# Patient Record
Sex: Male | Born: 1955 | Race: White | Hispanic: No | Marital: Single | Smoking: Never smoker
Health system: Southern US, Community
[De-identification: ages and names within clinical notes are randomized; demographics above are authoritative.]

## PROBLEM LIST (undated history)

## (undated) DIAGNOSIS — G919 Hydrocephalus, unspecified: Secondary | ICD-10-CM

## (undated) DIAGNOSIS — Q423 Congenital absence, atresia and stenosis of anus without fistula: Secondary | ICD-10-CM

## (undated) DIAGNOSIS — F329 Major depressive disorder, single episode, unspecified: Secondary | ICD-10-CM

## (undated) DIAGNOSIS — F32A Depression, unspecified: Secondary | ICD-10-CM

## (undated) DIAGNOSIS — J189 Pneumonia, unspecified organism: Secondary | ICD-10-CM

## (undated) DIAGNOSIS — R011 Cardiac murmur, unspecified: Secondary | ICD-10-CM

## (undated) DIAGNOSIS — Q7649 Other congenital malformations of spine, not associated with scoliosis: Secondary | ICD-10-CM

## (undated) DIAGNOSIS — F419 Anxiety disorder, unspecified: Secondary | ICD-10-CM

## (undated) DIAGNOSIS — Q428 Congenital absence, atresia and stenosis of other parts of large intestine: Secondary | ICD-10-CM

## (undated) DIAGNOSIS — Q421 Congenital absence, atresia and stenosis of rectum without fistula: Secondary | ICD-10-CM

## (undated) DIAGNOSIS — M4120 Other idiopathic scoliosis, site unspecified: Secondary | ICD-10-CM

## (undated) DIAGNOSIS — Z8719 Personal history of other diseases of the digestive system: Secondary | ICD-10-CM

## (undated) DIAGNOSIS — K599 Functional intestinal disorder, unspecified: Secondary | ICD-10-CM

## (undated) HISTORY — DX: Major depressive disorder, single episode, unspecified: F32.9

## (undated) HISTORY — DX: Hydrocephalus, unspecified: G91.9

## (undated) HISTORY — DX: Anxiety disorder, unspecified: F41.9

## (undated) HISTORY — DX: Other congenital malformations of spine, not associated with scoliosis: Q76.49

## (undated) HISTORY — PX: OTHER SURGICAL HISTORY: SHX169

## (undated) HISTORY — PX: TONSILLECTOMY: SUR1361

## (undated) HISTORY — DX: Congenital absence, atresia and stenosis of anus without fistula: Q42.3

## (undated) HISTORY — DX: Congenital absence, atresia and stenosis of rectum without fistula: Q42.1

## (undated) HISTORY — DX: Congenital absence, atresia and stenosis of other parts of large intestine: Q42.8

---

## 1978-05-25 HISTORY — PX: HERNIA REPAIR: SHX51

## 1998-08-14 ENCOUNTER — Emergency Department (HOSPITAL_COMMUNITY): Admission: EM | Admit: 1998-08-14 | Discharge: 1998-08-14 | Payer: Self-pay | Admitting: Emergency Medicine

## 1999-12-21 ENCOUNTER — Encounter: Payer: Self-pay | Admitting: Neurological Surgery

## 1999-12-21 ENCOUNTER — Ambulatory Visit (HOSPITAL_COMMUNITY): Admission: RE | Admit: 1999-12-21 | Discharge: 1999-12-21 | Payer: Self-pay | Admitting: Neurological Surgery

## 1999-12-22 ENCOUNTER — Encounter: Payer: Self-pay | Admitting: Neurological Surgery

## 2000-06-09 ENCOUNTER — Encounter: Payer: Self-pay | Admitting: Orthopedic Surgery

## 2000-06-09 ENCOUNTER — Ambulatory Visit (HOSPITAL_COMMUNITY): Admission: RE | Admit: 2000-06-09 | Discharge: 2000-06-09 | Payer: Self-pay | Admitting: Orthopedic Surgery

## 2000-06-10 ENCOUNTER — Encounter: Payer: Self-pay | Admitting: Orthopedic Surgery

## 2000-06-10 ENCOUNTER — Ambulatory Visit (HOSPITAL_COMMUNITY): Admission: RE | Admit: 2000-06-10 | Discharge: 2000-06-10 | Payer: Self-pay | Admitting: Orthopedic Surgery

## 2000-06-17 ENCOUNTER — Inpatient Hospital Stay (HOSPITAL_COMMUNITY): Admission: EM | Admit: 2000-06-17 | Discharge: 2000-06-19 | Payer: Self-pay | Admitting: Emergency Medicine

## 2000-06-17 ENCOUNTER — Encounter (INDEPENDENT_AMBULATORY_CARE_PROVIDER_SITE_OTHER): Payer: Self-pay | Admitting: *Deleted

## 2000-06-17 ENCOUNTER — Encounter: Payer: Self-pay | Admitting: Internal Medicine

## 2000-06-18 ENCOUNTER — Encounter: Payer: Self-pay | Admitting: Gastroenterology

## 2000-06-19 ENCOUNTER — Encounter: Payer: Self-pay | Admitting: Internal Medicine

## 2000-06-19 ENCOUNTER — Encounter (INDEPENDENT_AMBULATORY_CARE_PROVIDER_SITE_OTHER): Payer: Self-pay | Admitting: *Deleted

## 2002-07-26 ENCOUNTER — Emergency Department (HOSPITAL_COMMUNITY): Admission: EM | Admit: 2002-07-26 | Discharge: 2002-07-26 | Payer: Self-pay | Admitting: Emergency Medicine

## 2003-05-03 ENCOUNTER — Ambulatory Visit (HOSPITAL_COMMUNITY): Admission: RE | Admit: 2003-05-03 | Discharge: 2003-05-03 | Payer: Self-pay | Admitting: *Deleted

## 2003-05-10 ENCOUNTER — Encounter (INDEPENDENT_AMBULATORY_CARE_PROVIDER_SITE_OTHER): Payer: Self-pay | Admitting: *Deleted

## 2003-05-10 ENCOUNTER — Encounter: Admission: RE | Admit: 2003-05-10 | Discharge: 2003-05-10 | Payer: Self-pay | Admitting: *Deleted

## 2003-05-15 ENCOUNTER — Ambulatory Visit (HOSPITAL_COMMUNITY): Admission: RE | Admit: 2003-05-15 | Discharge: 2003-05-15 | Payer: Self-pay | Admitting: *Deleted

## 2003-05-15 ENCOUNTER — Encounter (INDEPENDENT_AMBULATORY_CARE_PROVIDER_SITE_OTHER): Payer: Self-pay | Admitting: *Deleted

## 2003-05-15 HISTORY — PX: UPPER GASTROINTESTINAL ENDOSCOPY: SHX188

## 2004-11-11 ENCOUNTER — Emergency Department (HOSPITAL_COMMUNITY): Admission: EM | Admit: 2004-11-11 | Discharge: 2004-11-11 | Payer: Self-pay | Admitting: Emergency Medicine

## 2006-08-05 ENCOUNTER — Ambulatory Visit: Payer: Self-pay | Admitting: Internal Medicine

## 2006-08-23 ENCOUNTER — Ambulatory Visit (HOSPITAL_COMMUNITY): Admission: RE | Admit: 2006-08-23 | Discharge: 2006-08-23 | Payer: Self-pay | Admitting: Internal Medicine

## 2006-09-16 ENCOUNTER — Ambulatory Visit: Payer: Self-pay | Admitting: Internal Medicine

## 2006-09-28 ENCOUNTER — Observation Stay (HOSPITAL_COMMUNITY): Admission: AD | Admit: 2006-09-28 | Discharge: 2006-09-29 | Payer: Self-pay | Admitting: Cardiology

## 2006-09-28 ENCOUNTER — Encounter (INDEPENDENT_AMBULATORY_CARE_PROVIDER_SITE_OTHER): Payer: Self-pay | Admitting: *Deleted

## 2006-09-29 ENCOUNTER — Encounter (INDEPENDENT_AMBULATORY_CARE_PROVIDER_SITE_OTHER): Payer: Self-pay | Admitting: *Deleted

## 2006-10-05 ENCOUNTER — Ambulatory Visit: Payer: Self-pay | Admitting: Internal Medicine

## 2006-11-08 ENCOUNTER — Encounter (INDEPENDENT_AMBULATORY_CARE_PROVIDER_SITE_OTHER): Payer: Self-pay | Admitting: *Deleted

## 2006-11-08 ENCOUNTER — Inpatient Hospital Stay (HOSPITAL_COMMUNITY): Admission: EM | Admit: 2006-11-08 | Discharge: 2006-11-09 | Payer: Self-pay | Admitting: Internal Medicine

## 2006-11-08 HISTORY — PX: COLONOSCOPY: SHX174

## 2006-11-09 ENCOUNTER — Encounter: Payer: Self-pay | Admitting: Internal Medicine

## 2006-11-09 ENCOUNTER — Encounter (INDEPENDENT_AMBULATORY_CARE_PROVIDER_SITE_OTHER): Payer: Self-pay | Admitting: *Deleted

## 2006-11-15 ENCOUNTER — Ambulatory Visit: Payer: Self-pay | Admitting: Internal Medicine

## 2007-01-20 ENCOUNTER — Ambulatory Visit: Payer: Self-pay | Admitting: Internal Medicine

## 2007-02-14 ENCOUNTER — Ambulatory Visit: Payer: Self-pay | Admitting: Internal Medicine

## 2007-02-14 LAB — CONVERTED CEMR LAB
ALT: 22 units/L (ref 0–53)
AST: 25 units/L (ref 0–37)
Albumin: 4.2 g/dL (ref 3.5–5.2)
Alkaline Phosphatase: 57 units/L (ref 39–117)
BUN: 10 mg/dL (ref 6–23)
Basophils Absolute: 0 10*3/uL (ref 0.0–0.1)
Basophils Relative: 0.6 % (ref 0.0–1.0)
Bilirubin, Direct: 0.1 mg/dL (ref 0.0–0.3)
CO2: 33 meq/L — ABNORMAL HIGH (ref 19–32)
Calcium: 9.7 mg/dL (ref 8.4–10.5)
Chloride: 101 meq/L (ref 96–112)
Creatinine, Ser: 0.9 mg/dL (ref 0.4–1.5)
Eosinophils Absolute: 0.1 10*3/uL (ref 0.0–0.6)
Eosinophils Relative: 2.9 % (ref 0.0–5.0)
GFR calc Af Amer: 115 mL/min
GFR calc non Af Amer: 95 mL/min
Glucose, Bld: 136 mg/dL — ABNORMAL HIGH (ref 70–99)
HCT: 38.2 % — ABNORMAL LOW (ref 39.0–52.0)
Hemoglobin: 13 g/dL (ref 13.0–17.0)
Lymphocytes Relative: 18.5 % (ref 12.0–46.0)
MCHC: 34 g/dL (ref 30.0–36.0)
MCV: 90.6 fL (ref 78.0–100.0)
Monocytes Absolute: 0.2 10*3/uL (ref 0.2–0.7)
Monocytes Relative: 4.6 % (ref 3.0–11.0)
Neutro Abs: 3.2 10*3/uL (ref 1.4–7.7)
Neutrophils Relative %: 73.4 % (ref 43.0–77.0)
Platelets: 281 10*3/uL (ref 150–400)
Potassium: 3.7 meq/L (ref 3.5–5.1)
RBC: 4.21 M/uL — ABNORMAL LOW (ref 4.22–5.81)
RDW: 13 % (ref 11.5–14.6)
Sodium: 141 meq/L (ref 135–145)
Tissue Transglutaminase Ab, IgA: 0.2 units (ref <7)
Total Bilirubin: 0.5 mg/dL (ref 0.3–1.2)
Total Protein: 6.8 g/dL (ref 6.0–8.3)
WBC: 4.3 10*3/uL — ABNORMAL LOW (ref 4.5–10.5)

## 2007-02-17 ENCOUNTER — Encounter: Payer: Self-pay | Admitting: Internal Medicine

## 2007-08-19 DIAGNOSIS — Q421 Congenital absence, atresia and stenosis of rectum without fistula: Secondary | ICD-10-CM

## 2007-08-19 DIAGNOSIS — R159 Full incontinence of feces: Secondary | ICD-10-CM | POA: Insufficient documentation

## 2007-08-19 DIAGNOSIS — Q428 Congenital absence, atresia and stenosis of other parts of large intestine: Secondary | ICD-10-CM

## 2007-08-19 DIAGNOSIS — K279 Peptic ulcer, site unspecified, unspecified as acute or chronic, without hemorrhage or perforation: Secondary | ICD-10-CM | POA: Insufficient documentation

## 2007-08-19 DIAGNOSIS — Z87898 Personal history of other specified conditions: Secondary | ICD-10-CM

## 2007-08-19 DIAGNOSIS — M129 Arthropathy, unspecified: Secondary | ICD-10-CM | POA: Insufficient documentation

## 2007-08-19 DIAGNOSIS — Q423 Congenital absence, atresia and stenosis of anus without fistula: Secondary | ICD-10-CM

## 2007-08-19 HISTORY — DX: Congenital absence, atresia and stenosis of rectum without fistula: Q42.1

## 2008-04-16 ENCOUNTER — Telehealth: Payer: Self-pay | Admitting: Internal Medicine

## 2010-01-22 ENCOUNTER — Telehealth: Payer: Self-pay | Admitting: Internal Medicine

## 2010-01-22 ENCOUNTER — Encounter: Payer: Self-pay | Admitting: Internal Medicine

## 2010-03-14 DIAGNOSIS — K922 Gastrointestinal hemorrhage, unspecified: Secondary | ICD-10-CM | POA: Insufficient documentation

## 2010-03-14 DIAGNOSIS — F32A Depression, unspecified: Secondary | ICD-10-CM

## 2010-03-14 DIAGNOSIS — F419 Anxiety disorder, unspecified: Secondary | ICD-10-CM

## 2010-03-14 DIAGNOSIS — M4120 Other idiopathic scoliosis, site unspecified: Secondary | ICD-10-CM | POA: Insufficient documentation

## 2010-03-14 DIAGNOSIS — F329 Major depressive disorder, single episode, unspecified: Secondary | ICD-10-CM

## 2010-03-14 HISTORY — DX: Anxiety disorder, unspecified: F41.9

## 2010-03-14 HISTORY — DX: Depression, unspecified: F32.A

## 2010-03-20 ENCOUNTER — Ambulatory Visit: Payer: Self-pay | Admitting: Internal Medicine

## 2010-03-21 ENCOUNTER — Telehealth (INDEPENDENT_AMBULATORY_CARE_PROVIDER_SITE_OTHER): Payer: Self-pay | Admitting: *Deleted

## 2010-03-26 ENCOUNTER — Telehealth (INDEPENDENT_AMBULATORY_CARE_PROVIDER_SITE_OTHER): Payer: Self-pay | Admitting: *Deleted

## 2010-06-20 ENCOUNTER — Ambulatory Visit: Admit: 2010-06-20 | Payer: Self-pay | Admitting: Internal Medicine

## 2010-06-26 NOTE — Discharge Summary (Signed)
Summary: GI BLEED                         [facility]  Patient:    [name], [name]                      MRN: [id number] Adm. Date:  [id number] Disc. Date: [id number] Attending:  Vashti Hey                           Discharge Summary  FINAL DIAGNOSES: 1. Acute upper gastrointestinal bleeding from presumed gastric or duodenal    sores, not identified on endoscopy. 2. Headaches, probably due to volume depletion. 3. Anemia due to gastrointestinal bleeding. 4. Tachyarrhythmia due to volume depletion. 5. Idiopathic scoliosis.  HISTORY OF PRESENT ILLNESS:  This 55 year old single white male presented with a two day history of headache, shortness of breath, rapid heartbeat, and black tarry stools.  There was one episode of vomiting of clear liquid without blood.  He had diminished appetite.  He had been using some nonsteroidal anti-inflammatory product, in the form of ibuprofen, two tablets every two days for chronic neck pain and stiffness.  He had no excessive alcohol intake. On presentation his hemoglobin was low at 5.8 g, his stools were positive for occult blood, and he was admitted for further evaluation and treatment.  HOSPITAL COURSE:  The patient was immediately transfused with two units of packed cells.  His hemoglobin rose modestly, and a third unit was administered.  He was mildly orthostatic when he was up and about.  A GI consultation was performed by Dr. Melvia Heaps and associated of [facility].  They concurred with the indication for upper endoscopy. This was accomplished on the morning after admission.  His esophagogastroduodenoscopy was negative, and the consultant recommended a small-bowel follow-through.  This was accomplished on the day of discharge, and likewise was unremarkable.  The consultant recommended that colonoscopy be performed as an outpatient at a later date.  LABORATORY DATA:  Urinalysis was unremarkable.  Specific  gravity 1.010.  His hepatic function panel was essentially within normal limits.  PT and PTT normal.  His blood type was O Rh+ with negative antibody screen.  Initial hemoglobin was reported at 5.8, later rising to 8 after 2 units of packed cells, and up to 9.1 after the third unit.  White cell count was normal.  RDW was elevated at 15.3.  Comprehensive metabolic panel was normal except for slightly low potassium at 3.3, total protein of 5.1, albumin 3.1.  TSH level was normal at 4.32.  EKG showed normal sinus rhythm with a rate of 75 beats per minute on June 17, 2000 at 1200 hours.  His two-view chest film showed no active disease. There was a scoliosis of the thoracolumbar spine with convexity to the left in the mid thoracic spine, pulmonary vascularity was deemed normal.  The small-bowel follow-through x-ray report is not available on the chart at this time.  RECOMMENDATIONS:  The patient was advised to remain on Protonix 40 mg daily. To avoid aspirin and NSAID products.  To follow up in the office in one week or as needed.  He will have a colonoscopy as an outpatient at some point in the near future.  He is to report any further weakness, dizziness, or any signs of black stool.  NOSOCOMIAL INFECTIONS:  None.  CONDITION ON DISCHARGE:  Improved.  DD:  06/22/00 TD:  06/22/00 Job: [id number]

## 2010-06-26 NOTE — Discharge Summary (Signed)
Summary: Fecal incontinence and Constipation  NAME:  [name], [name]               ACCOUNT NO.:  [id number]      MEDICAL RECORD NO.:  [id number]          PATIENT TYPE:  INP      LOCATION:  1615                         FACILITY:  [facility]      PHYSICIAN:  Hedwig Morton. Juanda Chance, MD     DATE OF BIRTH:  [birth year]      DATE OF ADMISSION:  11/08/2006   DATE OF DISCHARGE:  11/09/2006                                  DISCHARGE SUMMARY      ADMITTING DIAGNOSES.:   29. A 55 year old male with fecal incontinence and constipation,       severe.   2. Weight loss.   3. History of imperforate anus surgically corrected as infant.      DISCHARGE DIAGNOSES:   110. A 55 year old male with fecal incontinence and severe obstipation       with colonoscopy showing an incompetent neo-anal sphincter,       otherwise, negative exam.   2. Other diagnoses as listed above.      CONSULTATIONS:  None.      PROCEDURES:  Colonoscopy per Dr. Lina Sar.      BRIEF HISTORY:  [name] is a 55 year old white male with a long history   of colonic inertia, fecal incontinence and progressively more severe   obstipation.  He has had some weight loss which has been questioned   whether this may be self-imposed.  He has history of imperforate anus   which was corrected as an infant.  He was admitted for inpatient prep as   it was felt that he would not prep well at home.      LABORATORY STUDIES:  On admission, WBC of 4.5, hemoglobin 13.9,   hematocrit of 41.6, MCV of 86.5.  Electrolytes within normal limits,   creatinine 1.04.  Liver function studies normal.  __________ was   negative.      HOSPITAL COURSE:  The patient was admitted to the service of Dr. Lina Sar for observation to undergo inpatient bowel prep and then   colonoscopy.  He was prepped with GoLYTELY on the day of admission.   Tolerated the prep well, and then had a colonoscopy on June 17.  This   was felt to be a mediocre prep, was a negative exam.  It did  show some   dilation of the transverse and right colon and an incompetent neo-anal   sphincter.  The patient was discharged to home on June 17 in a stable   condition.  He was to have evaluation at the surgical clinic at [facility] for   bowel incontinence.      MEDICATION REGIMEN:  None.      FOLLOW UP:  Follow up with Dr. Lina Sar as an outpatient.      Dictation taken entirely from the handwritten notes of Dr. Lina Sar.               Amy Mansfield, PA-C               Dora M.  Juanda Chance, MD   Electronically Signed         AE/MEDQ  D:  11/22/2006  T:  11/22/2006  Job:  [id number]      cc:   Janae Bridgeman. Eloise Harman., M.D.   Fax: [phone]

## 2010-06-26 NOTE — Progress Notes (Signed)
Summary: Returned Receipt Received Verifying Letter Delivered to Patient  Returned receipt received verifing delivery of letter. Vara Guardian  March 26, 2010 10:03 AM

## 2010-06-26 NOTE — Letter (Signed)
Summary: New Patient letter  [facility]  [address] [place], [place] [address]   Phone: [phone]  Fax: [phone]       01/22/2010 MRN: [id number]  [name] [address] [place], [place]  [address]  Dear [name],  Welcome to the Gastroenterology Division at [facility].    You are scheduled to see Dr.  Juanda Chance on 03/20/10 at 8:45 a.m.  on the 3rd floor at [facility], [address]  We ask that you try to arrive at our office 15 minutes prior to your appointment time to allow for check-in.  We would like you to complete the enclosed self-administered evaluation form prior to your visit and bring it with you on the day of your appointment.  We will review it with you.  Also, please bring a complete list of all your medications or, if you prefer, bring the medication bottles and we will list them.  Please bring your insurance card so that we may make a copy of it.  If your insurance requires a referral to see a specialist, please bring your referral form from your primary care physician.  Co-payments are due at the time of your visit and may be paid by cash, check or credit card.     Your office visit will consist of a consult with your physician (includes a physical exam), any laboratory testing he/she may order, scheduling of any necessary diagnostic testing (e.g. x-ray, ultrasound, CT-scan), and scheduling of a procedure (e.g. Endoscopy, Colonoscopy) if required.  Please allow enough time on your schedule to allow for any/all of these possibilities.    If you cannot keep your appointment, please call [phone] to cancel or reschedule prior to your appointment date.  This allows [place] the opportunity to schedule an appointment for another patient in need of care.  If you do not cancel or reschedule by 5 p.m. the business day prior to your appointment date, you will be charged a $50.00 late cancellation/no-show fee.    Thank you for choosing  [facility] for your medical needs.  We appreciate the opportunity to care for you.  Please visit [place] at our website  to learn more about our practice.                     Sincerely,                                                             The Gastroenterology Division

## 2010-06-26 NOTE — Progress Notes (Signed)
Summary: Dismissal Letter Sent by Certified Mail  Dismissal Letter sent by certified mail. Vara Guardian  March 21, 2010 9:21 AM

## 2010-06-26 NOTE — Letter (Signed)
Summary: Dr Abbey Chatters Consult Letter  Dr Abbey Chatters Consult Letter   Imported By: Lamona Curl CMA (AAMA) 03/14/2010 17:10:08  _____________________________________________________________________  External Attachment:    Type:   Image     Comment:   External Document

## 2010-06-26 NOTE — Discharge Summary (Signed)
Summary: Colonic Inertia   NAME:  [name], [name]               ACCOUNT NO.:  [id number]   MEDICAL RECORD NO.:  [id number]          PATIENT TYPE:  INP   LOCATION:  6702                         FACILITY:  [facility]   PHYSICIAN:  Hedwig Morton. Juanda Chance, MD     DATE OF BIRTH:  1955-12-11   DATE OF ADMISSION:  09/28/2006  DATE OF DISCHARGE:  09/29/2006                               DISCHARGE SUMMARY   ADMITTING DIAGNOSES:  1. 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  2. Fecal incontinence associated with use of laxatives.  3. The patient born with imperforate anus.  At age of [age] underwent      surgical repair.  4. History of gastrointestinal bleed January 2002.  No source found at      upper endoscopy or small bowel follow-through.  Ibuprofen in use at      that time so question NSAID induced ulcers of non visualized GI      tract.  5. Upper endoscopy in 2004 by Dr. Sabino Gasser for investigation of      abdominal pain and GI bleeding.  Study was unremarkable.  6. Acute blood loss anemia associated with GI bleed in January 2002.      Required transfusion with 3 units packed red blood cells.  7. Idiopathic scoliosis.  8. Status post knee surgery in 1992 and 1994.  9. Status post left inguinal hernia repair in the 1980s.   DISCHARGE DIAGNOSES:  1. Fecal incontinence owing to use of laxatives to treat chronic      constipation associated with colon inertia.  The patient admitted      for colonoscopy prep but ended up refusing to take the prep and was      discharged to home the following morning.  2. Psychiatric illness, formal diagnosis not made, but the patient      would benefit from a psychiatric evaluation and treatment.  3. Weight loss associated with self-imposed anorexia in order to avoid      embarrassing episodes of fecal incontinence.  His albumin is normal      so he is technically not malnourished.   CONSULTATIONS:  None.   PROCEDURE:   None, the patient refused to undergo the prep for the  colonoscopy and, therefore, this study was cancelled.   BRIEF HISTORY:  [name] is a 55 year old white gentleman with  above-noted past medical history.  He has been treated for GI problems  by Dr. Virginia Rochester in the past.  His primary care doctor is Dr. Higinio Plan.  The patient has had lifelong constipation that he is requiring increased  use of laxatives.  Unfortunately the use of laxatives has led to fecal  incontinence when he finally does have a bowel movement.  He sought the  second opinion of Dr. Lina Sar.  He had had a Sitzmarks study in  March of 2008 that was markedly abnormal.  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  The patient has not  been able to undergo colonoscopy as an outpatient before because he has  difficulty with the bowel preparation.  This is both technically  difficult because it takes him awhile to have results from laxatives and  he requires prolonged use of laxatives in order to clean out the stools.  He also has social phobia's associated with incontinence and has not  been successful in prepping at home.  Therefore, Dr. Juanda Chance chose to  admit the patient for an inpatient preparation and colonoscopy.   The patient has been limiting the amount of food that he intakes because  when he eats food he ends up having to have a bowel movement and may or  may not have fecal incontinence.  Associated with this self-imposed food  restriction has been at least a 10-pound weight loss within the last few  months.   LABORATORY DATA:  PT 14.8, INR 1.1, PTT 35.  Sodium 142, potassium 3.7.  Chloride 105, CO2 31.  Glucose 95.  BUN 9, creatinine 0.92.  Total  bilirubin 0.5, alkaline phosphatase 62, AST 24, ALT 14.  Albumin 4.6.  TSH level 1.994, iron 80, total iron binding capacity 366, iron  saturation 22.  Prealbumin 33.1.  Salicylate level less than 4.  Urinalysis negative.   Hemoglobin 15.6, hematocrit 45.5.  MCV 85.8.  White blood cell count 5.8, platelet count 276.  Erythrocyte  sedimentation rate 2.  Acute abdominal series with chest films showed  large amount of fecal matter in the colon.  Undigested markers; one in  the proximal transverse and six in the mid descending colon.   HOSPITAL COURSE:  The patient was admitted in the afternoon of Sep 28, 2006.  Orders were written for his colon preparation and he underwent  lab work as well as abdominal films.  Lab work was normal.  The  abdominal films to no surprise showed lots of fecal matter and still  some retained sitz marks from the Sitzmarks study of August 23, 2006.   That evening the patient became a bit agitated.  He took one cup of prep  and then refused the rest.  He wanted to go home but was willing to stay  in the morning and discuss options with Dr. Juanda Chance.  That morning Dr.  Juanda Chance discussed the case with the patient.  The patient was uncertain  of what he wanted.  He asked if he could be discharged.  He was advised  by Dr. Juanda Chance that given the fact that he has refused to cooperate with  the attempt at colonoscopy, that he will be discharged from the [facility]  GI practice.  He understands this.  Follow-up will be with Dr. Lendell Caprice.   Dr. Juanda Chance did advise the patient that he should ask Dr. Lendell Caprice for  referral to a mental health professional.  She feels that he would  greatly benefit from counseling as well as perhaps appropriate  medications.   The patient may in the future be best served by an ostomy which would  resolve his problems of fecal incontinence.  However, the patient was  not in any state to agree to surgical evaluation and he certainly would  need colonoscopic evaluation before surgical referral could be  entertained.   CONDITION ON DISCHARGE:  Stable medically but psychiatrically anxious,  but of no threat to himself or others.  MEDICATIONS AT DISCHARGE:  None, he was  taking no medications at the  time of admission having stopped laxatives about 2 weeks prior  to this  admission.      Jennye Moccasin, PA-C      Hedwig Morton. Juanda Chance, MD  Electronically Signed    SG/MEDQ  D:  09/29/2006  T:  09/29/2006  Job:  [id number]   cc:   Hedwig Morton. Juanda Chance, MD  Janae Bridgeman Eloise Harman., M.D.

## 2010-06-26 NOTE — Op Note (Signed)
Summary: EGD  NAME:  [name], [name]                         ACCOUNT NO.:  [id number]   MEDICAL RECORD NO.:  [id number]                   PATIENT TYPE:  AMB   LOCATION:  ENDO                                 FACILITY:  [facility]   PHYSICIAN:  [name], M.D.                 DATE OF BIRTH:  03/29/1956   DATE OF PROCEDURE:  DATE OF DISCHARGE:                                 OPERATIVE REPORT   PROCEDURE:  Upper endoscopy.   INDICATIONS FOR PROCEDURE:  GI bleeding, abdominal pain.   ANESTHESIA:  Demerol 60, Versed 6 mg.   DESCRIPTION OF PROCEDURE:  With the patient mildly sedated in the left  lateral decubitus position, the Olympus videoscopic endoscope was inserted  in the mouth and passed under direct vision through the esophagus which  appeared normal into the stomach. The fundus, body, antrum, duodenal bulb  and second portion of the duodenum appeared normal. From this point, the  endoscope was slowly withdrawn taking circumferential views of the duodenal  mucosa until the endoscope was then pulled back in the stomach, placed in  retroflexion to view the stomach from below. The endoscope was then  straightened and withdrawn taking circumferential views of the remaining  gastric and esophageal mucosa. The patient's vital signs and pulse oximeter  remained stable. The patient tolerated the procedure well without apparent  complications.   FINDINGS:  Unremarkable endoscopic examination.   PLAN:  Have the patient followup with me.  We have ordered a chloride sweat  test and will make sure it gets done and have the patient followup  subsequent to that.                                               [name], M.D.    GMO/MEDQ  D:  05/15/2003  T:  05/16/2003  Job:  [id number]

## 2010-06-26 NOTE — Letter (Signed)
Summary: GASTROENTEROLOGY Discharge Letter  [facility]  [address] [place], [place] [address]   Phone: [phone]  Fax: [phone]            03/20/2010 MRN: [id number]  [name] [address] [place], [place]  [address]  Dear [name],   I find it necessary to inform you that I am no longer able to provide medical care to you due to your failure to show for multiple office appointments. As you know, you were previously discharged from our practice due to noncompliance with the suggested medical regimen. Your continued negative behavior is confirmation that I will be unable to have a positive physician/patient relationship with you. There will be no reversal of this decision.  Since your condition requires medical attention, I suggest that you place yourself under the care of another physician without delay. If you desire, I will be available for emergency care for 30 days after you receive this letter.  This should give you ample time to select a physician of your choice from the many competent providers in this area. You may want to call the local medical society or [facility]'s physician referral service [phone]) for their assistance in locating a new physician. With your written authorization, I will make a copy of your medical record available to your new physician.   Sincerely,    Hedwig Morton. Juanda Chance

## 2010-06-26 NOTE — Progress Notes (Signed)
Summary: Crohns flare up   Phone Note Call from Patient Call back at Home Phone [phone]   Call For: DR BRODIE Summary of Call: Is there something over the counter he can take for his Crohns flare up? Initial call taken by: Leanor Kail [facility],  January 22, 2010 1:40 PM  Follow-up for Phone Call        Patient  is asking for OTC recommendations for crohn's disease.  I advised the patient according to our records we have no dx of crohn's disease.  He reports abdominal discomfort.  Patient  is scheduled for an office visit with Dr Juanda Chance for 03/20/10 to discuss.  I will send him a new patient letter. Follow-up by: Darcey Nora RN, CGRN,  January 22, 2010 1:56 PM

## 2010-07-02 ENCOUNTER — Other Ambulatory Visit: Payer: Self-pay | Admitting: Internal Medicine

## 2010-07-02 ENCOUNTER — Encounter (INDEPENDENT_AMBULATORY_CARE_PROVIDER_SITE_OTHER): Payer: PRIVATE HEALTH INSURANCE | Admitting: Internal Medicine

## 2010-07-02 ENCOUNTER — Encounter: Payer: Self-pay | Admitting: Internal Medicine

## 2010-07-02 DIAGNOSIS — R0602 Shortness of breath: Secondary | ICD-10-CM | POA: Insufficient documentation

## 2010-07-02 DIAGNOSIS — R5383 Other fatigue: Secondary | ICD-10-CM

## 2010-07-02 DIAGNOSIS — R5381 Other malaise: Secondary | ICD-10-CM

## 2010-07-03 LAB — BASIC METABOLIC PANEL
BUN: 11 mg/dL (ref 6–23)
CO2: 31 mEq/L (ref 19–32)
Calcium: 9.4 mg/dL (ref 8.4–10.5)
Chloride: 99 mEq/L (ref 96–112)
Creatinine, Ser: 0.9 mg/dL (ref 0.4–1.5)
GFR: 94.85 mL/min (ref >60.00)
Glucose, Bld: 93 mg/dL (ref 70–99)
Potassium: 4 mEq/L (ref 3.5–5.1)
Sodium: 137 mEq/L (ref 135–145)

## 2010-07-03 LAB — CBC WITH DIFFERENTIAL/PLATELET
Basophils Absolute: 0 10*3/uL (ref 0.0–0.1)
Basophils Relative: 1.2 % (ref 0.0–3.0)
Eosinophils Absolute: 0.2 10*3/uL (ref 0.0–0.7)
Eosinophils Relative: 5.4 % — ABNORMAL HIGH (ref 0.0–5.0)
HCT: 44 % (ref 39.0–52.0)
Hemoglobin: 15 g/dL (ref 13.0–17.0)
Lymphocytes Relative: 55.7 % — ABNORMAL HIGH (ref 12.0–46.0)
Lymphs Abs: 1.7 10*3/uL (ref 0.7–4.0)
MCHC: 34.1 g/dL (ref 30.0–36.0)
MCV: 86.8 fl (ref 78.0–100.0)
Monocytes Absolute: 0.5 10*3/uL (ref 0.1–1.0)
Monocytes Relative: 15.2 % — ABNORMAL HIGH (ref 3.0–12.0)
Neutro Abs: 0.7 10*3/uL — ABNORMAL LOW (ref 1.4–7.7)
Neutrophils Relative %: 22.5 % — ABNORMAL LOW (ref 43.0–77.0)
Platelets: 241 10*3/uL (ref 150.0–400.0)
RBC: 5.07 Mil/uL (ref 4.22–5.81)
RDW: 13.9 % (ref 11.5–14.6)
WBC: 3 10*3/uL — ABNORMAL LOW (ref 4.5–10.5)

## 2010-07-03 LAB — HEPATIC FUNCTION PANEL
ALT: 20 U/L (ref 0–53)
AST: 28 U/L (ref 0–37)
Albumin: 4.3 g/dL (ref 3.5–5.2)
Alkaline Phosphatase: 56 U/L (ref 39–117)
Bilirubin, Direct: 0 mg/dL (ref 0.0–0.3)
Total Bilirubin: 0.2 mg/dL — ABNORMAL LOW (ref 0.3–1.2)
Total Protein: 6.9 g/dL (ref 6.0–8.3)

## 2010-07-03 LAB — TSH: TSH: 2.52 u[IU]/mL (ref 0.35–5.50)

## 2010-07-10 NOTE — Assessment & Plan Note (Signed)
Summary: new to est/weakness/ok per danielle/kn--called on 1/27 at 10:...   Vital Signs:  Patient profile:   55 year old male Height:      66.75 inches Weight:      131.38 pounds BMI:     20.81 Pulse rate:   62 / minute Pulse rhythm:   regular BP sitting:   120 / 64  (left arm) Cuff size:   regular  Vitals Entered By: Army Fossa CMA (July 02, 2010 3:08 PM) CC: New to est. Comments Had in infection in mouth was on an ATB- since then his energy level has been down. [facility]   History of Present Illness: new patient   He developed a dental infection, a tooth was pulled 2 weeks ago he was prescribed amoxicillin and he is doing better. Complaint today is that since the infection is just not back to normal. He feels slightly lightheaded and very fatigued. Fatigue is his main concern, review of systems essentially negative except for very mild difficulty breathing on and off, not necessarily related to exertion.  ROS No fevers occ. nausea, no vomiting or diarrhea No cough No chest pain or palpitation No weight loss as far as the dental infx mouth swelling he is much better, pain is essentially resolved   Preventive Screening-Counseling & Management  Alcohol-Tobacco     Alcohol type: occasionally     Smoking Status: never  Caffeine-Diet-Exercise     Does Patient Exercise: yes  Current Medications (verified): 1)  B-50  Tabs (Vitamins-Lipotropics) 2)  Iron Supplement 325 (65 Fe) Mg Tabs (Ferrous Sulfate)  Allergies (verified): No Known Drug Allergies  Past History:  Past Medical History:  PSYCHIATRIC DISORDER-- depression  FECAL INCONTINENCE (ICD-787.6) PUD (ICD-533.90) IMPERFORATE ANUS (ICD-751.2) INGUINAL HERNIA, HX OF (ICD-V13.8)  Past Surgical History: Rectal surgery-infant Inguinal Hernia Surgery Lt. 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 Family History of Diabetes: Mother Family History of Heart Disease: Maternal Grandfather Family History of Breast Cancer: Mother No FH of Colon Cancer: Family History High cholesterol Family History Hypertension  Social History: single no children tobacco-- never Illicit Drug Use - no Alcohol use--sometimes  occupation-- [place] Does Patient Exercise:  yes Smoking Status:  never  Physical Exam  General:  alert and well-developed.  no apparent distress Head:  face is symmetric Mouth:  very poor dentition without obvious abscess Neck:  no mass or lymphadenopathy Lungs:  normal respiratory effort, no intercostal retractions, no accessory muscle use, and normal breath sounds.   Heart:  normal rate, regular rhythm, and no murmur.   Abdomen:  soft, non-tender, no distention, and no masses.   Extremities:  no edema   Impression & Recommendations:  Problem # 1:  FATIGUE (ICD-780.79)  presents with fatigue, "not feeling back to normal" since he had a dental infection a couple weeks ago. The dental infection seems better Review of systems positive for only mild dyspnea. EKG today showed no specific changes Plan: labs, chest x-ray Reassess in 3 weeks he will call sooner if symptoms increase  Orders: Venipuncture ([address]) TLB-BMP (Basic Metabolic Panel-BMET) (80048-METABOL) TLB-CBC Platelet - w/Differential (85025-CBCD) TLB-TSH (Thyroid Stimulating Hormone) (84443-TSH) TLB-Hepatic/Liver Function Pnl (80076-HEPATIC) Specimen Handling (60454) EKG w/ Interpretation (93000)  Complete Medication List: 1)  B-50 Tabs (Vitamins-lipotropics) 2)  Iron  Supplement 325 (65 Fe) Mg Tabs (Ferrous sulfate)  Other Orders: T-2 View CXR (71020TC)  Patient Instructions: 1)  Please schedule a follow-up appointment in 3  weeks.    Orders  Added: 1)  Venipuncture [36415] 2)  TLB-BMP (Basic Metabolic Panel-BMET) [80048-METABOL] 3)  TLB-CBC Platelet - w/Differential [85025-CBCD] 4)  TLB-TSH (Thyroid Stimulating Hormone) [84443-TSH] 5)  TLB-Hepatic/Liver Function Pnl [80076-HEPATIC] 6)  T-2 View CXR [71020TC] 7)  Specimen Handling [99000] 8)  New Patient Level II [99202] 9)  EKG w/ Interpretation [93000]     Risk Factors:  Tobacco use:  never Alcohol use:  yes    Type:  occasionally Exercise:  yes

## 2010-07-23 ENCOUNTER — Telehealth: Payer: Self-pay | Admitting: Internal Medicine

## 2010-07-31 NOTE — Letter (Signed)
Summary: Dismissal Activation Form & Return Reciept  Dismissal Activation Form & Return Reciept   Imported By: Maryln Gottron 07/25/2010 15:01:10  _____________________________________________________________________  External Attachment:    Type:   Image     Comment:   External Document

## 2010-08-05 NOTE — Progress Notes (Signed)
  Phone Note Outgoing Call   Summary of Call: called to check on patient Additionally EKG was discussed with cardiology, there  is the st elevation in lead V1 , they rec that  if he has symptoms concerning for  brugada syndrome, ie syncope or FHx of SCD , he will need a referal [facility] Jose E. Paz MD  July 23, 2010 1:16 PM    Follow-up for Phone Call         please call the patient again.  We need to know if he ever had syncope or any family history of "sudden death". Jose E. Paz MD  2010-08-29 10:24 AM   Additional Follow-up for Phone Call Additional follow up Details #1::        Left message for pt to call back. Army Fossa CMA  August 29, 2010 11:00 AM     Additional Follow-up for Phone Call Additional follow up Details #2::    I spoke w/ pt he states there is no history of synocope w/ him and no family history of sudden death. Army Fossa CMA  Aug 29, 2010 2:04 PM   Additional Follow-up for Phone Call Additional follow up Details #3:: Details for Additional Follow-up Action Taken: thx Jose E. Paz MD  08-29-10 4:02 PM

## 2010-10-07 NOTE — Letter (Signed)
January 20, 2007    Adolph Pollack, M.D.  [address], [address]  [place], [place] [address]   RE:  [name]  MRN:  [id number]  /  DOB:  1956/03/02   Dear Tawanna Cooler:   With a lot of thought, I decided to refer the patient to you for  possible diverting colostomy.  He is a quite complex patient and your  opinion will be very valuable to me.  His name is [name] and he  is a 55 year old white male who transferred his care from [name],  M.D.  I have known him since spring of this year.  He was born with  imperforate anus in 1957 and his whole life has suffered from  constipation and obstipation.  This was documented on his Sitzmarks  study, which I had done in March 2008.  He also has incontinent diarrhea  as a result of imperforate anus, which partially prolapses when he walks  or when he sits.  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  His problem is weight loss.  He manages his bowel  habits by reducing hisfood intake.  He basically lives on lean [place],  white toast or rice, which does not seem to give him diarrhea and  incontinence.  I am not sure that he has actually true diarrhea.  It is  mostly an incontinence.  His weight has progressively decreased from 129  pounds when I met him this spring to 107 pounds.  I had him in the  hospital for general evaluation on June 16 to November 09, 2006.  His  colonoscopy showed mildly dilated colon with decent prep.  No rectal  sphincter tone,the colon  mucosa was actually normal, the lumen was  slightly dilated like in a colonic inertia.  We tried to do a barium  enema, but he could not tolerate it.  He had a small bowel follow  through several years ago, which was normal with normal transit time and  suboptimal visualization of the terminal ileum.  He has a strange  personality.  It may be due to the lifelong problems with his GI tract,  but he is a recluse and often does not show up for his  appointments.  He  has signed out AMA a couple of times and I had to really talk to him a  lot to establish a relationship with him., but I think he needs help.  He would be probably best served by diverting colostomy so he can  control his bowel habits.  The last blood test showed that his serum  albumin level was normal at 4.5, but that was before he lost an  additional 20 pounds.  He looks very cachectic.  I have mentioned  colostomy on several occasions, it would not be a guarantee of him  getting completely well, but he will now consider surgical consultation.  Goerge did an EGD in 2003, which was normal, but no small bowl biopsies.  I am checking his t-TG levels for sprue. I have not made an appointment  for him yet, but will send You some records.   MEDICATIONS:  1. Lomotil 2-4 a day.  2. Multiple vitamins.   I have had several long talks with him concerning long-term solution of  his problem.  He now agrees to fully cooperate and talk, and consider  diverting colostomy.  I think it would be the  best thing for him.  Before I make an appointment for him, I would like to send you his  records for review and see whether  surgery would be a consideration.  Thank you very much for assistance with this man.    Sincerely,      Hedwig Morton. Juanda Chance, MD  Electronically Signed    DMB/MedQ  DD: 01/20/2007  DT: 01/20/2007  Job #: [id number]

## 2010-10-10 NOTE — Discharge Summary (Signed)
[name], [name]               ACCOUNT NO.:  [id number]   MEDICAL RECORD NO.:  [id number]          PATIENT TYPE:  INP   LOCATION:  1615                         FACILITY:  [facility]   PHYSICIAN:  Hedwig Morton. Juanda Chance, MD     DATE OF BIRTH:  Oct 18, 1955   DATE OF ADMISSION:  11/08/2006  DATE OF DISCHARGE:  11/09/2006                               DISCHARGE SUMMARY   ADMITTING DIAGNOSES.:  82. A 55 year old male with fecal incontinence and constipation,      severe.  2. Weight loss.  3. History of imperforate anus surgically corrected as infant.   DISCHARGE DIAGNOSES:  64. A 55 year old male with fecal incontinence and severe obstipation      with colonoscopy showing an incompetent neo-anal sphincter,      otherwise, negative exam.  2. Other diagnoses as listed above.   CONSULTATIONS:  None.   PROCEDURES:  Colonoscopy per Dr. Lina Sar.   BRIEF HISTORY:  [name] is a 55 year old white male with a long history  of colonic inertia, fecal incontinence and progressively more severe  obstipation.  He has had some weight loss which has been questioned  whether this may be self-imposed.  He has history of imperforate anus  which was corrected as an infant.  He was admitted for inpatient prep as  it was felt that he would not prep well at home.   LABORATORY STUDIES:  On admission, WBC of 4.5, hemoglobin 13.9,  hematocrit of 41.6, MCV of 86.5.  Electrolytes within normal limits,  creatinine 1.04.  Liver function studies normal.  __________ was  negative.   HOSPITAL COURSE:  The patient was admitted to the service of Dr. Lina Sar for observation to undergo inpatient bowel prep and then  colonoscopy.  He was prepped with GoLYTELY on the day of admission.  Tolerated the prep well, and then had a colonoscopy on June 17.  This  was felt to be a mediocre prep, was a negative exam.  It did show some  dilation of the transverse and right colon and an incompetent neo-anal  sphincter.  The patient  was discharged to home on June 17 in a stable  condition.  He was to have evaluation at the surgical clinic at [facility] for  bowel incontinence.   MEDICATION REGIMEN:  None.   FOLLOW UP:  Follow up with Dr. Lina Sar as an outpatient.   Dictation taken entirely from the handwritten notes of Dr. Lina Sar.      Amy [place], PA-C      Dora M. Juanda Chance, MD  Electronically Signed    AE/MEDQ  D:  11/22/2006  T:  11/22/2006  Job:  [id number]   cc:   Janae Bridgeman. Eloise Harman., M.D.  Fax: [phone]

## 2010-10-10 NOTE — Discharge Summary (Signed)
[name], [name]               ACCOUNT NO.:  [id number]   MEDICAL RECORD NO.:  [id number]          PATIENT TYPE:  INP   LOCATION:  6702                         FACILITY:  [facility]   PHYSICIAN:  Hedwig Morton. Juanda Chance, MD     DATE OF BIRTH:  06-27-1955   DATE OF ADMISSION:  09/28/2006  DATE OF DISCHARGE:  09/29/2006                               DISCHARGE SUMMARY   ADMITTING DIAGNOSES:  1. 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  2. Fecal incontinence associated with use of laxatives.  3. The patient born with imperforate anus.  At age of [age] underwent      surgical repair.  4. History of gastrointestinal bleed January 2002.  No source found at      upper endoscopy or small bowel follow-through.  Ibuprofen in use at      that time so question NSAID induced ulcers of non visualized GI      tract.  5. Upper endoscopy in 2004 by Dr. Sabino Gasser for investigation of      abdominal pain and GI bleeding.  Study was unremarkable.  6. Acute blood loss anemia associated with GI bleed in January 2002.      Required transfusion with 3 units packed red blood cells.  7. Idiopathic scoliosis.  8. Status post knee surgery in 1992 and 1994.  9. Status post left inguinal hernia repair in the 1980s.   DISCHARGE DIAGNOSES:  1. Fecal incontinence owing to use of laxatives to treat chronic      constipation associated with colon inertia.  The patient admitted      for colonoscopy prep but ended up refusing to take the prep and was      discharged to home the following morning.  2. Psychiatric illness, formal diagnosis not made, but the patient      would benefit from a psychiatric evaluation and treatment.  3. Weight loss associated with self-imposed anorexia in order to avoid      embarrassing episodes of fecal incontinence.  His albumin is normal      so he is technically not malnourished.   CONSULTATIONS:  None.   PROCEDURE:  None, the patient refused  to undergo the prep for the  colonoscopy and, therefore, this study was cancelled.   BRIEF HISTORY:  [name] is a 55 year old white gentleman with  above-noted past medical history.  He has been treated for GI problems  by Dr. Virginia Rochester in the past.  His primary care doctor is Dr. Higinio Plan.  The patient has had lifelong constipation that he is requiring increased  use of laxatives.  Unfortunately the use of laxatives has led to fecal  incontinence when he finally does have a bowel movement.  He sought the  second opinion of Dr. Lina Sar.  He had had a Sitzmarks study in  March of 2008 that was markedly abnormal.  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  The patient has not  been able to undergo colonoscopy  as an outpatient before because he has  difficulty with the bowel preparation.  This is both technically  difficult because it takes him awhile to have results from laxatives and  he requires prolonged use of laxatives in order to clean out the stools.  He also has social phobia's associated with incontinence and has not  been successful in prepping at home.  Therefore, Dr. Juanda Chance chose to  admit the patient for an inpatient preparation and colonoscopy.   The patient has been limiting the amount of food that he intakes because  when he eats food he ends up having to have a bowel movement and may or  may not have fecal incontinence.  Associated with this self-imposed food  restriction has been at least a 10-pound weight loss within the last few  months.   LABORATORY DATA:  PT 14.8, INR 1.1, PTT 35.  Sodium 142, potassium 3.7.  Chloride 105, CO2 31.  Glucose 95.  BUN 9, creatinine 0.92.  Total  bilirubin 0.5, alkaline phosphatase 62, AST 24, ALT 14.  Albumin 4.6.  TSH level 1.994, iron 80, total iron binding capacity 366, iron  saturation 22.  Prealbumin 33.1.  Salicylate level less than 4.  Urinalysis negative.  Hemoglobin 15.6,  hematocrit 45.5.  MCV 85.8.  White blood cell count 5.8, platelet count 276.  Erythrocyte  sedimentation rate 2.  Acute abdominal series with chest films showed  large amount of fecal matter in the colon.  Undigested markers; one in  the proximal transverse and six in the mid descending colon.   HOSPITAL COURSE:  The patient was admitted in the afternoon of Sep 28, 2006.  Orders were written for his colon preparation and he underwent  lab work as well as abdominal films.  Lab work was normal.  The  abdominal films to no surprise showed lots of fecal matter and still  some retained sitz marks from the Sitzmarks study of August 23, 2006.   That evening the patient became a bit agitated.  He took one cup of prep  and then refused the rest.  He wanted to go home but was willing to stay  in the morning and discuss options with Dr. Juanda Chance.  That morning Dr.  Juanda Chance discussed the case with the patient.  The patient was uncertain  of what he wanted.  He asked if he could be discharged.  He was advised  by Dr. Juanda Chance that given the fact that he has refused to cooperate with  the attempt at colonoscopy, that he will be discharged from the [facility]  GI practice.  He understands this.  Follow-up will be with Dr. Lendell Caprice.   Dr. Juanda Chance did advise the patient that he should ask Dr. Lendell Caprice for  referral to a mental health professional.  She feels that he would  greatly benefit from counseling as well as perhaps appropriate  medications.   The patient may in the future be best served by an ostomy which would  resolve his problems of fecal incontinence.  However, the patient was  not in any state to agree to surgical evaluation and he certainly would  need colonoscopic evaluation before surgical referral could be  entertained.   CONDITION ON DISCHARGE:  Stable medically but psychiatrically anxious,  but of no threat to himself or others.  MEDICATIONS AT DISCHARGE:  None, he was taking no  medications at the  time of admission having stopped laxatives about 2 weeks prior to this  admission.  Jennye Moccasin, PA-C      Hedwig Morton. Juanda Chance, MD  Electronically Signed    SG/MEDQ  D:  09/29/2006  T:  09/29/2006  Job:  [id number]   cc:   Hedwig Morton. Juanda Chance, MD  Janae Bridgeman Eloise Harman., M.D.

## 2010-10-10 NOTE — Op Note (Signed)
NAME:  [name], [name]                         ACCOUNT NO.:  [id number]   MEDICAL RECORD NO.:  [id number]                   PATIENT TYPE:  AMB   LOCATION:  ENDO                                 FACILITY:  [facility]   PHYSICIAN:  [name], M.D.                 DATE OF BIRTH:  Dec 29, 1955   DATE OF PROCEDURE:  DATE OF DISCHARGE:                                 OPERATIVE REPORT   PROCEDURE:  Upper endoscopy.   INDICATIONS FOR PROCEDURE:  GI bleeding, abdominal pain.   ANESTHESIA:  Demerol 60, Versed 6 mg.   DESCRIPTION OF PROCEDURE:  With the patient mildly sedated in the left  lateral decubitus position, the Olympus videoscopic endoscope was inserted  in the mouth and passed under direct vision through the esophagus which  appeared normal into the stomach. The fundus, body, antrum, duodenal bulb  and second portion of the duodenum appeared normal. From this point, the  endoscope was slowly withdrawn taking circumferential views of the duodenal  mucosa until the endoscope was then pulled back in the stomach, placed in  retroflexion to view the stomach from below. The endoscope was then  straightened and withdrawn taking circumferential views of the remaining  gastric and esophageal mucosa. The patient's vital signs and pulse oximeter  remained stable. The patient tolerated the procedure well without apparent  complications.   FINDINGS:  Unremarkable endoscopic examination.   PLAN:  Have the patient followup with me.  We have ordered a chloride sweat  test and will make sure it gets done and have the patient followup  subsequent to that.                                               [name], M.D.    GMO/MEDQ  D:  05/15/2003  T:  05/16/2003  Job:  [id number]

## 2010-10-10 NOTE — Discharge Summary (Signed)
[facility]  Patient:    [name], [name]                      MRN: [id number] Adm. Date:  [id number] Disc. Date: [id number] Attending:  Vashti Hey                           Discharge Summary  FINAL DIAGNOSES: 1. Acute upper gastrointestinal bleeding from presumed gastric or duodenal    sores, not identified on endoscopy. 2. Headaches, probably due to volume depletion. 3. Anemia due to gastrointestinal bleeding. 4. Tachyarrhythmia due to volume depletion. 5. Idiopathic scoliosis.  HISTORY OF PRESENT ILLNESS:  This 55 year old single white male presented with a two day history of headache, shortness of breath, rapid heartbeat, and black tarry stools.  There was one episode of vomiting of clear liquid without blood.  He had diminished appetite.  He had been using some nonsteroidal anti-inflammatory product, in the form of ibuprofen, two tablets every two days for chronic neck pain and stiffness.  He had no excessive alcohol intake. On presentation his hemoglobin was low at 5.8 g, his stools were positive for occult blood, and he was admitted for further evaluation and treatment.  HOSPITAL COURSE:  The patient was immediately transfused with two units of packed cells.  His hemoglobin rose modestly, and a third unit was administered.  He was mildly orthostatic when he was up and about.  A GI consultation was performed by Dr. Melvia Heaps and associated of [facility].  They concurred with the indication for upper endoscopy. This was accomplished on the morning after admission.  His esophagogastroduodenoscopy was negative, and the consultant recommended a small-bowel follow-through.  This was accomplished on the day of discharge, and likewise was unremarkable.  The consultant recommended that colonoscopy be performed as an outpatient at a later date.  LABORATORY DATA:  Urinalysis was unremarkable.  Specific gravity 1.010.   His hepatic function panel was essentially within normal limits.  PT and PTT normal.  His blood type was O Rh+ with negative antibody screen.  Initial hemoglobin was reported at 5.8, later rising to 8 after 2 units of packed cells, and up to 9.1 after the third unit.  White cell count was normal.  RDW was elevated at 15.3.  Comprehensive metabolic panel was normal except for slightly low potassium at 3.3, total protein of 5.1, albumin 3.1.  TSH level was normal at 4.32.  EKG showed normal sinus rhythm with a rate of 75 beats per minute on June 17, 2000 at 1200 hours.  His two-view chest film showed no active disease. There was a scoliosis of the thoracolumbar spine with convexity to the left in the mid thoracic spine, pulmonary vascularity was deemed normal.  The small-bowel follow-through x-ray report is not available on the chart at this time.  RECOMMENDATIONS:  The patient was advised to remain on Protonix 40 mg daily. To avoid aspirin and NSAID products.  To follow up in the office in one week or as needed.  He will have a colonoscopy as an outpatient at some point in the near future.  He is to report any further weakness, dizziness, or any signs of black stool.  NOSOCOMIAL INFECTIONS:  None.  CONDITION ON DISCHARGE:  Improved. DD:  06/22/00 TD:  06/22/00 Job: [id number]

## 2010-10-10 NOTE — Assessment & Plan Note (Signed)
[facility]                         GASTROENTEROLOGY OFFICE NOTE   [name], [name]                      MRN:          [id number]  DATE:08/05/2006                            DOB:          Sep 28, 1955    [name] is a very nice 55 year old patient of Dr. Lendell Caprice, who is  here today for evaluation of progressive constipation.  He has been seen  by Dr. Virginia Rochester, but wanted to switch to get another opinion.  [name] was  born with imperforate anus and underwent repair of imperforate anus at  age of 2 days as a newborn at [facility]. According to him, he was  having chronic  constipation  as a child and was taking laxative.  Then,  for several years, he was able to regulate his bowl movements by taking  a lot of fiber.  In the last several years, the fiber really has not  helped, in fact the fiber has made him more constipated.  He denies any  rectal bleeding.  He lives by himself and eats 3 times a day, but mostly  [place] sandwiches and soups.  His weight is 10 pounds below his usual  weight of 140 pounds.  He has a history of, peptic ulcer disease and  underwent upper endoscopy by Dr. Virginia Rochester, as well as Dr. Arlyce Dice in 2002 and  2004 with no definite findings.  He is currently on no medications.  He  cannot, according to him, have a spontaneous bowel movement.  His usual  bowel habits would be 1 bowel movement daily.  He takes Banker of a stool softener 2 every 3 days with resulting good bowel  movement the next day.  Because of the imperforate anus, he has problems  with incontinence when he has diarrhea.  The most of the problem with  constipation is centered around inability to evacuate.  He has the urge  to go, but cannot evacuate  thestool.  He has not use any suppositories  or enemas.   MEDICATIONS:  None.   PAST HISTORY:  Significant for knee surgery in 1992 and 1994.  Repair of  imperforate anus as an infant.  Hernia surgery  20 years ago.   FAMILY HISTORY:  Positive for heart disease in maternal grandfather,  breast cancer in mother, diabetes in mother.   SOCIAL HISTORY:  Single.  Does not smoke.  Does not drink.   REVIEW OF SYSTEMS:  No specific complaints.  Anemia in 2007.   PHYSICAL EXAM:  Blood pressure 100/70, pulse 68, and weight 129 pounds.  He appeared rather thin.  Alert and oriented, cooperative.  Sclerae not icteric.  Oral cavity was normal.  No cheilosis.  Tongue was  papillated.  LUNGS:  Clear to auscultation.  COR:  Normal S1, S2.  ABDOMEN:  Soft but he has voluntary guarding.  Somewhat hyperactive  bowel sounds.  No tenderness.  RECTAL:  There was absent rectal sphincter.  Colonic mucosa was brought  up to a colostomy-like appearance.  It was soft.  Somewhat tender, but  no muscular contraction.  Stool was impacted,  dark, solid, and was heme-  negative.   IMPRESSION:  1. A 55 year old white male with chronic constipation.  2. History of imperforate anus corrected as an infant with resulting      absent rectal tone and suspected abnormal rectal motility.  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   PLAN:  1. Sitzmarks.  We will assess the transit time through the colon.  The      patient will abstain from taking laxatives during the 5 days of      getting his Sitzmarks study.  2. After that, he can start MiraLax 17 g daily.  He apparently took it      in the past, but not on a daily basis.  3. I will see him in 4 weeks.  4. I encourage the patient to eat more.  He is about 10 pounds under      his usual weight, and really is not eating enough fiber or food in      general.  He will eventually need colonoscopy since he has never      had one, but he will be difficult to clean out because he is right      now   impacted.  We will have to use MiraLax for a while before we      can get his colon prepped  enough for colonoscopy.     Hedwig Morton. Juanda Chance, MD  Electronically Signed    DMB/MedQ  DD: 08/05/2006  DT: 08/06/2006  Job #: [id number]   cc:   [name]., M.D.

## 2010-10-10 NOTE — Assessment & Plan Note (Signed)
[facility]                                 ON-CALL NOTE   STALEY, LUNZ                        MRN:          [id number]  DATE:10/18/2006                            DOB:          April 06, 1956    [name] called today at 9 a.m.  He tells me that Dr. Juanda Chance left a  message with him to call back today.  Looking over his history in  echart, I find that Dr. Juanda Chance recently dictated a discharge letter to  him.  I do not believe that he has received that.  I actually did not  bring this up with him, but told him to call the office tomorrow as Dr.  Juanda Chance will be back in then after this holiday weekend.     Rachael Fee, MD  Electronically Signed    DPJ/MedQ  DD: 10/18/2006  DT: 10/18/2006  Job #: [id number]   cc:   Hedwig Morton. Juanda Chance, MD

## 2010-10-10 NOTE — Letter (Signed)
Oct 11, 2006    [name]  [address], [place] [address]   RE:  [name], [name]  MRN:  [id number]  /  DOB:  1955-06-11   Dear Mr. [name],   I am sorry to inform you that I will no longer be able to take care of  your gastrointestinal problems. As you know, I have seen you in the  office several times and we made a mutual decision to admit you to the  hospital for a colonoscopy and general evaluation because I feel that  you have a significant problem. After the admission, you decided not to  go through the colonoscopy and really were not interested in further  evaluation. For that reason, I am not sure that I have anything else to  offer you in terms of treating your constipation. You have 4 weeks to  find another gastroenterologist.    Sincerely,      Hedwig Morton. Juanda Chance, MD  Electronically Signed    DMB/MedQ  DD: 10/11/2006  DT: 10/11/2006  Job #: [id number]

## 2010-10-10 NOTE — H&P (Signed)
[facility]  Patient:    [name], [name]                        MRN: [id number] Adm. Date:  06/17/00 Attending:  Janae Bridgeman. Eloise Harman., M.D. Dictator:   Tarri Fuller, P.A.                         History and Physical  DATE OF BIRTH:  November 04, 1955  CHIEF COMPLAINT:  Headache, shortness of breath, and palpitations.  HISTORY OF PRESENT ILLNESS:  [name] is a 55 year old male who presented to the emergency department today with a two day history of sudden onset of headache, shortness of breath, rapid heartbeat, and very dark black stools. He had one episode of vomiting of clear liquid without blood.  He does admit to some chills and diminished appetite during the last two days.  He came today to the emergency department because he thought he was having a heart attack.  He has never in the past had any problems with peptic ulcer disease, heartburn, or any type of inflammatory bowel disease or polyps.  There is no family history of colon cancer or inflammatory bowel disease.  Patient does admit to taking two over-the-counter ibuprofen about every two days for chronic neck pain and stiffness.  He has about one beer per week.  He denies use of any other anti-inflammatory products or aspirin.  He does not smoke. His hemoglobin was found to be 5.8.  His stools were positive for occult melena.  Patient is to be admitted for evaluation of GI bleed.  REVIEW OF SYSTEMS:  Patient denies any heartburn, chest pain, or chronic dyspnea before the last two days.  He has no history of edema or orthopnea. No hemoptysis or hematemesis.  Appetite has been good.  Sleep quality is good. Overall quality of health is good.  He has no problems with vision, hearing, speech, swallowing.  Dentition is in good repair.  He has no urinary complaints or problems with his stools normally.  He denies abdominal pain, paraesthesias of the extremities, paralysis, or muscle weakness.   He is currently being evaluated by Dr. Audery Amel for some bilateral shoulder pain and just had an arthrogram this last week.  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 Review of systems is otherwise negative.  CURRENT MEDICATIONS: 1. Lysine which is some type of cold medication. 2. Multivitamin daily. 3. Ibuprofen two tablets every other day.  ALLERGIES:  PROZAC which causes constipation, ALKA-SELTZER PLUS makes him nervous.  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  PAST SURGICAL HISTORY:  Two arthroscopic surgeries on the left knee in 1985 and 1987, left inguinal herniorrhaphy 1980, surgery for imperforate anus as an infant.  SOCIAL HISTORY:  Patient is unmarried without children.  He is employed by [employer].  He currently lives with his mother here in [place] and has a sister here in [place] as well.  He has no religious preference.  He rides his bicycle about once every week.  He denies use of tobacco products.  Has about one beer every week.  FAMILY HISTORY:  Mother is  alive at age 5 and suffers from heart disease and diabetes.  Father died age 49 due to Alzheimers.  One sister is alive and well with anemia age 69.  One brother is alive and well age 8.  PHYSICAL EXAMINATION:  VITAL SIGNS:  Age 89.  Weight 130 pounds, pulse 104 and regular, blood pressure 113/63.  GENERAL:  Very pale, thin, alert, well-oriented 55 year old Caucasian male in no acute distress.  HEENT:  Oropharynx is pink and moist.  Mucous membranes are very pale including the conjunctivae.  Uvula midline.  Dentition is in good repair.  NECK:  Supple without  lymphadenopathy or mass.  No thyromegaly.  CARDIAC:  Tachycardic without murmur.  CHEST:  Clear to auscultation without wheezing, rales, or rhonchi.  ABDOMEN:  Very thin, soft, nontender, and nondistended with good bowel sounds throughout the abdomen.  He has a vertical midline incision from surgery as a child.  He has no abdominal tenderness or organomegaly.  RECTAL:  Per the emergency department was heme positive with black ______ stools.  EXTREMITIES:  Very pale without edema or rash.  Deep tendon reflexes 2+ upper extremities bilaterally, 3+ lower extremities bilaterally.  Babinskis is downgoing.  Did not have patient ambulate but musculoskeletal strength is grossly normal with no significant muscular atrophy, although patient has somewhat small frame.  GENITOURINARY:  Deferred.  Not pertinent to present illness.  LABORATORIES:  Hemoglobin 5.8, hematocrit 16.7.  Sodium 139, potassium low 3.2, chloride 113, CO2 28, BUN 15, creatinine 0.8, glucose 93.  Chest x-ray is unremarkable.  ASSESSMENT: 1. Gastrointestinal bleed, probable upper gastrointestinal source. 2. Severe anemia. 3. Dyspnea with palpitations. 4. History of severe cervical congenital anomalies. 5. 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 6. History of panic disorder and anxiety.  PLAN:  Patient will be admitted for GI consult.  Dr. Loreta Ave was covering for Dr. Virginia Rochester who is currently sick, therefore GI consult will be completed by Dr. Arlyce Dice and his P.A.  Patient is scheduled for endoscopy on Friday at 3:30 in the afternoon.  Patient is awaiting a blood transfusion of two units.  Repeat CBC six hours after transfusion and again tomorrow morning.  Pepcid 20 mg IV q.12h. as needed for abdominal pain or heartburn.  Phenergan 25 mg IV as needed for nausea or vomiting.  Clear liquids after breakfast tomorrow morning.  Continue current home medications. DD:  06/17/00 TD:  06/17/00 Job: [id number]

## 2010-10-10 NOTE — H&P (Signed)
NAMEJIMY, [name]               ACCOUNT NO.:  [id number]   MEDICAL RECORD NO.:  [id number]          PATIENT TYPE:  INP   LOCATION:  6702                         FACILITY:  [facility]   PHYSICIAN:  Hedwig Morton. Juanda Chance, MD     DATE OF BIRTH:  09/10/1955   DATE OF ADMISSION:  09/28/2006  DATE OF DISCHARGE:                              HISTORY & PHYSICAL   REASON FOR ADMISSION:  Being admitted to allow for inpatient bowel  preparation.   HISTORY OF PRESENT ILLNESS:  The patient is a 55 year old white  gentleman with a long history of intestinal issues.  He was born with an  imperforate anus in 52 and at the age of 3 underwent a surgical repair  of this.  The patient has been plagued with constipation his entire  life.  As a child, he managed successful bowel movements with high fiber  diet, fiber supplements, and stool softeners.  About one year ago, the  patient developed progression in the constipation, and this became  refractory to many laxatives.  When he would use laxatives, he would end  up having fecal incontinence.  This was especially true when he started  using stronger laxatives.  He stopped using all laxatives about two  weeks ago.  The most recent laxative he was using [employer].  The incontinence is embarrassing, and it has changed his relation to  society significantly.  He does not go out.  He chooses not to eat in  order to avoid developing urge to have a bowel movement or in order to  avoid incontinence.  As a result, he has lost at least 10 pounds, but  looking at him he appears to be probably 30 pounds under weight.   Prior attempts at colonoscopy have been unsuccessful because he has been  unable to complete the bowel preparation.  Prior attempts at colonoscopy  were by Dr. Virginia Rochester.  He had a Sitzmark colon transit study in March of 2008  which was abnormal; 22/24 Sitzmark's were retained proximal to the  sigmoid colon.   The patient has had prior imaging  studies over the last several years.  In 2002, he had a small bowel follow through which showed normal small  bowel transit and no obstruction or gross structural abnormalities.  In  2004, a CT scan of the abdomen and pelvis did show a slightly malrotated  left kidney but nothing remarkable as far as the intestine went except  for a very low lying cecum.   MEDICAL ALLERGIES:  NONE KNOWN.   CURRENT MEDICATIONS:  None.   PAST MEDICAL HISTORY:  1. History of imperforate anus.  Status post surgical repair of this      at age 74 at [facility].  2. Colonic inertia with chronic lifelong constipation.  3. History of a gastrointestinal bleed in January of 2002.  Had an      upper endoscopy at that time, but no source for the bleeding was      found.  No source found on small bowel follow through.  He had been  using ibuprofen at the time, so question as to whether he had      colonic or small bowel associated NSAID induced ulcers.  4. Acute blood loss anemia associated with the above gastrointestinal      bleed.  Required transfusion with 3 units of packed red blood cells      during that admission.  5. Idiopathic scoliosis.  6. Status post unremarkable upper endoscopy in 2004 by Dr. Sabino Gasser      to investigate gastrointestinal bleeding and abdominal pain.  He      was to have followed up with a chloride sweat test following this      upper endoscopy but not clear that this was ever performed.  7. Status post knee surgery in 1992 and 1994.  8. Status post left inguinal hernia repair in the 1980s.   SOCIAL HISTORY:  The patient is single.  He lives alone in [place].  He works as a Electrical engineer, currently working part-time.  Does not  consume alcoholic beverages and does not smoke.   FAMILY HISTORY:  Maternal grandparents have coronary artery disease.  There is no history of colon disease, colon cancers, constipation, or  gastrointestinal bleeds.   REVIEW OF SYSTEMS:   Generally, as above noted, the patient has had a  weight loss associated with decreased p.o. intake.  Genitourinary:  Denies urinary incontinence, urgency, or frequency.  Musculoskeletal:  Denies joint swelling, pain, or arthritis.  Hematology:  No recent  episodes of gastrointestinal bleeding.  No nose bleeds.  No unexplained  bruises.  Neurologic:  No headaches.  No history of seizures.  No  extremity weakness or numbness.  Memory is not impaired.  No problems  with insomnia.  Pulmonary:  No cough.  No shortness of breath.  No  history of tuberculosis.  No current rhinorrhea or sinus problems.  Cardiovascular:  No history of hypertension.  No chest pain.  Dermatologic:  No rashes.  No unusual growths or lesions of the skin.  No loss of hair.  Otherwise, the review of systems is normal or  unremarkable.   LABS:  The hemoglobin is 15.6, hematocrit 45.5, MCV 85.8.  White blood  cells 5.8, platelets 276,000.  Comprehensive metabolic profile is  completely within normal limits with a BUN of 9, creatinine of 0.92,  glucose of 54.  All liver function tests normal.  The albumin level is  4.6.  PT 14.8, INR 1.1, PTT 35.   Abdominal films to be completed and reported in the future.   PHYSICAL EXAMINATION:  VITAL SIGNS:  Temperature 97.9, pulse is 59,  blood pressure 107/74, room air saturation is 100% with respirations of  14.  Weight is 54.4 kg or 120 pounds.  GENERAL:  The patient is a thin, chronically unwell appearing white  male.  He is not toxic.  He is reserved and somewhat quiet in nature.  He is very cooperative.  HEENT:  Sclerae are nonicteric.  Conjunctivae are pink.  Extraocular  movements intact.  Oropharynx:  The mucosa is moist and clear.  No  lesions.  No exudates.  NECK:  There is no jugular venous distention, masses, or thyromegaly.  CARDIOVASCULAR:  Rhythm is regular.  No bradycardia.  No tachycardia. S1 and S2 audible.  No murmurs, rubs, or gallops.  PULMONARY:  Chest is  clear to auscultation and percussion bilaterally.  No dyspnea, no cough.  GASTROINTESTINAL:  Abdomen is soft, mildly protuberant, and has a full  doughy-type feel.  There is some voluntary guarding.  No tenderness.  No  masses.  No hepatosplenomegaly.  Bowel sounds are hyperactive.  Rectum  was not repeated today, but in the office showed a stoma-like appearance  of the rectal neosphincter with decreased tone.  There is a large amount  of palpable soft guaiac-negative stool.  EXTREMITIES:  No cyanosis, clubbing, or edema.  NEUROLOGIC:  No tremor.  The patient walks without difficulty.  Strength  of the upper and lower extremities is full bilaterally.  He is alert and  oriented x3.  PSYCHIATRIC:  The patient is appropriate and cooperative.  DERMATOLOGIC:  The patient is slightly pale but no rashes, no spider  angiomata visible.   IMPRESSION:  1. Colonic inertia, especially at the distal colon with inability to      evacuate stool due to adult Hirschsprung's-like disease.  This is a      consequence of a history of imperforate anus corrected as an      infant.  2. Fecal incontinence due to laxatives used to deal with the colonic      inertia.  3. Weight loss due to self-imposed food intake restriction in order to      avoid incontinence.   PLAN:  The patient is admitted now in order to have successful bowel  preparation in an inpatient setting and plan for colonoscopy tomorrow.  It actually may take this patient more than 24 hours to complete a bowel  preparation given the high degree of inertia he has.  The patient may be  a good candidate for ileostomy and need to consider surgical  consultation in order for the patient to discuss this with a surgeon.   DICTATED BY:  Jennye Moccasin, PA-C      Hedwig Morton. Juanda Chance, MD  Electronically Signed     DMB/MEDQ  D:  09/28/2006  T:  09/28/2006  Job:  [id number]

## 2010-10-10 NOTE — Assessment & Plan Note (Signed)
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  We have done a sitz marks test on him which showed scattered sitz  marks in the cecum, in the ascending colon, hepatic flexure, now also  transverse colon and the descending colon.  There were no sitz marks  that progressed beyond descending colon.  He had controlled his bowel  incontinence by reducing his foods to strict low fiber diet with no fat  content.  As a result, he has continued to lose weight between the last  appointment and today, he has lost an additional 6 pounds.  His consists  of white toast, white rice for breakfast and [place] sandwich on white  bread without any dressing, the same thing for supper.  If he eats  anymore than that, he develops some incontinent stool, so his problem is  not just constipation but also loss of bowel control, and his stools  become soft or loose.  He does not have any control.  He has lost his  job recently and consequently has no insurance; therefore, he is limited  in how many tests he can have.   PHYSICAL EXAMINATION:  VITAL SIGNS:  Blood pressure 110/62, pulse 80,  and weight 123 pounds.  GENERAL:  He appears cachectic.  He is alert and oriented.  LUNGS:  Clear to auscultation.  CARDIAC:  Normal S1 and S2.  ABDOMEN:  Soft, nontender, very relaxed.  I could not feel any impacted  stools.  There was no tenderness.  RECTAL:  He would not let me do a rectal exam today.   IMPRESSION:  A 55 year old white male with complex problem of colonic  inertia, incompetent rectal sphincter, and  incontinence.  He also has  sort of an eating disorder in that he has selfimposed dietary  restrictionsto reduce his incontinence, resulting in inadequate  nutritional support.  He is borderline malnourished.   PLAN:  I have discussed it with him extensively, and he seems to be  quite understanding of the problem, but I am not sure whether he will be  flexible enough to change his eating habits and really cooperate with my  suggestions.  1. Increase fiber in his diet gradually.  2. We need to accomplish a complete bowel clean-out before we start      him on a laxative regimen; therefore, I gave him Fleets Phospho-      Soda prescription.  It will be over-the-counter  bottles of 1.5      ounces to take 2 a day for 2 days.  This hopefully will result in      complete cleaning of his colon.  3. His bowel regimen in the future may  consistent of a paraplegic-like      regimen of using  laxatives on the weekends to evacuate his stool      to prevent him from having accidents during the week.  He is      planning to get a job soon, and, again, he will need good control      of his rectal sphincter.  4. I have also mentioned to him  an eventual possibility of colostomy      to achieve better control      of his bowel movements.  I am not sure that we will be able to      successfully control his bowel habits at this time.     [name]. Juanda Chance, MD  Electronically Signed    DMB/MedQ  DD: 09/16/2006  DT: 09/16/2006  Job #: [id number]   cc:   Janae Bridgeman. Eloise Harman., M.D.

## 2011-03-11 LAB — COMPREHENSIVE METABOLIC PANEL
ALT: 14
AST: 22
Albumin: 4.5
Alkaline Phosphatase: 49
BUN: 9
CO2: 30
Calcium: 10
Chloride: 105
Creatinine, Ser: 1.04
GFR calc Af Amer: >60
GFR calc non Af Amer: >60
Glucose, Bld: 97
Potassium: 4
Sodium: 142
Total Bilirubin: 0.7
Total Protein: 7.3

## 2011-03-11 LAB — CBC
HCT: 41.6
Hemoglobin: 13.9
MCHC: 33.5
MCV: 86.5
Platelets: 246
RBC: 4.81
RDW: 13.2
WBC: 4.5

## 2011-07-16 ENCOUNTER — Encounter: Payer: Self-pay | Admitting: Family Medicine

## 2011-07-16 ENCOUNTER — Ambulatory Visit (INDEPENDENT_AMBULATORY_CARE_PROVIDER_SITE_OTHER): Payer: Self-pay | Admitting: Family Medicine

## 2011-07-16 DIAGNOSIS — K5900 Constipation, unspecified: Secondary | ICD-10-CM | POA: Insufficient documentation

## 2011-07-16 DIAGNOSIS — R110 Nausea: Secondary | ICD-10-CM

## 2011-07-16 MED ORDER — PROMETHAZINE HCL 25 MG/ML IJ SOLN
25.0000 mg | Freq: Once | INTRAMUSCULAR | Status: AC
  Administered 2011-07-16: 25 mg via INTRAMUSCULAR

## 2011-07-16 NOTE — Patient Instructions (Signed)
Schedule a physical w/ Dr Burnett Corrente notify you of your lab results I think the nausea is due to the fact that you are not stooling- you're backed up Start OTC Miralax twice daily Use an enema to help clean you out Call with any questions or concerns Hang in there!

## 2011-07-16 NOTE — Progress Notes (Signed)
  Subjective:    Patient ID: [name], male    DOB: 10-30-1955, 56 y.o.   MRN: [id number]  HPI Nausea/vomiting- no BM x4 days.  Now w/ dry heaves.  No fevers.  Hx of bowel problems.  Reports hx of similar.  + gas.  + abdominal tenderness.  No known sick contacts.  Feels he ate 'bad hamburger' on Sunday.  Denies diarrhea or bloody stool.  Pt has hx of obstipation after reviewing GI notes.  Pt has not been seen in 1 yr- has lost over 15 lbs.  Reports eating regularly.   Review of Systems For ROS see HPI     Objective:   Physical Exam  Vitals reviewed. Constitutional:       Thin, disheveled  HENT:  Head: Normocephalic and atraumatic.  Cardiovascular: Normal rate, regular rhythm, normal heart sounds and intact distal pulses.   No murmur heard. Pulmonary/Chest: Effort normal and breath sounds normal. No respiratory distress. He has no wheezes. He has no rales.  Abdominal: Soft. He exhibits distension. He exhibits no mass. There is no tenderness. There is no rebound and no guarding.       Hyperactive BS Large central abdominal scar          Assessment & Plan:

## 2011-07-16 NOTE — Assessment & Plan Note (Signed)
New.  Pt's sxs likely due to constipation/obstipation.  + abdominal distention but no focal tenderness.  Hx of similar.  No current vomiting- still eating and drinking.  Check labs to r/o infxn, electrolyte abnormality.  Reviewed supportive care and red flags that should prompt return.  Pt expressed understanding and is in agreement w/ plan.

## 2011-07-16 NOTE — Assessment & Plan Note (Signed)
New but hx of similar.  Start Miralax bid and enema from below.  If no improvement will need GI referral.

## 2011-07-17 ENCOUNTER — Inpatient Hospital Stay (HOSPITAL_COMMUNITY)
Admission: EM | Admit: 2011-07-17 | Discharge: 2011-08-20 | DRG: 329 | Disposition: A | Discharge status: Home Health | Payer: Medicaid Other | Attending: General Surgery | Admitting: General Surgery

## 2011-07-17 ENCOUNTER — Telehealth: Payer: Self-pay | Admitting: *Deleted

## 2011-07-17 ENCOUNTER — Encounter (HOSPITAL_COMMUNITY): Payer: Self-pay | Admitting: *Deleted

## 2011-07-17 ENCOUNTER — Emergency Department (HOSPITAL_COMMUNITY): Payer: Medicaid Other

## 2011-07-17 DIAGNOSIS — K567 Ileus, unspecified: Secondary | ICD-10-CM | POA: Diagnosis not present

## 2011-07-17 DIAGNOSIS — K5909 Other constipation: Secondary | ICD-10-CM | POA: Diagnosis present

## 2011-07-17 DIAGNOSIS — Y838 Other surgical procedures as the cause of abnormal reaction of the patient, or of later complication, without mention of misadventure at the time of the procedure: Secondary | ICD-10-CM | POA: Diagnosis not present

## 2011-07-17 DIAGNOSIS — K9189 Other postprocedural complications and disorders of digestive system: Secondary | ICD-10-CM | POA: Diagnosis not present

## 2011-07-17 DIAGNOSIS — K565 Intestinal adhesions [bands], unspecified as to partial versus complete obstruction: Principal | ICD-10-CM | POA: Diagnosis present

## 2011-07-17 DIAGNOSIS — Z681 Body mass index (BMI) 19 or less, adult: Secondary | ICD-10-CM

## 2011-07-17 DIAGNOSIS — K560 Paralytic ileus: Secondary | ICD-10-CM | POA: Diagnosis not present

## 2011-07-17 DIAGNOSIS — R110 Nausea: Secondary | ICD-10-CM | POA: Diagnosis present

## 2011-07-17 DIAGNOSIS — T8140XA Infection following a procedure, unspecified, initial encounter: Secondary | ICD-10-CM | POA: Diagnosis not present

## 2011-07-17 DIAGNOSIS — E46 Unspecified protein-calorie malnutrition: Secondary | ICD-10-CM | POA: Diagnosis present

## 2011-07-17 DIAGNOSIS — R1013 Epigastric pain: Secondary | ICD-10-CM | POA: Diagnosis not present

## 2011-07-17 DIAGNOSIS — K651 Peritoneal abscess: Secondary | ICD-10-CM | POA: Diagnosis not present

## 2011-07-17 DIAGNOSIS — E871 Hypo-osmolality and hyponatremia: Secondary | ICD-10-CM | POA: Diagnosis not present

## 2011-07-17 DIAGNOSIS — E876 Hypokalemia: Secondary | ICD-10-CM | POA: Diagnosis not present

## 2011-07-17 DIAGNOSIS — R109 Unspecified abdominal pain: Secondary | ICD-10-CM | POA: Diagnosis present

## 2011-07-17 DIAGNOSIS — F329 Major depressive disorder, single episode, unspecified: Secondary | ICD-10-CM | POA: Diagnosis present

## 2011-07-17 DIAGNOSIS — M4120 Other idiopathic scoliosis, site unspecified: Secondary | ICD-10-CM | POA: Diagnosis present

## 2011-07-17 DIAGNOSIS — Z87738 Personal history of other specified (corrected) congenital malformations of digestive system: Secondary | ICD-10-CM

## 2011-07-17 DIAGNOSIS — F411 Generalized anxiety disorder: Secondary | ICD-10-CM | POA: Diagnosis present

## 2011-07-17 DIAGNOSIS — R066 Hiccough: Secondary | ICD-10-CM | POA: Diagnosis not present

## 2011-07-17 DIAGNOSIS — R197 Diarrhea, unspecified: Secondary | ICD-10-CM | POA: Diagnosis not present

## 2011-07-17 DIAGNOSIS — K56609 Unspecified intestinal obstruction, unspecified as to partial versus complete obstruction: Secondary | ICD-10-CM | POA: Diagnosis present

## 2011-07-17 DIAGNOSIS — Z8711 Personal history of peptic ulcer disease: Secondary | ICD-10-CM

## 2011-07-17 DIAGNOSIS — K3189 Other diseases of stomach and duodenum: Secondary | ICD-10-CM | POA: Diagnosis not present

## 2011-07-17 DIAGNOSIS — F3289 Other specified depressive episodes: Secondary | ICD-10-CM | POA: Diagnosis present

## 2011-07-17 HISTORY — DX: Functional intestinal disorder, unspecified: K59.9

## 2011-07-17 HISTORY — DX: Personal history of other diseases of the digestive system: Z87.19

## 2011-07-17 HISTORY — DX: Other idiopathic scoliosis, site unspecified: M41.20

## 2011-07-17 LAB — CBC
HCT: 45.5 % (ref 39.0–52.0)
Hemoglobin: 16.1 g/dL (ref 13.0–17.0)
MCH: 29.2 pg (ref 26.0–34.0)
MCHC: 35.4 g/dL (ref 30.0–36.0)
MCV: 82.4 fL (ref 78.0–100.0)
Platelets: 297 10*3/uL (ref 150–400)
RBC: 5.52 MIL/uL (ref 4.22–5.81)
RDW: 13.9 % (ref 11.5–15.5)
WBC: 7 10*3/uL (ref 4.0–10.5)

## 2011-07-17 LAB — CBC WITH DIFFERENTIAL/PLATELET
Basophils Absolute: 0 10*3/uL (ref 0.0–0.1)
Basophils Relative: 0.2 % (ref 0.0–3.0)
Eosinophils Absolute: 0 10*3/uL (ref 0.0–0.7)
Eosinophils Relative: 0.1 % (ref 0.0–5.0)
HCT: 43.6 % (ref 39.0–52.0)
Hemoglobin: 14.7 g/dL (ref 13.0–17.0)
Lymphocytes Relative: 13.8 % (ref 12.0–46.0)
Lymphs Abs: 0.5 10*3/uL — ABNORMAL LOW (ref 0.7–4.0)
MCHC: 33.6 g/dL (ref 30.0–36.0)
MCV: 85.9 fl (ref 78.0–100.0)
Monocytes Absolute: 0.4 10*3/uL (ref 0.1–1.0)
Monocytes Relative: 11.9 % (ref 3.0–12.0)
Neutro Abs: 2.6 10*3/uL (ref 1.4–7.7)
Neutrophils Relative %: 74 % (ref 43.0–77.0)
Platelets: 261 10*3/uL (ref 150.0–400.0)
RBC: 5.08 Mil/uL (ref 4.22–5.81)
RDW: 14.5 % (ref 11.5–14.6)
WBC: 3.5 10*3/uL — ABNORMAL LOW (ref 4.5–10.5)

## 2011-07-17 LAB — HEPATIC FUNCTION PANEL
ALT: 29 U/L (ref 0–53)
AST: 46 U/L — ABNORMAL HIGH (ref 0–37)
Albumin: 4.3 g/dL (ref 3.5–5.2)
Alkaline Phosphatase: 50 U/L (ref 39–117)
Bilirubin, Direct: 0.2 mg/dL (ref 0.0–0.3)
Total Bilirubin: 0.8 mg/dL (ref 0.3–1.2)
Total Protein: 7.1 g/dL (ref 6.0–8.3)

## 2011-07-17 LAB — BASIC METABOLIC PANEL
BUN: 22 mg/dL (ref 6–23)
BUN: 31 mg/dL — ABNORMAL HIGH (ref 6–23)
CO2: 31 mEq/L (ref 19–32)
CO2: 30 mEq/L (ref 19–32)
Calcium: 9.4 mg/dL (ref 8.4–10.5)
Calcium: 10.1 mg/dL (ref 8.4–10.5)
Chloride: 92 mEq/L — ABNORMAL LOW (ref 96–112)
Chloride: 89 mEq/L — ABNORMAL LOW (ref 96–112)
Creatinine, Ser: 0.8 mg/dL (ref 0.4–1.5)
Creatinine, Ser: 0.79 mg/dL (ref 0.50–1.35)
GFR calc Af Amer: >90 mL/min (ref >90)
GFR calc non Af Amer: >90 mL/min (ref >90)
GFR: 103.85 mL/min (ref >60.00)
Glucose, Bld: 126 mg/dL — ABNORMAL HIGH (ref 70–99)
Glucose, Bld: 119 mg/dL — ABNORMAL HIGH (ref 70–99)
Potassium: 3.5 mEq/L (ref 3.5–5.1)
Potassium: 3.2 mEq/L — ABNORMAL LOW (ref 3.5–5.1)
Sodium: 134 mEq/L — ABNORMAL LOW (ref 135–145)
Sodium: 134 mEq/L — ABNORMAL LOW (ref 135–145)

## 2011-07-17 LAB — DIFFERENTIAL
Basophils Absolute: 0 10*3/uL (ref 0.0–0.1)
Basophils Relative: 0 % (ref 0–1)
Eosinophils Absolute: 0 10*3/uL (ref 0.0–0.7)
Eosinophils Relative: 0 % (ref 0–5)
Lymphocytes Relative: 8 % — ABNORMAL LOW (ref 12–46)
Lymphs Abs: 0.5 10*3/uL — ABNORMAL LOW (ref 0.7–4.0)
Monocytes Absolute: 1 10*3/uL (ref 0.1–1.0)
Monocytes Relative: 15 % — ABNORMAL HIGH (ref 3–12)
Neutro Abs: 5.5 10*3/uL (ref 1.7–7.7)
Neutrophils Relative %: 78 % — ABNORMAL HIGH (ref 43–77)

## 2011-07-17 LAB — TSH: TSH: 1.25 u[IU]/mL (ref 0.35–5.50)

## 2011-07-17 MED ORDER — DIPHENHYDRAMINE HCL 12.5 MG/5ML PO ELIX: 12.5000 mg | ORAL_SOLUTION | Freq: Four times a day (QID) | ORAL | Status: DC | PRN

## 2011-07-17 MED ORDER — ONDANSETRON HCL 4 MG/2ML IJ SOLN: 4.0000 mg | Freq: Four times a day (QID) | INTRAMUSCULAR | Status: DC | PRN

## 2011-07-17 MED ORDER — HEPARIN SODIUM (PORCINE) 5000 UNIT/ML IJ SOLN
5000.0000 [IU] | Freq: Three times a day (TID) | INTRAMUSCULAR | Status: DC
Start: 2011-07-17 — End: 2011-07-18
  Administered 2011-07-17 – 2011-07-18 (×2): 5000 [IU] via SUBCUTANEOUS
  Filled 2011-07-17 (×5): qty 1

## 2011-07-17 MED ORDER — KCL IN DEXTROSE-NACL 20-5-0.45 MEQ/L-%-% IV SOLN
INTRAVENOUS | Status: DC
  Administered 2011-07-17 – 2011-07-18 (×2): via INTRAVENOUS
  Administered 2011-07-18: 125 mL via INTRAVENOUS
  Filled 2011-07-17 (×5): qty 1000

## 2011-07-17 MED ORDER — SODIUM CHLORIDE 0.9 % IV SOLN
INTRAVENOUS | Status: DC
  Administered 2011-07-17: 16:00:00 via INTRAVENOUS

## 2011-07-17 MED ORDER — MORPHINE SULFATE 4 MG/ML IJ SOLN
4.0000 mg | Freq: Once | INTRAMUSCULAR | Status: AC
  Administered 2011-07-17: 4 mg via INTRAVENOUS
  Filled 2011-07-17: qty 1

## 2011-07-17 MED ORDER — PANTOPRAZOLE SODIUM 40 MG IV SOLR
40.0000 mg | Freq: Every day | INTRAVENOUS | Status: DC
  Administered 2011-07-18: 40 mg via INTRAVENOUS
  Filled 2011-07-17: qty 40

## 2011-07-17 MED ORDER — LIDOCAINE HCL 2 % EX GEL
CUTANEOUS | Status: AC
  Administered 2011-07-17: 17:00:00
  Filled 2011-07-17: qty 30

## 2011-07-17 MED ORDER — MORPHINE SULFATE 2 MG/ML IJ SOLN: 1.0000 mg | INTRAMUSCULAR | Status: DC | PRN

## 2011-07-17 MED ORDER — IOHEXOL 300 MG/ML  SOLN
100.0000 mL | Freq: Once | INTRAMUSCULAR | Status: AC | PRN
  Administered 2011-07-17: 80 mL via INTRAVENOUS

## 2011-07-17 MED ORDER — DIPHENHYDRAMINE HCL 50 MG/ML IJ SOLN: 12.5000 mg | Freq: Four times a day (QID) | INTRAMUSCULAR | Status: DC | PRN

## 2011-07-17 NOTE — Telephone Encounter (Signed)
Please advise 

## 2011-07-17 NOTE — Telephone Encounter (Signed)
[facility] Triage Call Report Triage Record Num: [id number] Operator: Fernand Parkins Patient Name: [name] Call Date & Time: 07/17/2011 11:52:05AM Patient Phone: [phone] PCP: Neena Rhymes ([facility]) Patient Gender: Male PCP Fax : Patient DOB: [birth year] Practice Name: [facility] Reason for Call: Caller: Nina/Mother; PCP: Sheliah Hatch.; CB#: [phone]; ; ; Call regarding Seen Yesterday for Vomiting Yesterday, Still Vomiting THE PATIENT REFUSED 911; Afebrile. Nina/Mother calling regarding he's vomiting and can't keep anything down - onset 07/14/2011 but seems to be getting worse today. Saw pcp 07/16/2011 d/t constipation and was given an Rx for a laxative. He's been taking the laxative and has tried an enema but he's still unable to have a BM. Mom advises that his stomach looks swollen and that he needs assistance to walk d/t weakness. Advised mom to hang up to EMS 911 immediately - per Nausea or Vomiting guidelines. Care advice given. All emergent sxs r/o with exception of Protocol(s) Used: Nausea or Vomiting Recommended Outcome per Protocol: Activate EMS 911 Reason for Outcome: New or worsening signs and symptoms that may indicate shock Care Advice: ~ Do not give the patient anything to eat or drink. ~ An adult should stay with the patient, preferably one trained in CPR. ~ If vomiting occurs or appears likely, turn on side to prevent aspiration. Lay the person down and elevate legs at least 12 inches (30 cm) above level of heart. Cover to help maintain body temperature. ~ ~ IMMEDIATE ACTION Write down provider's name. List or place the following in a bag for transport with the patient: current prescription and/or nonprescription medications; alternative treatments, therapies and medications; and street drugs.

## 2011-07-17 NOTE — Telephone Encounter (Signed)
Tried to call pt multiple times with no contact made per number is always busy, pt noted seen in office yesterday and verified current number, MD Tabori advised verbally that pt has still yet to be contacted per noted advice to call pt to see if his sxs are any better, will try to call again later, noted pt did schedule an upcoming apt with PCP Paz on 08-31-11.

## 2011-07-17 NOTE — ED Notes (Signed)
Pt states he started to have abdominal pain about 5 days. Pt states he went to see a doctor and was told he was constipated. Pt states he is now unable to keep po's down

## 2011-07-17 NOTE — H&P (Signed)
Patient now has N/G and is much more comfortable. About 1000 cc light green drainage.  Abd is soft, mild LUQ tender , no rebound  CY c/w proximal SBO.  IMP SBO, Likely hi grade  Plan: Re check xrays in AM, then decide.   Discussed plans with patient and his mother.

## 2011-07-17 NOTE — ED Provider Notes (Cosign Needed)
History     CSN: [id number]  Arrival date & time 07/17/11  1237   First MD Initiated Contact with Patient 07/17/11 1258      Chief Complaint  Patient presents with  . Abdominal Pain    (Consider location/radiation/quality/duration/timing/severity/associated sxs/prior treatment) Patient is a 56 y.o. male presenting with abdominal pain. The history is provided by the patient and a parent.  Abdominal Pain The primary symptoms of the illness include abdominal pain and nausea. The primary symptoms of the illness do not include fever, vomiting or diarrhea.  Additional symptoms associated with the illness include constipation. Symptoms associated with the illness do not include chills.   56 year old, male, with a history of abdominal surgery, presents to the emergency department complaining of no bowel movement for approximately 5 days, along with abdominal discomfort, and dry heaves.  He denies vomiting.  He was seen by his physician, and told to come here for evaluation of possible constipation.  He denies any other pain.  He denies any other symptoms.  He denies alcohol use or cigarette use.  He does not want pain medication at this time.  History reviewed. No pertinent past medical history.  History reviewed. No pertinent past surgical history.  No family history on file.  History  Substance Use Topics  . Smoking status: Never Smoker   . Smokeless tobacco: Not on file  . Alcohol Use: No      Review of Systems  Constitutional: Negative for fever and chills.  Gastrointestinal: Positive for nausea, abdominal pain and constipation. Negative for vomiting and diarrhea.  All other systems reviewed and are negative.    Allergies  Review of patient's allergies indicates no known allergies.  Home Medications  No current outpatient prescriptions on file.  BP 117/88  Pulse 91  Temp(Src) 98.1 F (36.7 C) (Oral)  Resp 22  Ht 5\' 7"  (1.702 m)  Wt 114 lb (51.71 kg)  BMI 17.85  kg/m2  SpO2 100%  Physical Exam  Vitals reviewed. Constitutional: He is oriented to person, place, and time.       Cachectic elderly disheveled male, in moderate amount of discomfort  HENT:  Head: Normocephalic and atraumatic.  Eyes: Conjunctivae are normal. Pupils are equal, round, and reactive to light.  Neck: Normal range of motion. Neck supple.  Cardiovascular: Normal rate.   No murmur heard. Pulmonary/Chest: Effort normal and breath sounds normal.  Abdominal: Soft. He exhibits no distension and no mass. There is tenderness. There is no rebound and no guarding.       Mild diffuse tenderness without distention  Musculoskeletal: Normal range of motion. He exhibits no edema and no tenderness.  Neurological: He is alert and oriented to person, place, and time. No cranial nerve deficit.  Skin: Skin is warm and dry.  Psychiatric: He has a normal mood and affect. Thought content normal.    ED Course  Procedures (including critical care time) 56 year old, male, with a history of prior abdominal surgery, presents emergency Department, with no bowel movement for the past 5 days, along with nausea, no vomiting.  His abdomen is soft with mild diffuse tenderness.  There is decreased.  Bowel sounds.  We will perform an x-ray, to look for signs of obstruction.  If negative.  We will give him a laxative   Labs Reviewed - No data to display No results found.   No diagnosis found.  3:57 PM I spoke with [facility] [place] [facility].  They will come and evaluate the patient for  a bowel obstruction  MDM  SBO        Nicholes Stairs, MD 07/17/11 1526  Nicholes Stairs, MD 07/17/11 [phone]

## 2011-07-17 NOTE — H&P (Signed)
[name] Jun 29, 1955  [id number].   Primary Care MD: Dr. Beverely Low Requesting MD: Dr. 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  Because of this problem he has suffered from severe constipation his whole life.  5 days ago he developed abdominal distention, nausea, and vomiting.  He has not had a BM in 5 days.  He spoke to his PCP who recommended the patient do an enema at home.  Apparently this did not help and the patient presented to the [facility] today for further evaluation.  He was found to have severe constipation and a PSBO on x-ray.  We have been asked to see.  Review of systems: Please see HPI otherwise all other systems are negative.  No family history on file.  Patient Active Problem List  Diagnoses  . ANXIETY  . PSYCHIATRIC DISORDER  . DEPRESSION  . PUD  . GI BLEEDING  . ARTHRITIS  . SCOLIOSIS  . IMPERFORATE ANUS  . FECAL INCONTINENCE  . INGUINAL HERNIA, HX OF  . FATIGUE  . DYSPNEA  . Obstipation  . Nausea     PSH: Left inguinal hernia repair Laparotomy for imperforate anus  Social History:  reports that he has never smoked. He does not have any smokeless tobacco history on file. He reports that he does not drink alcohol or use illicit drugs.  Allergies: No Known Allergies  Medications Prior to Admission  Medication Dose Route Frequency Provider Last Rate Last Dose  . 0.9 %  sodium chloride infusion   Intravenous Continuous Nicholes Stairs, MD 125 mL/hr at 07/17/11 1538    . lidocaine (XYLOCAINE) 2 % jelly           . morphine 4 MG/ML injection 4 mg  4 mg Intravenous Once Nicholes Stairs, MD   4 mg at 07/17/11 1557  . promethazine (PHENERGAN) injection 25 mg  25 mg Intramuscular Once Neena Rhymes, MD   25 mg at 07/16/11 1504   No current outpatient prescriptions on file as of 07/17/2011.    Blood pressure 117/88, pulse 91,  temperature 98.1 F (36.7 C), temperature source Oral, resp. rate 22, height 5\' 7"  (1.702 m), weight 114 lb (51.71 kg), SpO2 100.00%. Physical Exam: General: cachetic appearing 56 yo white male who is laying in bed in NAD HEENT: head is normocephalic, atraumatic, cheeks are sunken, PERRL.  Ears and nose without any masses or lesions.  No rhinorrhea.  Mouth is pink Heart: regular, rate, and rhythm, with no murmurs, gallops, or rubs.  Normal s1,s2. Palpable radial and pedal pulses bilaterally Lungs: CTAB, no wheezes, rhonchi, or rales noted.  Respiratory effort is nonlabored Abd: soft, distended, tender in LLQ, +BS.  Midline laparotomy scar noted.  No masses, hernias, or organomegaly noted MS: all 4 extremities are symmetrical with no cyanosis, clubbing, or edema Psych: A&Ox3. Patient has a somewhat flat affect.    Results for orders placed in visit on 07/16/11 (from the past 48 hour(s))  CBC WITH DIFFERENTIAL     Status: Abnormal   Collection Time   07/16/11  3:07 PM      Component Value Range Comment   WBC 3.5 (*) 4.5 - 10.5 (K/uL)    RBC 5.08  4.22 - 5.81 (Mil/uL)    Hemoglobin 14.7  13.0 - 17.0 (g/dL)    HCT 08.6  57.8 - 46.9 (%)  MCV 85.9  78.0 - 100.0 (fl)    MCHC 33.6  30.0 - 36.0 (g/dL)    RDW 16.1  09.6 - 04.5 (%)    Platelets 261.0  150.0 - 400.0 (K/uL)    Neutrophils Relative 74.0  43.0 - 77.0 (%)    Lymphocytes Relative 13.8  12.0 - 46.0 (%)    Monocytes Relative 11.9  3.0 - 12.0 (%)    Eosinophils Relative 0.1  0.0 - 5.0 (%)    Basophils Relative 0.2  0.0 - 3.0 (%)    Neutro Abs 2.6  1.4 - 7.7 (K/uL)    Lymphs Abs 0.5 (*) 0.7 - 4.0 (K/uL)    Monocytes Absolute 0.4  0.1 - 1.0 (K/uL)    Eosinophils Absolute 0.0  0.0 - 0.7 (K/uL)    Basophils Absolute 0.0  0.0 - 0.1 (K/uL)   BASIC METABOLIC PANEL     Status: Abnormal   Collection Time   07/16/11  3:07 PM      Component Value Range Comment   Sodium 134 (*) 135 - 145 (mEq/L)    Potassium 3.5  3.5 - 5.1 (mEq/L)     Chloride 92 (*) 96 - 112 (mEq/L)    CO2 31  19 - 32 (mEq/L)    Glucose, Bld 126 (*) 70 - 99 (mg/dL)    BUN 22  6 - 23 (mg/dL)    Creatinine, Ser 0.8  0.4 - 1.5 (mg/dL)    Calcium 9.4  8.4 - 10.5 (mg/dL)    GFR 409.81  >19.14 (mL/min)   HEPATIC FUNCTION PANEL     Status: Abnormal   Collection Time   07/16/11  3:07 PM      Component Value Range Comment   Total Bilirubin 0.8  0.3 - 1.2 (mg/dL)    Bilirubin, Direct 0.2  0.0 - 0.3 (mg/dL)    Alkaline Phosphatase 50  39 - 117 (U/L)    AST 46 (*) 0 - 37 (U/L)    ALT 29  0 - 53 (U/L)    Total Protein 7.1  6.0 - 8.3 (g/dL)    Albumin 4.3  3.5 - 5.2 (g/dL)   TSH     Status: Normal   Collection Time   07/16/11  3:07 PM      Component Value Range Comment   TSH 1.25  0.35 - 5.50 (uIU/mL)    Dg Abd Acute W/chest  07/17/2011  *RADIOLOGY REPORT*  Clinical Data: Vomiting for 4 days, possible bowel obstruction, abdominal pain  ACUTE ABDOMEN SERIES (ABDOMEN 2 VIEW & CHEST 1 VIEW)  Comparison: Chest and acute abdomen of 09/28/2006  Findings: The lungs are clear.  Mediastinal contours are stable. The heart is within normal limits in size.  Moderate thoracolumbar scoliosis has increased in the interval.  Supine and erect views of the abdomen show dilated loops of small bowel with air-fluid levels consistent with partial small bowel obstruction.  No free air is seen.  Very little colonic bowel gas is seen.  Lumbar scoliosis again is noted.  IMPRESSION:  1.  Partial small bowel obstruction.  No free air. 2.  No active lung disease. 3.  Thoracolumbar scoliosis.  Original Report Authenticated By: Juline Patch, M.D.       Assessment/Plan 1. PSBO, mechanical vs secondary to obstipation 2. Chronic constipation 3. Scoliosis 4. H/o imperforate anus  Plan: 1. We will get the patient admitted.  He will be placed on IVFs and have an NGT placed.  He currently  has a CT scan pending.  This will be helpful in determining whether this obstruction is secondary to  adhesive disease vs constipation.  This will allow [place] to treat him more effectively.  We will repeat abdominal films in the morning.  I did d/w patient and his mother that if his bowel obstruction does not improve with conservative management that he may require an operation.  Everlee Quakenbush E 07/17/2011, 4:45 PM

## 2011-07-17 NOTE — Telephone Encounter (Signed)
Was on my desk but is Dr Beverely Low

## 2011-07-18 ENCOUNTER — Encounter (HOSPITAL_COMMUNITY): Admission: EM | Disposition: A | Discharge status: Home Health | Payer: Self-pay | Source: Home / Self Care

## 2011-07-18 ENCOUNTER — Encounter (HOSPITAL_COMMUNITY): Payer: Self-pay | Admitting: Anesthesiology

## 2011-07-18 ENCOUNTER — Other Ambulatory Visit: Payer: Self-pay

## 2011-07-18 ENCOUNTER — Inpatient Hospital Stay (HOSPITAL_COMMUNITY): Payer: Medicaid Other | Admitting: Anesthesiology

## 2011-07-18 ENCOUNTER — Inpatient Hospital Stay (HOSPITAL_COMMUNITY): Payer: Medicaid Other

## 2011-07-18 HISTORY — PX: LAPAROTOMY: SHX154

## 2011-07-18 LAB — BASIC METABOLIC PANEL
BUN: 23 mg/dL (ref 6–23)
Chloride: 94 mEq/L — ABNORMAL LOW (ref 96–112)
Creatinine, Ser: 0.89 mg/dL (ref 0.50–1.35)
GFR calc Af Amer: >90 mL/min (ref >90)
GFR calc non Af Amer: >90 mL/min (ref >90)

## 2011-07-18 LAB — CBC
MCHC: 34.1 g/dL (ref 30.0–36.0)
Platelets: 258 10*3/uL (ref 150–400)
RDW: 14 % (ref 11.5–15.5)
WBC: 6.8 10*3/uL (ref 4.0–10.5)

## 2011-07-18 SURGERY — LAPAROTOMY, EXPLORATORY
Anesthesia: General | Site: Abdomen | Wound class: Clean Contaminated

## 2011-07-18 MED ORDER — HEPARIN SODIUM (PORCINE) 5000 UNIT/ML IJ SOLN
5000.0000 [IU] | Freq: Three times a day (TID) | INTRAMUSCULAR | Status: DC
  Administered 2011-07-19 – 2011-08-20 (×58): 5000 [IU] via SUBCUTANEOUS
  Filled 2011-07-18 (×100): qty 1

## 2011-07-18 MED ORDER — LIDOCAINE HCL (CARDIAC) 20 MG/ML IV SOLN
INTRAVENOUS | Status: DC | PRN
  Administered 2011-07-18: 80 mg via INTRAVENOUS

## 2011-07-18 MED ORDER — ACETAMINOPHEN 10 MG/ML IV SOLN
INTRAVENOUS | Status: DC | PRN
  Administered 2011-07-18: 1000 mg via INTRAVENOUS

## 2011-07-18 MED ORDER — HYDROMORPHONE HCL PF 1 MG/ML IJ SOLN: 0.2500 mg | INTRAMUSCULAR | Status: DC | PRN

## 2011-07-18 MED ORDER — PROPOFOL 10 MG/ML IV EMUL
INTRAVENOUS | Status: DC | PRN
  Administered 2011-07-18: 150 mg via INTRAVENOUS

## 2011-07-18 MED ORDER — ONDANSETRON HCL 4 MG/2ML IJ SOLN
4.0000 mg | Freq: Four times a day (QID) | INTRAMUSCULAR | Status: DC | PRN
  Administered 2011-07-19 – 2011-07-23 (×5): 4 mg via INTRAVENOUS
  Filled 2011-07-18 (×2): qty 2

## 2011-07-18 MED ORDER — DIPHENHYDRAMINE HCL 12.5 MG/5ML PO ELIX: 12.5000 mg | ORAL_SOLUTION | Freq: Four times a day (QID) | ORAL | Status: DC | PRN

## 2011-07-18 MED ORDER — KCL IN DEXTROSE-NACL 20-5-0.9 MEQ/L-%-% IV SOLN
INTRAVENOUS | Status: DC
  Administered 2011-07-18 – 2011-07-21 (×8): via INTRAVENOUS
  Administered 2011-07-22: 125 mL via INTRAVENOUS
  Administered 2011-07-22 – 2011-07-23 (×2): via INTRAVENOUS
  Administered 2011-07-23: 125 mL via INTRAVENOUS
  Administered 2011-07-24 – 2011-07-27 (×10): via INTRAVENOUS
  Filled 2011-07-18 (×30): qty 1000

## 2011-07-18 MED ORDER — MORPHINE SULFATE (PF) 1 MG/ML IV SOLN
INTRAVENOUS | Status: DC
  Administered 2011-07-18: 18 mg via INTRAVENOUS
  Administered 2011-07-18: 4.5 mg via INTRAVENOUS
  Administered 2011-07-18: 16:00:00 via INTRAVENOUS
  Administered 2011-07-19: 21 mg via INTRAVENOUS
  Administered 2011-07-19: 25 mg via INTRAVENOUS
  Administered 2011-07-19: 4.5 mg via INTRAVENOUS
  Administered 2011-07-20: 3 mg via INTRAVENOUS
  Administered 2011-07-20 (×2): 1.5 mg via INTRAVENOUS
  Administered 2011-07-20: 25 mg via INTRAVENOUS
  Administered 2011-07-21: 3 mg via INTRAVENOUS
  Administered 2011-07-21: 6 mg via INTRAVENOUS
  Administered 2011-07-21: 3 mg via INTRAVENOUS
  Administered 2011-07-21: 5 mg via INTRAVENOUS
  Administered 2011-07-21: 08:00:00 via INTRAVENOUS
  Administered 2011-07-21 (×2): 1.5 mg via INTRAVENOUS
  Administered 2011-07-22: 3 mg via INTRAVENOUS
  Administered 2011-07-22: 1.5 mg via INTRAVENOUS
  Administered 2011-07-22: 12:00:00 via INTRAVENOUS
  Administered 2011-07-22: 1.5 mg via INTRAVENOUS
  Administered 2011-07-24 – 2011-07-25 (×2): 3 mg via INTRAVENOUS
  Filled 2011-07-18 (×6): qty 25

## 2011-07-18 MED ORDER — LACTATED RINGERS IV SOLN
INTRAVENOUS | Status: DC | PRN
  Administered 2011-07-18 (×3): via INTRAVENOUS

## 2011-07-18 MED ORDER — DIPHENHYDRAMINE HCL 50 MG/ML IJ SOLN: 12.5000 mg | Freq: Four times a day (QID) | INTRAMUSCULAR | Status: DC | PRN

## 2011-07-18 MED ORDER — LACTATED RINGERS IV SOLN
INTRAVENOUS | Status: DC | PRN
  Administered 2011-07-18: 12:00:00 via INTRAVENOUS

## 2011-07-18 MED ORDER — DEXAMETHASONE SODIUM PHOSPHATE 10 MG/ML IJ SOLN
INTRAMUSCULAR | Status: DC | PRN
  Administered 2011-07-18: 10 mg via INTRAVENOUS

## 2011-07-18 MED ORDER — ROCURONIUM BROMIDE 100 MG/10ML IV SOLN
INTRAVENOUS | Status: DC | PRN
  Administered 2011-07-18: 30 mg via INTRAVENOUS
  Administered 2011-07-18 (×3): 10 mg via INTRAVENOUS

## 2011-07-18 MED ORDER — GLYCOPYRROLATE 0.2 MG/ML IJ SOLN
INTRAMUSCULAR | Status: DC | PRN
  Administered 2011-07-18: .6 mg via INTRAVENOUS

## 2011-07-18 MED ORDER — SODIUM CHLORIDE 0.9 % IV SOLN
1.0000 g | INTRAVENOUS | Status: AC
  Administered 2011-07-18: 1 g via INTRAVENOUS
  Filled 2011-07-18: qty 1

## 2011-07-18 MED ORDER — NALOXONE HCL 0.4 MG/ML IJ SOLN: 0.4000 mg | INTRAMUSCULAR | Status: DC | PRN

## 2011-07-18 MED ORDER — ONDANSETRON HCL 4 MG PO TABS
4.0000 mg | ORAL_TABLET | Freq: Four times a day (QID) | ORAL | Status: DC | PRN
  Administered 2011-07-25: 4 mg via ORAL
  Filled 2011-07-18: qty 1

## 2011-07-18 MED ORDER — SUCCINYLCHOLINE CHLORIDE 20 MG/ML IJ SOLN
INTRAMUSCULAR | Status: DC | PRN
  Administered 2011-07-18: 100 mg via INTRAVENOUS

## 2011-07-18 MED ORDER — FENTANYL CITRATE 0.05 MG/ML IJ SOLN
INTRAMUSCULAR | Status: DC | PRN
  Administered 2011-07-18: 50 ug via INTRAVENOUS
  Administered 2011-07-18 (×2): 100 ug via INTRAVENOUS

## 2011-07-18 MED ORDER — NEOSTIGMINE METHYLSULFATE 1 MG/ML IJ SOLN
INTRAMUSCULAR | Status: DC | PRN
  Administered 2011-07-18: 3.5 mg via INTRAVENOUS

## 2011-07-18 MED ORDER — FENTANYL CITRATE 0.05 MG/ML IJ SOLN
25.0000 ug | INTRAMUSCULAR | Status: DC | PRN
  Administered 2011-07-18: 25 ug via INTRAVENOUS
  Administered 2011-07-18: 50 ug via INTRAVENOUS
  Administered 2011-07-18: 25 ug via INTRAVENOUS

## 2011-07-18 MED ORDER — HYDROMORPHONE HCL PF 1 MG/ML IJ SOLN
INTRAMUSCULAR | Status: DC | PRN
  Administered 2011-07-18: 1 mg via INTRAVENOUS

## 2011-07-18 MED ORDER — ONDANSETRON HCL 4 MG/2ML IJ SOLN
4.0000 mg | Freq: Four times a day (QID) | INTRAMUSCULAR | Status: DC | PRN
  Filled 2011-07-18 (×5): qty 2

## 2011-07-18 MED ORDER — PROMETHAZINE HCL 25 MG/ML IJ SOLN: 6.2500 mg | INTRAMUSCULAR | Status: DC | PRN

## 2011-07-18 MED ORDER — MIDAZOLAM HCL 5 MG/5ML IJ SOLN
INTRAMUSCULAR | Status: DC | PRN
  Administered 2011-07-18: 2 mg via INTRAVENOUS

## 2011-07-18 MED ORDER — SODIUM CHLORIDE 0.9 % IJ SOLN: 9.0000 mL | INTRAMUSCULAR | Status: DC | PRN

## 2011-07-18 MED ORDER — 0.9 % SODIUM CHLORIDE (POUR BTL) OPTIME
TOPICAL | Status: DC | PRN
  Administered 2011-07-18: 2000 mL

## 2011-07-18 MED ORDER — ONDANSETRON HCL 4 MG/2ML IJ SOLN
INTRAMUSCULAR | Status: DC | PRN
  Administered 2011-07-18: 4 mg via INTRAVENOUS

## 2011-07-18 SURGICAL SUPPLY — 36 items
APPLICATOR COTTON TIP 6IN STRL (MISCELLANEOUS) ×2 IMPLANT
BLADE EXTENDED COATED 6.5IN (ELECTRODE) IMPLANT
BLADE HEX COATED 2.75 (ELECTRODE) ×2 IMPLANT
CANISTER SUCTION 2500CC (MISCELLANEOUS) ×2 IMPLANT
CLOTH BEACON ORANGE TIMEOUT ST (SAFETY) ×2 IMPLANT
COVER MAYO STAND STRL (DRAPES) ×2 IMPLANT
DRAPE LAPAROSCOPIC ABDOMINAL (DRAPES) ×2 IMPLANT
DRAPE WARM FLUID 44X44 (DRAPE) ×2 IMPLANT
ELECT REM PT RETURN 9FT ADLT (ELECTROSURGICAL) ×2
ELECTRODE REM PT RTRN 9FT ADLT (ELECTROSURGICAL) ×1 IMPLANT
GAUZE SPONGE 4X4 12PLY STRL LF (GAUZE/BANDAGES/DRESSINGS) ×2 IMPLANT
GLOVE BIOGEL PI IND STRL 7.0 (GLOVE) ×1 IMPLANT
GLOVE BIOGEL PI INDICATOR 7.0 (GLOVE) ×1
GOWN STRL NON-REIN LRG LVL3 (GOWN DISPOSABLE) ×2 IMPLANT
GOWN STRL REIN XL XLG (GOWN DISPOSABLE) ×4 IMPLANT
KIT BASIN OR (CUSTOM PROCEDURE TRAY) ×2 IMPLANT
NS IRRIG 1000ML POUR BTL (IV SOLUTION) ×4 IMPLANT
PACK GENERAL/GYN (CUSTOM PROCEDURE TRAY) ×2 IMPLANT
SEPRAFILM PROCEDURAL PACK 3X5 (MISCELLANEOUS) ×2 IMPLANT
SPONGE GAUZE 4X4 12PLY (GAUZE/BANDAGES/DRESSINGS) ×2 IMPLANT
SPONGE LAP 18X18 X RAY DECT (DISPOSABLE) ×2 IMPLANT
STAPLER PROXIMATE 75MM BLUE (STAPLE) ×2 IMPLANT
STAPLER VISISTAT 35W (STAPLE) ×2 IMPLANT
SUCTION POOLE TIP (SUCTIONS) ×2 IMPLANT
SUT PDS AB 1 CTX 36 (SUTURE) IMPLANT
SUT PDS AB 1 TP1 96 (SUTURE) ×4 IMPLANT
SUT SILK 2 0 (SUTURE)
SUT SILK 2 0 SH CR/8 (SUTURE) ×4 IMPLANT
SUT SILK 2-0 18XBRD TIE 12 (SUTURE) IMPLANT
SUT SILK 3 0 (SUTURE)
SUT SILK 3 0 SH CR/8 (SUTURE) ×2 IMPLANT
SUT SILK 3-0 18XBRD TIE 12 (SUTURE) IMPLANT
TAPE HYPAFIX 4 X10 (GAUZE/BANDAGES/DRESSINGS) ×2 IMPLANT
TOWEL OR 17X26 10 PK STRL BLUE (TOWEL DISPOSABLE) ×4 IMPLANT
TRAY FOLEY CATH 14FRSI W/METER (CATHETERS) ×2 IMPLANT
YANKAUER SUCT BULB TIP NO VENT (SUCTIONS) ×2 IMPLANT

## 2011-07-18 NOTE — Progress Notes (Signed)
Patient ID: [name], male   DOB: 09-29-1955, 56 y.o.   MRN: [id number]    Subjective: Feels "bad". Denies abdominal pain but states he feels weak and nervous  And generally ill. He has not had any bowel movement or flatus. He is belching.  Objective: Vital signs in last 24 hours: Temp:  [97.5 F (36.4 C)-98.4 F (36.9 C)] 97.9 F (36.6 C) (02/23 0944) Pulse Rate:  [81-93] 88  (02/23 0944) Resp:  [18-22] 18  (02/23 0944) BP: (114-118)/(77-88) 118/82 mmHg (02/23 0944) SpO2:  [95 %-100 %] 96 % (02/23 0944) Weight:  [114 lb (51.71 kg)] 114 lb (51.71 kg) (02/22 1251)    Intake/Output from previous day: 02/22 0701 - 02/23 0700 In: -  Out: 1900 [Emesis/NG output:500] Intake/Output this shift:    General appearance: alert and mild distress.  very thin. GI: abnormal findings:  Distended, but non-tender. Long midline incision without hernias. Bilious drainage from NG tube.  Lab Results:   Basename 07/18/11 0510 07/17/11 1520  WBC 6.8 7.0  HGB 14.3 16.1  HCT 41.9 45.5  PLT 258 297   BMET  Basename 07/18/11 0510 07/17/11 1520  NA 135 134*  K 3.4* 3.2*  CL 94* 89*  CO2 35* 30  GLUCOSE 130* 119*  BUN 23 31*  CREATININE 0.89 0.79  CALCIUM 9.4 10.1     Studies/Results: Ct Abdomen Pelvis W Contrast  07/17/2011  *RADIOLOGY REPORT*  Clinical Data: Small bowel obstruction, evaluate for transition point  CT ABDOMEN AND PELVIS WITH CONTRAST  Technique:  Multidetector CT imaging of the abdomen and pelvis was performed following the standard protocol during bolus administration of intravenous contrast.  Contrast: 80mL OMNIPAQUE IOHEXOL 300 MG/ML IV SOLN  Comparison: Abdominal radiographs dated 07/17/2011  Findings: Lung bases are clear.  Gastric distension.  Enteric tube terminating in the proximal gastric body.  Liver, spleen, pancreas, and adrenal glands are within normal limits.  Gallbladder is unremarkable.  No intrahepatic or extrahepatic ductal dilatation.  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  Right kidney is within normal limits. No hydronephrosis.  Dilated loops of proximal small bowel, with abrupt transition/focal narrowing in the left mid abdomen (series 2/image 44), suggesting high-grade partial or early complete small bowel obstruction. Colon is not yet decompressed.  No evidence of abdominal aortic aneurysm.  No abdominopelvic ascites.  No suspicious abdominopelvic lymphadenopathy.  Prostate is unremarkable.  Bladder is displaced to the left but grossly unremarkable.  Degenerative changes of the visualized thoracolumbar spine.  IMPRESSION: Dilated loops of proximal small bowel, with abrupt narrowing/focal transition in the left mid abdomen, compatible with high-grade partial/early complete small bowel obstruction.  Gastric distension.  Enteric tube terminating in the proximal gastric body.  Original Report Authenticated By: Charline Bills, M.D.   Dg Abd 2 Views  07/18/2011  *RADIOLOGY REPORT*  Clinical Data: Abdominal pain and nausea.  Follow-up small bowel obstruction.  ABDOMEN - 2 VIEW  Comparison: 07/17/2011  Findings: Nasogastric tube is again seen within the proximal stomach.  Increased dilatation of small bowel loops is seen containing multiple air-fluid levels.  There is persistent stool and bowel gas seen within the colon which is nondilated.  These findings consistent with a progressive small bowel obstruction. There is no evidence of free air.  Contrast is now seen within the urinary bladder from recent CT.  IMPRESSION: Small bowel obstruction, with increased small bowel dilatation since prior exam.  Original Report Authenticated By: Danae Orleans, M.D.   Dg Abd Acute W/chest  07/17/2011  *RADIOLOGY REPORT*  Clinical Data: Vomiting for 4 days, possible bowel obstruction, abdominal pain  ACUTE ABDOMEN SERIES (ABDOMEN 2 VIEW & CHEST 1 VIEW)  Comparison: Chest and acute abdomen of 09/28/2006  Findings: The lungs are clear.  Mediastinal contours are stable. The heart is  within normal limits in size.  Moderate thoracolumbar scoliosis has increased in the interval.  Supine and erect views of the abdomen show dilated loops of small bowel with air-fluid levels consistent with partial small bowel obstruction.  No free air is seen.  Very little colonic bowel gas is seen.  Lumbar scoliosis again is noted.  IMPRESSION:  1.  Partial small bowel obstruction.  No free air. 2.  No active lung disease. 3.  Thoracolumbar scoliosis.  Original Report Authenticated By: Juline Patch, M.D.    Anti-infectives: Anti-infectives    None      Assessment/Plan: Mechanical small bowel obstruction with history of laparotomy for correction of imperforate anus as an infant. Initial CT scan showed very high grade or complete small bowel obstruction. X-rays today show worsening small bowel obstruction with increased distention and markedly dilated loops. He has no clinical evidence of improvement. I have recommended proceeding with laparotomy. I discussed the procedure and its indications with the patient and his mother. The procedure was described including risks of anesthetic complications, bleeding, infection, possible need for bowel resection. We discussed the alternative of continued NG suction and observation which I think would carry a significant risk of complications. They understand and agree to surgery.    LOS: 1 day    Tamme Mozingo T 07/18/2011

## 2011-07-18 NOTE — Anesthesia Postprocedure Evaluation (Signed)
  Anesthesia Post-op Note  Patient: [name]  Procedure(s) Performed: Procedure(s) (LRB): EXPLORATORY LAPAROTOMY (N/A)  Patient Location: PACU  Anesthesia Type: General  Level of Consciousness: sedated, patient cooperative and responds to stimulation  Airway and Oxygen Therapy: Patient Spontanous Breathing and Patient connected to nasal cannula oxygen  Post-op Pain: mild  Post-op Assessment: Post-op Vital signs reviewed, Patient's Cardiovascular Status Stable, Respiratory Function Stable, Patent Airway, No signs of Nausea or vomiting and Pain level controlled  Post-op Vital Signs: Reviewed and stable  Complications: No apparent anesthesia complications

## 2011-07-18 NOTE — Anesthesia Preprocedure Evaluation (Addendum)
Anesthesia Evaluation  Patient identified by MRN, date of birth, ID band Patient awake  General Assessment Comment:Chronic constipation  Reviewed: Allergy & Precautions, H&P , NPO status , Patient's Chart, lab work & pertinent test results  Airway Mallampati: II TM Distance: >3 FB Neck ROM: Full    Dental No notable dental hx.    Pulmonary neg pulmonary ROS,  clear to auscultation  Pulmonary exam normal       Cardiovascular neg cardio ROS Regular Normal    Neuro/Psych Anxiety Depression Negative Neurological ROS     GI/Hepatic Neg liver ROS, PUD,   Endo/Other  Negative Endocrine ROS  Renal/GU negative Renal ROS  Genitourinary negative   Musculoskeletal negative musculoskeletal ROS (+)   Abdominal   Peds negative pediatric ROS (+)  Hematology negative hematology ROS (+)   Anesthesia Other Findings   Reproductive/Obstetrics negative OB ROS                           Anesthesia Physical Anesthesia Plan  ASA: II and Emergent  Anesthesia Plan: General   Post-op Pain Management:    Induction: Intravenous and Rapid sequence  Airway Management Planned: Oral ETT  Additional Equipment:   Intra-op Plan:   Post-operative Plan: Extubation in OR  Informed Consent: I have reviewed the patients History and Physical, chart, labs and discussed the procedure including the risks, benefits and alternatives for the proposed anesthesia with the patient or authorized representative who has indicated his/her understanding and acceptance.   Dental advisory given  Plan Discussed with: CRNA  Anesthesia Plan Comments:         Anesthesia Quick Evaluation

## 2011-07-18 NOTE — Preoperative (Signed)
Beta Blockers   Reason not to administer Beta Blockers:Not Applicable 

## 2011-07-18 NOTE — Op Note (Signed)
Preoperative Diagnosis: Small bowel obstruction [560.9]  Postoprative Diagnosis: Small bowel obstruction [560.9]   Procedure: Procedure(s): EXPLORATORY LAPAROTOMY and small bowel bypass   Surgeon: Glenna Fellows T   Assistants: Chevis Pretty  Anesthesia:  General endotracheal anesthesiaDiagnos  Indications:  Patient is a 56 year old male with a history of repair of imperforate anus at age 47. He presents with acute abdominal pain and vomiting and CT scan shows a high grade to complete small bowel obstruction in the mid jejunum. After 24 hours of bowel rest with NG suction x-rays show worsening of his small bowel distention. I have recommended proceeding with laparotomy for small bowel obstruction. I discussed the nature of the procedure and its indications with the patient and his mother and risks of bleeding, infection, anesthetic complications, recurrent obstruction were discussed and understood. Alternatives of continued nonoperative management were discussed. They understand and agree to proceed.   Procedure Detail: Patient is brought to the operating room, placed in the supine position on the operating table, and general endotracheal anesthesia induced. A Foley catheter was placed. The abdomen was widely sterilely prepped and draped. Patient's time out was performed and correct procedure verified. PAS were in place. He had received preoperative broad-spectrum IV antibiotics. The previous incision just to the left of the midline skirting the umbilicus was used and dissection carried down through the subcutaneous tissue and midline fascia. Dense adhesions of bowel to the underlying abdominal wall were encountered. With careful and tedious sharp dissection the incision was opened along its length and adhesions of small bowel and transverse colon were taken down from the anterior abdominal wall. Eventually the entire anterior abdominal wall was freed. There were extensive interloop adhesions  throughout most of the abdomen. There were clearly markedly dilated loops of proximal small bowel in the left upper quadrant and completely decompressed small bowel in the right lower quadrant. An extensive adhesio lysis was then performed to delineate the problem. As we dissected down the markedly dilated proximal bowel we came to a dense area of adhesion in the mid jejunum as expected where there was a dense stellate scar that had contracted and narrowed the jejunum. This was freed up and was clearly the point of obstruction was completely decompressed distal bowel. This was freed up as much as possible but there was still scarring and narrowing of the intestine to where we were not comfortable that this could be opened with adhesio lysis only. Further adhesions were taken down proximally and distally to further examine the bowel and this was clearly the point of obstruction. I did not completely lyse all adhesions but just to the point that we could examine all of the bowel and be sure this was the only point of obstruction. Due to the narrowing in this area I elected to perform an entero enterostomy. A GIA 75 mm stapler was used through 2 small enterotomies just proximal and distal to the stricture to perform a wide side-to-side bypass. The staple line was intact and without bleeding. The common enterotomy was then closed with interrupted 2-0 silk sutures. The crotch of the staple line was reinforced with a 2-0 silk suture. The anastomosis was widely patent and under no tension and was airtight to pressure. At this point gloves and instruments were changed and the abdomen was irrigated and hemostasis assured. The viscera returned to their anatomic position. Seprafilm was placed over the viscera anteriorly beneath the abdominal wall. The midline fascia was then closed with running looped #1 PDS begun at either  end of the incision and tied centrally. The subcutaneous tissue was irrigated and the skin closed with  staples. Sponge needle and instrument counts were correct.   Findings: Small bowel obstruction with stricture secondary to adhesions  Estimated Blood Loss:  less than 100 mL         Drains: none  Blood Given: none          Specimens: none        Complications:  * No complications entered in OR log *         Disposition: PACU - hemodynamically stable.         Condition: stable  Mariella Saa MD, FACS  07/18/2011, 3:15 PM

## 2011-07-18 NOTE — Transfer of Care (Signed)
Immediate Anesthesia Transfer of Care Note  Patient: [name]  Procedure(s) Performed: Procedure(s) (LRB): EXPLORATORY LAPAROTOMY (N/A)  Patient Location: PACU  Anesthesia Type: General  Level of Consciousness: awake, alert , oriented and patient cooperative  Airway & Oxygen Therapy: Patient Spontanous Breathing and Patient connected to face mask oxygen  Post-op Assessment: Report given to PACU RN, Post -op Vital signs reviewed and stable and Patient moving all extremities  Post vital signs: Reviewed and stable  Complications: No apparent anesthesia complications

## 2011-07-19 LAB — BASIC METABOLIC PANEL
CO2: 31 mEq/L (ref 19–32)
Calcium: 8.5 mg/dL (ref 8.4–10.5)
Chloride: 102 mEq/L (ref 96–112)
Glucose, Bld: 148 mg/dL — ABNORMAL HIGH (ref 70–99)
Potassium: 4.3 mEq/L (ref 3.5–5.1)
Sodium: 138 mEq/L (ref 135–145)

## 2011-07-19 LAB — CBC
HCT: 40.7 % (ref 39.0–52.0)
Hemoglobin: 13.5 g/dL (ref 13.0–17.0)
MCV: 85.1 fL (ref 78.0–100.0)
Platelets: 268 10*3/uL (ref 150–400)
RBC: 4.78 MIL/uL (ref 4.22–5.81)
WBC: 8.5 10*3/uL (ref 4.0–10.5)

## 2011-07-19 MED ORDER — PHENOL 1.4 % MT LIQD
1.0000 | OROMUCOSAL | Status: DC | PRN
  Administered 2011-07-23: 1 via OROMUCOSAL
  Filled 2011-07-19: qty 177

## 2011-07-19 MED ORDER — MENTHOL 3 MG MT LOZG
1.0000 | LOZENGE | OROMUCOSAL | Status: DC | PRN
  Administered 2011-07-19 – 2011-07-20 (×2): 3 mg via ORAL
  Filled 2011-07-19 (×4): qty 9

## 2011-07-19 NOTE — Progress Notes (Signed)
Patient ID: [name], male   DOB: 10-Apr-1956, 56 y.o.   MRN: [id number] 1 Day Post-Op  Subjective: No complaints this morning. Good pain control with medications.  Objective: Vital signs in last 24 hours: Temp:  [96.7 F (35.9 C)-98.4 F (36.9 C)] 96.7 F (35.9 C) (02/24 0459) Pulse Rate:  [72-98] 88  (02/24 0459) Resp:  [14-18] 18  (02/24 0737) BP: (118-137)/(77-91) 126/83 mmHg (02/24 0459) SpO2:  [96 %-100 %] 98 % (02/24 0737) Last BM Date: 07/18/11  Intake/Output from previous day: 02/23 0701 - 02/24 0700 In: 4302 [I.V.:4300] Out: 3150 [Urine:2075; Emesis/NG output:675] Intake/Output this shift:    General appearance: alert, cooperative and no distress GI: normal findings: soft, non-tender Incision/Wound:dressing clean and dry  Lab Results:   Basename 07/19/11 0430 07/18/11 0510  WBC 8.5 6.8  HGB 13.5 14.3  HCT 40.7 41.9  PLT 268 258   BMET  Basename 07/19/11 0430 07/18/11 0510  NA 138 135  K 4.3 3.4*  CL 102 94*  CO2 31 35*  GLUCOSE 148* 130*  BUN 14 23  CREATININE 0.80 0.89  CALCIUM 8.5 9.4     Studies/Results: Ct Abdomen Pelvis W Contrast  07/17/2011  *RADIOLOGY REPORT*  Clinical Data: Small bowel obstruction, evaluate for transition point  CT ABDOMEN AND PELVIS WITH CONTRAST  Technique:  Multidetector CT imaging of the abdomen and pelvis was performed following the standard protocol during bolus administration of intravenous contrast.  Contrast: 80mL OMNIPAQUE IOHEXOL 300 MG/ML IV SOLN  Comparison: Abdominal radiographs dated 07/17/2011  Findings: Lung bases are clear.  Gastric distension.  Enteric tube terminating in the proximal gastric body.  Liver, spleen, pancreas, and adrenal glands are within normal limits.  Gallbladder is unremarkable.  No intrahepatic or extrahepatic ductal dilatation.  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  Right kidney is within normal limits. No hydronephrosis.  Dilated loops of proximal small bowel, with abrupt transition/focal  narrowing in the left mid abdomen (series 2/image 44), suggesting high-grade partial or early complete small bowel obstruction. Colon is not yet decompressed.  No evidence of abdominal aortic aneurysm.  No abdominopelvic ascites.  No suspicious abdominopelvic lymphadenopathy.  Prostate is unremarkable.  Bladder is displaced to the left but grossly unremarkable.  Degenerative changes of the visualized thoracolumbar spine.  IMPRESSION: Dilated loops of proximal small bowel, with abrupt narrowing/focal transition in the left mid abdomen, compatible with high-grade partial/early complete small bowel obstruction.  Gastric distension.  Enteric tube terminating in the proximal gastric body.  Original Report Authenticated By: Charline Bills, M.D.   Dg Abd 2 Views  07/18/2011  *RADIOLOGY REPORT*  Clinical Data: Abdominal pain and nausea.  Follow-up small bowel obstruction.  ABDOMEN - 2 VIEW  Comparison: 07/17/2011  Findings: Nasogastric tube is again seen within the proximal stomach.  Increased dilatation of small bowel loops is seen containing multiple air-fluid levels.  There is persistent stool and bowel gas seen within the colon which is nondilated.  These findings consistent with a progressive small bowel obstruction. There is no evidence of free air.  Contrast is now seen within the urinary bladder from recent CT.  IMPRESSION: Small bowel obstruction, with increased small bowel dilatation since prior exam.  Original Report Authenticated By: Danae Orleans, M.D.   Dg Abd Acute W/chest  07/17/2011  *RADIOLOGY REPORT*  Clinical Data: Vomiting for 4 days, possible bowel obstruction, abdominal pain  ACUTE ABDOMEN SERIES (ABDOMEN 2 VIEW & CHEST 1 VIEW)  Comparison: Chest and acute abdomen of 09/28/2006  Findings:  The lungs are clear.  Mediastinal contours are stable. The heart is within normal limits in size.  Moderate thoracolumbar scoliosis has increased in the interval.  Supine and erect views of the abdomen show  dilated loops of small bowel with air-fluid levels consistent with partial small bowel obstruction.  No free air is seen.  Very little colonic bowel gas is seen.  Lumbar scoliosis again is noted.  IMPRESSION:  1.  Partial small bowel obstruction.  No free air. 2.  No active lung disease. 3.  Thoracolumbar scoliosis.  Original Report Authenticated By: Juline Patch, M.D.    Anti-infectives: Anti-infectives     Start     Dose/Rate Route Frequency Ordered Stop   07/18/11 1135   ertapenem (INVANZ) 1 g in sodium chloride 0.9 % 50 mL IVPB        1 g 100 mL/hr over 30 Minutes Intravenous 60 min pre-op 07/18/11 1135 07/18/11 1235          Assessment/Plan: s/p Procedure(s): EXPLORATORY LAPAROTOMY Doing well following laparotomy and small bowel bypass for small bowel obstruction. Admitted today and work on pulmonary toilet. He may have significant ileus postop. Consider TNA as he is underweight to coming into surgery.   LOS: 2 days    Shafin Pollio T 07/19/2011

## 2011-07-19 NOTE — Telephone Encounter (Signed)
Pt is in the hospital and scheduled for surgery to resolve bowel obstruction.  Pt will need hospital f/u w/ Dr Drue Novel at time of d/c as this is his pt.

## 2011-07-20 MED ORDER — ACETAMINOPHEN 325 MG PO TABS: 650.0000 mg | ORAL_TABLET | ORAL | Status: DC | PRN

## 2011-07-20 MED ORDER — MUPIROCIN 2 % EX OINT
1.0000 "application " | TOPICAL_OINTMENT | Freq: Two times a day (BID) | CUTANEOUS | Status: AC
  Administered 2011-07-20 – 2011-07-24 (×10): 1 via NASAL
  Filled 2011-07-20: qty 22

## 2011-07-20 MED ORDER — CHLORHEXIDINE GLUCONATE CLOTH 2 % EX PADS
6.0000 | MEDICATED_PAD | Freq: Every day | CUTANEOUS | Status: AC
  Administered 2011-07-20 – 2011-07-23 (×4): 6 via TOPICAL

## 2011-07-20 NOTE — Progress Notes (Signed)
No flatus. Pain controlled. Hi ng tube output.  Cont ng tube to liws Ambulate.   [name]. [name], MD, FACS General, Bariatric, & Minimally Invasive Surgery [facility], [place]

## 2011-07-20 NOTE — Progress Notes (Signed)
Patient ID: [name], male   DOB: July 15, 1955, 56 y.o.   MRN: [id number] 2 Days Post-Op  Subjective: Pt feels well today.  No flatus yet.  Objective: Vital signs in last 24 hours: Temp:  [97.3 F (36.3 C)-99.7 F (37.6 C)] 99.6 F (37.6 C) (02/25 0447) Pulse Rate:  [94-104] 96  (02/25 0447) Resp:  [16-18] 18  (02/25 0447) BP: (119-139)/(82-94) 123/87 mmHg (02/25 0447) SpO2:  [2 %-100 %] 96 % (02/25 0447) Last BM Date: 07/18/11  Intake/Output from previous day: 02/24 0701 - 02/25 0700 In: 3005 [P.O.:3; I.V.:3000] Out: 2275 [Urine:1125; Emesis/NG output:1150] Intake/Output this shift: Total I/O In: -  Out: 425 [Urine:425]  PE: Abd: soft, mildly tender, -bs, ND, NGT with bilious output. Incision c/d/i with staples  Lab Results:   Basename 07/19/11 0430 07/18/11 0510  WBC 8.5 6.8  HGB 13.5 14.3  HCT 40.7 41.9  PLT 268 258   BMET  Basename 07/19/11 0430 07/18/11 0510  NA 138 135  K 4.3 3.4*  CL 102 94*  CO2 31 35*  GLUCOSE 148* 130*  BUN 14 23  CREATININE 0.80 0.89  CALCIUM 8.5 9.4   PT/INR No results found for this basename: LABPROT:2,INR:2 in the last 72 hours   Studies/Results: No results found.  Anti-infectives: Anti-infectives     Start     Dose/Rate Route Frequency Ordered Stop   07/18/11 1135   ertapenem (INVANZ) 1 g in sodium chloride 0.9 % 50 mL IVPB        1 g 100 mL/hr over 30 Minutes Intravenous 60 min pre-op 07/18/11 1135 07/18/11 1235           Assessment/Plan  1. SBO, s/p ex lap with entero-enterostomy bypass and LOA 2. Post-op ileus  Plan: 1. Cont NGT and await bowel function   LOS: 3 days    Marquita Lias E 07/20/2011

## 2011-07-21 NOTE — Progress Notes (Signed)
+  flatus. Started ng clamping trials today. Some pain since clamped.   Soft. Some distension.  Await return of bowel function Cont clamping trial.  [name]. [name], MD, FACS General, Bariatric, & Minimally Invasive Surgery [facility], [place]

## 2011-07-21 NOTE — Progress Notes (Signed)
Patient ID: [name], male   DOB: March 06, 1956, 56 y.o.   MRN: [id number] 3 Days Post-Op  Subjective: Pt feels ok.  C/o sore throat.  Passed small amount of flatus.  No BM  Objective: Vital signs in last 24 hours: Temp:  [98.5 F (36.9 C)-98.6 F (37 C)] 98.6 F (37 C) (02/26 0600) Pulse Rate:  [99-102] 102  (02/26 0600) Resp:  [14-18] 18  (02/26 0738) BP: (124-133)/(79-87) 124/84 mmHg (02/26 0600) SpO2:  [98 %-100 %] 99 % (02/26 0738) FiO2 (%):  [100 %] 100 % (02/26 0000) Last BM Date: 07/08/11  Intake/Output from previous day: 02/25 0701 - 02/26 0700 In: 1477.5 [I.V.:1477.5] Out: 2355 [Urine:2355] Intake/Output this shift:    PE: Abd: soft, mildly tender, few BS, ND, incision c/d/i with staples.  NGT with minimal output.  Lab Results:   Basename 07/19/11 0430  WBC 8.5  HGB 13.5  HCT 40.7  PLT 268   BMET  Basename 07/19/11 0430  NA 138  K 4.3  CL 102  CO2 31  GLUCOSE 148*  BUN 14  CREATININE 0.80  CALCIUM 8.5   PT/INR No results found for this basename: LABPROT:2,INR:2 in the last 72 hours   Studies/Results: No results found.  Anti-infectives: Anti-infectives     Start     Dose/Rate Route Frequency Ordered Stop   07/18/11 1135   ertapenem (INVANZ) 1 g in sodium chloride 0.9 % 50 mL IVPB        1 g 100 mL/hr over 30 Minutes Intravenous 60 min pre-op 07/18/11 1135 07/18/11 1235           Assessment/Plan  1. S/p ex lap with entero-enterostomy bypass for SBO 2. Post-op ileus  Plan: 1. Will try clamping cycles today.  If tolerates will look to dc NGT tomorrow 2. Pt MUST get OOB and ambulate TID in the halls.   LOS: 4 days    [name] E 07/21/2011

## 2011-07-22 MED ORDER — VITAMINS A & D EX OINT
TOPICAL_OINTMENT | CUTANEOUS | Status: AC
  Administered 2011-07-22: 04:00:00
  Filled 2011-07-22: qty 5

## 2011-07-22 NOTE — Progress Notes (Signed)
Patient ID: [name], male   DOB: September 18, 1955, 56 y.o.   MRN: [id number] 4 Days Post-Op  Subjective: Pt did not tolerate NG clamping trials yesterday.  He is back to continuous suction.  Objective: Vital signs in last 24 hours: Temp:  [98.2 F (36.8 C)-98.4 F (36.9 C)] 98.4 F (36.9 C) (02/27 0544) Pulse Rate:  [88-95] 88  (02/27 0544) Resp:  [17-21] 17  (02/27 0759) BP: (111-124)/(76-89) 119/77 mmHg (02/27 0544) SpO2:  [96 %-100 %] 99 % (02/27 0759) Last BM Date: 07/08/11  Intake/Output from previous day: 02/26 0701 - 02/27 0700 In: 1000 [I.V.:1000] Out: 1800 [Urine:1150; Emesis/NG output:650] Intake/Output this shift: Total I/O In: -  Out: 350 [Urine:350]  PE: Abd: soft, tender, few BS, ND, incision c/d/i with staples.  Lab Results:  No results found for this basename: WBC:2,HGB:2,HCT:2,PLT:2 in the last 72 hours BMET No results found for this basename: NA:2,K:2,CL:2,CO2:2,GLUCOSE:2,BUN:2,CREATININE:2,CALCIUM:2 in the last 72 hours PT/INR No results found for this basename: LABPROT:2,INR:2 in the last 72 hours   Studies/Results: No results found.  Anti-infectives: Anti-infectives     Start     Dose/Rate Route Frequency Ordered Stop   07/18/11 1135   ertapenem (INVANZ) 1 g in sodium chloride 0.9 % 50 mL IVPB        1 g 100 mL/hr over 30 Minutes Intravenous 60 min pre-op 07/18/11 1135 07/18/11 1235           Assessment/Plan  1. S/p ex lap with entero-enterostomy bypass for SBO 2. Post-op ileus  Plan: 1. Continue continuous suction for today. Await bowel function 2. Cont to ambualte   LOS: 5 days    Shreya Lacasse E 07/22/2011

## 2011-07-22 NOTE — Progress Notes (Signed)
+  flatus.   Soft, min distension, hypoBS NG tube - brown, appears feculent  Cont bowel rest. Cont ng tube to liws. Cont TPN  [name]. [name], MD, FACS General, Bariatric, & Minimally Invasive Surgery [facility], [place]

## 2011-07-23 DIAGNOSIS — K5900 Constipation, unspecified: Secondary | ICD-10-CM

## 2011-07-23 MED ORDER — SORBITOL 70 % SOLN
960.0000 mL | TOPICAL_OIL | Freq: Once | ORAL | Status: AC
  Administered 2011-07-23: 960 mL via RECTAL
  Filled 2011-07-23: qty 240

## 2011-07-23 NOTE — Progress Notes (Signed)
Patient ID: [name], male   DOB: 1956-03-18, 56 y.o.   MRN: [id number] 5 Days Post-Op  Subjective: Pt reports some discomfort as if he needs to have a BM.  He otherwise is stable.  Objective: Vital signs in last 24 hours: Temp:  [98.3 F (36.8 C)-98.6 F (37 C)] 98.4 F (36.9 C) (02/28 0532) Pulse Rate:  [85-100] 85  (02/28 0532) Resp:  [16-20] 18  (02/28 0532) BP: (120-133)/(78-83) 120/78 mmHg (02/28 0532) SpO2:  [94 %-99 %] 97 % (02/28 0532) Last BM Date: 07/08/11  Intake/Output from previous day: 02/27 0701 - 02/28 0700 In: -  Out: 3600 [Urine:2000; Emesis/NG output:1600] Intake/Output this shift: Total I/O In: -  Out: 200 [Urine:200]  PE: Abd: soft, few BS, incision c/d/i with staples, ND  Lab Results:  No results found for this basename: WBC:2,HGB:2,HCT:2,PLT:2 in the last 72 hours BMET No results found for this basename: NA:2,K:2,CL:2,CO2:2,GLUCOSE:2,BUN:2,CREATININE:2,CALCIUM:2 in the last 72 hours PT/INR No results found for this basename: LABPROT:2,INR:2 in the last 72 hours   Studies/Results: No results found.  Anti-infectives: Anti-infectives     Start     Dose/Rate Route Frequency Ordered Stop   07/18/11 1135   ertapenem (INVANZ) 1 g in sodium chloride 0.9 % 50 mL IVPB        1 g 100 mL/hr over 30 Minutes Intravenous 60 min pre-op 07/18/11 1135 07/18/11 1235           Assessment/Plan  1. S/p ex lap with entero-enterostomy bypass secondary to adhesions 2. Post op ileus  3. Chronic constipation  Plan: 1. Given the patient's significant history of constipation, it may be unlikely he is going to start moving things through on his own without some help trying to clear his stool burden first.  Therefore, we will given him an enema today and start working from below to see if that will help things up top. 2. Will try to cycle clamp his NGT again today and see how that works for him.  LOS: 6 days    Trenisha Lafavor E 07/23/2011

## 2011-07-23 NOTE — Progress Notes (Signed)
abd soft, nd. Still with fair amount of ng tube output.  Ng clamping trials as tolerated.  Await return of bowel function.  [name]. [name], MD, FACS General, Bariatric, & Minimally Invasive Surgery [facility], [place]

## 2011-07-24 DIAGNOSIS — E46 Unspecified protein-calorie malnutrition: Secondary | ICD-10-CM

## 2011-07-24 MED ORDER — ONDANSETRON HCL 4 MG/2ML IJ SOLN
4.0000 mg | INTRAMUSCULAR | Status: DC | PRN
Start: 2011-07-24 — End: 2011-07-25
  Administered 2011-07-25: 4 mg via INTRAVENOUS

## 2011-07-24 MED ORDER — FLEET ENEMA 7-19 GM/118ML RE ENEM
1.0000 | ENEMA | Freq: Once | RECTAL | Status: AC
  Administered 2011-07-24: 1 via RECTAL
  Filled 2011-07-24: qty 1

## 2011-07-24 MED ORDER — BISACODYL 10 MG RE SUPP
10.0000 mg | Freq: Once | RECTAL | Status: AC
  Administered 2011-07-24: 10 mg via RECTAL
  Filled 2011-07-24: qty 1

## 2011-07-24 NOTE — Progress Notes (Signed)
No pain. No flatus. Very little results with enema  abd soft, nd, min TTP. Very quiet.  Cont NG tube Cont bowel rest If doesn't open in next day or so- will need picc/tpn  [name]. [name], MD, FACS General, Bariatric, & Minimally Invasive Surgery [facility], [place]

## 2011-07-24 NOTE — Progress Notes (Signed)
Patient ID: [name], male   DOB: [birth year], 56 y.o.   MRN: [id number] 6 Days Post-Op  Subjective: Pt tolerated clamping trials ok until night when he developed some nausea.  Had minimal BMs with SMOG enema yesterday.  Passing little flatus.  Objective: Vital signs in last 24 hours: Temp:  [98.2 F (36.8 C)-98.9 F (37.2 C)] 98.2 F (36.8 C) (03/01 0600) Pulse Rate:  [80-81] 80  (03/01 0600) Resp:  [16-20] 18  (03/01 0800) BP: (120-125)/(75-86) 121/81 mmHg (03/01 0600) SpO2:  [97 %-99 %] 98 % (03/01 0800) Last BM Date: 07/23/11  Intake/Output from previous day: 02/28 0701 - 03/01 0700 In: 2441.8 [I.V.:2091.8; NG/GT:350] Out: 1225 [Urine:725; Emesis/NG output:500] Intake/Output this shift:    PE: Abd: soft, minimal BS, ND, tender, incision c/d/i with staples.  NGT with some bilious output.  Lab Results:  No results found for this basename: WBC:2,HGB:2,HCT:2,PLT:2 in the last 72 hours BMET No results found for this basename: NA:2,K:2,CL:2,CO2:2,GLUCOSE:2,BUN:2,CREATININE:2,CALCIUM:2 in the last 72 hours PT/INR No results found for this basename: LABPROT:2,INR:2 in the last 72 hours   Studies/Results: No results found.  Anti-infectives: Anti-infectives     Start     Dose/Rate Route Frequency Ordered Stop   07/18/11 1135   ertapenem (INVANZ) 1 g in sodium chloride 0.9 % 50 mL IVPB        1 g 100 mL/hr over 30 Minutes Intravenous 60 min pre-op 07/18/11 1135 07/18/11 1235           Assessment/Plan  1. SBO, s/p ex lap with entero-enterostomy bypass 2. 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 Post-op ileus  Plan: 1. Will leave NGT to suction for now.  Will give a suppository and a fleets enema today.  The patient has such significant constipation that I think it's going to take getting some of this cleared out before he starts moving more stuff through. 2. He still seems to have somewhat of a post op ileus as well. 3. Cont NGT for  now.   LOS: 7 days    Bralynn Velador E 07/24/2011

## 2011-07-25 LAB — BASIC METABOLIC PANEL
BUN: 5 mg/dL — ABNORMAL LOW (ref 6–23)
CO2: 29 mEq/L (ref 19–32)
Calcium: 9 mg/dL (ref 8.4–10.5)
Creatinine, Ser: 0.75 mg/dL (ref 0.50–1.35)
Glucose, Bld: 116 mg/dL — ABNORMAL HIGH (ref 70–99)

## 2011-07-25 MED ORDER — MORPHINE SULFATE 2 MG/ML IJ SOLN
2.0000 mg | INTRAMUSCULAR | Status: DC | PRN
  Administered 2011-07-25 – 2011-07-26 (×5): 2 mg via INTRAVENOUS
  Filled 2011-07-25 (×5): qty 1

## 2011-07-25 NOTE — Progress Notes (Signed)
Patient ID: [name], male   DOB: 26-Apr-1956, 56 y.o.   MRN: [id number] 7 Days Post-Op  Subjective: nnot very communicative but he denies significant abdominal pain or other complaints. He states he has had a small amount of flatus and a small bowel movement.  Objective: Vital signs in last 24 hours: Temp:  [98 F (36.7 C)-99.1 F (37.3 C)] 98.2 F (36.8 C) (03/02 0600) Pulse Rate:  [78-90] 78  (03/02 0600) Resp:  [16-20] 18  (03/02 0600) BP: (121-128)/(75-86) 121/81 mmHg (03/02 0600) SpO2:  [97 %-99 %] 97 % (03/02 0600) Last BM Date: 07/23/11  Intake/Output from previous day: 03/01 0701 - 03/02 0700 In: 3051.7 [I.V.:2991.7; NG/GT:60] Out: 2800 [Urine:1100; Emesis/NG output:1700] Intake/Output this shift:    General appearance: alert, fatigued and no distress GI: normal findings: soft, non-tender, not distended. Incision/Wound: clean and dry without evidence of infection.  Lab Results:  No results found for this basename: WBC:2,HGB:2,HCT:2,PLT:2 in the last 72 hours BMET  Basename 07/25/11 0400  NA 136  K 3.9  CL 102  CO2 29  GLUCOSE 116*  BUN 5*  CREATININE 0.75  CALCIUM 9.0     Studies/Results: No results found.  Anti-infectives: Anti-infectives     Start     Dose/Rate Route Frequency Ordered Stop   07/18/11 1135   ertapenem (INVANZ) 1 g in sodium chloride 0.9 % 50 mL IVPB        1 g 100 mL/hr over 30 Minutes Intravenous 60 min pre-op 07/18/11 1135 07/18/11 1235          Assessment/Plan: s/p Procedure(s): EXPLORATORY LAPAROTOMY Stable. He has a large NG output but has been drinking some fluids. His abdomen is soft and he reports some flatus and a small bowel movement. I will try clamping his NG tube today and see how he does.   LOS: 8 days    [name] 07/25/2011

## 2011-07-26 MED ORDER — KETOROLAC TROMETHAMINE 30 MG/ML IJ SOLN
30.0000 mg | Freq: Four times a day (QID) | INTRAMUSCULAR | Status: DC | PRN
  Administered 2011-07-26 – 2011-07-27 (×2): 30 mg via INTRAVENOUS
  Filled 2011-07-26 (×2): qty 1

## 2011-07-26 MED ORDER — MORPHINE SULFATE 2 MG/ML IJ SOLN
2.0000 mg | INTRAMUSCULAR | Status: DC | PRN
  Administered 2011-07-26: 4 mg via INTRAVENOUS
  Administered 2011-07-26: 2 mg via INTRAVENOUS
  Filled 2011-07-26: qty 1
  Filled 2011-07-26: qty 2

## 2011-07-26 NOTE — Progress Notes (Signed)
Patient ID: [name], male   DOB: 1955/07/06, 56 y.o.   MRN: [id number] 8 Days Post-Op  Subjective: Did not tolerate NG clamping with nausea and distention. Currently without complaints with NG back on suction. He seems better spirits.  Objective: Vital signs in last 24 hours: Temp:  [97.7 F (36.5 C)-98.5 F (36.9 C)] 97.9 F (36.6 C) (03/03 0530) Pulse Rate:  [74-80] 74  (03/03 0530) Resp:  [18] 18  (03/03 0530) BP: (117-128)/(78-89) 117/78 mmHg (03/03 0530) SpO2:  [92 %-96 %] 96 % (03/03 0530) Last BM Date: 07/23/11  Intake/Output from previous day: 03/02 0701 - 03/03 0700 In: 2620 [P.O.:120; I.V.:2500] Out: 2475 [Urine:1100; Emesis/NG output:1375] Intake/Output this shift:    General appearance: alert, cooperative and no distress GI: normal findings: soft, non-tender, non distended Incision/Wound: clean and dry without signs of infection  Lab Results:  No results found for this basename: WBC:2,HGB:2,HCT:2,PLT:2 in the last 72 hours BMET  Basename 07/25/11 0400  NA 136  K 3.9  CL 102  CO2 29  GLUCOSE 116*  BUN 5*  CREATININE 0.75  CALCIUM 9.0     Studies/Results: No results found.  Anti-infectives: Anti-infectives     Start     Dose/Rate Route Frequency Ordered Stop   07/18/11 1135   ertapenem (INVANZ) 1 g in sodium chloride 0.9 % 50 mL IVPB        1 g 100 mL/hr over 30 Minutes Intravenous 60 min pre-op 07/18/11 1135 07/18/11 1235          Assessment/Plan: s/p Procedure(s): EXPLORATORY LAPAROTOMY, lysis of adhesions and enteroenterostomy Did not tolerate NG clamping. Likely continued ileus. Will need nutritional support. PICC line and T&A ordered.    LOS: 9 days    Amador Braddy T 07/26/2011

## 2011-07-26 NOTE — Progress Notes (Signed)
Dr. Abbey Chatters aware via phon pt c/o severe left upper quadrant pain described as 'cramping pressure", stated "makes it hard to take deep breath". MD aware recently received morphine 2mg . VSS with sats 96% on RA. See new order received and entered into EPIC to increase frequency of prn morphine. Pt reassured.

## 2011-07-26 NOTE — Progress Notes (Signed)
Pt recently assisted to chair and medicated. Pain subsiding. No acute distress noted.

## 2011-07-26 NOTE — Progress Notes (Signed)
Pt became nauseas and distended in his abdomen and vomited. I hooked pt's  NG tube back up to wall suction and 750cc of bile was removed immediately. Pt stated he felt better afterwards. Will continue to monitor pt. Patsey Berthold

## 2011-07-27 ENCOUNTER — Encounter (HOSPITAL_COMMUNITY): Payer: Self-pay | Admitting: General Surgery

## 2011-07-27 DIAGNOSIS — K56609 Unspecified intestinal obstruction, unspecified as to partial versus complete obstruction: Secondary | ICD-10-CM | POA: Diagnosis present

## 2011-07-27 DIAGNOSIS — K9189 Other postprocedural complications and disorders of digestive system: Secondary | ICD-10-CM | POA: Diagnosis not present

## 2011-07-27 LAB — CBC
MCH: 28.8 pg (ref 26.0–34.0)
Platelets: 380 10*3/uL (ref 150–400)
RBC: 4.72 MIL/uL (ref 4.22–5.81)
RDW: 13.9 % (ref 11.5–15.5)
WBC: 26.7 10*3/uL — ABNORMAL HIGH (ref 4.0–10.5)

## 2011-07-27 LAB — DIFFERENTIAL
Basophils Relative: 0 % (ref 0–1)
Eosinophils Relative: 0 % (ref 0–5)
Lymphs Abs: 0.5 10*3/uL — ABNORMAL LOW (ref 0.7–4.0)
Monocytes Absolute: 0.8 10*3/uL (ref 0.1–1.0)

## 2011-07-27 LAB — COMPREHENSIVE METABOLIC PANEL
BUN: 6 mg/dL (ref 6–23)
Calcium: 8.7 mg/dL (ref 8.4–10.5)
GFR calc Af Amer: >90 mL/min (ref >90)
GFR calc non Af Amer: >90 mL/min (ref >90)
Glucose, Bld: 122 mg/dL — ABNORMAL HIGH (ref 70–99)
Total Protein: 5.7 g/dL — ABNORMAL LOW (ref 6.0–8.3)

## 2011-07-27 LAB — MAGNESIUM: Magnesium: 1.5 mg/dL (ref 1.5–2.5)

## 2011-07-27 MED ORDER — TRACE MINERALS CR-CU-MN-SE-ZN 10-1000-500-60 MCG/ML IV SOLN
INTRAVENOUS | Status: AC
  Administered 2011-07-27: 18:00:00 via INTRAVENOUS
  Filled 2011-07-27: qty 2000

## 2011-07-27 MED ORDER — SODIUM CHLORIDE 0.9 % IJ SOLN
10.0000 mL | INTRAMUSCULAR | Status: DC | PRN
  Administered 2011-07-30 – 2011-08-20 (×13): 10 mL

## 2011-07-27 MED ORDER — HYDROMORPHONE HCL PF 1 MG/ML IJ SOLN
0.5000 mg | INTRAMUSCULAR | Status: DC | PRN
  Administered 2011-07-28: 1 mg via INTRAVENOUS
  Filled 2011-07-27: qty 1

## 2011-07-27 MED ORDER — FAT EMULSION 20 % IV EMUL
250.0000 mL | INTRAVENOUS | Status: AC
  Administered 2011-07-27: 250 mL via INTRAVENOUS
  Filled 2011-07-27: qty 250

## 2011-07-27 MED ORDER — KETOROLAC TROMETHAMINE 30 MG/ML IJ SOLN
30.0000 mg | Freq: Four times a day (QID) | INTRAMUSCULAR | Status: AC | PRN
  Administered 2011-07-27: 30 mg via INTRAVENOUS
  Filled 2011-07-27: qty 1

## 2011-07-27 MED ORDER — INSULIN ASPART 100 UNIT/ML ~~LOC~~ SOLN
0.0000 [IU] | Freq: Three times a day (TID) | SUBCUTANEOUS | Status: DC
  Administered 2011-07-27 – 2011-07-28 (×2): 1 [IU] via SUBCUTANEOUS
  Administered 2011-07-29: 2 [IU] via SUBCUTANEOUS
  Administered 2011-07-29 – 2011-08-08 (×13): 1 [IU] via SUBCUTANEOUS
  Filled 2011-07-27: qty 3

## 2011-07-27 MED ORDER — KCL IN DEXTROSE-NACL 20-5-0.9 MEQ/L-%-% IV SOLN
INTRAVENOUS | Status: DC
  Administered 2011-07-28: 04:00:00 via INTRAVENOUS
  Filled 2011-07-27 (×4): qty 1000

## 2011-07-27 MED ORDER — HYDROMORPHONE HCL 1 MG/ML PO LIQD: 0.5000 mg | ORAL | Status: DC | PRN

## 2011-07-27 NOTE — Progress Notes (Signed)
INITIAL ADULT NUTRITION ASSESSMENT Date: 07/27/2011   Time: 8:56 AM Reason for Assessment: Consult, new TNA  ASSESSMENT: Male 56 y.o.  Dx: SBO (small bowel obstruction)  Hx:  Past Medical History  Diagnosis Date  . H/O: GI bleed   . Chronic neck pain   . Idiopathic scoliosis   . Colonic inertia     with chronic lifelong costipation  . Blood transfusion    Related Meds:  Scheduled Meds:   . heparin  5,000 Units Subcutaneous Q8H  . insulin aspart  0-9 Units Subcutaneous Q8H   Continuous Infusions:   . dextrose 5 % and 0.9 % NaCl with KCl 20 mEq/L 125 mL/hr at 07/27/11 0931  . dextrose 5 % and 0.9 % NaCl with KCl 20 mEq/L    . fat emulsion    . TPN (CLINIMIX) +/- additives     PRN Meds:.acetaminophen, HYDROmorphone HCl, ketorolac, menthol-cetylpyridinium, ondansetron, phenol, DISCONTD: ketorolac, DISCONTD:  morphine injection, DISCONTD:  morphine injection  Ht: 5\' 7"  (170.2 cm)  Wt: 114 lb (51.71 kg)  Ideal Wt: 148 lb % Ideal Wt: 77  Usual Wt: 130 lb % Usual Wt: 87  Body mass index is 17.85 kg/(m^2).  Food/Nutrition Related Hx: Pt admitted with nausea and vomiting without BM for 4 days with report of eating a "bad hamburger" on the Sunday PTA. Pt states that his appetite was poor a week before this episode and states that now he knows it was the bowel obstruction, not the hamburger that was causing these symptoms. Pt reports 15 pound unintentional weight loss in the past year. 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 Pt had NGT placed 2/22. Pt found to have SBO and had exploratory laparotomy with small bowel bypass on 2/23. Found to have post-op ileus. Pt Pt has been unable to tolerate NGT clamping, plan to start TNA today after PICC placed. Pt states his nausea is much better today.   Labs:  CMP     Component Value Date/Time   NA 132* 07/27/2011 0350   K 3.8 07/27/2011 0350   CL 97 07/27/2011 0350   CO2 29 07/27/2011 0350   GLUCOSE  122* 07/27/2011 0350   BUN 6 07/27/2011 0350   CREATININE 0.84 07/27/2011 0350   CALCIUM 8.7 07/27/2011 0350   PROT 5.7* 07/27/2011 0350   ALBUMIN 2.6* 07/27/2011 0350   AST 27 07/27/2011 0350   ALT 74* 07/27/2011 0350   ALKPHOS 81 07/27/2011 0350   BILITOT 0.6 07/27/2011 0350   GFRNONAA >90 07/27/2011 0350   GFRAA >90 07/27/2011 0350    Intake/Output Summary (Last 24 hours) at 07/27/11 0946 Last data filed at 07/27/11 0700  Gross per 24 hour  Intake   3000 ml  Output   2175 ml  Net    825 ml   NGT output - total for previous shift, dark green  Last BM - 07/23/11 small   Diet Order: NPO   IVF:    dextrose 5 % and 0.9 % NaCl with KCl 20 mEq/L Last Rate: 125 mL/hr at 07/27/11 0133  dextrose 5 % and 0.9 % NaCl with KCl 20 mEq/L   fat emulsion   TPN (CLINIMIX) +/- additives     Estimated Nutritional Needs:   Kcal: 1800-2100 Protein: 90-105g Fluid: 1.8-2.1L  NUTRITION DIAGNOSIS: -Inadequate oral intake (NI-2.1).  Status: Ongoing -Pt meets criteria for severe PCM of acute on chronic illness AEB pt with cachetic extremities with severe loss of body  fat and muscle mass in addition to pt with 11.5% weight loss in the past year and <75% energy intake for the past 2 weeks -Pt underweight AEB BMI of 17.8   RELATED TO: post-op ileus  AS EVIDENCE BY: MD notes  MONITORING/EVALUATION(Goals): TNA to meet >90% of estimated nutritional needs.   EDUCATION NEEDS: -No education needs identified at this time  INTERVENTION: TNA per pharmacy. Awaiting return of bowel function. Will monitor.   Dietitian #: [phone]  DOCUMENTATION CODES Per approved criteria  -Severe malnutrition in the context of acute on chronic illness -Underweight    Marshall Cork 07/27/2011, 8:56 AM

## 2011-07-27 NOTE — Progress Notes (Signed)
Pt complaining of pain at level 6 out of 10 over mid abdomen where incision is located that has not been relieved by morphine. MD notified and orders were given for Toradol. Pt stated that the Toradol made him feel great and was very appreciative. Will continue to monitor. [name]

## 2011-07-27 NOTE — Progress Notes (Signed)
9 Days Post-Op  Subjective: No flatus, says he had a bad night morphine didn't help pain, and c/o not being able to breath deep due to pain, it looks like he got tordol which helped a great deal  WBC up to 26.7 Tm 99.9,  TNA to be initiated today, PICC ordered and TNA yesterday. No complaints of trouble voiding.  In depends garment.  Objective: Vital signs in last 24 hours: Temp:  [97.1 F (36.2 C)-99.9 F (37.7 C)] 97.7 F (36.5 C) (03/04 0535) Pulse Rate:  [97-112] 112  (03/04 0535) Resp:  [18] 18  (03/04 0535) BP: (98-110)/(67-75) 98/67 mmHg (03/04 0535) SpO2:  [95 %-96 %] 95 % (03/04 0535) Last BM Date: 07/23/11  Intake/Output from previous day: 03/03 0701 - 03/04 0700 In: 3000 [I.V.:3000] Out: 2525 [Urine:2025; Emesis/NG output:500] Intake/Output this shift:   PE:  Up in bed, Chest; Clear, Abd:  Incision looks good, abd not distended, rare BS.    Lab Results:   [facility] 07/27/11 0350  WBC 26.7*  HGB 13.6  HCT 38.8*  PLT 380    Lab 07/27/11 0350  AST 27  ALT 74*  ALKPHOS 81  BILITOT 0.6  PROT 5.7*  ALBUMIN 2.6*    BMET  Basename 07/27/11 0350 07/25/11 0400  NA 132* 136  K 3.8 3.9  CL 97 102  CO2 29 29  GLUCOSE 122* 116*  BUN 6 5*  CREATININE 0.84 0.75  CALCIUM 8.7 9.0   PT/INR No results found for this basename: LABPROT:2,INR:2 in the last 72 hours   Studies/Results: No results found.  Anti-infectives: Anti-infectives     Start     Dose/Rate Route Frequency Ordered Stop   07/18/11 1135   ertapenem (INVANZ) 1 g in sodium chloride 0.9 % 50 mL IVPB        1 g 100 mL/hr over 30 Minutes Intravenous 60 min pre-op 07/18/11 1135 07/18/11 1235         Current Facility-Administered Medications  Medication Dose Route Frequency Provider Last Rate Last Dose  . acetaminophen (TYLENOL) tablet 650 mg  650 mg Oral Q4H PRN Letha Cape, PA      . dextrose 5 % and 0.9 % NaCl with KCl 20 mEq/L infusion   Intravenous Continuous Mariella Saa, MD 125  mL/hr at 07/27/11 0133    . dextrose 5 % and 0.9 % NaCl with KCl 20 mEq/L infusion   Intravenous Continuous Mariella Saa, MD      . fat emulsion 20 % infusion 250 mL  250 mL Intravenous Continuous TPN Mariella Saa, MD      . heparin injection 5,000 Units  5,000 Units Subcutaneous Q8H Mariella Saa, MD   5,000 Units at 07/27/11 0534  . insulin aspart (novoLOG) injection 0-9 Units  0-9 Units Subcutaneous Q8H Mariella Saa, MD      . ketorolac (TORADOL) 30 MG/ML injection 30 mg  30 mg Intravenous Q6H PRN Adolph Pollack, MD   30 mg at 07/27/11 0250  . menthol-cetylpyridinium (CEPACOL) lozenge 3 mg  1 lozenge Oral PRN Kandis Cocking, MD   3 mg at 07/20/11 0254  . morphine 2 MG/ML injection 2-4 mg  2-4 mg Intravenous Q2H PRN Adolph Pollack, MD   4 mg at 07/26/11 1915  . ondansetron (ZOFRAN) tablet 4 mg  4 mg Oral Q6H PRN Mariella Saa, MD   4 mg at 07/25/11 2346  . phenol (CHLORASEPTIC) mouth spray 1 spray  1 spray Mouth/Throat PRN Kandis Cocking, MD   1 spray at 07/23/11 [phone]  . tpn solution (CLINIMIX E 5/15) 2,000 mL with multivitamins adult 10 mL, trace elements Cr-Cu-Mn-Se-Zn 1 mL infusion   Intravenous Continuous TPN Mariella Saa, MD      . DISCONTD: morphine 2 MG/ML injection 2-4 mg  2-4 mg Intravenous Q4H PRN Mariella Saa, MD   2 mg at 07/26/11 1552    Assessment/Plan Small bowel obstruction s/pEXPLORATORY LAPAROTOMY and small bowel bypass, 07/18/11 POD9 Post op ileus PCM/ TNA to start today.  . . ANXIETY  . PSYCHIATRIC DISORDER  . DEPRESSION  . PUD  . GI BLEEDING  . ARTHRITIS  . SCOLIOSIS  . IMPERFORATE ANUS  . FECAL INCONTINENCE  . INGUINAL HERNIA, HX OF  . FATIGUE  . DYSPNEA  . Obstipation  . Nausea  Plan:  Check UA, TNA, change MS to dilaudid, and give him one more day of toradol, recheck cbc in AM. I'm not sure where WBC elevation is from, he's afebrile, all day.  If still up tomorrow will get CT early AM.

## 2011-07-27 NOTE — Progress Notes (Signed)
PARENTERAL NUTRITION CONSULT NOTE - INITIAL  Pharmacy Consult for TNA Indication: Ileus s/p LOA, enteroenterostomy  No Known Allergies  Patient Measurements: Height: 5\' 7"  (170.2 cm) Weight: 114 lb (51.71 kg) IBW/kg (Calculated) : 66.1   Vital Signs: Temp: 97.7 F (36.5 C) (03/04 0535) Temp src: Oral (03/04 0535) BP: 98/67 mmHg (03/04 0535) Pulse Rate: 112  (03/04 0535) Intake/Output from previous day: 03/03 0701 - 03/04 0700 In: 3000 [I.V.:3000] Out: 2525 [Urine:2025; Emesis/NG output:500] Intake/Output from this shift:    Labs:  [facility] 07/27/11 0350  WBC 26.7*  HGB 13.6  HCT 38.8*  PLT 380  APTT --  INR --     Basename 07/27/11 0350 07/25/11 0400  NA 132* 136  K 3.8 3.9  CL 97 102  CO2 29 29  GLUCOSE 122* 116*  BUN 6 5*  CREATININE 0.84 0.75  LABCREA -- --  CREAT24HRUR -- --  CALCIUM 8.7 9.0  MG 1.5 --  PHOS 3.2 --  PROT 5.7* --  ALBUMIN 2.6* --  AST 27 --  ALT 74* --  ALKPHOS 81 --  BILITOT 0.6 --  BILIDIR -- --  IBILI -- --  PREALBUMIN -- --  TRIG -- --  CHOLHDL -- --  CHOL -- --   Estimated Creatinine Clearance: 73.5 ml/min (by C-G formula based on Cr of 0.84).   No results found for this basename: GLUCAP:3 in the last 72 hours  Medical History: Past Medical History  Diagnosis Date  . H/O: GI bleed   . Chronic neck pain   . Idiopathic scoliosis   . Colonic inertia     with chronic lifelong costipation  . Blood transfusion     Medications:  Scheduled:    . heparin  5,000 Units Subcutaneous Q8H   Infusions:    . dextrose 5 % and 0.9 % NaCl with KCl 20 mEq/L 125 mL/hr at 07/27/11 0133    Current Nutrition:  NPO  Assessment:  56 yo male w/hx imperforate anus, surgery at 1day old to correct and chronic constipation.   Admit 2/22: 5 days no bm, partial SBO. 2/23 Laparotomy for LOA, entero-enterostomy.   NPO since admit, could not tolerate NG clamping, begin TNA after PICC line placed, plan 3/4.   Nutritional Goals:    RD assessment pending For now: 90g/day protein  1758 Kcal/day MWF, 1278 Kcal/day STTHS Avg. 1484 Kcal/day   Plan:  At 1800 today:  Start Clinimix E5/15 at 21ml/hr + fat emulsion at 64ml/hr(fat emulsion only on MWF due to ongoing shortage).  Plan to advance as tolerated to a goal rate of 83ml/hr + 4ml/hr to provide: 90g/day protein and 1758Kcal/day MWF, 1278Kcal/day STTHS(Avg. 1484Kcal/day).  TNA to contain standard multivitamins and trace elements (Only on MWF due to ongoing shortage).  Reduce IVF to 93ml/hr.  Add/Change SSI  q8h   TNA lab panels on Mondays & Thursdays.  Loralee Pacas, PharmD, BCPS Pager: [phone] 07/27/2011,7:13 AM

## 2011-07-28 ENCOUNTER — Encounter (HOSPITAL_COMMUNITY): Payer: Self-pay | Admitting: Registered Nurse

## 2011-07-28 ENCOUNTER — Encounter (HOSPITAL_COMMUNITY): Admission: EM | Disposition: A | Discharge status: Home Health | Payer: Self-pay | Source: Home / Self Care

## 2011-07-28 ENCOUNTER — Inpatient Hospital Stay (HOSPITAL_COMMUNITY): Payer: Medicaid Other

## 2011-07-28 ENCOUNTER — Inpatient Hospital Stay (HOSPITAL_COMMUNITY): Payer: Medicaid Other | Admitting: Registered Nurse

## 2011-07-28 DIAGNOSIS — Z9889 Other specified postprocedural states: Secondary | ICD-10-CM

## 2011-07-28 HISTORY — PX: LAPAROTOMY: SHX154

## 2011-07-28 LAB — GLUCOSE, CAPILLARY
Glucose-Capillary: 160 mg/dL — ABNORMAL HIGH (ref 70–99)
Glucose-Capillary: 132 mg/dL — ABNORMAL HIGH (ref 70–99)
Glucose-Capillary: 134 mg/dL — ABNORMAL HIGH (ref 70–99)
Glucose-Capillary: 124 mg/dL — ABNORMAL HIGH (ref 70–99)

## 2011-07-28 LAB — CBC
HCT: 34.1 % — ABNORMAL LOW (ref 39.0–52.0)
Hemoglobin: 11.5 g/dL — ABNORMAL LOW (ref 13.0–17.0)
MCH: 28.1 pg (ref 26.0–34.0)
MCV: 83.4 fL (ref 78.0–100.0)
RBC: 4.09 MIL/uL — ABNORMAL LOW (ref 4.22–5.81)

## 2011-07-28 LAB — URINALYSIS, ROUTINE W REFLEX MICROSCOPIC
Bilirubin Urine: NEGATIVE
Nitrite: NEGATIVE
Protein, ur: 30 mg/dL — AB
Urobilinogen, UA: 1 mg/dL (ref 0.0–1.0)

## 2011-07-28 LAB — URINE MICROSCOPIC-ADD ON

## 2011-07-28 LAB — TYPE AND SCREEN: Antibody Screen: NEGATIVE

## 2011-07-28 LAB — BASIC METABOLIC PANEL
CO2: 29 mEq/L (ref 19–32)
Chloride: 105 mEq/L (ref 96–112)
Glucose, Bld: 119 mg/dL — ABNORMAL HIGH (ref 70–99)
Potassium: 3.8 mEq/L (ref 3.5–5.1)
Sodium: 140 mEq/L (ref 135–145)

## 2011-07-28 LAB — ABO/RH: ABO/RH(D): O POS

## 2011-07-28 SURGERY — LAPAROTOMY, EXPLORATORY
Anesthesia: General | Site: Abdomen | Wound class: Dirty or Infected

## 2011-07-28 MED ORDER — CLINIMIX/DEXTROSE (5/15) 5 % IV SOLN
INTRAVENOUS | Status: DC | PRN
  Administered 2011-07-28: 55 mL/h via INTRAVENOUS

## 2011-07-28 MED ORDER — LACTATED RINGERS IV SOLN: INTRAVENOUS | Status: DC

## 2011-07-28 MED ORDER — SUCCINYLCHOLINE CHLORIDE 20 MG/ML IJ SOLN
INTRAMUSCULAR | Status: DC | PRN
  Administered 2011-07-28: 100 mg via INTRAVENOUS

## 2011-07-28 MED ORDER — LIDOCAINE HCL (CARDIAC) 20 MG/ML IV SOLN
INTRAVENOUS | Status: DC | PRN
  Administered 2011-07-28: 40 mg via INTRAVENOUS

## 2011-07-28 MED ORDER — CLINIMIX/DEXTROSE (5/15) 5 % IV SOLN
INTRAVENOUS | Status: AC
  Administered 2011-07-28: 18:00:00 via INTRAVENOUS
  Filled 2011-07-28: qty 2000

## 2011-07-28 MED ORDER — GUAIFENESIN-DM 100-10 MG/5ML PO SYRP: 15.0000 mL | ORAL_SOLUTION | ORAL | Status: DC | PRN

## 2011-07-28 MED ORDER — SODIUM CHLORIDE 0.9 % IV SOLN
INTRAVENOUS | Status: DC | PRN
  Administered 2011-07-28: 20:00:00 via INTRAVENOUS

## 2011-07-28 MED ORDER — HYDROMORPHONE HCL PF 1 MG/ML IJ SOLN
0.2500 mg | INTRAMUSCULAR | Status: DC | PRN
  Administered 2011-07-28 (×3): 0.5 mg via INTRAVENOUS

## 2011-07-28 MED ORDER — NEOSTIGMINE METHYLSULFATE 1 MG/ML IJ SOLN
INTRAMUSCULAR | Status: DC | PRN
  Administered 2011-07-28: 4 mg via INTRAVENOUS

## 2011-07-28 MED ORDER — ONDANSETRON HCL 4 MG/2ML IJ SOLN
INTRAMUSCULAR | Status: DC | PRN
  Administered 2011-07-28 (×2): 4 mg via INTRAVENOUS

## 2011-07-28 MED ORDER — ESMOLOL HCL 10 MG/ML IV SOLN
INTRAVENOUS | Status: DC | PRN
  Administered 2011-07-28: 10 mg via INTRAVENOUS
  Administered 2011-07-28: 20 mg via INTRAVENOUS

## 2011-07-28 MED ORDER — PIPERACILLIN-TAZOBACTAM 3.375 G IVPB
3.3750 g | Freq: Three times a day (TID) | INTRAVENOUS | Status: DC
  Administered 2011-07-28 – 2011-08-14 (×49): 3.375 g via INTRAVENOUS
  Filled 2011-07-28 (×53): qty 50

## 2011-07-28 MED ORDER — PROPOFOL 10 MG/ML IV EMUL
INTRAVENOUS | Status: DC | PRN
  Administered 2011-07-28: 100 mg via INTRAVENOUS

## 2011-07-28 MED ORDER — GLYCOPYRROLATE 0.2 MG/ML IJ SOLN
INTRAMUSCULAR | Status: DC | PRN
  Administered 2011-07-28: 0.4 mg via INTRAVENOUS

## 2011-07-28 MED ORDER — MIDAZOLAM HCL 5 MG/5ML IJ SOLN
INTRAMUSCULAR | Status: DC | PRN
  Administered 2011-07-28: 1 mg via INTRAVENOUS

## 2011-07-28 MED ORDER — LACTATED RINGERS IV SOLN
INTRAVENOUS | Status: DC | PRN
  Administered 2011-07-28: 21:00:00 via INTRAVENOUS

## 2011-07-28 MED ORDER — KCL IN DEXTROSE-NACL 20-5-0.9 MEQ/L-%-% IV SOLN
INTRAVENOUS | Status: AC
  Filled 2011-07-28 (×3): qty 1000

## 2011-07-28 MED ORDER — PROMETHAZINE HCL 25 MG/ML IJ SOLN
12.5000 mg | Freq: Four times a day (QID) | INTRAMUSCULAR | Status: DC | PRN
  Administered 2011-07-28: 12.5 mg via INTRAVENOUS

## 2011-07-28 MED ORDER — LIP MEDEX EX OINT
1.0000 "application " | TOPICAL_OINTMENT | Freq: Two times a day (BID) | CUTANEOUS | Status: DC
  Administered 2011-07-28 – 2011-08-19 (×35): 1 via TOPICAL
  Filled 2011-07-28: qty 7

## 2011-07-28 MED ORDER — BISACODYL 10 MG RE SUPP: 10.0000 mg | Freq: Two times a day (BID) | RECTAL | Status: DC | PRN

## 2011-07-28 MED ORDER — DIPHENHYDRAMINE HCL 50 MG/ML IJ SOLN: 12.5000 mg | Freq: Four times a day (QID) | INTRAMUSCULAR | Status: DC | PRN

## 2011-07-28 MED ORDER — FENTANYL CITRATE 0.05 MG/ML IJ SOLN
INTRAMUSCULAR | Status: DC | PRN
  Administered 2011-07-28 (×9): 50 ug via INTRAVENOUS

## 2011-07-28 MED ORDER — MAGIC MOUTHWASH
15.0000 mL | Freq: Four times a day (QID) | ORAL | Status: DC | PRN
  Filled 2011-07-28: qty 15

## 2011-07-28 MED ORDER — IOHEXOL 300 MG/ML  SOLN
80.0000 mL | Freq: Once | INTRAMUSCULAR | Status: AC | PRN
  Administered 2011-07-28: 80 mL via INTRAVENOUS

## 2011-07-28 MED ORDER — ONDANSETRON HCL 4 MG/2ML IJ SOLN
4.0000 mg | Freq: Four times a day (QID) | INTRAMUSCULAR | Status: DC | PRN
  Administered 2011-07-28 – 2011-08-04 (×3): 4 mg via INTRAVENOUS
  Filled 2011-07-28 (×5): qty 2

## 2011-07-28 MED ORDER — LABETALOL HCL 5 MG/ML IV SOLN
INTRAVENOUS | Status: DC | PRN
  Administered 2011-07-28 (×2): 2.5 mg via INTRAVENOUS

## 2011-07-28 MED ORDER — ACETAMINOPHEN 650 MG RE SUPP
650.0000 mg | Freq: Four times a day (QID) | RECTAL | Status: DC | PRN
Start: 2011-07-28 — End: 2011-08-20

## 2011-07-28 MED ORDER — ONDANSETRON 8 MG/NS 50 ML IVPB
8.0000 mg | Freq: Four times a day (QID) | INTRAVENOUS | Status: DC | PRN
  Filled 2011-07-28: qty 8

## 2011-07-28 MED ORDER — ROCURONIUM BROMIDE 100 MG/10ML IV SOLN
INTRAVENOUS | Status: DC | PRN
  Administered 2011-07-28: 10 mg via INTRAVENOUS
  Administered 2011-07-28: 20 mg via INTRAVENOUS
  Administered 2011-07-28: 10 mg via INTRAVENOUS

## 2011-07-28 SURGICAL SUPPLY — 34 items
APPLICATOR COTTON TIP 6IN STRL (MISCELLANEOUS) ×2 IMPLANT
BLADE EXTENDED COATED 6.5IN (ELECTRODE) ×2 IMPLANT
BLADE HEX COATED 2.75 (ELECTRODE) ×2 IMPLANT
CANISTER SUCTION 2500CC (MISCELLANEOUS) ×2 IMPLANT
CLOTH BEACON ORANGE TIMEOUT ST (SAFETY) ×2 IMPLANT
COVER MAYO STAND STRL (DRAPES) ×2 IMPLANT
DRAPE LAPAROSCOPIC ABDOMINAL (DRAPES) ×2 IMPLANT
DRAPE WARM FLUID 44X44 (DRAPE) ×2 IMPLANT
DRSG PAD ABDOMINAL 8X10 ST (GAUZE/BANDAGES/DRESSINGS) ×2 IMPLANT
ELECT REM PT RETURN 9FT ADLT (ELECTROSURGICAL) ×2
ELECTRODE REM PT RTRN 9FT ADLT (ELECTROSURGICAL) ×1 IMPLANT
GAUZE SPONGE 4X4 12PLY STRL LF (GAUZE/BANDAGES/DRESSINGS) ×2 IMPLANT
GLOVE BIOGEL PI IND STRL 7.0 (GLOVE) IMPLANT
GLOVE BIOGEL PI INDICATOR 7.0 (GLOVE)
GOWN STRL NON-REIN LRG LVL3 (GOWN DISPOSABLE) ×2 IMPLANT
GOWN STRL REIN XL XLG (GOWN DISPOSABLE) ×4 IMPLANT
KIT BASIN OR (CUSTOM PROCEDURE TRAY) ×2 IMPLANT
NS IRRIG 1000ML POUR BTL (IV SOLUTION) ×2 IMPLANT
PACK GENERAL/GYN (CUSTOM PROCEDURE TRAY) ×2 IMPLANT
SPONGE GAUZE 4X4 12PLY (GAUZE/BANDAGES/DRESSINGS) ×2 IMPLANT
SPONGE LAP 18X18 X RAY DECT (DISPOSABLE) IMPLANT
STAPLER VISISTAT 35W (STAPLE) ×2 IMPLANT
SUCTION POOLE TIP (SUCTIONS) IMPLANT
SUT PDS AB 1 CTX 36 (SUTURE) IMPLANT
SUT SILK 2 0 (SUTURE)
SUT SILK 2 0 SH CR/8 (SUTURE) IMPLANT
SUT SILK 2-0 18XBRD TIE 12 (SUTURE) IMPLANT
SUT SILK 3 0 (SUTURE)
SUT SILK 3 0 SH CR/8 (SUTURE) IMPLANT
SUT SILK 3-0 18XBRD TIE 12 (SUTURE) IMPLANT
TAPE CLOTH SURG 4X10 WHT LF (GAUZE/BANDAGES/DRESSINGS) ×2 IMPLANT
TOWEL OR 17X26 10 PK STRL BLUE (TOWEL DISPOSABLE) ×4 IMPLANT
TRAY FOLEY CATH 14FRSI W/METER (CATHETERS) IMPLANT
YANKAUER SUCT BULB TIP NO VENT (SUCTIONS) IMPLANT

## 2011-07-28 NOTE — Transfer of Care (Signed)
Immediate Anesthesia Transfer of Care Note  Patient: [name]  Procedure(s) Performed: Procedure(s) (LRB): EXPLORATORY LAPAROTOMY (N/A) GASTROSTOMY TUBE (N/A)  Patient Location: PACU  Anesthesia Type: General  Level of Consciousness: awake, alert , patient cooperative and responds to stimulation  Airway & Oxygen Therapy: Patient Spontanous Breathing and Patient connected to face mask oxygen  Post-op Assessment: Report given to PACU RN, Post -op Vital signs reviewed and stable and Patient moving all extremities X 4  Post vital signs: stable  Complications: No apparent anesthesia complications

## 2011-07-28 NOTE — Anesthesia Preprocedure Evaluation (Addendum)
Anesthesia Evaluation  Patient identified by MRN, date of birth, ID band Patient awake    Reviewed: Allergy & Precautions, H&P , NPO status , Patient's Chart, lab work & pertinent test results  Airway Mallampati: II TM Distance: >3 FB Neck ROM: full    Dental No notable dental hx. (+) Teeth Intact and Dental Advisory Given   Pulmonary neg pulmonary ROS, shortness of breath,  breath sounds clear to auscultation  Pulmonary exam normal       Cardiovascular Exercise Tolerance: Good negative cardio ROS  Rhythm:regular Rate:Normal     Neuro/Psych PSYCHIATRIC DISORDERS Cervical fusion - remote negative neurological ROS  negative psych ROS   GI/Hepatic negative GI ROS, Neg liver ROS, PUD,   Endo/Other  negative endocrine ROS  Renal/GU negative Renal ROS  negative genitourinary   Musculoskeletal   Abdominal   Peds  Hematology negative hematology ROS (+)   Anesthesia Other Findings   Reproductive/Obstetrics negative OB ROS                         Anesthesia Physical Anesthesia Plan  ASA: II  Anesthesia Plan: General   Post-op Pain Management:    Induction: Intravenous, Rapid sequence and Cricoid pressure planned  Airway Management Planned: Oral ETT  Additional Equipment:   Intra-op Plan:   Post-operative Plan: Extubation in OR  Informed Consent: I have reviewed the patients History and Physical, chart, labs and discussed the procedure including the risks, benefits and alternatives for the proposed anesthesia with the patient or authorized representative who has indicated his/her understanding and acceptance.   Dental Advisory Given  Plan Discussed with: CRNA and Surgeon  Anesthesia Plan Comments:         Anesthesia Quick Evaluation

## 2011-07-28 NOTE — Anesthesia Postprocedure Evaluation (Signed)
  Anesthesia Post-op Note  Patient: [name]  Procedure(s) Performed: Procedure(s) (LRB): EXPLORATORY LAPAROTOMY (N/A) GASTROSTOMY TUBE (N/A) INCISION AND DRAINAGE ABSCESS (N/A)  Patient Location: PACU  Anesthesia Type: General  Level of Consciousness: awake and patient cooperative  Airway and Oxygen Therapy: Patient Spontanous Breathing and Patient connected to nasal cannula oxygen  Post-op Pain: mild  Post-op Assessment: Post-op Vital signs reviewed, Patient's Cardiovascular Status Stable, Respiratory Function Stable and Patent Airway  Post-op Vital Signs: stable  Complications: No apparent anesthesia complications

## 2011-07-28 NOTE — Progress Notes (Signed)
PARENTERAL NUTRITION CONSULT NOTE - FOLLOW UP  Pharmacy Consult for TNA Indication: Ileus s/p lysis of adhesions, enteroenterostomy 2/23  No Known Allergies  Patient Measurements: Height: 5\' 7"  (170.2 cm) Weight: 114 lb (51.71 kg) IBW/kg (Calculated) : 66.1   Vital Signs: Temp: 99 F (37.2 C) (03/05 0612) Temp src: Oral (03/05 0612) BP: 92/60 mmHg (03/05 0612) Pulse Rate: 108  (03/05 0612) Intake/Output from previous day: 03/04 0701 - 03/05 0700 In: -  Out: 350 [Urine:350]  Labs:  [facility] 07/28/11 0425 07/27/11 0350  WBC 22.3* 26.7*  HGB 11.5* 13.6  HCT 34.1* 38.8*  PLT 344 380  APTT -- --  INR -- --     Basename 07/28/11 0425 07/27/11 0350  NA 140 132*  K 3.8 3.8  CL 105 97  CO2 29 29  GLUCOSE 119* 122*  BUN 18 6  CREATININE 0.85 0.84  LABCREA -- --  CREAT24HRUR -- --  CALCIUM 8.7 8.7  MG -- 1.5  PHOS -- 3.2  PROT -- 5.7*  ALBUMIN -- 2.6*  AST -- 27  ALT -- 74*  ALKPHOS -- 81  BILITOT -- 0.6  BILIDIR -- --  IBILI -- --  PREALBUMIN -- 12.0*  TRIG -- 54  CHOLHDL -- --  CHOL -- 117   Estimated Creatinine Clearance: 72.7 ml/min (by C-G formula based on Cr of 0.85).    Basename 07/28/11 0607 07/27/11 2146  GLUCAP 134* 132*    Medications:  Scheduled:    . heparin  5,000 Units Subcutaneous Q8H  . insulin aspart  0-9 Units Subcutaneous Q8H   Infusions:    . dextrose 5 % and 0.9 % NaCl with KCl 20 mEq/L 85 mL/hr at 07/28/11 0341  . fat emulsion 250 mL (07/27/11 1751)  . TPN (CLINIMIX) +/- additives 40 mL/hr at 07/27/11 1751  . DISCONTD: dextrose 5 % and 0.9 % NaCl with KCl 20 mEq/L 125 mL/hr at 07/27/11 0931   PRN: acetaminophen, HYDROmorphone (DILAUDID) injection, ketorolac, menthol-cetylpyridinium, ondansetron, phenol, sodium chloride, DISCONTD: HYDROmorphone HCl, DISCONTD: ketorolac, DISCONTD:  morphine injection  Insulin Requirements in the past 24 hours:  2 units  Current Nutrition:  Clinimix E 5/15 at 29mL/hr Fat Emulsion 20% at  36mL/hr MWF only due to national shortage Multivitamins + Trace Elements MWF only due to national shortage NPO  IVF: D-5-NS + KCl 3mEq/L at 5mL/hr   Nutritional Goals:  Per RD assessment 3/4: 1800-2100 kCal/d, 90-105 grams/d   Assessment:  56 yo male w/hx imperforate anus, repaired at 1 day old; chronic constipation  Admitted with partial SBO, underwent lysis of adhesions and enteroenterostomy 2/23, now with persistent postoperative ileus  TNA started 3/4.  Baseline prealbumin 12.0  Tolerating TPN at 71mL/hr + Lipids 68mL/hr.  Lytes wnl, CBGs acceptable.   Plan:   Advance TPN to 79mL/hr.  Reduce mIVF to 40mL/hr  BMet, Mg, Phos, TG tomorrow.  Elie Goody, PharmD, BCPS Pager: [phone] 07/28/2011  7:22 AM

## 2011-07-28 NOTE — Op Note (Signed)
Preoperative Diagnosis: postop abdominal abscess, rule out anastomotic leak  Postoprative Diagnosis: same  Procedure: exploratory laparotomy with drainage of intra-abdominal abscess and placement of gastrostomy tube   Surgeon: Glenna Fellows T   Assistants: Bluford Main  Anesthesia:  General endotracheal anesthesiaDiagnos  Indications:  This patient is a 56 year old male with a previous history of laparotomy for repair of imperforate anus as an infant. 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 The patient has 48 hours of increasing abdominal pain and elevated white count. CT scan today shows a apparent abscess with small amount of air and complex density in the left upper quadrant anteriorly. No definite leak of contrast was seen but concern was raised do to the air and bearing density and abscess of an enteric leak. He also has persistent evidence of obstruction. With this constellation of findings we discussed alternatives of laparotomy versus percutaneous drainage of his abscess and I felt that due to the possibility of an enteric leak that laparotomy was indicated. The patient was agreeable and risks of bleeding, infection, anesthetic complications were discussed and understood and is brought to the operating room for this procedure.   Procedure Detail: Patient was brought to the operating room and placed in the supine position on the operating table and general endotracheal anesthesia was induced. Foley catheter was placed. He had received broad spectrum IV antibiotics and PAS were in place. Patient timeout was performed the correct procedure verified after widely sterilely prepping and draping the abdomen. The previous midline incision was bluntly opened down to the fascia and  the running PDS suture removed. The fascia was carefully separated and I immediately entered the abscess cavity in the anterior left upper quadrant. This was foul-smelling and initially seemed feculent. Bowel loops were carefully freed from the anterior double wall with blunt dissection. The abscess cavity was widely exposed which was lying anterior to bowel loops in the left upper quadrant and up against the abdominal wall. This was sealed from the upper abdomen and as we freed adhesions the stomach was exposed and was normal and free of adhesions as was the liver and left upper quadrant. The abscess cavity was completely evacuated and thoroughly irrigated. The inflammatory rind was abraded off of bowel loops. After thorough irrigation we then carefully examined the bowel loops anteriorly and we did not see any evidence of enteric leak at this point. The previous enteroenterostomy was somewhat posterior and not exposed. We initially attempted to separate the small bowel loops to perform a thorough exploration but there were extremely dense adhesions at this point between bowel loops and we felt that there was a high likelihood of causing injury if we continued to dissect between the loops of bowel. We carefully irrigated and observed for quite some time for any evidence of leak from any of the bowel loops and could see none. Due the patient's prolonged need for NG tube I elected to place a gastrostomy tube. A mobile portion of the stomach distally was chosen and concentric 2-0 chromic pursestring sutures were placed. A 24 French Foley catheter was brought through a stab wound in the left upper quadrant and a gastrotomy made in the center of the pursestring sutures and the Foley introduced into the stomach and the balloon inflated. The sutures were secured. The stomach was then brought up to  the anterior abdominal wall with no tension and was secured with 4 2-0 silk sutures. We had packed the abscess cavity with  sponges and removed these and again saw no evidence of any enteric leak. A 19 Blake drain was brought out through a stab wound in the lower left abdomen and left in the abscess cavity in the left mid and upper quadrant. The abdomen was again irrigated and hemostasis assured. The midline fascia was then closed with running looped #1 PDS begun at either end of the incision and tied centrally. Subcutaneous tissue was left open and packed with moist saline gauze. Sponge needle and instrument counts were correct.   Findings: As above  Estimated Blood Loss:  less than 100 mL         Drains: 19 Blake drain in left upper quadrant  Blood Given: none          Specimens: culture and sensitivity of abscess        Complications:  * No complications entered in OR log *         Disposition: PACU - hemodynamically stable.         Condition: stable  Mariella Saa MD, FACS  07/28/2011, 10:39 PM

## 2011-07-28 NOTE — Progress Notes (Signed)
-  mobilize -f/u studies -CT scan if he  does not improve

## 2011-07-28 NOTE — Progress Notes (Signed)
10 Days Post-Op   Subjective: Hiccups kept him up most of the night but pain was better.  ?BMx2, Very little flatus.  Objective: Vital signs in last 24 hours: Temp:  [97.8 F (36.6 C)-99 F (37.2 C)] 99 F (37.2 C) (03/05 0612) Pulse Rate:  [102-108] 108  (03/05 0612) Resp:  [18-20] 18  (03/05 0612) BP: (91-97)/(57-64) 92/60 mmHg (03/05 0612) SpO2:  [94 %-98 %] 95 % (03/05 0612) Last BM Date: 07/23/11  Intake/Output from previous day: 03/04 0701 - 03/05 0700 In: 1762.5 [I.V.:1105; TPN:657.5] Out: 350 [Urine:350]  No recorded NGT OP from last 24h though I'm sure that's incorrect.  General appearance: alert and no distress Resp: clear to auscultation bilaterally Cardio: Mild tachycardia GI: Incision C/D/I. Soft, severe TTP LUQ, RLQ, diminished BS. Extremities: No TTP, no edema.  Lab Results:   Basename 07/28/11 0425 07/27/11 0350  WBC 22.3* 26.7*  HGB 11.5* 13.6  HCT 34.1* 38.8*  PLT 344 380   BMET  Basename 07/28/11 0425 07/27/11 0350  NA 140 132*  K 3.8 3.8  CL 105 97  CO2 29 29  GLUCOSE 119* 122*  BUN 18 6  CREATININE 0.85 0.84  CALCIUM 8.7 8.7    Studies/Results: Dg Abd Acute W/chest  07/28/2011  *RADIOLOGY REPORT*  Clinical Data: Small bowel obstruction.  ACUTE ABDOMEN SERIES (ABDOMEN 2 VIEW & CHEST 1 VIEW)  Comparison: 07/18/2011  Findings: NG tube is in the stomach.  Right PICC line is in place with the tip at the cavoatrial junction.  Bibasilar atelectasis with small bilateral effusions.  Skin staples are noted in the midline of the lower abdomen and upper pelvis.  There is a nonspecific bowel gas pattern.  Air-fluid levels are seen within scattered left abdominal small bowel loops. Gas and stool noted within the colon.  No free air.  No organomegaly.  IMPRESSION: Nonspecific bowel gas pattern with scattered air-fluid levels and left abdominal small bowel loops.  The previously seen small bowel dilatation not appreciated on today's study.  Gas and stool  within colon.  NG tube in the stomach.  Bibasilar atelectasis, small bilateral effusions.  Original Report Authenticated By: Cyndie Chime, M.D.     Assessment/Plan: s/p Procedure(s) (LRB): EXPLORATORY LAPAROTOMY (N/A)  WBC slightly improved, still afebrile. Given persistent elevation, continued abdominal tenderness, and now hiccups will get CT scan looking for abscess. NGT to remain. Will also get dopplers of BLE and RUE (PICC) to r/o DVT as cause of leukocytosis.   LOS: 11 days    JEFFERY,MICHAEL J. 07/28/2011  Pt distended with some abd pain LUQ > RLQ With prolonged post-op ileus and elevated WBC, I am concerned about an abscess or recurrent SBo CT scan ordered to help r/o further intraabd pathology. Continue TNA for malnutrition/ileus

## 2011-07-28 NOTE — Progress Notes (Signed)
*  PRELIMINARY RESULTS* Vascular Ultrasound Right upper extremity venous duplex and Bilateral lower extremity venous duplex have been completed.  Preliminary findings: Right upper extremity= No evidence of deep or superficial thrombus. Bilateral lower extremity= No evidence of DVT or baker's cyst.  Farrel Demark RDMS 07/28/2011, 1:52 PM

## 2011-07-28 NOTE — Progress Notes (Signed)
Patient ID: [name], male   DOB: Aug 13, 1955, 56 y.o.   MRN: [id number] 10 Days Post-Op  Subjective: Patient is seen following completion of the CT scan. He states he is feeling about the same with some pain in his left upper quadrant but that he has been resting okay.  Objective: Vital signs in last 24 hours: Temp:  [97.8 F (36.6 C)-99 F (37.2 C)] 98.6 F (37 C) (03/05 1400) Pulse Rate:  [101-108] 101  (03/05 1400) Resp:  [18] 18  (03/05 1400) BP: (91-96)/(60-68) 96/68 mmHg (03/05 1400) SpO2:  [94 %-97 %] 97 % (03/05 1400) Last BM Date: 07/23/11  Intake/Output from previous day: 03/04 0701 - 03/05 0700 In: 1762.5 [I.V.:1105; TPN:657.5] Out: 350 [Urine:350] Intake/Output this shift:    General appearance: alert and no distress abdomen: There is moderate diffuse tenderness but more marked tenderness in the left midabdomen and left upper quadrant with some guarding. This is distinctly worse than his exam 48 hours ago.  Lab Results:   Basename 07/28/11 0425 07/27/11 0350  WBC 22.3* 26.7*  HGB 11.5* 13.6  HCT 34.1* 38.8*  PLT 344 380   BMET  Basename 07/28/11 0425 07/27/11 0350  NA 140 132*  K 3.8 3.8  CL 105 97  CO2 29 29  GLUCOSE 119* 122*  BUN 18 6  CREATININE 0.85 0.84  CALCIUM 8.7 8.7     Studies/Results: Ct Abdomen Pelvis W Contrast  07/28/2011  *RADIOLOGY REPORT*  Clinical Data: Leukocytosis.  Prior small bowel obstruction surgery on 07/20/2011.  Difficulty advancing diet.  CT ABDOMEN AND PELVIS WITH CONTRAST  Technique:  Multidetector CT imaging of the abdomen and pelvis was performed following the standard protocol during bolus administration of intravenous contrast.  Contrast: 80mL OMNIPAQUE IOHEXOL 300 MG/ML IJ SOLN  Comparison: 07/17/2011; 07/28/2011  Findings: A small left pleural effusion noted with passive atelectasis.  Nasogastric tube enters the stomach.  The liver, spleen, pancreas, and adrenal glands appear unremarkable.  Non-rotated left kidney  noted, stable.  There is free peritoneal gas along with anterior intraperitoneal fluid.  Abnormal complex fluid is present along the omental margin, and I cannot exclude leak of oral contrast into the peritoneal cavity given this high density fluid.  There is suspicion for wall thickening in the transverse colon, possibly secondary to the adjacent fluid.  Multiple dilated loops of small bowel are identified, measuring up to 5 cm in diameter, with nondilated distal small bowel loops favoring obstruction.  Staple line from side-to-side bypass noted.  My impression is that the bypass side probably represents the transition between dilated in nondilated bowel.  Bowel wall thickening in this vicinity is noted.  Urinary bladder appears unremarkable.  Mild prominence of stool noted in the proximal colon.  Lumbar scoliosis noted.  IMPRESSION:  1.  Residual free intraperitoneal gas 8 days out postoperative, with abnormal complex fluid along the anterior peritoneal margin eccentric to the left, containing high density material which could represent blood products or leaked oral contrast medium.  Extensive infiltration of the omentum noted with adjacent transverse colon wall thickening. 2.  Abnormal dilated small bowel is thought to likely extend to the anastomotic site, at which there is a transitioning caliber.  I cannot exclude a leak at the anastomotic site given the high mixed density fluid along the anterior peritoneal cavity. 3.  Scoliosis. 4.  Small left pleural effusion with passive atelectasis. 5.  Abnormal thickened transverse colon, possibly secondarily inflamed.  I discussed these findings with Dr.  Ben Kortne All by telephone at 6:05 p.m. on 07/28/2011.  Original Report Authenticated By: Dellia Cloud, M.D.   Dg Abd Acute W/chest  07/28/2011  *RADIOLOGY REPORT*  Clinical Data: Small bowel obstruction.  ACUTE ABDOMEN SERIES (ABDOMEN 2 VIEW & CHEST 1 VIEW)  Comparison: 07/18/2011  Findings: NG tube is in the  stomach.  Right PICC line is in place with the tip at the cavoatrial junction.  Bibasilar atelectasis with small bilateral effusions.  Skin staples are noted in the midline of the lower abdomen and upper pelvis.  There is a nonspecific bowel gas pattern.  Air-fluid levels are seen within scattered left abdominal small bowel loops. Gas and stool noted within the colon.  No free air.  No organomegaly.  IMPRESSION: Nonspecific bowel gas pattern with scattered air-fluid levels and left abdominal small bowel loops.  The previously seen small bowel dilatation not appreciated on today's study.  Gas and stool within colon.  NG tube in the stomach.  Bibasilar atelectasis, small bilateral effusions.  Original Report Authenticated By: Cyndie Chime, M.D.    Anti-infectives: Anti-infectives     Start     Dose/Rate Route Frequency Ordered Stop   07/18/11 1135   ertapenem (INVANZ) 1 g in sodium chloride 0.9 % 50 mL IVPB        1 g 100 mL/hr over 30 Minutes Intravenous 60 min pre-op 07/18/11 1135 07/18/11 1235          Assessment/Plan: s/p Procedure(s): EXPLORATORY LAPAROTOMY and small bowel bypass.  The patient now has developed increased abdominal pain and elevated white count. I've reviewed the CT scan in detail with the radiologist. There is an obvious abscess in the left upper quadrant with some small air bubbles and mixed density fluid. The mixed density is consistent with hematoma but contrast leakage cannot be completely ruled out. There appears to be ongoing obstruction at the area of his bypass. I discussed all these findings in detail with the patient and with his sister. I presented options of reexploration with drainage of abscess and careful inspection of his bowel and the anastomosis versus percutaneous drainage. Due to the concern raised over a contrast leak and with ongoing obstruction I favor operative reexploration and this is what he is agreeable to after our discussion. I discussed possible  findings and risks of reoperation including bleeding and infection. Patient will be started on broad-spectrum IV antibiotics and taken to the operating room this evening.    LOS: 11 days    Kailani Brass T 07/28/2011

## 2011-07-28 NOTE — Anesthesia Postprocedure Evaluation (Signed)
  Anesthesia Post-op Note  Patient: [name]  Procedure(s) Performed: Procedure(s) (LRB): EXPLORATORY LAPAROTOMY (N/A) GASTROSTOMY TUBE (N/A) INCISION AND DRAINAGE ABSCESS (N/A)  Patient Location: PACU  Anesthesia Type: General  Level of Consciousness: awake and alert   Airway and Oxygen Therapy: Patient Spontanous Breathing  Post-op Pain: mild  Post-op Assessment: Post-op Vital signs reviewed, Patient's Cardiovascular Status Stable, Respiratory Function Stable, Patent Airway and No signs of Nausea or vomiting  Post-op Vital Signs: stable  Complications: No apparent anesthesia complications

## 2011-07-29 LAB — BASIC METABOLIC PANEL
BUN: 17 mg/dL (ref 6–23)
Calcium: 8.3 mg/dL — ABNORMAL LOW (ref 8.4–10.5)
Creatinine, Ser: 0.65 mg/dL (ref 0.50–1.35)
GFR calc non Af Amer: >90 mL/min (ref >90)
Glucose, Bld: 153 mg/dL — ABNORMAL HIGH (ref 70–99)

## 2011-07-29 LAB — CBC
HCT: 37 % — ABNORMAL LOW (ref 39.0–52.0)
Hemoglobin: 12.3 g/dL — ABNORMAL LOW (ref 13.0–17.0)
MCH: 28.1 pg (ref 26.0–34.0)
MCHC: 33.2 g/dL (ref 30.0–36.0)
MCV: 84.5 fL (ref 78.0–100.0)

## 2011-07-29 LAB — TRIGLYCERIDES: Triglycerides: 58 mg/dL (ref <150)

## 2011-07-29 MED ORDER — KCL IN DEXTROSE-NACL 20-5-0.9 MEQ/L-%-% IV SOLN
INTRAVENOUS | Status: AC
  Administered 2011-07-30: 45 mL via INTRAVENOUS
  Administered 2011-08-04 – 2011-08-07 (×3): via INTRAVENOUS
  Filled 2011-07-29 (×10): qty 1000

## 2011-07-29 MED ORDER — M.V.I. ADULT IV INJ
INJECTION | INTRAVENOUS | Status: AC
  Administered 2011-07-29: 18:00:00 via INTRAVENOUS
  Filled 2011-07-29: qty 2000

## 2011-07-29 MED ORDER — FAT EMULSION 20 % IV EMUL
250.0000 mL | INTRAVENOUS | Status: AC
  Administered 2011-07-29: 250 mL via INTRAVENOUS
  Filled 2011-07-29: qty 250

## 2011-07-29 MED ORDER — HYDROMORPHONE HCL PF 1 MG/ML IJ SOLN
1.0000 mg | INTRAMUSCULAR | Status: DC | PRN
  Administered 2011-07-29: 2 mg via INTRAVENOUS
  Administered 2011-07-29: 1 mg via INTRAVENOUS
  Administered 2011-07-29 (×2): 2 mg via INTRAVENOUS
  Administered 2011-07-29: 1 mg via INTRAVENOUS
  Administered 2011-07-29 – 2011-07-31 (×9): 2 mg via INTRAVENOUS
  Administered 2011-07-31: 1 mg via INTRAVENOUS
  Administered 2011-07-31 (×3): 2 mg via INTRAVENOUS
  Administered 2011-08-01 (×2): 1 mg via INTRAVENOUS
  Administered 2011-08-01 (×2): 2 mg via INTRAVENOUS
  Administered 2011-08-01 – 2011-08-04 (×13): 1 mg via INTRAVENOUS
  Administered 2011-08-04: 2 mg via INTRAVENOUS
  Administered 2011-08-04 – 2011-08-05 (×2): 1 mg via INTRAVENOUS
  Administered 2011-08-05: 2 mg via INTRAVENOUS
  Administered 2011-08-05 – 2011-08-12 (×30): 1 mg via INTRAVENOUS
  Administered 2011-08-13: 1.5 mg via INTRAVENOUS
  Administered 2011-08-13 (×2): 1 mg via INTRAVENOUS
  Administered 2011-08-13: 1.5 mg via INTRAVENOUS
  Administered 2011-08-13: 0.5 mg via INTRAVENOUS
  Administered 2011-08-14 – 2011-08-15 (×6): 1 mg via INTRAVENOUS
  Administered 2011-08-15: 1.5 mg via INTRAVENOUS
  Administered 2011-08-15: 1 mg via INTRAVENOUS
  Administered 2011-08-15: 1.5 mg via INTRAVENOUS
  Administered 2011-08-15 – 2011-08-16 (×2): 1 mg via INTRAVENOUS
  Administered 2011-08-16: 0.5 mg via INTRAVENOUS
  Administered 2011-08-16: 1 mg via INTRAVENOUS
  Administered 2011-08-16 (×2): 1.5 mg via INTRAVENOUS
  Administered 2011-08-16 – 2011-08-17 (×2): 1 mg via INTRAVENOUS
  Administered 2011-08-17 (×4): 1.5 mg via INTRAVENOUS
  Filled 2011-07-29 (×7): qty 1
  Filled 2011-07-29 (×2): qty 2
  Filled 2011-07-29 (×2): qty 1
  Filled 2011-07-29: qty 2
  Filled 2011-07-29 (×2): qty 1
  Filled 2011-07-29 (×3): qty 2
  Filled 2011-07-29 (×5): qty 1
  Filled 2011-07-29: qty 2
  Filled 2011-07-29 (×4): qty 1
  Filled 2011-07-29: qty 2
  Filled 2011-07-29: qty 1
  Filled 2011-07-29 (×2): qty 2
  Filled 2011-07-29 (×5): qty 1
  Filled 2011-07-29 (×2): qty 2
  Filled 2011-07-29 (×2): qty 1
  Filled 2011-07-29 (×2): qty 2
  Filled 2011-07-29 (×3): qty 1
  Filled 2011-07-29 (×3): qty 2
  Filled 2011-07-29 (×4): qty 1
  Filled 2011-07-29: qty 2
  Filled 2011-07-29: qty 1
  Filled 2011-07-29 (×3): qty 2
  Filled 2011-07-29 (×6): qty 1
  Filled 2011-07-29: qty 2
  Filled 2011-07-29 (×5): qty 1
  Filled 2011-07-29: qty 2
  Filled 2011-07-29 (×3): qty 1
  Filled 2011-07-29 (×2): qty 2
  Filled 2011-07-29 (×2): qty 1
  Filled 2011-07-29 (×2): qty 2
  Filled 2011-07-29: qty 1
  Filled 2011-07-29: qty 2
  Filled 2011-07-29: qty 1
  Filled 2011-07-29: qty 2
  Filled 2011-07-29 (×3): qty 1
  Filled 2011-07-29: qty 2
  Filled 2011-07-29 (×5): qty 1
  Filled 2011-07-29: qty 2
  Filled 2011-07-29: qty 1
  Filled 2011-07-29: qty 2
  Filled 2011-07-29 (×4): qty 1

## 2011-07-29 NOTE — Progress Notes (Signed)
PARENTERAL NUTRITION CONSULT NOTE - FOLLOW UP  Pharmacy Consult for TNA Indication: Ileus s/p lysis of adhesions, enteroenterostomy 2/23  No Known Allergies  Patient Measurements: Height: 5\' 7"  (170.2 cm) Weight: 114 lb (51.71 kg) IBW/kg (Calculated) : 66.1   Vital Signs: Temp: 97.7 F (36.5 C) (03/06 0607) Temp src: Oral (03/06 0607) BP: 132/88 mmHg (03/06 0607) Pulse Rate: 106  (03/06 0630)  Intake/Output from previous day: 03/05 0701 - 03/06 0700 In: 2963.8 [I.V.:1510; IV Piggyback:100; TPN:1353.8] Out: 1805 [Urine:850; Emesis/NG output:750; Drains:155; Blood:50]  Labs:  [facility] 07/29/11 0545 07/28/11 0425 07/27/11 0350  WBC 18.8* 22.3* 26.7*  HGB 12.3* 11.5* 13.6  HCT 37.0* 34.1* 38.8*  PLT 398 344 380  APTT -- -- --  INR -- -- --     Basename 07/29/11 0545 07/28/11 0425 07/27/11 0350  NA 134* 140 132*  K 4.2 3.8 3.8  CL 100 105 97  CO2 27 29 29   GLUCOSE 153* 119* 122*  BUN 17 18 6   CREATININE 0.65 0.85 0.84  LABCREA -- -- --  CREAT24HRUR -- -- --  CALCIUM 8.3* 8.7 8.7  MG 1.8 -- 1.5  PHOS 3.2 -- 3.2  PROT -- -- 5.7*  ALBUMIN -- -- 2.6*  AST -- -- 27  ALT -- -- 74*  ALKPHOS -- -- 81  BILITOT -- -- 0.6  BILIDIR -- -- --  IBILI -- -- --  PREALBUMIN -- -- 12.0*  TRIG 58 -- 54  CHOLHDL -- -- --  CHOL -- -- 117   Estimated Creatinine Clearance: 77.2 ml/min (by C-G formula based on Cr of 0.65).    Basename 07/29/11 0602 07/28/11 1946 07/28/11 1446  GLUCAP 124* 124* 101*    Medications:  Scheduled:     . heparin  5,000 Units Subcutaneous Q8H  . insulin aspart  0-9 Units Subcutaneous Q8H  . lip balm  1 application Topical BID  . piperacillin-tazobactam (ZOSYN)  IV  3.375 g Intravenous Q8H   Infusions:     . dextrose 5 % and 0.9 % NaCl with KCl 20 mEq/L 70 mL/hr at 07/29/11 0600  . fat emulsion 10 kcal (07/28/11 0700)  . TPN (CLINIMIX) +/- additives 55 mL/hr at 07/29/11 0600  . TPN (CLINIMIX) +/- additives 40 mL/hr at 07/28/11 0700  .  DISCONTD: dextrose 5 % and 0.9 % NaCl with KCl 20 mEq/L 85 mL/hr at 07/28/11 0700  . DISCONTD: lactated ringers      Insulin Requirements in the past 24 hours:  3/5: 1 unit SSI  Current Nutrition:  Clinimix TNA, NPO  IVF: D-5-NS + KCl 58mEq/L at 70 mL/hr  Nutritional Goals:   Per RD assessment 3/4: 1800-2100 kCal/d, 90-105 grams/d, Fluid: 1.8-2.1L  Goal of Clinimix 5/15 at 80 ml/hr and lipid at 10 ml/hr MWF will provide 96 g of protein and 1363 Kcal/day on non lipid days, 1843 Kcal/day on lipid days and average of 1569 Kcal/day from [place].   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  Baseline prealbumin 12.0  Tolerating TPN at 55 mL/hr.  Na slightly low at 134, other lytes wnl, CBGs within goal.  Plan:   Advance to goal rate of 80 ml/hr and reduce IVF to 45 ml/hr  at 1800 tonight  Fat emulsion, MVI and trace elements only on MWF due to ongoing shortage  TNA lab panels on Mondays & Thursdays   Lynann Beaver PharmD   Pager [phone] 07/29/2011 10:49 AM

## 2011-07-29 NOTE — Progress Notes (Signed)
Nutrition Follow-up  Diet Order: NPO  TNA: Clinimix E 5/15 @ 55 ml/hr.  Lipids (20% IVFE @ 10 ml/hr), multivitamins, and trace elements are provided 3 times weekly (MWF) due to national backorder.  Provides 1143 kcal and 66 grams protein daily (based on weekly average).  Meets 63% minimum estimated kcal and 73% minimum estimated protein needs.  Additional IVF with D5NS @ 70 ml/hr.  - Attempted to meet with pt, however RN stated pt asleep. Pt had exploratory laparotomy with small bowel bypass on 2/23. POD# 1 second exploratory laparotomy during admission with drainage of intra-abdominal abscess and placement of G tube, which is currently draining.   Meds: Scheduled Meds:   . heparin  5,000 Units Subcutaneous Q8H  . insulin aspart  0-9 Units Subcutaneous Q8H  . lip balm  1 application Topical BID  . piperacillin-tazobactam (ZOSYN)  IV  3.375 g Intravenous Q8H   Continuous Infusions:   . dextrose 5 % and 0.9 % NaCl with KCl 20 mEq/L 70 mL/hr at 07/29/11 0600  . dextrose 5 % and 0.9 % NaCl with KCl 20 mEq/L    . fat emulsion 10 kcal (07/28/11 0700)  . fat emulsion    . TPN (CLINIMIX) +/- additives 55 mL/hr at 07/29/11 0600  . TPN (CLINIMIX) +/- additives 40 mL/hr at 07/28/11 0700  . TPN (CLINIMIX) +/- additives    . DISCONTD: lactated ringers     PRN Meds:.acetaminophen, bisacodyl, diphenhydrAMINE, HYDROmorphone (DILAUDID) injection, iohexol, magic mouthwash, menthol-cetylpyridinium, ondansetron (ZOFRAN) IV, ondansetron (ZOFRAN) IV, phenol, promethazine, sodium chloride, DISCONTD: acetaminophen, DISCONTD: guaiFENesin-dextromethorphan, DISCONTD: HYDROmorphone, DISCONTD:  HYDROmorphone (DILAUDID) injection, DISCONTD: ondansetron  Labs:  CMP     Component Value Date/Time   NA 134* 07/29/2011 0545   K 4.2 07/29/2011 0545   CL 100 07/29/2011 0545   CO2 27 07/29/2011 0545   GLUCOSE 153* 07/29/2011 0545   BUN 17 07/29/2011 0545   CREATININE 0.65 07/29/2011 0545   CALCIUM 8.3* 07/29/2011 0545   PROT  5.7* 07/27/2011 0350   ALBUMIN 2.6* 07/27/2011 0350   AST 27 07/27/2011 0350   ALT 74* 07/27/2011 0350   ALKPHOS 81 07/27/2011 0350   BILITOT 0.6 07/27/2011 0350   GFRNONAA >90 07/29/2011 0545   GFRAA >90 07/29/2011 0545   CBG (last 3)   Basename 07/29/11 0602 07/28/11 1946 07/28/11 1446  GLUCAP 124* 124* 101*   3/3 PALB 12  - CBGs <150, sodium slightly low, albumin remains low and likely will continue to remain low r/t inflammatory process from surgeries     Intake/Output Summary (Last 24 hours) at 07/29/11 1223 Last data filed at 07/29/11 0900  Gross per 24 hour  Intake 2993.83 ml  Output   1605 ml  Net 1388.83 ml  G-tube drainage: 100-280ml green and brown drainage Last BM - 07/23/11  Weight Status: No new weights  Re-estimated needs:   1800-2100 calories 100-120g protein  Nutrition Dx: Inadequate oral intake - ongoing  Goal: TNA to meet >90% of pt's estimated nutritional needs - not met, but advancing to goal rate with next bag.   Intervention: TNA per pharmacy. Diet advancement per MD. Awaiting return of bowel function. Recommend re-weigh pt.   Monitor: Weights, labs, bowel function   Marshall Cork Pager #: [phone]

## 2011-07-29 NOTE — Progress Notes (Signed)
Patient seen and examined.  Agree with PA's note. Has PC malnutrition and will remain on TPN until bowel function returns.

## 2011-07-29 NOTE — Plan of Care (Signed)
Problem: Inadequate Intake (NI-2.1) Goal: Food and/or nutrient delivery Individualized approach for food/nutrient provision.  Outcome: Not Progressing Pt remains NPO, relying on TNA for nutrition      

## 2011-07-29 NOTE — Progress Notes (Signed)
Patient discussed at the Long Length of Stay [name] Weeks 07/29/2011  

## 2011-07-29 NOTE — Progress Notes (Signed)
1 Day Post-Op   Subjective: No N/V. No flatus. Pain controlled.  Objective: Vital signs in last 24 hours: Temp:  [97.2 F (36.2 C)-99 F (37.2 C)] 97.7 F (36.5 C) (03/06 0607) Pulse Rate:  [94-125] 106  (03/06 0630) Resp:  [16-25] 16  (03/06 0607) BP: (96-169)/(68-153) 132/88 mmHg (03/06 0607) SpO2:  [89 %-100 %] 97 % (03/06 0607) Last BM Date: 07/23/11  Intake/Output from previous day: 03/05 0701 - 03/06 0700 In: 2963.8 [I.V.:1510; IV Piggyback:100; TPN:1353.8] Out: 1805 [Urine:850; Emesis/NG output:750; Drains:155; Blood:50]  General appearance: alert and no distress Resp: clear to auscultation bilaterally Cardio: regular rate and rhythm GI: Dressing left intact. Soft, + but diminished BS. Serosanguinous fluid in bulb.  Lab Results:   Basename 07/29/11 0545 07/28/11 0425  WBC 18.8* 22.3*  HGB 12.3* 11.5*  HCT 37.0* 34.1*  PLT 398 344   BMET  Basename 07/29/11 0545 07/28/11 0425  NA 134* 140  K 4.2 3.8  CL 100 105  CO2 27 29  GLUCOSE 153* 119*  BUN 17 18  CREATININE 0.65 0.85  CALCIUM 8.3* 8.7    Assessment/Plan: s/p Procedure(s) (LRB):  EXPLORATORY LAPAROTOMY (N/A)  S/p repeat ex lap, open gastrostomy, drainage of abscess  WBC down after surgery. On Zosyn. No signs of further purulence in drain. Expected post-op ileus, continue NPO, G-tube to gravity drain. Await return of bowel function. F/u CBC in am.  RUE dopplers neg for DVT, unsure if BLE done. However, as likely source for leukocytosis found and is improving can likely safely d/c.     LOS: 12 days    [name] J. 07/29/2011

## 2011-07-30 LAB — GLUCOSE, CAPILLARY: Glucose-Capillary: 146 mg/dL — ABNORMAL HIGH (ref 70–99)

## 2011-07-30 LAB — COMPREHENSIVE METABOLIC PANEL
AST: 16 U/L (ref 0–37)
BUN: 16 mg/dL (ref 6–23)
CO2: 29 mEq/L (ref 19–32)
Chloride: 93 mEq/L — ABNORMAL LOW (ref 96–112)
Creatinine, Ser: 0.63 mg/dL (ref 0.50–1.35)
GFR calc non Af Amer: >90 mL/min (ref >90)
Glucose, Bld: 141 mg/dL — ABNORMAL HIGH (ref 70–99)
Total Bilirubin: 0.4 mg/dL (ref 0.3–1.2)

## 2011-07-30 LAB — CBC
HCT: 33 % — ABNORMAL LOW (ref 39.0–52.0)
MCHC: 33.3 g/dL (ref 30.0–36.0)
RDW: 14.2 % (ref 11.5–15.5)

## 2011-07-30 LAB — MAGNESIUM: Magnesium: 1.9 mg/dL (ref 1.5–2.5)

## 2011-07-30 LAB — PHOSPHORUS: Phosphorus: 3.4 mg/dL (ref 2.3–4.6)

## 2011-07-30 MED ORDER — POTASSIUM CHLORIDE 10 MEQ/100ML IV SOLN
10.0000 meq | INTRAVENOUS | Status: AC
  Administered 2011-07-30 (×4): 10 meq via INTRAVENOUS
  Filled 2011-07-30 (×4): qty 100

## 2011-07-30 MED ORDER — CHLORPROMAZINE HCL 25 MG/ML IJ SOLN
25.0000 mg | Freq: Four times a day (QID) | INTRAMUSCULAR | Status: DC
  Administered 2011-07-30 – 2011-07-31 (×3): 25 mg via INTRAVENOUS
  Filled 2011-07-30 (×6): qty 1

## 2011-07-30 MED ORDER — SODIUM CHLORIDE 0.9 % IV SOLN
25.0000 mg | Freq: Four times a day (QID) | INTRAVENOUS | Status: DC | PRN
  Filled 2011-07-30: qty 1

## 2011-07-30 MED ORDER — CHLORPROMAZINE HCL 25 MG/ML IJ SOLN
25.0000 mg | Freq: Four times a day (QID) | INTRAMUSCULAR | Status: DC | PRN
  Administered 2011-07-31 – 2011-08-06 (×5): 25 mg via INTRAVENOUS
  Filled 2011-07-30 (×8): qty 1

## 2011-07-30 MED ORDER — CLINIMIX E/DEXTROSE (5/15) 5 % IV SOLN
INTRAVENOUS | Status: AC
  Administered 2011-07-30: 17:00:00 via INTRAVENOUS
  Filled 2011-07-30: qty 2000

## 2011-07-30 NOTE — Progress Notes (Signed)
PARENTERAL NUTRITION CONSULT NOTE - FOLLOW UP  Pharmacy Consult for TNA Indication: Ileus s/p lysis of adhesions, enteroenterostomy 2/23  No Known Allergies  Patient Measurements: Height: 5\' 7"  (170.2 cm) Weight: 114 lb (51.71 kg) IBW/kg (Calculated) : 66.1   Vital Signs: Temp: 97.9 F (36.6 C) (03/07 0612) Temp src: Oral (03/07 0612) BP: 128/84 mmHg (03/07 0612) Pulse Rate: 99  (03/07 0612)  Intake/Output from previous day: 03/06 0701 - 03/07 0700 In: 30  Out: 2340 [Urine:1500; Drains:840]  Labs:  [facility] 07/30/11 0335 07/29/11 0545 07/28/11 0425  WBC 12.3* 18.8* 22.3*  HGB 11.0* 12.3* 11.5*  HCT 33.0* 37.0* 34.1*  PLT 432* 398 344  APTT -- -- --  INR -- -- --     Basename 07/30/11 0335 07/29/11 0545 07/28/11 0425  NA 131* 134* 140  K 3.4* 4.2 3.8  CL 93* 100 105  CO2 29 27 29   GLUCOSE 141* 153* 119*  BUN 16 17 18   CREATININE 0.63 0.65 0.85  LABCREA -- -- --  CREAT24HRUR -- -- --  CALCIUM 8.4 8.3* 8.7  MG 1.9 1.8 --  PHOS 3.4 3.2 --  PROT 5.4* -- --  ALBUMIN 2.1* -- --  AST 16 -- --  ALT 26 -- --  ALKPHOS 71 -- --  BILITOT 0.4 -- --  BILIDIR -- -- --  IBILI -- -- --  PREALBUMIN -- -- --  TRIG -- 58 --  CHOLHDL -- -- --  CHOL -- -- --   Estimated Creatinine Clearance: 77.2 ml/min (by C-G formula based on Cr of 0.63).    Basename 07/30/11 0606 07/29/11 2154 07/29/11 1428  GLUCAP 146* 135* 159*    Medications:  Scheduled:     . heparin  5,000 Units Subcutaneous Q8H  . insulin aspart  0-9 Units Subcutaneous Q8H  . lip balm  1 application Topical BID  . piperacillin-tazobactam (ZOSYN)  IV  3.375 g Intravenous Q8H   Infusions:     . dextrose 5 % and 0.9 % NaCl with KCl 20 mEq/L 70 mL/hr at 07/29/11 0600  . dextrose 5 % and 0.9 % NaCl with KCl 20 mEq/L 45 mL/hr at 07/29/11 1800  . fat emulsion 250 mL (07/29/11 1743)  . TPN (CLINIMIX) +/- additives 55 mL/hr at 07/29/11 0600  . TPN (CLINIMIX) +/- additives 80 mL/hr at 07/29/11 1743     Insulin Requirements in the past 24 hours:  3/6 CBGs: 124-159, 4 units SSI used  Current Nutrition:  Clinimix TNA, NPO  IVF: D-5-NS + KCl 30mEq/L at 45 mL/hr  Nutritional Goals:   Per RD assessment 3/4: 1800-2100 kCal/d, 90-105 grams/d, Fluid: 1.8-2.1L  Goal of Clinimix 5/15 at 80 ml/hr and lipid at 10 ml/hr MWF will provide 96 g of protein and 1363 Kcal/day on non lipid days, 1843 Kcal/day on lipid days and average of 1569 Kcal/day from [place].   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  Baseline prealbumin 12.0  Tolerating TPN at goal rate of 80 mL/hr.    Na remains low at 131, K and Cl also low.  Plan:   Continue Clinimix E 5/15 at goal rate of 80 ml/hr  KCl IV x 4 doses  Fat emulsion,  MVI and trace elements only on MWF due to ongoing shortage  TNA lab panels on Mondays & Thursdays   [facility] PharmD, BCPS Pager [phone] 07/30/2011 8:30 AM

## 2011-07-30 NOTE — Progress Notes (Signed)
2 Days Post-Op   Subjective: C/o hiccups. No N/V. No flatus. Pain controlled.  Objective: Vital signs in last 24 hours: Temp:  [97.7 F (36.5 C)-97.9 F (36.6 C)] 97.9 F (36.6 C) (03/07 0612) Pulse Rate:  [99] 99  (03/07 0612) Resp:  [16-18] 16  (03/07 0612) BP: (121-128)/(81-84) 128/84 mmHg (03/07 0612) SpO2:  [99 %-100 %] 99 % (03/07 0612) Last BM Date: 07/23/11  Intake/Output from previous day: 03/06 0701 - 03/07 0700 In: 30  Out: 2340 [Urine:1500; Drains:840]  JP: 56ml/24h GT: 75ml/24h   General appearance: alert and no distress Resp: clear to auscultation bilaterally Cardio: regular rate and rhythm GI: Soft, moderately TTP, minimal BS. Wound bed dark red to dusky, no odor or purulence noted.  Lab Results:   Basename 07/30/11 0335 07/29/11 0545  WBC 12.3* 18.8*  HGB 11.0* 12.3*  HCT 33.0* 37.0*  PLT 432* 398   BMET  Basename 07/30/11 0335 07/29/11 0545  NA 131* 134*  K 3.4* 4.2  CL 93* 100  CO2 29 27  GLUCOSE 141* 153*  BUN 16 17  CREATININE 0.63 0.65  CALCIUM 8.4 8.3*    Assessment/Plan: s/p Procedure(s) (LRB):  EXPLORATORY LAPAROTOMY (N/A)  S/p repeat ex lap, open gastrostomy, drainage of abscess  WBC continues to decline. On Zosyn. No signs of further purulence in drain.  Expected post-op ileus, continue NPO, G-tube to gravity drain.  Await return of bowel function.  F/u CBC in am.  Will give thorazine for hiccups.     LOS: 13 days    [name] J. 07/30/2011

## 2011-07-30 NOTE — Progress Notes (Signed)
-  Thorazine RTC today, then PRN -f/u cultures.  Add antifungal if no improvement -mobilize more

## 2011-07-31 LAB — BASIC METABOLIC PANEL
BUN: 14 mg/dL (ref 6–23)
Chloride: 92 mEq/L — ABNORMAL LOW (ref 96–112)
Creatinine, Ser: 0.68 mg/dL (ref 0.50–1.35)
GFR calc Af Amer: >90 mL/min (ref >90)
Glucose, Bld: 126 mg/dL — ABNORMAL HIGH (ref 70–99)

## 2011-07-31 LAB — CBC
HCT: 33.2 % — ABNORMAL LOW (ref 39.0–52.0)
Hemoglobin: 11.4 g/dL — ABNORMAL LOW (ref 13.0–17.0)
MCV: 82.8 fL (ref 78.0–100.0)
RDW: 13.8 % (ref 11.5–15.5)
WBC: 8.3 10*3/uL (ref 4.0–10.5)

## 2011-07-31 MED ORDER — SODIUM CHLORIDE 1 G PO TABS
1.0000 g | ORAL_TABLET | Freq: Three times a day (TID) | ORAL | Status: DC
  Administered 2011-07-31 (×3): 1 g via ORAL
  Filled 2011-07-31 (×6): qty 1

## 2011-07-31 MED ORDER — TRACE MINERALS CR-CU-MN-SE-ZN 10-1000-500-60 MCG/ML IV SOLN
INTRAVENOUS | Status: AC
  Administered 2011-07-31: 17:00:00 via INTRAVENOUS
  Filled 2011-07-31: qty 2000

## 2011-07-31 MED ORDER — FAT EMULSION 20 % IV EMUL
250.0000 mL | INTRAVENOUS | Status: AC
  Administered 2011-07-31: 250 mL via INTRAVENOUS
  Filled 2011-07-31: qty 250

## 2011-07-31 NOTE — Progress Notes (Signed)
PARENTERAL NUTRITION CONSULT NOTE - FOLLOW UP  Pharmacy Consult for TNA Indication: Ileus s/p lysis of adhesions, enteroenterostomy 2/23  No Known Allergies  Patient Measurements: Height: 5\' 7"  (170.2 cm) Weight: 114 lb (51.71 kg) IBW/kg (Calculated) : 66.1   Vital Signs: Temp: 97.2 F (36.2 C) (03/08 0600) Temp src: Oral (03/08 0600) BP: 101/71 mmHg (03/08 0600) Pulse Rate: 107  (03/08 0600)  Intake/Output from previous day: 03/07 0701 - 03/08 0700 In: 14500.2 [I.V.:9999; IV Piggyback:325; JYN:8295.6] Out: 2870 [Urine:2650; Drains:220] (Input on 3/7 not documented correctly)  Labs:  [facility] 07/31/11 0425 07/30/11 0335 07/29/11 0545  WBC 8.3 12.3* 18.8*  HGB 11.4* 11.0* 12.3*  HCT 33.2* 33.0* 37.0*  PLT 406* 432* 398  APTT -- -- --  INR -- -- --     Basename 07/31/11 0425 07/30/11 0335 07/29/11 0545  NA 129* 131* 134*  K 4.0 3.4* 4.2  CL 92* 93* 100  CO2 32 29 27  GLUCOSE 126* 141* 153*  BUN 14 16 17   CREATININE 0.68 0.63 0.65  LABCREA -- -- --  CREAT24HRUR -- -- --  CALCIUM 8.7 8.4 8.3*  MG -- 1.9 1.8  PHOS -- 3.4 3.2  PROT -- 5.4* --  ALBUMIN -- 2.1* --  AST -- 16 --  ALT -- 26 --  ALKPHOS -- 71 --  BILITOT -- 0.4 --  BILIDIR -- -- --  IBILI -- -- --  PREALBUMIN -- -- --  TRIG -- -- 58  CHOLHDL -- -- --  CHOL -- -- --   Estimated Creatinine Clearance: 77.2 ml/min (by C-G formula based on Cr of 0.68).    Basename 07/31/11 0625 07/30/11 2230 07/30/11 1430  GLUCAP 115* 114* 111*    Medications:  Scheduled:     . chlorproMAZINE (THORAZINE) IV  25 mg Intravenous QID  . heparin  5,000 Units Subcutaneous Q8H  . insulin aspart  0-9 Units Subcutaneous Q8H  . lip balm  1 application Topical BID  . piperacillin-tazobactam (ZOSYN)  IV  3.375 g Intravenous Q8H  . potassium chloride  10 mEq Intravenous Q1 Hr x 4   Infusions:     . dextrose 5 % and 0.9 % NaCl with KCl 20 mEq/L 45 mL (07/30/11 2141)  . fat emulsion 250 mL (07/29/11 1743)  . TPN  (CLINIMIX) +/- additives 80 mL/hr at 07/30/11 1727  . TPN (CLINIMIX) +/- additives 80 mL/hr at 07/29/11 1743    Insulin Requirements in the past 24 hours:  3/7 CBGs: 111146, 2 units SSI used  Current Nutrition:  Clinimix TNA, NPO  IVF: D-5-NS + KCl 71mEq/L at 45 mL/hr  Nutritional Goals:   Per RD assessment 3/4: 1800-2100 kCal/d, 90-105 grams/d, Fluid: 1.8-2.1L  Goal of Clinimix 5/15 at 80 ml/hr and lipid at 10 ml/hr MWF will provide 96 g of protein and 1363 Kcal/day on non lipid days, 1843 Kcal/day on lipid days and average of 1569 Kcal/day from [place].   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  Baseline prealbumin 12.0  Tolerating TPN at goal rate  Na and Cl remains low at, K improved s/p KCl runs on 3/7  Plan:   Continue Clinimix E 5/15  at goal rate of 80 ml/hr  Fat emulsion, MVI and trace elements only on MWF due to ongoing shortage  TNA lab panels on Mondays & Thursdays   [facility] PharmD, BCPS Pager [phone] 07/31/2011 8:17 AM

## 2011-07-31 NOTE — Progress Notes (Signed)
Nutrition Follow-up  Diet Order:  NPO  Patient is receiving Clinimix 5/15 at goal rate of 80 ml/hr. Lipid (20% IVFE at 10 ml/hr), multivitamins, and trace elements are being administered MWF d/t Sport and exercise psychologist.  This is providing average of 1569 Kcal (87% of estimated energy needs), and 96 gram protein (96% of estimated protein needs)  - Na and Cl are low. Hyponatremia is being addressed by decreasing IVF and NaCI tablets.  - Patient was repleted with KCI IV x 4 doses and hypokalemia has improved from 3.4 on 3/6 to 4 on 3/07.  - CBGs <150 - Albumin stable and likely not to improve soon r/t inflammatory process from surgeries and wounds - PALB 12 on 3/4  At 3 Days Post-op, patient's WBC continues to decline to normal levels. Is receiving Zosyn for intraabdominal abscess. Expected post-op ileus, has G-tube to gravity drain.  No recent BMs to document.  Patient reported no nausea/vomiting, but is experiencing constipation. RN reported patient has not passed gas yet, and due to post-op status, they are waiting for flatus before providing patient with Dulcolax suppository.    Meds: Scheduled Meds:   . chlorproMAZINE (THORAZINE) IV  25 mg Intravenous QID  . heparin  5,000 Units Subcutaneous Q8H  . insulin aspart  0-9 Units Subcutaneous Q8H  . lip balm  1 application Topical BID  . piperacillin-tazobactam (ZOSYN)  IV  3.375 g Intravenous Q8H  . potassium chloride  10 mEq Intravenous Q1 Hr x 4   Continuous Infusions:   . dextrose 5 % and 0.9 % NaCl with KCl 20 mEq/L 45 mL (07/30/11 2141)  . fat emulsion 250 mL (07/29/11 1743)  . fat emulsion    . TPN (CLINIMIX) +/- additives 80 mL/hr at 07/30/11 1727  . TPN (CLINIMIX) +/- additives 80 mL/hr at 07/29/11 1743  . TPN (CLINIMIX) +/- additives     PRN Meds:.acetaminophen, bisacodyl, chlorproMAZINE (THORAZINE) IV, diphenhydrAMINE, HYDROmorphone (DILAUDID) injection, magic mouthwash, menthol-cetylpyridinium, ondansetron (ZOFRAN) IV,  promethazine, sodium chloride  Labs:  CMP     Component Value Date/Time   NA 129* 07/31/2011 0425   K 4.0 07/31/2011 0425   CL 92* 07/31/2011 0425   CO2 32 07/31/2011 0425   GLUCOSE 126* 07/31/2011 0425   BUN 14 07/31/2011 0425   CREATININE 0.68 07/31/2011 0425   CALCIUM 8.7 07/31/2011 0425   PROT 5.4* 07/30/2011 0335   ALBUMIN 2.1* 07/30/2011 0335   AST 16 07/30/2011 0335   ALT 26 07/30/2011 0335   ALKPHOS 71 07/30/2011 0335   BILITOT 0.4 07/30/2011 0335   GFRNONAA >90 07/31/2011 0425   GFRAA >90 07/31/2011 0425  Magnesium 1.9 WNL (03/07)  CBG (last 3)   Basename 07/31/11 0625 07/30/11 2230 07/30/11 1430  GLUCAP 115* 114* 111*    Intake/Output Summary (Last 24 hours) at 07/31/11 0909 Last data filed at 07/31/11 0908  Gross per 24 hour  Intake 14500.2 ml  Output   3070 ml  Net 11430.2 ml  Has 50-100 ml brown output from G-tube drainage per doc flowsheets  Last BM - 07/23/11  Weight Status:  114 lb (51.7 kg) on 2/22. No updated weights  Estimated needs:  1800-2100 kcal, 100-120 gram protein  Nutrition Dx:  Inadequate oral intake - ongoing  Goal:  TNA to meet >90% of pt's estimated nutritional needs - Met as closely as possible with premixed formula, goal rate is currently providing average of 87% of estimated needs   Intervention:   Monitor for diet advancement per  MD   Continue to await return of bowel function, and recommend Dulcolax supplementation to treat constipation when flatus returns  Re-weigh patient for more current weight  Monitor: I/O, weights, labs, bowel function, diet advancements   Lloyd Huger Pager #:  [phone]  Marshall Cork Pager #:  [phone]

## 2011-07-31 NOTE — Progress Notes (Signed)
3 Days Post-Op   Subjective: Hiccups better. Didn't sleep well due to soreness but thinks pain control is adequate. No N/V. No flatus.  Objective: Vital signs in last 24 hours: Temp:  [97.2 F (36.2 C)-98.4 F (36.9 C)] 97.2 F (36.2 C) (03/08 0600) Pulse Rate:  [99-107] 107  (03/08 0600) Resp:  [16-17] 16  (03/08 0600) BP: (101-115)/(71-77) 101/71 mmHg (03/08 0600) SpO2:  [97 %-100 %] 97 % (03/08 0600) Last BM Date: 07/23/11  Intake/Output from previous day: 03/07 0701 - 03/08 0700 In: 14500.2 [I.V.:9999; IV Piggyback:325; JWJ:1914.7] Out: 2870 [Urine:2650; Drains:220] JP: 63ml/24h GT: 181ml/24h  General appearance: alert and no distress Resp: clear to auscultation bilaterally Cardio: regular rate and rhythm GI: Soft, mild-mod diffuse TTP. Wound with dusky granulation, no odor, no purulence encountered. Drain sites WNL. JP with bile stained fluid w/particulate matter. Bilious drainage from g-tube. +BS, slightly diminished.  Lab Results:   Basename 07/31/11 0425 07/30/11 0335  WBC 8.3 12.3*  HGB 11.4* 11.0*  HCT 33.2* 33.0*  PLT 406* 432*   BMET  Basename 07/31/11 0425 07/30/11 0335  NA 129* 131*  K 4.0 3.4*  CL 92* 93*  CO2 32 29  GLUCOSE 126* 141*  BUN 14 16  CREATININE 0.68 0.63  CALCIUM 8.7 8.4    Assessment/Plan: SBO s/p ex lap, SBR S/p repeat ex lap, open gastrostomy, drainage of abscess  Ileus -- Continue NPO for now. Suspect flatus in next 24h given good BS. ID -- Zosyn D#4 empiric for intraabdominal abscess. JP in place. WBC decreased to normal, afebrile. Hiccups -- Improved on thorazine. Hyponatremia -- Decrease IVF, add NaCl tabs. Hypokalemia -- Improved FEN -- TPN VTE -- SQH   LOS: 14 days    Kisha Messman J. 07/31/2011

## 2011-07-31 NOTE — Plan of Care (Signed)
Problem: Inadequate Intake (NI-2.1) Goal: Food and/or nutrient delivery Individualized approach for food/nutrient provision.  Outcome: Not Progressing Pt remains NPO, relying on TNA for nutritional needs

## 2011-08-01 LAB — CBC
HCT: 32.6 % — ABNORMAL LOW (ref 39.0–52.0)
Hemoglobin: 10.9 g/dL — ABNORMAL LOW (ref 13.0–17.0)
MCH: 27.7 pg (ref 26.0–34.0)
MCV: 82.7 fL (ref 78.0–100.0)
Platelets: 483 10*3/uL — ABNORMAL HIGH (ref 150–400)
RBC: 3.94 MIL/uL — ABNORMAL LOW (ref 4.22–5.81)

## 2011-08-01 LAB — BASIC METABOLIC PANEL
BUN: 16 mg/dL (ref 6–23)
CO2: 30 mEq/L (ref 19–32)
Calcium: 8.9 mg/dL (ref 8.4–10.5)
Chloride: 94 mEq/L — ABNORMAL LOW (ref 96–112)
Creatinine, Ser: 0.68 mg/dL (ref 0.50–1.35)
Glucose, Bld: 114 mg/dL — ABNORMAL HIGH (ref 70–99)

## 2011-08-01 MED ORDER — CLINIMIX E/DEXTROSE (5/15) 5 % IV SOLN
INTRAVENOUS | Status: AC
  Administered 2011-08-01: 18:00:00 via INTRAVENOUS
  Filled 2011-08-01: qty 2000

## 2011-08-01 MED ORDER — GUAIFENESIN ER 600 MG PO TB12
600.0000 mg | ORAL_TABLET | Freq: Two times a day (BID) | ORAL | Status: DC | PRN
Start: 2011-08-01 — End: 2011-08-20
  Administered 2011-08-01 – 2011-08-03 (×4): 600 mg via ORAL
  Filled 2011-08-01 (×4): qty 1

## 2011-08-01 NOTE — Progress Notes (Signed)
4 Days Post-Op  Subjective: No flatus. Still with hiccups. No bm. Pain ok  Objective: Vital signs in last 24 hours: Temp:  [97.5 F (36.4 C)-98.2 F (36.8 C)] 98.1 F (36.7 C) (03/09 0636) Pulse Rate:  [86-101] 86  (03/09 0636) Resp:  [16-18] 18  (03/09 0636) BP: (101-106)/(65-74) 106/72 mmHg (03/09 0636) SpO2:  [96 %-100 %] 100 % (03/09 0636) Last BM Date: 07/23/11  Intake/Output from previous day: 03/08 0701 - 03/09 0700 In: 1465 [I.V.:505; TPN:960] Out: 2770 [Urine:1725; Drains:1045] Intake/Output this shift: Total I/O In: -  Out: 300 [Urine:300]  Alert, nad cta Soft, flat quiet. g tube to gravity- 1000cc/24hrs jp - bile tinged, 35cc/24hrs  Lab Results:   Basename 08/01/11 0320 07/31/11 0425  WBC 8.7 8.3  HGB 10.9* 11.4*  HCT 32.6* 33.2*  PLT 483* 406*   BMET  Basename 08/01/11 0320 07/31/11 0425  NA 131* 129*  K 4.1 4.0  CL 94* 92*  CO2 30 32  GLUCOSE 114* 126*  BUN 16 14  CREATININE 0.68 0.68  CALCIUM 8.9 8.7   PT/INR No results found for this basename: LABPROT:2,INR:2 in the last 72 hours ABG No results found for this basename: PHART:2,PCO2:2,PO2:2,HCO3:2 in the last 72 hours  Studies/Results: No results found.  Anti-infectives: Anti-infectives     Start     Dose/Rate Route Frequency Ordered Stop   07/28/11 2000   piperacillin-tazobactam (ZOSYN) IVPB 3.375 g        3.375 g 12.5 mL/hr over 240 Minutes Intravenous 3 times per day 07/28/11 1914     07/18/11 1135   ertapenem (INVANZ) 1 g in sodium chloride 0.9 % 50 mL IVPB        1 g 100 mL/hr over 30 Minutes Intravenous 60 min pre-op 07/18/11 1135 07/18/11 1235          Assessment/Plan: s/p Procedure(s) (LRB): EXPLORATORY LAPAROTOMY (N/A) GASTROSTOMY TUBE (N/A) INCISION AND DRAINAGE ABSCESS (N/A)   Cont iv abx for abscess Cont tpn Cont g tube to gravity for now.  [name]. Patrick Campanile, MD, FACS General, Bariatric, & Minimally Invasive Surgery [facility], [place]   LOS: 15  days    [name] 08/01/2011

## 2011-08-01 NOTE — Progress Notes (Signed)
Dr. [name] aware via phone of pt c/o head congestion and pressure. Also made aware that pt refused am Sodium Chloride tablet stating " It made me sick last night ". See new orders received and entered into EPIC.

## 2011-08-01 NOTE — Progress Notes (Signed)
PARENTERAL NUTRITION CONSULT NOTE - FOLLOW UP  Pharmacy Consult for TNA Indication: Ileus s/p lysis of adhesions, enteroenterostomy 2/23  No Known Allergies  Patient Measurements: Height: 5\' 7"  (170.2 cm) Weight: 114 lb (51.71 kg) IBW/kg (Calculated) : 66.1   Vital Signs: Temp: 98.1 F (36.7 C) (03/09 0636) Temp src: Oral (03/09 0636) BP: 106/72 mmHg (03/09 0636) Pulse Rate: 86  (03/09 0636)  Intake/Output from previous day: 03/08 0701 - 03/09 0700 In: 1465 [I.V.:505; TPN:960] Out: 2770 [Urine:1725; Drains:1045]   Labs:  [facility] 08/01/11 0320 07/31/11 0425 07/30/11 0335  WBC 8.7 8.3 12.3*  HGB 10.9* 11.4* 11.0*  HCT 32.6* 33.2* 33.0*  PLT 483* 406* 432*  APTT -- -- --  INR -- -- --     Basename 08/01/11 0320 07/31/11 0425 07/30/11 0335  NA 131* 129* 131*  K 4.1 4.0 3.4*  CL 94* 92* 93*  CO2 30 32 29  GLUCOSE 114* 126* 141*  BUN 16 14 16   CREATININE 0.68 0.68 0.63  LABCREA -- -- --  CREAT24HRUR -- -- --  CALCIUM 8.9 8.7 8.4  MG -- -- 1.9  PHOS -- -- 3.4  PROT -- -- 5.4*  ALBUMIN -- -- 2.1*  AST -- -- 16  ALT -- -- 26  ALKPHOS -- -- 71  BILITOT -- -- 0.4  BILIDIR -- -- --  IBILI -- -- --  PREALBUMIN -- -- --  TRIG -- -- --  CHOLHDL -- -- --  CHOL -- -- --   Estimated Creatinine Clearance: 77.2 ml/min (by C-G formula based on Cr of 0.68).    Basename 08/01/11 0623 07/31/11 2224 07/31/11 1441  GLUCAP 105* 86 111*    Medications:  Scheduled:     . heparin  5,000 Units Subcutaneous Q8H  . insulin aspart  0-9 Units Subcutaneous Q8H  . lip balm  1 application Topical BID  . piperacillin-tazobactam (ZOSYN)  IV  3.375 g Intravenous Q8H  . sodium chloride  1 g Oral TID  . DISCONTD: chlorproMAZINE (THORAZINE) IV  25 mg Intravenous QID   Infusions:     . dextrose 5 % and 0.9 % NaCl with KCl 20 mEq/L 40 mL/hr (07/31/11 1214)  . fat emulsion 250 mL (07/31/11 1712)  . TPN (CLINIMIX) +/- additives 80 mL/hr at 07/30/11 1727  . TPN (CLINIMIX) +/-  additives 80 mL/hr at 07/31/11 1711    Insulin Requirements in the past 24 hours:  3/8 CBGs: 86-115, 3 units SSI used  Current Nutrition:  Clinimix TNA at goal 80 ml/hr Lipids 20% at 10 ml/hr NPO  IVF: D-5-NS + KCl 11mEq/L at 45 mL/hr  Nutritional Goals:   Per RD assessment 3/4: 1800-2100 kCal/d, 90-105 grams/d, Fluid: 1.8-2.1L  Goal of Clinimix 5/15 at 80 ml/hr and lipid at 10 ml/hr MWF will provide 96 g of protein and 1363 Kcal/day on non lipid days, 1843 Kcal/day on lipid days and average of 1569 Kcal/day from [place].   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  Baseline prealbumin 12.0  Tolerating TPN at goal rate  CBGs within goal <150  Na+ remains slightly low, po supplement added 3/8  Plan:   Continue Clinimix E 5/15  at goal rate of 80 ml/hr  Fat emulsion, MVI and trace elements only on MWF due to ongoing shortage  TNA lab panels on Mondays & Thursdays  Loralee Pacas, PharmD, BCPS Pager: [phone] 08/01/2011 7:17 AM

## 2011-08-01 NOTE — Progress Notes (Signed)
ContinueABx - lower WBC and pain hopeful signs Follow drain - still with discoloration - try to control.  Hopefully not developing SB leak/fistula but poor OR candidate with 2 surgeries, so continue to manage with TNA, drainage, & bowel rest

## 2011-08-02 LAB — GLUCOSE, CAPILLARY: Glucose-Capillary: 118 mg/dL — ABNORMAL HIGH (ref 70–99)

## 2011-08-02 MED ORDER — CLINIMIX E/DEXTROSE (5/15) 5 % IV SOLN
INTRAVENOUS | Status: AC
  Administered 2011-08-02: 19:00:00 via INTRAVENOUS
  Filled 2011-08-02: qty 2000

## 2011-08-02 NOTE — Progress Notes (Signed)
Patient ID: [name], male   DOB: April 25, 1956, 56 y.o.   MRN: [id number] [facility] Progress Note:   5 Days Post-Op  Subjective: Mental status is clear.  "I feel a hell of a lot better".  Had BM Objective: Vital signs in last 24 hours: Temp:  [97.6 F (36.4 C)-97.9 F (36.6 C)] 97.9 F (36.6 C) (03/10 0611) Pulse Rate:  [86-105] 105  (03/10 0611) Resp:  [18] 18  (03/10 0611) BP: (92-121)/(59-82) 121/82 mmHg (03/10 0611) SpO2:  [98 %-100 %] 98 % (03/10 0611)  Intake/Output from previous day: 03/09 0701 - 03/10 0700 In: 3429 [I.V.:788; IV Piggyback:625; TPN:2016] Out: 1560 [Urine:775; Drains:785] Intake/Output this shift:    Physical Exam: Work of breathing is  Normal.  JP and G tube with green drainage.  JP minimal  Lab Results:  Results for orders placed during the hospital encounter of 07/17/11 (from the past 48 hour(s))  GLUCOSE, CAPILLARY     Status: Abnormal   Collection Time   07/31/11  2:41 PM      Component Value Range Comment   Glucose-Capillary 111 (*) 70 - 99 (mg/dL)   GLUCOSE, CAPILLARY     Status: Normal   Collection Time   07/31/11 10:24 PM      Component Value Range Comment   Glucose-Capillary 86  70 - 99 (mg/dL)   CBC     Status: Abnormal   Collection Time   08/01/11  3:20 AM      Component Value Range Comment   WBC 8.7  4.0 - 10.5 (K/uL)    RBC 3.94 (*) 4.22 - 5.81 (MIL/uL)    Hemoglobin 10.9 (*) 13.0 - 17.0 (g/dL)    HCT 40.9 (*) 81.1 - 52.0 (%)    MCV 82.7  78.0 - 100.0 (fL)    MCH 27.7  26.0 - 34.0 (pg)    MCHC 33.4  30.0 - 36.0 (g/dL)    RDW 91.4  78.2 - 95.6 (%)    Platelets 483 (*) 150 - 400 (K/uL)   BASIC METABOLIC PANEL     Status: Abnormal   Collection Time   08/01/11  3:20 AM      Component Value Range Comment   Sodium 131 (*) 135 - 145 (mEq/L)    Potassium 4.1  3.5 - 5.1 (mEq/L)    Chloride 94 (*) 96 - 112 (mEq/L)    CO2 30  19 - 32 (mEq/L)    Glucose, Bld 114 (*) 70 - 99 (mg/dL)    BUN 16  6 - 23 (mg/dL)    Creatinine,  Ser 2.13  0.50 - 1.35 (mg/dL)    Calcium 8.9  8.4 - 10.5 (mg/dL)    GFR calc non Af Amer >90  >90 (mL/min)    GFR calc Af Amer >90  >90 (mL/min)   GLUCOSE, CAPILLARY     Status: Abnormal   Collection Time   08/01/11  6:23 AM      Component Value Range Comment   Glucose-Capillary 105 (*) 70 - 99 (mg/dL)   GLUCOSE, CAPILLARY     Status: Abnormal   Collection Time   08/01/11  1:53 PM      Component Value Range Comment   Glucose-Capillary 133 (*) 70 - 99 (mg/dL)   GLUCOSE, CAPILLARY     Status: Abnormal   Collection Time   08/01/11  9:26 PM      Component Value Range Comment   Glucose-Capillary 109 (*) 70 - 99 (mg/dL)  GLUCOSE, CAPILLARY     Status: Abnormal   Collection Time   08/02/11  2:28 AM      Component Value Range Comment   Glucose-Capillary 145 (*) 70 - 99 (mg/dL)   GLUCOSE, CAPILLARY     Status: Abnormal   Collection Time   08/02/11  5:37 AM      Component Value Range Comment   Glucose-Capillary 118 (*) 70 - 99 (mg/dL)     Radiology/Results: No results found.  Anti-infectives: Anti-infectives     Start     Dose/Rate Route Frequency Ordered Stop   07/28/11 2000  piperacillin-tazobactam (ZOSYN) IVPB 3.375 g       3.375 g 12.5 mL/hr over 240 Minutes Intravenous 3 times per day 07/28/11 1914     07/18/11 1135   ertapenem (INVANZ) 1 g in sodium chloride 0.9 % 50 mL IVPB        1 g 100 mL/hr over 30 Minutes Intravenous 60 min pre-op 07/18/11 1135 07/18/11 1235          Assessment/Plan: Problem List: Patient Active Problem List  Diagnoses  . ANXIETY  . PSYCHIATRIC DISORDER  . DEPRESSION  . PUD  . GI BLEEDING  . ARTHRITIS  . SCOLIOSIS  . IMPERFORATE ANUS  . FECAL INCONTINENCE  . INGUINAL HERNIA, HX OF  . FATIGUE  . DYSPNEA  . Obstipation  . Nausea  . Ileus following gastrointestinal surgery  . SBO (small bowel obstruction)    Feeling better.  Will offer clear liquids. 5 Days Post-Op    LOS: 16 days   Matt B. Daphine Deutscher, MD, [facility] [phone] beeper [phone]  08/02/2011 8:39 AM

## 2011-08-02 NOTE — Progress Notes (Signed)
PARENTERAL NUTRITION CONSULT NOTE - FOLLOW UP  Pharmacy Consult for TNA Indication: Ileus s/p lysis of adhesions, enteroenterostomy 2/23  No Known Allergies  Patient Measurements: Height: 5\' 7"  (170.2 cm) Weight: 114 lb (51.71 kg) IBW/kg (Calculated) : 66.1   Vital Signs: Temp: 97.9 F (36.6 C) (03/10 0611) Temp src: Oral (03/10 0611) BP: 121/82 mmHg (03/10 0611) Pulse Rate: 105  (03/10 0611)  Intake/Output from previous day: 03/09 0701 - 03/10 0700 In: 3429 [I.V.:788; IV Piggyback:625; TPN:2016] Out: 1560 [Urine:775; Drains:785] I/O +1869 (3/9)   Labs:  [facility] 08/01/11 0320 07/31/11 0425  WBC 8.7 8.3  HGB 10.9* 11.4*  HCT 32.6* 33.2*  PLT 483* 406*  APTT -- --  INR -- --     Basename 08/01/11 0320 07/31/11 0425  NA 131* 129*  K 4.1 4.0  CL 94* 92*  CO2 30 32  GLUCOSE 114* 126*  BUN 16 14  CREATININE 0.68 0.68  LABCREA -- --  CREAT24HRUR -- --  CALCIUM 8.9 8.7  MG -- --  PHOS -- --  PROT -- --  ALBUMIN -- --  AST -- --  ALT -- --  ALKPHOS -- --  BILITOT -- --  BILIDIR -- --  IBILI -- --  PREALBUMIN -- --  TRIG -- --  CHOLHDL -- --  CHOL -- --   Estimated Creatinine Clearance: 77.2 ml/min (by C-G formula based on Cr of 0.68).    Basename 08/02/11 0537 08/02/11 0228 08/01/11 2126  GLUCAP 118* 145* 109*   Insulin Requirements in the past 24 hours:  CBGs: 105-145, 1 units SSI used   Medications:  Scheduled:     . heparin  5,000 Units Subcutaneous Q8H  . insulin aspart  0-9 Units Subcutaneous Q8H  . lip balm  1 application Topical BID  . piperacillin-tazobactam (ZOSYN)  IV  3.375 g Intravenous Q8H  . DISCONTD: sodium chloride  1 g Oral TID   Infusions:     . dextrose 5 % and 0.9 % NaCl with KCl 20 mEq/L 40 mL/hr (07/31/11 1214)  . fat emulsion 250 mL (08/01/11 1400)  . TPN (CLINIMIX) +/- additives 80 mL/hr at 08/01/11 1749  . TPN (CLINIMIX) +/- additives 80 mL/hr at 07/31/11 1711     Current Nutrition:  Clinimix TNA at goal  80 ml/hr NPO  IVF: D-5-NS + KCl 63mEq/L at 45 mL/hr  Nutritional Goals:   Per RD assessment 3/4: 1800-2100 kCal/d, 90-105 grams/d, Fluid: 1.8-2.1L  Goal of Clinimix 5/15 at 80 ml/hr and lipid at 10 ml/hr MWF will provide 96 g of protein and 1363 Kcal/day on non lipid days, 1843 Kcal/day on lipid days and average of 1569 Kcal/day from [place].   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  Baseline prealbumin 12.0  Tolerating TPN at goal rate  CBGs within goal <150  No labs today  Plan:   Continue Clinimix E 5/15 at goal rate of 80 ml/hr  Fat emulsion, MVI and trace elements only on MWF due to ongoing shortage  TNA lab panels on Mondays & Thursdays  Loralee Pacas, PharmD, BCPS Pager: [phone] 08/02/2011 7:57 AM

## 2011-08-02 NOTE — Progress Notes (Signed)
Pt. Complaint of congestion to facial sinuses, informed that voucher for mucinex sent to pharmacy. Dr. Daphine Deutscher on floor making rounds, pt. Alert and oriented but occasionally has difficulty understanding with conversation but easily has understanding with verbal cues. Pt. Complaint of pain rated 6/10 to left upper quadrant, pain medication offered but pt. Refuses at this time, told to call and states understanding.

## 2011-08-02 NOTE — Progress Notes (Signed)
Pt. oob to chair. States he no longer wants mucinex at this time. States pain much improved.

## 2011-08-03 LAB — CBC
HCT: 35.2 % — ABNORMAL LOW (ref 39.0–52.0)
Hemoglobin: 12.1 g/dL — ABNORMAL LOW (ref 13.0–17.0)
MCH: 28.5 pg (ref 26.0–34.0)
MCHC: 34.4 g/dL (ref 30.0–36.0)

## 2011-08-03 LAB — GLUCOSE, CAPILLARY
Glucose-Capillary: 126 mg/dL — ABNORMAL HIGH (ref 70–99)
Glucose-Capillary: 104 mg/dL — ABNORMAL HIGH (ref 70–99)
Glucose-Capillary: 125 mg/dL — ABNORMAL HIGH (ref 70–99)

## 2011-08-03 LAB — COMPREHENSIVE METABOLIC PANEL
Albumin: 2.8 g/dL — ABNORMAL LOW (ref 3.5–5.2)
Alkaline Phosphatase: 174 U/L — ABNORMAL HIGH (ref 39–117)
BUN: 24 mg/dL — ABNORMAL HIGH (ref 6–23)
Chloride: 95 mEq/L — ABNORMAL LOW (ref 96–112)
Potassium: 3.7 mEq/L (ref 3.5–5.1)
Total Bilirubin: 0.3 mg/dL (ref 0.3–1.2)

## 2011-08-03 LAB — DIFFERENTIAL
Basophils Relative: 1 % (ref 0–1)
Eosinophils Absolute: 0.2 10*3/uL (ref 0.0–0.7)
Lymphs Abs: 1.3 10*3/uL (ref 0.7–4.0)
Monocytes Absolute: 0.6 10*3/uL (ref 0.1–1.0)
Neutro Abs: 5.1 10*3/uL (ref 1.7–7.7)
Neutrophils Relative %: 70 % (ref 43–77)

## 2011-08-03 LAB — MAGNESIUM: Magnesium: 2.4 mg/dL (ref 1.5–2.5)

## 2011-08-03 MED ORDER — TRACE MINERALS CR-CU-MN-SE-ZN 10-1000-500-60 MCG/ML IV SOLN
INTRAVENOUS | Status: AC
  Administered 2011-08-03: 18:00:00 via INTRAVENOUS
  Filled 2011-08-03: qty 2000

## 2011-08-03 MED ORDER — FAT EMULSION 20 % IV EMUL
250.0000 mL | INTRAVENOUS | Status: AC
  Administered 2011-08-03: 250 mL via INTRAVENOUS
  Filled 2011-08-03: qty 250

## 2011-08-03 NOTE — Progress Notes (Signed)
PARENTERAL NUTRITION CONSULT NOTE - FOLLOW UP  Pharmacy Consult for TNA Indication: Ileus s/p lysis of adhesions, enteroenterostomy 2/23  No Known Allergies  Patient Measurements: Height: 5\' 7"  (170.2 cm) Weight: 114 lb (51.71 kg) IBW/kg (Calculated) : 66.1   Vital Signs: Temp: 97.3 F (36.3 C) (03/11 0500) Temp src: Oral (03/11 0500) BP: 103/73 mmHg (03/11 0500) Pulse Rate: 89  (03/11 0500)  Intake/Output from previous day: 03/10 0701 - 03/11 0700 In: 3280 [P.O.:240; I.V.:954; IV Piggyback:150; TPN:1936] Out: 2505 [Urine:350; Drains:2155] I/O + 0.8L (3/10)   Labs:  [facility] 08/03/11 0439 08/01/11 0320  WBC 7.3 8.7  HGB 12.1* 10.9*  HCT 35.2* 32.6*  PLT 589* 483*  APTT -- --  INR -- --     [facility] 08/03/11 0439 08/01/11 0320  NA 134* 131*  K 3.7 4.1  CL 95* 94*  CO2 31 30  GLUCOSE 112* 114*  BUN 24* 16  CREATININE 0.77 0.68  LABCREA -- --  CREAT24HRUR -- --  CALCIUM 9.5 8.9  MG 2.4 --  PHOS 3.6 --  PROT 6.9 --  ALBUMIN 2.8* --  AST 31 --  ALT 46 --  ALKPHOS 174* --  BILITOT 0.3 --  BILIDIR -- --  IBILI -- --  PREALBUMIN -- --  TRIG 63 --  CHOLHDL -- --  CHOL 108 --   Estimated Creatinine Clearance: 77.2 ml/min (by C-G formula based on Cr of 0.77).    Basename 08/03/11 0549 08/02/11 2150 08/02/11 1404  GLUCAP 126* 117* 129*   Insulin Requirements in the past 24 hours:  2 units Novolog sliding scale coverage   Medications:  Scheduled:     . heparin  5,000 Units Subcutaneous Q8H  . insulin aspart  0-9 Units Subcutaneous Q8H  . lip balm  1 application Topical BID  . piperacillin-tazobactam (ZOSYN)  IV  3.375 g Intravenous Q8H   Infusions:     . dextrose 5 % and 0.9 % NaCl with KCl 20 mEq/L 40 mL/hr (07/31/11 1214)  . TPN (CLINIMIX) +/- additives     And  . fat emulsion    . TPN (CLINIMIX) +/- additives 80 mL/hr at 08/01/11 1749  . TPN (CLINIMIX) +/- additives 80 mL/hr at 08/02/11 1831     Current Nutrition:  TNA (Clinimix E  5/15) at 80 ml/hr (=goal) NPO  IVF: D-5-NS + KCl 68mEq/L at 40 mL/hr  Nutritional Goals:   Per RD assessment 3/4: 1800-2100 kCal/d, 90-105 grams/d, Fluid: 1.8-2.1L  Goal of Clinimix 5/15 at 80 ml/hr and lipid at 10 ml/hr MWF provides 96 g of protein and 1363 Kcal/day on non lipid days, 1843 Kcal/day on lipid days and average of 1569 Kcal/day from [place].   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  Baseline prealbumin 12.0.  Today's prealbumin pending.  Tolerating TPN at goal rate  CBGs within goal <150  Electrolytes acceptable  Plan:   Continue Clinimix E 5/15 at goal rate of 80 ml/hr  Fat emulsion, MVI and trace elements only on MWF due to ongoing shortage  F/U on prealbumin result  Elie Goody, PharmD, BCPS Pager: [phone] 08/03/2011  11:04 AM

## 2011-08-03 NOTE — Progress Notes (Signed)
Patient seen and examined.  Agree with PA's note.  He is slowly improving.

## 2011-08-03 NOTE — Progress Notes (Signed)
6 Days Post-Op  Subjective: Afebrile, VSS, labs OK, 2150 ml thru gastrostomy tube.  5ml thru his drain. This was somewhat green last week but the little fluid i see in drain line is serous.  He is feeling much better and in much better spirits.  Objective: Vital signs in last 24 hours: Temp:  [97.3 F (36.3 C)-98.2 F (36.8 C)] 97.3 F (36.3 C) (03/11 0500) Pulse Rate:  [88-91] 89  (03/11 0500) Resp:  [18] 18  (03/11 0500) BP: (91-108)/(69-73) 103/73 mmHg (03/11 0500) SpO2:  [97 %-99 %] 99 % (03/11 0500) Last BM Date: 08/02/11  Intake/Output from previous day: 03/10 0701 - 03/11 0700 In: 3280 [P.O.:240; I.V.:954; IV Piggyback:150; TPN:1936] Out: 2505 [Urine:350; Drains:2155] Intake/Output this shift:    PE:  Alert, NAD, chest clear AbdL  Open abdomen is clean and looks good, gastrostomy green bilious drainage, + BS, + flatus, + BM on clear liquids.  Lab Results:   [facility] 08/03/11 0439 08/01/11 0320  WBC 7.3 8.7  HGB 12.1* 10.9*  HCT 35.2* 32.6*  PLT 589* 483*    Lab 08/03/11 0439 07/30/11 0335  AST 31 16  ALT 46 26  ALKPHOS 174* 71  BILITOT 0.3 0.4  PROT 6.9 5.4*  ALBUMIN 2.8* 2.1*    BMET  Basename 08/03/11 0439 08/01/11 0320  NA 134* 131*  K 3.7 4.1  CL 95* 94*  CO2 31 30  GLUCOSE 112* 114*  BUN 24* 16  CREATININE 0.77 0.68  CALCIUM 9.5 8.9   PT/INR No results found for this basename: LABPROT:2,INR:2 in the last 72 hours   Studies/Results: No results found.  Anti-infectives: Anti-infectives     Start     Dose/Rate Route Frequency Ordered Stop   07/28/11 2000  piperacillin-tazobactam (ZOSYN) IVPB 3.375 g       3.375 g 12.5 mL/hr over 240 Minutes Intravenous 3 times per day 07/28/11 1914     07/18/11 1135   ertapenem (INVANZ) 1 g in sodium chloride 0.9 % 50 mL IVPB        1 g 100 mL/hr over 30 Minutes Intravenous 60 min pre-op 07/18/11 1135 07/18/11 1235         Current Facility-Administered Medications  Medication Dose Route Frequency  Provider Last Rate Last Dose  . acetaminophen (TYLENOL) suppository 650 mg  650 mg Rectal Q6H PRN Ardeth Sportsman, MD      . bisacodyl (DULCOLAX) suppository 10 mg  10 mg Rectal Q12H PRN Ardeth Sportsman, MD      . chlorproMAZINE (THORAZINE) 25 mg in sodium chloride 0.9 % 25 mL IVPB  25 mg Intravenous Q6H PRN Ardeth Sportsman, MD   25 mg at 08/01/11 2352  . dextrose 5 % and 0.9 % NaCl with KCl 20 mEq/L infusion   Intravenous Continuous Freeman Caldron, PA 40 mL/hr at 07/31/11 1214 40 mL/hr at 07/31/11 1214  . diphenhydrAMINE (BENADRYL) injection 12.5-25 mg  12.5-25 mg Intravenous Q6H PRN Ardeth Sportsman, MD      . guaiFENesin [facility]) 12 hr tablet 600 mg  600 mg Oral BID PRN Atilano Ina, MD,FACS   600 mg at 08/03/11 0627  . heparin injection 5,000 Units  5,000 Units Subcutaneous Q8H Mariella Saa, MD   5,000 Units at 08/03/11 0557  . HYDROmorphone (DILAUDID) injection 1-2 mg  1-2 mg Intravenous Q1H PRN Mariella Saa, MD   1 mg at 08/03/11 0831  . insulin aspart (novoLOG) injection 0-9 Units  0-9 Units  Subcutaneous Q8H Mariella Saa, MD   1 Units at 08/03/11 0557  . lip balm (CARMEX) ointment 1 application  1 application Topical BID Ardeth Sportsman, MD   1 application at 08/01/11 2200  . magic mouthwash  15 mL Oral QID PRN Ardeth Sportsman, MD      . menthol-cetylpyridinium (CEPACOL) lozenge 3 mg  1 lozenge Oral PRN Kandis Cocking, MD   3 mg at 07/20/11 0254  . ondansetron (ZOFRAN) injection 4 mg  4 mg Intravenous Q6H PRN Ardeth Sportsman, MD   4 mg at 07/29/11 1901  . piperacillin-tazobactam (ZOSYN) IVPB 3.375 g  3.375 g Intravenous Q8H Mariella Saa, MD   3.375 g at 08/03/11 0153  . promethazine (PHENERGAN) injection 12.5-25 mg  12.5-25 mg Intravenous Q6H PRN Ardeth Sportsman, MD   12.5 mg at 07/28/11 2300  . sodium chloride 0.9 % injection 10-40 mL  10-40 mL Intracatheter PRN Mariella Saa, MD   10 mL at 08/01/11 0320  . tpn solution (CLINIMIX E 5/15) 2,000 mL  infusion   Intravenous Continuous TPN Rollene Fare, PHARMD 80 mL/hr at 08/01/11 1749    . tpn solution (CLINIMIX E 5/15) 2,000 mL infusion   Intravenous Continuous TPN Rollene Fare, PHARMD 80 mL/hr at 08/02/11 1831      Assessment/Plan SBO s/p ex lap, SBR 07/18/11  Dr. Johna Sheriff S/p repeat ex lap, open gastrostomy, drainage of abscess 07/28/11 Dr. Johna Sheriff Ileus -- Continue NPO for now. Suspect flatus in next 24h given good BS.  ID -- Zosyn D#4 empiric for intraabdominal abscess. JP in place. WBC decreased to normal, afebrile.  Hiccups -- Improved on thorazine.  Hyponatremia -- Decrease IVF, add NaCl tabs.  Hypokalemia -- Improved  FEN -- TPN  VTE -- SQH  Malnutrition Prealbumin 12.0 (3/3)  Plan:  I will discuss clamping gastrostomy tube with Dr. Abbey Chatters and then advance diet as tolerated.  Continue TNA, OT/PT . He is going home with 9 y/0 mother at D/C.   LOS: 17 days    Kayleeann Huxford 08/03/2011

## 2011-08-04 LAB — GLUCOSE, CAPILLARY
Glucose-Capillary: 108 mg/dL — ABNORMAL HIGH (ref 70–99)
Glucose-Capillary: 103 mg/dL — ABNORMAL HIGH (ref 70–99)

## 2011-08-04 MED ORDER — GI COCKTAIL ~~LOC~~
30.0000 mL | Freq: Four times a day (QID) | ORAL | Status: DC | PRN
  Administered 2011-08-04 – 2011-08-05 (×3): 30 mL via ORAL
  Filled 2011-08-04 (×3): qty 30

## 2011-08-04 MED ORDER — CLINIMIX E/DEXTROSE (5/15) 5 % IV SOLN
INTRAVENOUS | Status: AC
  Administered 2011-08-04: 17:00:00 via INTRAVENOUS
  Filled 2011-08-04: qty 2000

## 2011-08-04 MED ORDER — OXYCODONE-ACETAMINOPHEN 5-325 MG PO TABS
1.0000 | ORAL_TABLET | ORAL | Status: DC | PRN
  Administered 2011-08-06 – 2011-08-17 (×3): 1 via ORAL
  Filled 2011-08-04: qty 1
  Filled 2011-08-04: qty 2
  Filled 2011-08-04 (×2): qty 1
  Filled 2011-08-04: qty 2

## 2011-08-04 MED ORDER — POLYETHYLENE GLYCOL 3350 17 G PO PACK
17.0000 g | PACK | Freq: Every day | ORAL | Status: DC
  Administered 2011-08-04 – 2011-08-05 (×2): 17 g via ORAL
  Filled 2011-08-04 (×2): qty 1

## 2011-08-04 NOTE — Progress Notes (Signed)
OT Note:  Pt refused OT before lunch today; did not have a chance to check back.  Will reattempt tomorrow.  [place], OTR/L [phone] 08/04/2011

## 2011-08-04 NOTE — Progress Notes (Signed)
7 Days Post-Op  Subjective: Afebrile, VSS,  No BM recorded, drain: 5ml, gastrostomy: 1400 ml., Labs OK yesterday, complains of pain at Gastrostomy site, but tolerating gastrostomy tube clamping well.  Not walking much yet.  "didn't feel like it."  Objective: Vital signs in last 24 hours: Temp:  [97.2 F (36.2 C)-98.7 F (37.1 C)] 98.7 F (37.1 C) (03/12 0619) Pulse Rate:  [80-90] 80  (03/12 0619) Resp:  [18] 18  (03/12 0619) BP: (92-107)/(63-79) 107/79 mmHg (03/12 0619) SpO2:  [95 %-98 %] 98 % (03/12 0619) Last BM Date: 08/02/11  Intake/Output from previous day: 03/11 0701 - 03/12 0700 In: 680 [P.O.:680] Out: 1955 [Urine:550; Drains:1405] Intake/Output this shift:    Chest: Clear, ABd: soft, +BS, no distension, tender at gastrostomy site.  No BM  Lab Results:   [facility] 08/03/11 0439  WBC 7.3  HGB 12.1*  HCT 35.2*  PLT 589*    BMET  Basename 08/03/11 0439  NA 134*  K 3.7  CL 95*  CO2 31  GLUCOSE 112*  BUN 24*  CREATININE 0.77  CALCIUM 9.5   PT/INR No results found for this basename: LABPROT:2,INR:2 in the last 72 hours   Studies/Results: No results found.  Anti-infectives: Anti-infectives     Start     Dose/Rate Route Frequency Ordered Stop   07/28/11 2000  piperacillin-tazobactam (ZOSYN) IVPB 3.375 g       3.375 g 12.5 mL/hr over 240 Minutes Intravenous 3 times per day 07/28/11 1914     07/18/11 1135   ertapenem (INVANZ) 1 g in sodium chloride 0.9 % 50 mL IVPB        1 g 100 mL/hr over 30 Minutes Intravenous 60 min pre-op 07/18/11 1135 07/18/11 1235         Current Facility-Administered Medications  Medication Dose Route Frequency Provider Last Rate Last Dose  . acetaminophen (TYLENOL) suppository 650 mg  650 mg Rectal Q6H PRN Ardeth Sportsman, MD      . bisacodyl (DULCOLAX) suppository 10 mg  10 mg Rectal Q12H PRN Ardeth Sportsman, MD      . chlorproMAZINE (THORAZINE) 25 mg in sodium chloride 0.9 % 25 mL IVPB  25 mg Intravenous Q6H PRN Ardeth Sportsman, MD   25 mg at 08/01/11 2352  . dextrose 5 % and 0.9 % NaCl with KCl 20 mEq/L infusion   Intravenous Continuous Freeman Caldron, PA 40 mL/hr at 08/04/11 0612    . diphenhydrAMINE (BENADRYL) injection 12.5-25 mg  12.5-25 mg Intravenous Q6H PRN Ardeth Sportsman, MD      . tpn solution (CLINIMIX E 5/15) 2,000 mL with multivitamins adult 10 mL, trace elements Cr-Cu-Mn-Se-Zn 1 mL infusion   Intravenous Continuous TPN Ky Barban Absher, PHARMD 80 mL/hr at 08/03/11 1755     And  . fat emulsion 20 % infusion 250 mL  250 mL Intravenous Continuous TPN Marquon K Absher, PHARMD 10 mL/hr at 08/03/11 1755 250 mL at 08/03/11 1755  . guaiFENesin (MUCINEX) 12 hr tablet 600 mg  600 mg Oral BID PRN Atilano Ina, MD,FACS   600 mg at 08/03/11 1852  . heparin injection 5,000 Units  5,000 Units Subcutaneous Q8H Mariella Saa, MD   5,000 Units at 08/04/11 0603  . HYDROmorphone (DILAUDID) injection 1-2 mg  1-2 mg Intravenous Q1H PRN Mariella Saa, MD   1 mg at 08/04/11 0603  . insulin aspart (novoLOG) injection 0-9 Units  0-9 Units Subcutaneous Q8H Mariella Saa, MD  1 Units at 08/03/11 2225  . lip balm (CARMEX) ointment 1 application  1 application Topical BID Ardeth Sportsman, MD   1 application at 08/03/11 2200  . magic mouthwash  15 mL Oral QID PRN Ardeth Sportsman, MD      . menthol-cetylpyridinium (CEPACOL) lozenge 3 mg  1 lozenge Oral PRN Kandis Cocking, MD   3 mg at 07/20/11 0254  . ondansetron (ZOFRAN) injection 4 mg  4 mg Intravenous Q6H PRN Ardeth Sportsman, MD   4 mg at 07/29/11 1901  . piperacillin-tazobactam (ZOSYN) IVPB 3.375 g  3.375 g Intravenous Q8H Mariella Saa, MD   3.375 g at 08/04/11 0118  . promethazine (PHENERGAN) injection 12.5-25 mg  12.5-25 mg Intravenous Q6H PRN Ardeth Sportsman, MD   12.5 mg at 07/28/11 2300  . sodium chloride 0.9 % injection 10-40 mL  10-40 mL Intracatheter PRN Mariella Saa, MD   10 mL at 08/01/11 0320  . tpn solution (CLINIMIX E 5/15) 2,000  mL infusion   Intravenous Continuous TPN Rollene Fare, PHARMD 80 mL/hr at 08/02/11 1831    . tpn solution (CLINIMIX E 5/15) 2,000 mL infusion   Intravenous Continuous TPN Annia Belt, PHARMD        Assessment/Plan SBO s/p ex lap, SBR 07/18/11 Dr. Johna Sheriff  S/p repeat ex lap, open gastrostomy, drainage of abscess 07/28/11 Dr. Johna Sheriff  Ileus -- Continue NPO for now. Suspect flatus in next 24h given good BS.  ID -- Zosyn D#4 empiric for intraabdominal abscess. JP in place. WBC decreased to normal, afebrile.  Hiccups -- Improved on thorazine.  Hyponatremia -- Decrease IVF, add NaCl tabs.  Hypokalemia -- Improved  FEN -- TPN  VTE -- SQH  Malnutrition Prealbumin 12.0 (3/3)  Plan:  Advance diet. Add miralax and PO pain meds, mobilize.  He has PT/OT orders already.      LOS: 18 days    Ronda Kazmi 08/04/2011

## 2011-08-04 NOTE — Progress Notes (Signed)
PARENTERAL NUTRITION CONSULT NOTE - FOLLOW UP  Pharmacy Consult for TNA Indication: Ileus s/p lysis of adhesions, enteroenterostomy 2/23  No Known Allergies  Patient Measurements: Height: 5\' 7"  (170.2 cm) Weight: 114 lb (51.71 kg) IBW/kg (Calculated) : 66.1   Vital Signs: Temp: 98.7 F (37.1 C) (03/12 0619) Temp src: Oral (03/12 0619) BP: 107/79 mmHg (03/12 0619) Pulse Rate: 80  (03/12 0619)  Intake/Output from previous day: 03/11 0701 - 03/12 0700 In: 680 [P.O.:680] Out: 1955 [Urine:550; Drains:1405]    Labs:  [facility] 08/03/11 0439  WBC 7.3  HGB 12.1*  HCT 35.2*  PLT 589*  APTT --  INR --     Basename 08/03/11 0439  NA 134*  K 3.7  CL 95*  CO2 31  GLUCOSE 112*  BUN 24*  CREATININE 0.77  LABCREA --  CREAT24HRUR --  CALCIUM 9.5  MG 2.4  PHOS 3.6  PROT 6.9  ALBUMIN 2.8*  AST 31  ALT 46  ALKPHOS 174*  BILITOT 0.3  BILIDIR --  IBILI --  PREALBUMIN 22.0  TRIG 63  CHOLHDL --  CHOL 108   Estimated Creatinine Clearance: 77.2 ml/min (by C-G formula based on Cr of 0.77).    Basename 08/03/11 2151 08/03/11 1434 08/03/11 0549  GLUCAP 125* 104* 126*   Insulin Requirements in the past 24 hours:  2 units Novolog sliding scale coverage   Medications:  Scheduled:     . heparin  5,000 Units Subcutaneous Q8H  . insulin aspart  0-9 Units Subcutaneous Q8H  . lip balm  1 application Topical BID  . piperacillin-tazobactam (ZOSYN)  IV  3.375 g Intravenous Q8H   Infusions:     . dextrose 5 % and 0.9 % NaCl with KCl 20 mEq/L 40 mL/hr at 08/04/11 0612  . TPN (CLINIMIX) +/- additives 80 mL/hr at 08/03/11 1755   And  . fat emulsion 250 mL (08/03/11 1755)  . TPN (CLINIMIX) +/- additives 80 mL/hr at 08/02/11 1831     Current Nutrition:  TNA (Clinimix E 5/15) at 80 ml/hr (=goal) CLD as of 3/10am  IVF: D-5-NS + KCl 47mEq/L at 40 mL/hr  Nutritional Goals:   Per RD assessment 3/4: 1800-2100 kCal/d, 90-105 grams/d, Fluid: 1.8-2.1L  Goal of  Clinimix 5/15 at 80 ml/hr and lipid at 10 ml/hr MWF provides 96 g of protein and 1363 Kcal/day on non lipid days, 1843 Kcal/day on lipid days and average of 1569 Kcal/day from [place].   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  Baseline prealbumin 12.0.  3/11 albumin = 2.8.  Tolerating TPN at goal rate  Diet advanced to Clear Liquids on 3/10. 680 ml PO intake recorded in last 24 hours.  CBGs within goal <150. Has SSI q8h available.  Electrolytes acceptable, Na and Cl improving.  IVF = D5NS+20K @ 59ml/hr  Plan:   Continue Clinimix E 5/15 at goal rate of 80 ml/hr  Fat emulsion, MVI and trace elements only on MWF due to ongoing shortage  Await orders to begin weaning and subsequently d/cTNA  Darrol Angel, PharmD Pager: [phone] 08/04/2011  9:06 AM

## 2011-08-04 NOTE — Progress Notes (Signed)
Patient seen and examined.  Agree with PA's note.  

## 2011-08-04 NOTE — Progress Notes (Signed)
He was seen and examined.  Wound healing in well by secondary intention.  Advancing to full liquid diet today.

## 2011-08-04 NOTE — Progress Notes (Signed)
PT Cancellation Note  Treatment cancelled today due to patient's refusal to participate. Pt stated he has been able to walk in his room and that he will work with PT "later".  He stated he understands importance of mobility. Will follow.  Ralene Bathe Kistler 08/04/2011, 10:47 AM [phone]

## 2011-08-05 ENCOUNTER — Inpatient Hospital Stay (HOSPITAL_COMMUNITY): Payer: Medicaid Other

## 2011-08-05 LAB — GLUCOSE, CAPILLARY
Glucose-Capillary: 107 mg/dL — ABNORMAL HIGH (ref 70–99)
Glucose-Capillary: 134 mg/dL — ABNORMAL HIGH (ref 70–99)

## 2011-08-05 MED ORDER — BISACODYL 10 MG RE SUPP
10.0000 mg | Freq: Once | RECTAL | Status: AC
  Administered 2011-08-05: 10 mg via RECTAL
  Filled 2011-08-05: qty 1

## 2011-08-05 MED ORDER — FAT EMULSION 20 % IV EMUL
250.0000 mL | INTRAVENOUS | Status: AC
  Administered 2011-08-05: 250 mL via INTRAVENOUS
  Filled 2011-08-05: qty 250

## 2011-08-05 MED ORDER — TRACE MINERALS CR-CU-MN-SE-ZN 10-1000-500-60 MCG/ML IV SOLN
INTRAVENOUS | Status: AC
  Administered 2011-08-05: 18:00:00 via INTRAVENOUS
  Filled 2011-08-05: qty 2000

## 2011-08-05 NOTE — Progress Notes (Signed)
PT Screen Note  Treatment cancelled today due to patient's refusal to participate.  Patient standing in room and reports walking hallway without difficulty, getting in/out of bed without difficulty.  Encouraged continued ambulation and to ask MD if any PT needs arise or equipment needed at discharge.  Will sign off at this time per patient request. Sheran Lawless, PT [phone] 08/05/2011, 4:32 PM

## 2011-08-05 NOTE — Progress Notes (Signed)
Patient seen.  Will stop the Miralax and keep on current diet.

## 2011-08-05 NOTE — Evaluation (Signed)
Occupational Therapy Evaluation Patient Details Name: [name] MRN: [id number] DOB: 1955/11/08 Today's Date: 08/05/2011  Problem List:  Patient Active Problem List  Diagnoses  . ANXIETY  . PSYCHIATRIC DISORDER  . DEPRESSION  . PUD  . GI BLEEDING  . ARTHRITIS  . SCOLIOSIS  . IMPERFORATE ANUS  . FECAL INCONTINENCE  . INGUINAL HERNIA, HX OF  . FATIGUE  . DYSPNEA  . Obstipation  . Nausea  . Ileus following gastrointestinal surgery  . SBO (small bowel obstruction)    Past Medical History:  Past Medical History  Diagnosis Date  . H/O: GI bleed   . Chronic neck pain   . Idiopathic scoliosis   . Colonic inertia     with chronic lifelong costipation  . Blood transfusion    Past Surgical History:  Past Surgical History  Procedure Date  . Left knee surgery 1994, 1992  . Hernia repair 1980    left inguinal hernia  . Multiple cervical fusions   . Surgery for imperforate anus 1958  . Upper gastrointestinal endoscopy 05/15/03  . Colonoscopy 11/08/2006  . Laparotomy 07/18/2011    Procedure: EXPLORATORY LAPAROTOMY;  Surgeon: Mariella Saa, MD;  Location: [facility] ORS;  Service: General;  Laterality: N/A;  lysis of adhesions entero enterostomy    OT Assessment/Plan/Recommendation OT Assessment Clinical Impression Statement: Pt is a 56 yo male who presents with SBO. Pt is adamantly declining any further OT services. Will sign off. Pt currently functioning at supervision/mod I level for all ADLs. OT Recommendation/Assessment: Patient does not need any further OT services OT Recommendation Follow Up Recommendations: No OT follow up Equipment Recommended: None recommended by OT OT Goals    OT Evaluation Precautions/Restrictions  Restrictions Weight Bearing Restrictions: No Prior Functioning Home Living Lives With: Alone;Other (Comment) (will be staying with his mom.) Receives Help From: Family Type of Home: House Home Layout: One level Home Access: Stairs to  enter Entrance Stairs-Rails: Right Entrance Stairs-Number of Steps: 2 Bathroom Shower/Tub: Engineer, manufacturing systems: Standard Home Adaptive Equipment: None Prior Function Level of Independence: Independent with basic ADLs;Independent with transfers;Independent with gait;Independent with homemaking with ambulation Driving: Yes Vocation: Unemployed ADL ADL Grooming: Simulated;Supervision/safety Where Assessed - Grooming: Standing at sink Upper Body Bathing: Simulated;Supervision/safety Where Assessed - Upper Body Bathing: Standing at sink Lower Body Bathing: Simulated;Supervision/safety Where Assessed - Lower Body Bathing: Sit to stand from bed Upper Body Dressing: Simulated;Supervision/safety Where Assessed - Upper Body Dressing: Standing Lower Body Dressing: Simulated;Supervision/safety Where Assessed - Lower Body Dressing: Sit to stand from bed Toilet Transfer: Performed;Modified independent Toilet Transfer Method: Proofreader: Regular height toilet Toileting - Clothing Manipulation: Simulated;Modified independent Where Assessed - Toileting Clothing Manipulation: Sit to stand from 3-in-1 or toilet Where Assessed - Toileting Hygiene: Sit to stand from 3-in-1 or toilet Tub/Shower Transfer: Simulated;Not assessed ADL Comments: Pt adamantly declined the need for any further OT or any equip. Vision/Perception    Cognition Cognition Arousal/Alertness: Awake/alert Overall Cognitive Status: Appears within functional limits for tasks assessed Orientation Level: Oriented X4 Sensation/Coordination   Extremity Assessment RUE Assessment RUE Assessment: Within Functional Limits LUE Assessment LUE Assessment: Within Functional Limits Mobility  Bed Mobility Bed Mobility: Yes Supine to Sit: 6: Modified independent (Device/Increase time) Transfers Transfers: Yes Sit to Stand: 5: Supervision;With upper extremity assist;From bed;From toilet Stand to  Sit: 5: Supervision;With upper extremity assist;To chair/3-in-1;To toilet Exercises   End of Session OT - End of Session Activity Tolerance: Patient tolerated treatment well Patient left: in chair;with  call bell in reach General Behavior During Session: [facility] for tasks performed Cognition: [facility] for tasks performed   Jordon Bourquin A OTR/L [phone] 08/05/2011, 9:13 AM

## 2011-08-05 NOTE — Progress Notes (Signed)
Patient discussed at the Long Length of Stay [name] Weeks 08/05/2011  

## 2011-08-05 NOTE — Progress Notes (Signed)
8 Days Post-Op  Subjective: Afebrile, VSS, no stool recorded No labs Not as happy today, complaining of pain Lside, he's pointing to area of his gastrostomy and drain site. 5ml recorded yesterday. NO BM on full liquids Objective: Vital signs in last 24 hours: Temp:  [97.2 F (36.2 C)-98.4 F (36.9 C)] 98.4 F (36.9 C) (03/13 0610) Pulse Rate:  [77-89] 77  (03/13 0610) Resp:  [16-18] 16  (03/13 0610) BP: (101-120)/(64-78) 103/67 mmHg (03/13 0610) SpO2:  [95 %-99 %] 99 % (03/13 0610) Last BM Date: 08/02/11  Intake/Output from previous day: 03/12 0701 - 03/13 0700 In: 1730 [P.O.:240; I.V.:480; IV Piggyback:50; TPN:960] Out: 955 [Urine:950; Drains:5] Intake/Output this shift:    PE: alert up in chair, just refused PT or OT says he doesn't need it.  Chest Clear, bases decreased some. Abd.  Incision open and looks good.  +BS, not distended, Gastrostomy plugged.    Lab Results:   [facility] 08/03/11 0439  WBC 7.3  HGB 12.1*  HCT 35.2*  PLT 589*    BMET  Basename 08/03/11 0439  NA 134*  K 3.7  CL 95*  CO2 31  GLUCOSE 112*  BUN 24*  CREATININE 0.77  CALCIUM 9.5   PT/INR No results found for this basename: LABPROT:2,INR:2 in the last 72 hours   Studies/Results: No results found.  Anti-infectives: Anti-infectives     Start     Dose/Rate Route Frequency Ordered Stop   07/28/11 2000  piperacillin-tazobactam (ZOSYN) IVPB 3.375 g       3.375 g 12.5 mL/hr over 240 Minutes Intravenous 3 times per day 07/28/11 1914     07/18/11 1135   ertapenem (INVANZ) 1 g in sodium chloride 0.9 % 50 mL IVPB        1 g 100 mL/hr over 30 Minutes Intravenous 60 min pre-op 07/18/11 1135 07/18/11 1235         Current Facility-Administered Medications  Medication Dose Route Frequency Provider Last Rate Last Dose  . acetaminophen (TYLENOL) suppository 650 mg  650 mg Rectal Q6H PRN Ardeth Sportsman, MD      . bisacodyl (DULCOLAX) suppository 10 mg  10 mg Rectal Q12H PRN Ardeth Sportsman,  MD      . chlorproMAZINE (THORAZINE) 25 mg in sodium chloride 0.9 % 25 mL IVPB  25 mg Intravenous Q6H PRN Ardeth Sportsman, MD   25 mg at 08/01/11 2352  . dextrose 5 % and 0.9 % NaCl with KCl 20 mEq/L infusion   Intravenous Continuous Freeman Caldron, PA 40 mL/hr at 08/05/11 0600    . diphenhydrAMINE (BENADRYL) injection 12.5-25 mg  12.5-25 mg Intravenous Q6H PRN Ardeth Sportsman, MD      . tpn solution (CLINIMIX E 5/15) 2,000 mL with multivitamins adult 10 mL, trace elements Cr-Cu-Mn-Se-Zn 1 mL infusion   Intravenous Continuous TPN Ky Barban Absher, PHARMD 80 mL/hr at 08/03/11 1755     And  . fat emulsion 20 % infusion 250 mL  250 mL Intravenous Continuous TPN Camry K Absher, PHARMD 10 mL/hr at 08/03/11 1755 250 mL at 08/03/11 1755  . gi cocktail (Maalox,Lidocaine,Donnatal)  30 mL Oral Q6H PRN Ardeth Sportsman, MD   30 mL at 08/04/11 2353  . guaiFENesin (MUCINEX) 12 hr tablet 600 mg  600 mg Oral BID PRN Atilano Ina, MD,FACS   600 mg at 08/03/11 1852  . heparin injection 5,000 Units  5,000 Units Subcutaneous Q8H Mariella Saa, MD   5,000 Units at  08/05/11 0544  . HYDROmorphone (DILAUDID) injection 1-2 mg  1-2 mg Intravenous Q1H PRN Mariella Saa, MD   1 mg at 08/05/11 0732  . insulin aspart (novoLOG) injection 0-9 Units  0-9 Units Subcutaneous Q8H Mariella Saa, MD   1 Units at 08/03/11 2225  . lip balm (CARMEX) ointment 1 application  1 application Topical BID Ardeth Sportsman, MD   1 application at 08/04/11 1019  . magic mouthwash  15 mL Oral QID PRN Ardeth Sportsman, MD      . menthol-cetylpyridinium (CEPACOL) lozenge 3 mg  1 lozenge Oral PRN Kandis Cocking, MD   3 mg at 07/20/11 0254  . ondansetron (ZOFRAN) injection 4 mg  4 mg Intravenous Q6H PRN Ardeth Sportsman, MD   4 mg at 08/04/11 1726  . oxyCODONE-acetaminophen (PERCOCET) 5-325 MG per tablet 1-2 tablet  1-2 tablet Oral Q4H PRN Sherrie George, PA      . piperacillin-tazobactam (ZOSYN) IVPB 3.375 g  3.375 g Intravenous  Q8H Mariella Saa, MD   3.375 g at 08/05/11 0230  . polyethylene glycol (MIRALAX / GLYCOLAX) packet 17 g  17 g Oral Daily Sherrie George, [place]   17 g at 08/04/11 1136  . promethazine (PHENERGAN) injection 12.5-25 mg  12.5-25 mg Intravenous Q6H PRN Ardeth Sportsman, MD   12.5 mg at 07/28/11 2300  . sodium chloride 0.9 % injection 10-40 mL  10-40 mL Intracatheter PRN Mariella Saa, MD   10 mL at 08/01/11 0320  . tpn solution (CLINIMIX E 5/15) 2,000 mL infusion   Intravenous Continuous TPN Annia Belt, PHARMD 80 mL/hr at 08/04/11 1715      Assessment/Plan SBO s/p ex lap, SBR 07/18/11 Dr. Johna Sheriff  S/p repeat ex lap, open gastrostomy, drainage of abscess 07/28/11 Dr. Johna Sheriff  Ileus -- Continue NPO for now. Suspect flatus in next 24h given good BS.  ID -- Zosyn D#4 empiric for intraabdominal abscess. JP in place. WBC decreased to normal, afebrile.  Hiccups -- Improved on thorazine.  Hyponatremia -- Decrease IVF, add NaCl tabs.  Hypokalemia -- Improved  FEN -- TPN  VTE -- SQH  Malnutrition Prealbumin 12.0 (3/3)   Plan:  Check labs in AM, cxr, dulcolax supp, stick with full liquids for now, he is on Miralax , TNA   LOS: 19 days    Leigha Olberding 08/05/2011

## 2011-08-05 NOTE — Progress Notes (Signed)
PARENTERAL NUTRITION CONSULT NOTE - FOLLOW UP  Pharmacy Consult for TNA Indication: Ileus s/p lysis of adhesions, enteroenterostomy 2/23  No Known Allergies  Patient Measurements: Height: 5\' 7"  (170.2 cm) Weight: 114 lb (51.71 kg) IBW/kg (Calculated) : 66.1   Vital Signs: Temp: 98.4 F (36.9 C) (03/13 0610) Temp src: Oral (03/13 0610) BP: 103/67 mmHg (03/13 0610) Pulse Rate: 77  (03/13 0610)  Intake/Output from previous day: 03/12 0701 - 03/13 0700 In: 1730 [P.O.:240; I.V.:480; IV Piggyback:50; TPN:960] Out: 955 [Urine:950; Drains:5]    Labs:  [facility] 08/03/11 0439  WBC 7.3  HGB 12.1*  HCT 35.2*  PLT 589*  APTT --  INR --     Basename 08/03/11 0439  NA 134*  K 3.7  CL 95*  CO2 31  GLUCOSE 112*  BUN 24*  CREATININE 0.77  LABCREA --  CREAT24HRUR --  CALCIUM 9.5  MG 2.4  PHOS 3.6  PROT 6.9  ALBUMIN 2.8*  AST 31  ALT 46  ALKPHOS 174*  BILITOT 0.3  BILIDIR --  IBILI --  PREALBUMIN 22.0  TRIG 63  CHOLHDL --  CHOL 108   Estimated Creatinine Clearance: 77.2 ml/min (by C-G formula based on Cr of 0.77).    Basename 08/04/11 2138 08/04/11 0552 08/03/11 2151  GLUCAP 103* 108* 125*   Insulin Requirements in the past 24 hours:  none   Medications:  Scheduled:     . bisacodyl  10 mg Rectal Once  . heparin  5,000 Units Subcutaneous Q8H  . insulin aspart  0-9 Units Subcutaneous Q8H  . lip balm  1 application Topical BID  . piperacillin-tazobactam (ZOSYN)  IV  3.375 g Intravenous Q8H  . polyethylene glycol  17 g Oral Daily   Infusions:     . dextrose 5 % and 0.9 % NaCl with KCl 20 mEq/L 40 mL/hr at 08/05/11 0600  . TPN (CLINIMIX) +/- additives 80 mL/hr at 08/03/11 1755   And  . fat emulsion 250 mL (08/03/11 1755)  . TPN (CLINIMIX) +/- additives 80 mL/hr at 08/04/11 1715     Current Nutrition:  TNA (Clinimix E 5/15) at 80 ml/hr (=goal) FL diet  IVF: D-5-NS + KCl 7mEq/L at 40 mL/hr  Nutritional Goals:   Per RD assessment 3/4:  1800-2100 kCal/d, 90-105 grams/d, Fluid: 1.8-2.1L  Goal of Clinimix 5/15 at 80 ml/hr and lipid at 10 ml/hr MWF provides 96 g of protein and 1363 Kcal/day on non lipid days, 1843 Kcal/day on lipid days and average of 1569 Kcal/day from [place].   Assessment:  56 yo male w/hx imperforate anus, repaired at 1 day old; chronic constipation  Admitted with partial SBO and had exploratory laparotomy with small bowel bypass on 2/23, prolonged post-op ileus now resolving.  POD# 8 s/p exploratory laparotomy with drainage of intra-abdominal abscess and placement of gastrostomy tube   TNA started 3/4, advanced to goal rate on 3/6, tolerating well.  Electrolytes acceptable, CBGs good.  Prealbumin improved from 12 at baseline to 22 on 3/11.  On FL diet, awaiting adequate tolerance and advancement as appropriate.  Plan:   Continue Clinimix E 5/15 at goal rate of 80 ml/hr  Fat emulsion, MVI and trace elements only on MWF due to ongoing shortage  Await orders to begin weaning and subsequently d/cTNA when tolerating POs adequately.  Elie Goody, PharmD, BCPS Pager: [phone] 08/05/2011  9:18 AM

## 2011-08-06 LAB — COMPREHENSIVE METABOLIC PANEL
AST: 16 U/L (ref 0–37)
Albumin: 2.7 g/dL — ABNORMAL LOW (ref 3.5–5.2)
Alkaline Phosphatase: 103 U/L (ref 39–117)
CO2: 28 mEq/L (ref 19–32)
Chloride: 97 mEq/L (ref 96–112)
Creatinine, Ser: 0.76 mg/dL (ref 0.50–1.35)
GFR calc non Af Amer: >90 mL/min (ref >90)
Potassium: 4 mEq/L (ref 3.5–5.1)
Total Bilirubin: 0.2 mg/dL — ABNORMAL LOW (ref 0.3–1.2)

## 2011-08-06 LAB — CBC
HCT: 32.6 % — ABNORMAL LOW (ref 39.0–52.0)
Hemoglobin: 10.9 g/dL — ABNORMAL LOW (ref 13.0–17.0)
MCH: 28.2 pg (ref 26.0–34.0)
MCHC: 33.4 g/dL (ref 30.0–36.0)
MCV: 84.2 fL (ref 78.0–100.0)
RDW: 14.4 % (ref 11.5–15.5)

## 2011-08-06 LAB — GLUCOSE, CAPILLARY: Glucose-Capillary: 122 mg/dL — ABNORMAL HIGH (ref 70–99)

## 2011-08-06 MED ORDER — CLINIMIX E/DEXTROSE (5/15) 5 % IV SOLN
INTRAVENOUS | Status: AC
  Administered 2011-08-06: 17:00:00 via INTRAVENOUS
  Filled 2011-08-06: qty 2000

## 2011-08-06 NOTE — Progress Notes (Signed)
9 Days Post-Op  Subjective: Afebrile, VSS, I/O= ?, +BM after dulcolax, Labs OK, albumin still low, CXR OK,   Feels terrible, abdomen hurts, he's bloated taking maalox and drinking laxative tea. Objective: Vital signs in last 24 hours: Temp:  [98 F (36.7 C)-98.7 F (37.1 C)] 98 F (36.7 C) (03/14 0539) Pulse Rate:  [87-99] 99  (03/14 0539) Resp:  [16-18] 16  (03/14 0539) BP: (96-105)/(68-73) 105/73 mmHg (03/14 0539) SpO2:  [98 %-100 %] 98 % (03/14 0539) Last BM Date: 08/02/11  Intake/Output from previous day: 03/13 0701 - 03/14 0700 In: 260 [P.O.:260] Out: 860 [Urine:600; Drains:260] Intake/Output this shift:    PE:  Alert, really unhappy, feels bad.  Chest, BS clear, ? Down some in L base.  Abd:  C/o pain and gas, not really distended, some bowel sounds,  Incision is clean some brownish granulation tissue mid abdomen, but the open wound looks really good.  Drainage from JP is clear He seems very depressed and concerned he's not progressing. Lab Results:   [facility] 08/06/11 0605  WBC 5.8  HGB 10.9*  HCT 32.6*  PLT 497*    Lab 08/06/11 0605 08/03/11 0439  AST 16 31  ALT 35 46  ALKPHOS 103 174*  BILITOT 0.2* 0.3  PROT 6.0 6.9  ALBUMIN 2.7* 2.8*    BMET  Basename 08/06/11 0605  NA 132*  K 4.0  CL 97  CO2 28  GLUCOSE 101*  BUN 15  CREATININE 0.76  CALCIUM 8.9   PT/INR No results found for this basename: LABPROT:2,INR:2 in the last 72 hours   Studies/Results: Dg Chest 2 View  08/05/2011  *RADIOLOGY REPORT*  Clinical Data: Postop  CHEST - 2 VIEW  Comparison: 07/28/2011  Findings: Cardiomediastinal silhouette is stable.  Mild thoracic levoscoliosis again noted.  No acute infiltrate or pleural effusion.  No pulmonary edema.  Right arm PICC line with tip in upper SVC.  IMPRESSION: No active disease.  Mild thoracic dextroscoliosis.  Original Report Authenticated By: Natasha Mead, M.D.    Anti-infectives: Anti-infectives     Start     Dose/Rate Route Frequency  Ordered Stop   07/28/11 2000  piperacillin-tazobactam (ZOSYN) IVPB 3.375 g       3.375 g 12.5 mL/hr over 240 Minutes Intravenous 3 times per day 07/28/11 1914     07/18/11 1135   ertapenem (INVANZ) 1 g in sodium chloride 0.9 % 50 mL IVPB        1 g 100 mL/hr over 30 Minutes Intravenous 60 min pre-op 07/18/11 1135 07/18/11 1235         Current Facility-Administered Medications  Medication Dose Route Frequency Provider Last Rate Last Dose  . acetaminophen (TYLENOL) suppository 650 mg  650 mg Rectal Q6H PRN Ardeth Sportsman, MD      . bisacodyl (DULCOLAX) suppository 10 mg  10 mg Rectal Q12H PRN Ardeth Sportsman, MD      . bisacodyl (DULCOLAX) suppository 10 mg  10 mg Rectal Once Sherrie George, PA   10 mg at 08/05/11 0929  . chlorproMAZINE (THORAZINE) 25 mg in sodium chloride 0.9 % 25 mL IVPB  25 mg Intravenous Q6H PRN Ardeth Sportsman, MD   25 mg at 08/06/11 0054  . dextrose 5 % and 0.9 % NaCl with KCl 20 mEq/L infusion   Intravenous Continuous Freeman Caldron, PA 40 mL/hr at 08/05/11 2238    . diphenhydrAMINE (BENADRYL) injection 12.5-25 mg  12.5-25 mg Intravenous Q6H PRN Ardeth Sportsman, MD      .  tpn solution (CLINIMIX E 5/15) 2,000 mL with multivitamins adult 10 mL, trace elements Cr-Cu-Mn-Se-Zn 1 mL infusion   Intravenous Continuous TPN Ky Barban Absher, PHARMD 80 mL/hr at 08/05/11 1733     And  . fat emulsion 20 % infusion 250 mL  250 mL Intravenous Continuous TPN Hallis K Absher, PHARMD 10 mL/hr at 08/05/11 1732 250 mL at 08/05/11 1732  . gi cocktail (Maalox,Lidocaine,Donnatal)  30 mL Oral Q6H PRN Ardeth Sportsman, MD   30 mL at 08/05/11 2251  . guaiFENesin (MUCINEX) 12 hr tablet 600 mg  600 mg Oral BID PRN Atilano Ina, MD,FACS   600 mg at 08/03/11 1852  . heparin injection 5,000 Units  5,000 Units Subcutaneous Q8H Mariella Saa, MD   5,000 Units at 08/06/11 0535  . HYDROmorphone (DILAUDID) injection 1-2 mg  1-2 mg Intravenous Q1H PRN Mariella Saa, MD   1 mg at  08/06/11 0552  . insulin aspart (novoLOG) injection 0-9 Units  0-9 Units Subcutaneous Q8H Mariella Saa, MD   1 Units at 08/06/11 0534  . lip balm (CARMEX) ointment 1 application  1 application Topical BID Ardeth Sportsman, MD   1 application at 08/05/11 2200  . magic mouthwash  15 mL Oral QID PRN Ardeth Sportsman, MD      . menthol-cetylpyridinium (CEPACOL) lozenge 3 mg  1 lozenge Oral PRN Kandis Cocking, MD   3 mg at 07/20/11 0254  . ondansetron (ZOFRAN) injection 4 mg  4 mg Intravenous Q6H PRN Ardeth Sportsman, MD   4 mg at 08/04/11 1726  . oxyCODONE-acetaminophen (PERCOCET) 5-325 MG per tablet 1-2 tablet  1-2 tablet Oral Q4H PRN Sherrie George, PA      . piperacillin-tazobactam (ZOSYN) IVPB 3.375 g  3.375 g Intravenous Q8H Mariella Saa, MD   3.375 g at 08/06/11 0200  . promethazine (PHENERGAN) injection 12.5-25 mg  12.5-25 mg Intravenous Q6H PRN Ardeth Sportsman, MD   12.5 mg at 07/28/11 2300  . sodium chloride 0.9 % injection 10-40 mL  10-40 mL Intracatheter PRN Mariella Saa, MD   10 mL at 08/01/11 0320  . tpn solution (CLINIMIX E 5/15) 2,000 mL infusion   Intravenous Continuous TPN Annia Belt, PHARMD 80 mL/hr at 08/04/11 1715    . DISCONTD: polyethylene glycol (MIRALAX / GLYCOLAX) packet 17 g  17 g Oral Daily Sherrie George, [place]   17 g at 08/05/11 9604    Assessment/Plan SBO s/p ex lap, SBR 07/18/11 Dr. Johna Sheriff  S/p repeat ex lap, open gastrostomy, drainage of abscess 07/28/11 Dr. Johna Sheriff  Ileus -- Continue NPO for now. Suspect flatus in next 24h given good BS.  ID -- Zosyn D#4 empiric for intraabdominal abscess. JP in place. WBC decreased to normal, afebrile.  Hiccups -- Improved on thorazine.  Hyponatremia -- Decrease IVF, add NaCl tabs.  Hypokalemia -- Improved  FEN -- TPN  VTE -- SQH  Malnutrition Prealbumin 12.0 (3/3)  Plan:  I place his gastrostomy back on gravity drain, I am going back to clears liquids, and see if that helps,  He's not had anything  for pain since last PM.      LOS: 20 days    Zaliyah Meikle 08/06/2011   08/06/2011 8:02 AM

## 2011-08-06 NOTE — Progress Notes (Signed)
PARENTERAL NUTRITION CONSULT NOTE - FOLLOW UP  Pharmacy Consult for TNA Indication: Postoperative Ileus - s/p lysis of adhesions, enteroenterostomy 2/23; s/p exploratory laparotomy with drainage of intra-abdominal abscess and gastrostomy tube placement 3/5.  No Known Allergies  Patient Measurements: Height: 5\' 7"  (170.2 cm) Weight: 114 lb (51.71 kg) IBW/kg (Calculated) : 66.1   Vital Signs: Temp: 98 F (36.7 C) (03/14 0539) Temp src: Oral (03/14 0539) BP: 105/73 mmHg (03/14 0539) Pulse Rate: 99  (03/14 0539)  Intake/Output from previous day: 03/13 0701 - 03/14 0700 In: 260 [P.O.:260] Out: 860 [Urine:600; Drains:260]    Labs:  [facility] 08/06/11 0605  WBC 5.8  HGB 10.9*  HCT 32.6*  PLT 497*  APTT --  INR --     Basename 08/06/11 0605  NA 132*  K 4.0  CL 97  CO2 28  GLUCOSE 101*  BUN 15  CREATININE 0.76  LABCREA --  CREAT24HRUR --  CALCIUM 8.9  MG 2.3  PHOS 3.2  PROT 6.0  ALBUMIN 2.7*  AST 16  ALT 35  ALKPHOS 103  BILITOT 0.2*  BILIDIR --  IBILI --  PREALBUMIN --  TRIG --  CHOLHDL --  CHOL --   Estimated Creatinine Clearance: 77.2 ml/min (by C-G formula based on Cr of 0.76).    Basename 08/06/11 0533 08/05/11 2157 08/05/11 1423  GLUCAP 122* 126* 134*   Insulin Requirements in the past 24 hours:  3 units Novolog sliding scale coverage   Medications:  Scheduled:     . bisacodyl  10 mg Rectal Once  . heparin  5,000 Units Subcutaneous Q8H  . insulin aspart  0-9 Units Subcutaneous Q8H  . lip balm  1 application Topical BID  . piperacillin-tazobactam (ZOSYN)  IV  3.375 g Intravenous Q8H  . DISCONTD: polyethylene glycol  17 g Oral Daily   Infusions:     . dextrose 5 % and 0.9 % NaCl with KCl 20 mEq/L 40 mL/hr at 08/05/11 2238  . TPN (CLINIMIX) +/- additives 80 mL/hr at 08/05/11 1733   And  . fat emulsion 250 mL (08/05/11 1732)  . TPN (CLINIMIX) +/- additives 80 mL/hr at 08/04/11 1715     Current Nutrition:  TNA (Clinimix E 5/15)  at 80 ml/hr (=goal rate) Lipids 20% at 68mL/hr on MWF CL diet  IVF: D-5-NS + KCl 26mEq/L at 40 mL/hr  Nutritional Goals:   Per RD assessment 3/4: 1800-2100 kCal/d, 90-105 grams/d, Fluid: 1.8-2.1L  Goal of Clinimix 5/15 at 80 ml/hr and lipid at 10 ml/hr MWF provides 96 g of protein and 1363 Kcal/day on non lipid days, 1843 Kcal/day on lipid days and average of 1569 Kcal/day from [place].   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   POD# 8 s/p exploratory laparotomy with drainage of intra-abdominal abscess and placement of gastrostomy tube on 3/5.  TNA started 3/4, advanced to goal rate on 3/6, tolerating well.  Prealbumin improved from 12 at baseline to 22 on 3/11.  Electrolytes acceptable.    CBGs well-controlled (<150).  Intolerant of full liquids (abdominal pain and bloating).  Plan:   Continue Clinimix E 5/15 at goal rate of 80 ml/hr  Fat emulsion, MVI and trace elements only on MWF due to ongoing shortage  Change in diet to clear liquids noted.  Elie Goody, PharmD, BCPS Pager: [phone] 08/06/2011  9:27 AM

## 2011-08-06 NOTE — Progress Notes (Signed)
Pt refused am dressing change, request dressing be changed later in am then again this pm, will relay info to am nurse

## 2011-08-06 NOTE — Progress Notes (Signed)
Pt is alert and oriented, vital signs are stable, pt up and ambulating independantly pt is stable on his feed, pt medicated for abd pain this morning, pt back on clear liquids tolerating diet, g-tube back to gravity drainage with moderate amount of output, JP output 20ml, dressing changed today clean dry and intact, pt refuses heparin Means, Carianna Lague N 08-06-11 18:16 pm

## 2011-08-06 NOTE — Progress Notes (Signed)
Report received from Renard Hamper RN.  Pt resting in bed denies pain.  Pt dressing change completed. Picc line assessed. Pt states he has no needs at this time

## 2011-08-06 NOTE — Progress Notes (Signed)
Patient seen and examined.  Still has some ileus and feels much better with g-tube to drainage.

## 2011-08-07 LAB — COMPREHENSIVE METABOLIC PANEL
ALT: 31 U/L (ref 0–53)
AST: 19 U/L (ref 0–37)
Albumin: 2.6 g/dL — ABNORMAL LOW (ref 3.5–5.2)
Alkaline Phosphatase: 91 U/L (ref 39–117)
Calcium: 8.6 mg/dL (ref 8.4–10.5)
GFR calc Af Amer: >90 mL/min (ref >90)
Glucose, Bld: 163 mg/dL — ABNORMAL HIGH (ref 70–99)
Potassium: 4.1 mEq/L (ref 3.5–5.1)
Sodium: 131 mEq/L — ABNORMAL LOW (ref 135–145)
Total Protein: 6 g/dL (ref 6.0–8.3)

## 2011-08-07 LAB — GLUCOSE, CAPILLARY
Glucose-Capillary: 135 mg/dL — ABNORMAL HIGH (ref 70–99)
Glucose-Capillary: 115 mg/dL — ABNORMAL HIGH (ref 70–99)

## 2011-08-07 MED ORDER — ALTEPLASE 2 MG IJ SOLR
2.0000 mg | Freq: Once | INTRAMUSCULAR | Status: AC
  Administered 2011-08-07: 2 mg
  Filled 2011-08-07: qty 2

## 2011-08-07 MED ORDER — SODIUM CHLORIDE 0.9 % IJ SOLN
10.0000 mL | Freq: Two times a day (BID) | INTRAMUSCULAR | Status: DC
  Administered 2011-08-08 – 2011-08-09 (×3): 15 mL
  Administered 2011-08-15: 30 mL
  Administered 2011-08-15 – 2011-08-16 (×2): 15 mL
  Administered 2011-08-17 – 2011-08-19 (×2): 10 mL
  Administered 2011-08-19: 40 mL

## 2011-08-07 MED ORDER — FAT EMULSION 20 % IV EMUL
250.0000 mL | INTRAVENOUS | Status: AC
  Administered 2011-08-07: 250 mL via INTRAVENOUS
  Filled 2011-08-07: qty 250

## 2011-08-07 MED ORDER — TRACE MINERALS CR-CU-MN-SE-ZN 10-1000-500-60 MCG/ML IV SOLN
INTRAVENOUS | Status: AC
  Administered 2011-08-07: 18:00:00 via INTRAVENOUS
  Filled 2011-08-07: qty 2000

## 2011-08-07 NOTE — Plan of Care (Signed)
Problem: Inadequate Intake (NI-2.1) Goal: Food and/or nutrient delivery Individualized approach for food/nutrient provision.  Outcome: Not Progressing Pt did not tolerate full liquid advancement, remains on clears with G tube drainage

## 2011-08-07 NOTE — Progress Notes (Signed)
PARENTERAL NUTRITION CONSULT NOTE - FOLLOW UP  Pharmacy Consult for TNA Indication: Postoperative Ileus - s/p lysis of adhesions, enteroenterostomy 2/23; s/p exploratory laparotomy with drainage of intra-abdominal abscess and gastrostomy tube placement 3/5.  No Known Allergies  Patient Measurements: Height: 5\' 7"  (170.2 cm) Weight: 114 lb (51.71 kg) IBW/kg (Calculated) : 66.1   Vital Signs: Temp: 98.1 F (36.7 C) (03/15 0600) Temp src: Oral (03/15 0600) BP: 101/68 mmHg (03/15 0600) Pulse Rate: 78  (03/15 0600)  Intake/Output from previous day: 03/14 0701 - 03/15 0700 In: 3464.7 [P.O.:360; I.V.:480; IV Piggyback:50; TPN:2574.7] Out: 4000 [Urine:1850; Drains:2150] Last BM: 3/13   Labs:  [facility] 08/06/11 0605  WBC 5.8  HGB 10.9*  HCT 32.6*  PLT 497*  APTT --  INR --     Basename 08/06/11 0605  NA 132*  K 4.0  CL 97  CO2 28  GLUCOSE 101*  BUN 15  CREATININE 0.76  LABCREA --  CREAT24HRUR --  CALCIUM 8.9  MG 2.3  PHOS 3.2  PROT 6.0  ALBUMIN 2.7*  AST 16  ALT 35  ALKPHOS 103  BILITOT 0.2*  BILIDIR --  IBILI --  PREALBUMIN --  TRIG --  CHOLHDL --  CHOL --   Estimated Creatinine Clearance: 77.2 ml/min (by C-G formula based on Cr of 0.76).    Basename 08/06/11 2243 08/06/11 0533 08/05/11 2157  GLUCAP 117* 122* 126*   Insulin Requirements in the past 24 hours:  None  Medications:  Scheduled:     . alteplase  2 mg Intracatheter Once  . heparin  5,000 Units Subcutaneous Q8H  . insulin aspart  0-9 Units Subcutaneous Q8H  . lip balm  1 application Topical BID  . piperacillin-tazobactam (ZOSYN)  IV  3.375 g Intravenous Q8H  . sodium chloride  10-40 mL Intracatheter Q12H   Infusions:     . dextrose 5 % and 0.9 % NaCl with KCl 20 mEq/L 40 mL/hr at 08/05/11 2238  . TPN (CLINIMIX) +/- additives 80 mL/hr at 08/05/11 1733   And  . fat emulsion 250 mL (08/05/11 1732)  . TPN (CLINIMIX) +/- additives 80 mL/hr at 08/06/11 1729     Current  Nutrition:  TNA (Clinimix E 5/15) at 80 ml/hr (=goal rate) Lipids 20% at 68mL/hr on MWF CL diet (360cc recorded 3/14)  IVF: D-5-NS + KCl 53mEq/L at 40 mL/hr  Nutritional Goals:   Per RD assessment 3/4: 1800-2100 kCal/d, 90-105 grams/d, Fluid: 1.8-2.1L  Goal of Clinimix 5/15 at 80 ml/hr and lipid at 10 ml/hr MWF provides 96 g of protein and 1363 Kcal/day on non lipid days, 1843 Kcal/day on lipid days and average of 1569 Kcal/day from [place].   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   POD# 9 s/p exploratory laparotomy with drainage of intra-abdominal abscess and placement of gastrostomy tube on 3/5.  TNA started 3/4, advanced to goal rate on 3/6, tolerating well.  Prealbumin improved from 12 at baseline to 22 on 3/11.  Electrolytes acceptable 3/14, Na+ slightly low  CBGs well-controlled (<150).  Intolerant of full liquids (abdominal pain and bloating).  Plan:   Continue Clinimix E 5/15 at goal rate of 80 ml/hr  Fat emulsion, MVI and trace elements only on MWF due to ongoing shortage  Change in diet to clear liquids noted  BMET in am  Loralee Pacas, PharmD, BCPS Pager: [phone]  08/07/2011  7:11 AM

## 2011-08-07 NOTE — Progress Notes (Signed)
Patient seen and examined.  Agree with PA's note.  Hopefully, can try g-tube clamping trials tomorrow.

## 2011-08-07 NOTE — Progress Notes (Signed)
Nutrition Follow-up  Diet Order: Clear liquid  TNA: Clinimix E 5/15 @ 80 ml/hr.  Lipids (20% IVFE @ 10 ml/hr), multivitamins, and trace elements are provided 3 times weekly (MWF) due to national backorder.  Provides 1569 kcal and 96 grams protein daily (based on weekly average).  Meets 75% minimum estimated kcal and 96% minimum estimated protein needs.  Additional IVF with D5NS @ 40 ml/hr.  - POD #10 exploratory laparotomy. Pt started clear liquid yesterday. PALB improved from 12 on 3/4 to 22 WNL on 3/11. G tube clamped on 3/12 and diet advanced to full liquids however pt c/o pain from G tube, abdominal pain, and bloating - diet was downgraded back to clear liquid diet and G tube placed on gravity drain with improvement. Met with pt who denied any nausea, taking a few bites of clear liquid tray. Awaiting resolution of ileus.   Meds: Scheduled Meds:   . alteplase  2 mg Intracatheter Once  . heparin  5,000 Units Subcutaneous Q8H  . insulin aspart  0-9 Units Subcutaneous Q8H  . lip balm  1 application Topical BID  . piperacillin-tazobactam (ZOSYN)  IV  3.375 g Intravenous Q8H  . sodium chloride  10-40 mL Intracatheter Q12H   Continuous Infusions:   . dextrose 5 % and 0.9 % NaCl with KCl 20 mEq/L 40 mL/hr at 08/05/11 2238  . TPN (CLINIMIX) +/- additives 80 mL/hr at 08/05/11 1733   And  . fat emulsion 250 mL (08/05/11 1732)  . fat emulsion    . TPN (CLINIMIX) +/- additives 80 mL/hr at 08/06/11 1729  . TPN (CLINIMIX) +/- additives     PRN Meds:.acetaminophen, chlorproMAZINE (THORAZINE) IV, diphenhydrAMINE, guaiFENesin, HYDROmorphone (DILAUDID) injection, magic mouthwash, menthol-cetylpyridinium, ondansetron (ZOFRAN) IV, oxyCODONE-acetaminophen, promethazine, sodium chloride  Labs:  CMP     Component Value Date/Time   NA 132* 08/06/2011 0605   K 4.0 08/06/2011 0605   CL 97 08/06/2011 0605   CO2 28 08/06/2011 0605   GLUCOSE 101* 08/06/2011 0605   BUN 15 08/06/2011 0605   CREATININE 0.76  08/06/2011 0605   CALCIUM 8.9 08/06/2011 0605   PROT 6.0 08/06/2011 0605   ALBUMIN 2.7* 08/06/2011 0605   AST 16 08/06/2011 0605   ALT 35 08/06/2011 0605   ALKPHOS 103 08/06/2011 0605   BILITOT 0.2* 08/06/2011 0605   GFRNONAA >90 08/06/2011 0605   GFRAA >90 08/06/2011 0605   CBG (last 3)   Basename 08/07/11 0547 08/06/11 2243 08/06/11 0533  GLUCAP 120* 117* 122*   - Noted pt with continued low sodium - Albumin remains stable, not likely to improve soon r/t inflammatory process of surgery - CBGs <150   Intake/Output Summary (Last 24 hours) at 08/07/11 0854 Last data filed at 08/07/11 7829  Gross per 24 hour  Intake 3264.67 ml  Output   4000 ml  Net -735.33 ml   Last BM - 08/02/11 G tube - 2,160ml output total yesterday, dark green JP drain - 20ml output total yesterday  Weight Status: No new weights  Re-estimated needs: 1800-2100 calories, 100-120g protein  Nutrition Dx: Inadequate oral intake - ongoing  Goal: TNA to meet >90% of pt's estimated nutritional needs - Met as closely as possible with premixed formula, goal rate is currently providing average of 87% of estimated needs.   Intervention: TNA per pharmacy, recommend increasing calories towards goal of 1800-2100 calories. Recommend re-weigh pt. Noted PALB improving. Diet advancement per MD. Recommend medications for BM/GI motility per MD as pt with lifelong severe  constipation.    Monitor: Weights, labs, BM, TNA   Marshall Cork Pager #: [phone]

## 2011-08-07 NOTE — Progress Notes (Signed)
10 Days Post-Op  Subjective: Afebrile, BP down to 81/54 once yesterday, no BP this AM I/O=2574/4000  2150 thru gastrostomy,  360 PO recorded No labs this AM He feels better, he's smiling some today,  Understands this is a waiting game.   Objective: Vital signs in last 24 hours: Temp:  [97.9 F (36.6 C)-98.1 F (36.7 C)] 98.1 F (36.7 C) (03/15 0600) Pulse Rate:  [78-88] 78  (03/15 0600) Resp:  [16-18] 17  (03/15 0600) BP: (81-114)/(54-77) 101/68 mmHg (03/15 0600) SpO2:  [98 %-99 %] 98 % (03/15 0600) Last BM Date: 08/02/11  Intake/Output from previous day: 03/14 0701 - 03/15 0700 In: 3464.7 [P.O.:360; I.V.:480; IV Piggyback:50; TPN:2574.7] Out: 4000 [Urine:1850; Drains:2150] Intake/Output this shift:    General appearance: alert, appears stated age, cachectic and no distress Resp: clear to auscultation bilaterally GI: soft, non-tender; bowel sounds normal; no masses,  no organomegaly and Incision looks good clear serous drainage from JP, Koolaid or Jello in gastrostomy  Lab Results:   [facility] 08/06/11 0605  WBC 5.8  HGB 10.9*  HCT 32.6*  PLT 497*    BMET  Basename 08/06/11 0605  NA 132*  K 4.0  CL 97  CO2 28  GLUCOSE 101*  BUN 15  CREATININE 0.76  CALCIUM 8.9   PT/INR No results found for this basename: LABPROT:2,INR:2 in the last 72 hours   Studies/Results: Dg Chest 2 View  08/05/2011  *RADIOLOGY REPORT*  Clinical Data: Postop  CHEST - 2 VIEW  Comparison: 07/28/2011  Findings: Cardiomediastinal silhouette is stable.  Mild thoracic levoscoliosis again noted.  No acute infiltrate or pleural effusion.  No pulmonary edema.  Right arm PICC line with tip in upper SVC.  IMPRESSION: No active disease.  Mild thoracic dextroscoliosis.  Original Report Authenticated By: Natasha Mead, M.D.    Anti-infectives: Anti-infectives     Start     Dose/Rate Route Frequency Ordered Stop   07/28/11 2000  piperacillin-tazobactam (ZOSYN) IVPB 3.375 g       3.375 g 12.5 mL/hr over  240 Minutes Intravenous 3 times per day 07/28/11 1914     07/18/11 1135   ertapenem (INVANZ) 1 g in sodium chloride 0.9 % 50 mL IVPB        1 g 100 mL/hr over 30 Minutes Intravenous 60 min pre-op 07/18/11 1135 07/18/11 1235         Current Facility-Administered Medications  Medication Dose Route Frequency Provider Last Rate Last Dose  . acetaminophen (TYLENOL) suppository 650 mg  650 mg Rectal Q6H PRN Ardeth Sportsman, MD      . alteplase (CATHFLO ACTIVASE) injection 2 mg  2 mg Intracatheter Once Mariella Saa, MD   2 mg at 08/07/11 0657  . chlorproMAZINE (THORAZINE) 25 mg in sodium chloride 0.9 % 25 mL IVPB  25 mg Intravenous Q6H PRN Ardeth Sportsman, MD   25 mg at 08/06/11 0054  . dextrose 5 % and 0.9 % NaCl with KCl 20 mEq/L infusion   Intravenous Continuous Freeman Caldron, PA 40 mL/hr at 08/05/11 2238    . diphenhydrAMINE (BENADRYL) injection 12.5-25 mg  12.5-25 mg Intravenous Q6H PRN Ardeth Sportsman, MD      . tpn solution (CLINIMIX E 5/15) 2,000 mL with multivitamins adult 10 mL, trace elements Cr-Cu-Mn-Se-Zn 1 mL infusion   Intravenous Continuous TPN Ky Barban Absher, PHARMD 80 mL/hr at 08/05/11 1733     And  . fat emulsion 20 % infusion 250 mL  250 mL Intravenous  Continuous TPN Ky Barban Absher, PHARMD 10 mL/hr at 08/05/11 1732 250 mL at 08/05/11 1732  . fat emulsion 20 % infusion 250 mL  250 mL Intravenous Continuous TPN Rollene Fare, PHARMD      . guaiFENesin (MUCINEX) 12 hr tablet 600 mg  600 mg Oral BID PRN Atilano Ina, MD,FACS   600 mg at 08/03/11 1852  . heparin injection 5,000 Units  5,000 Units Subcutaneous Q8H Mariella Saa, MD   5,000 Units at 08/06/11 0535  . HYDROmorphone (DILAUDID) injection 1-2 mg  1-2 mg Intravenous Q1H PRN Mariella Saa, MD   1 mg at 08/07/11 0444  . insulin aspart (novoLOG) injection 0-9 Units  0-9 Units Subcutaneous Q8H Mariella Saa, MD   1 Units at 08/06/11 0534  . lip balm (CARMEX) ointment 1 application  1  application Topical BID Ardeth Sportsman, MD   1 application at 08/06/11 2243  . magic mouthwash  15 mL Oral QID PRN Ardeth Sportsman, MD      . menthol-cetylpyridinium (CEPACOL) lozenge 3 mg  1 lozenge Oral PRN Kandis Cocking, MD   3 mg at 07/20/11 0254  . ondansetron (ZOFRAN) injection 4 mg  4 mg Intravenous Q6H PRN Ardeth Sportsman, MD   4 mg at 08/04/11 1726  . oxyCODONE-acetaminophen (PERCOCET) 5-325 MG per tablet 1-2 tablet  1-2 tablet Oral Q4H PRN Sherrie George, PA   1 tablet at 08/06/11 1949  . piperacillin-tazobactam (ZOSYN) IVPB 3.375 g  3.375 g Intravenous Q8H Mariella Saa, MD   3.375 g at 08/07/11 0203  . promethazine (PHENERGAN) injection 12.5-25 mg  12.5-25 mg Intravenous Q6H PRN Ardeth Sportsman, MD   12.5 mg at 07/28/11 2300  . sodium chloride 0.9 % injection 10-40 mL  10-40 mL Intracatheter PRN Mariella Saa, MD   10 mL at 08/07/11 0847  . sodium chloride 0.9 % injection 10-40 mL  10-40 mL Intracatheter Q12H Mariella Saa, MD      . tpn solution (CLINIMIX E 5/15) 2,000 mL infusion   Intravenous Continuous TPN Ky Barban Absher, PHARMD 80 mL/hr at 08/06/11 1729    . tpn solution (CLINIMIX E 5/15) 2,000 mL with multivitamins adult 10 mL, trace elements Cr-Cu-Mn-Se-Zn 1 mL infusion   Intravenous Continuous TPN Rollene Fare, PHARMD        Assessment/Plan SBO s/p ex lap, SBR 07/18/11 Dr. Johna Sheriff  S/p repeat ex lap, open gastrostomy, drainage of abscess 07/28/11 Dr. Johna Sheriff  Ileus -- Continue NPO for now. Suspect flatus in next 24h given good BS.  ID -- Zosyn D#4 empiric for intraabdominal abscess. JP in place. WBC decreased to normal, afebrile.  Hiccups -- Improved on thorazine.  Hyponatremia -- Decrease IVF, add NaCl tabs.  Hypokalemia -- Improved  FEN -- TPN  VTE -- SQH  Malnutrition Prealbumin 12.0 (3/3)  Plan:  Just waiting on ileus to resolve.  My try clamping trials over weekend, TNA is primary nutrition source.      LOS: 21 days     [name] 08/07/2011

## 2011-08-08 LAB — GLUCOSE, CAPILLARY: Glucose-Capillary: 108 mg/dL — ABNORMAL HIGH (ref 70–99)

## 2011-08-08 LAB — BASIC METABOLIC PANEL
CO2: 30 mEq/L (ref 19–32)
Chloride: 101 mEq/L (ref 96–112)
Glucose, Bld: 105 mg/dL — ABNORMAL HIGH (ref 70–99)
Potassium: 3.8 mEq/L (ref 3.5–5.1)
Sodium: 135 mEq/L (ref 135–145)

## 2011-08-08 MED ORDER — ONDANSETRON HCL 4 MG/2ML IJ SOLN: 4.0000 mg | Freq: Four times a day (QID) | INTRAMUSCULAR | Status: DC | PRN

## 2011-08-08 MED ORDER — SODIUM CHLORIDE 0.9 % IJ SOLN
3.0000 mL | INTRAMUSCULAR | Status: DC | PRN
  Administered 2011-08-17 – 2011-08-18 (×2): 3 mL via INTRAVENOUS

## 2011-08-08 MED ORDER — CLINIMIX E/DEXTROSE (5/15) 5 % IV SOLN
INTRAVENOUS | Status: AC
  Administered 2011-08-08: 18:00:00 via INTRAVENOUS
  Filled 2011-08-08: qty 2000

## 2011-08-08 MED ORDER — KCL IN DEXTROSE-NACL 20-5-0.9 MEQ/L-%-% IV SOLN
INTRAVENOUS | Status: DC
  Administered 2011-08-08: 18:00:00 via INTRAVENOUS
  Administered 2011-08-15: 30 mL/h via INTRAVENOUS
  Administered 2011-08-17 – 2011-08-20 (×2): via INTRAVENOUS
  Filled 2011-08-08 (×11): qty 1000

## 2011-08-08 MED ORDER — OXYCODONE HCL 5 MG PO TABS
5.0000 mg | ORAL_TABLET | ORAL | Status: DC | PRN
  Administered 2011-08-10 – 2011-08-17 (×3): 5 mg via ORAL
  Filled 2011-08-08 (×3): qty 1

## 2011-08-08 MED ORDER — ACETAMINOPHEN 650 MG RE SUPP: 650.0000 mg | RECTAL | Status: DC | PRN

## 2011-08-08 MED ORDER — ACETAMINOPHEN 325 MG PO TABS: 650.0000 mg | ORAL_TABLET | ORAL | Status: DC | PRN

## 2011-08-08 MED ORDER — SODIUM CHLORIDE 0.9 % IV SOLN: 250.0000 mL | INTRAVENOUS | Status: DC | PRN

## 2011-08-08 MED ORDER — SODIUM CHLORIDE 0.9 % IJ SOLN
3.0000 mL | Freq: Two times a day (BID) | INTRAMUSCULAR | Status: DC
  Administered 2011-08-16 – 2011-08-17 (×2): 3 mL via INTRAVENOUS

## 2011-08-08 NOTE — Progress Notes (Signed)
11 Days Post-Op  Subjective: Alert. No new complaints. Condition stable. Drinking liquids.  Some flatus, no stool per report.   Mostly this is coming out in the gastrostomy tube. G-tube output 2500 cc per 24 hours.  Objective: Vital signs in last 24 hours: Temp:  [97.4 F (36.3 C)-97.7 F (36.5 C)] 97.7 F (36.5 C) (03/16 0620) Pulse Rate:  [76-80] 76  (03/16 0620) Resp:  [18] 18  (03/16 0620) BP: (94-99)/(64-68) 99/68 mmHg (03/16 0620) SpO2:  [97 %-99 %] 98 % (03/16 0620) Last BM Date: 08/02/11  Intake/Output from previous day: 03/15 0701 - 03/16 0700 In: 2410.7 [P.O.:480; I.V.:650.7; IV Piggyback:100; TPN:1180] Out: 3845 [Urine:1250; Drains:2595] Intake/Output this shift:    General appearance: alert. No distress. Very thin. Smiling saw him. Good insight. GI: abdomen soft. Not distended. Midline wound packed open. Fascia intact. G-tube site looks good. JP drain with serosanguineous drainage.  Lab Results:  Results for orders placed during the hospital encounter of 07/17/11 (from the past 24 hour(s))  COMPREHENSIVE METABOLIC PANEL     Status: Abnormal   Collection Time   08/07/11  8:45 AM      Component Value Range   Sodium 131 (*) 135 - 145 (mEq/L)   Potassium 4.1  3.5 - 5.1 (mEq/L)   Chloride 97  96 - 112 (mEq/L)   CO2 28  19 - 32 (mEq/L)   Glucose, Bld 163 (*) 70 - 99 (mg/dL)   BUN 16  6 - 23 (mg/dL)   Creatinine, Ser 8.29  0.50 - 1.35 (mg/dL)   Calcium 8.6  8.4 - 56.2 (mg/dL)   Total Protein 6.0  6.0 - 8.3 (g/dL)   Albumin 2.6 (*) 3.5 - 5.2 (g/dL)   AST 19  0 - 37 (U/L)   ALT 31  0 - 53 (U/L)   Alkaline Phosphatase 91  39 - 117 (U/L)   Total Bilirubin 0.3  0.3 - 1.2 (mg/dL)   GFR calc non Af Amer >90  >90 (mL/min)   GFR calc Af Amer >90  >90 (mL/min)  GLUCOSE, CAPILLARY     Status: Abnormal   Collection Time   08/07/11  1:57 PM      Component Value Range   Glucose-Capillary 135 (*) 70 - 99 (mg/dL)  GLUCOSE, CAPILLARY     Status: Abnormal   Collection Time   08/07/11 10:24 PM      Component Value Range   Glucose-Capillary 115 (*) 70 - 99 (mg/dL)   Comment 1 Notify RN     Comment 2 Documented in Chart    BASIC METABOLIC PANEL     Status: Abnormal   Collection Time   08/08/11  4:32 AM      Component Value Range   Sodium 135  135 - 145 (mEq/L)   Potassium 3.8  3.5 - 5.1 (mEq/L)   Chloride 101  96 - 112 (mEq/L)   CO2 30  19 - 32 (mEq/L)   Glucose, Bld 105 (*) 70 - 99 (mg/dL)   BUN 18  6 - 23 (mg/dL)   Creatinine, Ser 1.30  0.50 - 1.35 (mg/dL)   Calcium 8.5  8.4 - 86.5 (mg/dL)   GFR calc non Af Amer >90  >90 (mL/min)   GFR calc Af Amer >90  >90 (mL/min)     Studies/Results: @RISRSLT24 @     . heparin  5,000 Units Subcutaneous Q8H  . insulin aspart  0-9 Units Subcutaneous Q8H  . lip balm  1 application Topical BID  .  piperacillin-tazobactam (ZOSYN)  IV  3.375 g Intravenous Q8H  . sodium chloride  10-40 mL Intracatheter Q12H     Assessment/Plan: s/p Procedure(s): EXPLORATORY LAPAROTOMY GASTROSTOMY TUBE INCISION AND DRAINAGE ABSCESS  SBO., status post exploratory laparotomy small bowel resection 07/18/2011, Dr. Johna Sheriff  Status post repeat exploratory laparotomy, open gastrostomy tube, drainage of abscess, 07/28/2011, Dr. Johna Sheriff.  Delayed gastric emptying and prolonged ileus. Marland Kitchen Hopefully this is improving. We'll start intermittent gastrostomy tube clamping today.  ID: Zosyn day #5. For intra-abdominal abscess. JP in place.  Continue TPN  Continued pharmacologic VTE Prophylaxis.    LOS: 22 days    Loula Marcella M 08/08/2011  . .prob

## 2011-08-08 NOTE — Progress Notes (Signed)
PARENTERAL NUTRITION CONSULT NOTE - FOLLOW UP  Pharmacy Consult for TNA Indication: Postoperative Ileus - s/p lysis of adhesions, enteroenterostomy 2/23; s/p exploratory laparotomy with drainage of intra-abdominal abscess and gastrostomy tube placement 3/5.  No Known Allergies  Patient Measurements: Height: 5\' 7"  (170.2 cm) Weight: 114 lb (51.71 kg) IBW/kg (Calculated) : 66.1   Vital Signs: Temp: 97.7 F (36.5 C) (03/16 0620) Temp src: Oral (03/16 0620) BP: 99/68 mmHg (03/16 0620) Pulse Rate: 76  (03/16 0620)  Intake/Output from previous day: 03/15 0701 - 03/16 0700 In: 2410.7 [P.O.:480; I.V.:650.7; IV Piggyback:100; TPN:1180] Out: 3845 [Urine:1250; Drains:2595] Last BM: 3/13   Labs:  [facility] 08/06/11 0605  WBC 5.8  HGB 10.9*  HCT 32.6*  PLT 497*  APTT --  INR --     Basename 08/08/11 0432 08/07/11 0845 08/06/11 0605  NA 135 131* 132*  K 3.8 4.1 4.0  CL 101 97 97  CO2 30 28 28   GLUCOSE 105* 163* 101*  BUN 18 16 15   CREATININE 0.75 0.73 0.76  LABCREA -- -- --  CREAT24HRUR -- -- --  CALCIUM 8.5 8.6 8.9  MG -- -- 2.3  PHOS -- -- 3.2  PROT -- 6.0 6.0  ALBUMIN -- 2.6* 2.7*  AST -- 19 16  ALT -- 31 35  ALKPHOS -- 91 103  BILITOT -- 0.3 0.2*  BILIDIR -- -- --  IBILI -- -- --  PREALBUMIN -- -- --  TRIG -- -- --  CHOLHDL -- -- --  CHOL -- -- --   Estimated Creatinine Clearance: 77.2 ml/min (by C-G formula based on Cr of 0.75).    Basename 08/07/11 2224 08/07/11 1357 08/07/11 0547  GLUCAP 115* 135* 120*   Insulin Requirements in the past 24 hours:  1 unit  Medications:  Scheduled:     . heparin  5,000 Units Subcutaneous Q8H  . insulin aspart  0-9 Units Subcutaneous Q8H  . lip balm  1 application Topical BID  . piperacillin-tazobactam (ZOSYN)  IV  3.375 g Intravenous Q8H  . sodium chloride  10-40 mL Intracatheter Q12H   Infusions:     . dextrose 5 % and 0.9 % NaCl with KCl 20 mEq/L 40 mL/hr at 08/07/11 2236  . fat emulsion 250 mL (08/07/11  1824)  . TPN (CLINIMIX) +/- additives 80 mL/hr at 08/06/11 1729  . TPN (CLINIMIX) +/- additives 80 mL/hr at 08/07/11 1823     Current Nutrition:  TNA (Clinimix E 5/15) at 80 ml/hr  Lipids 20% at 33mL/hr on MWF CL diet (480cc recorded 3/15)  IVF: D-5-NS + KCl 3mEq/L at 40 mL/hr  Nutritional Goals:   Per RD re-assessment 3/15: 1800-2100 kCal/d, Protein: 100-120 grams/d, Fluid: 1.8-2.1L  Old goal of Clinimix 5/15 at 80 ml/hr and lipid at 10 ml/hr MWF provides 96 g of protein and 1363 Kcal/day on non lipid days, 1843 Kcal/day on lipid days and average of 1569 Kcal/day from Sun-Saturday.   New goal (based on higher protein goal 3/15): Clinimix E 5/15 at 63ml/hr and lipid at 10 ml/hr MWF provides 108g Protein and 1533 kcal on non-lipid days, 2013 kcal on lipid days, avg 1739 kcals. 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   POD# 10 s/p exploratory laparotomy with drainage of intra-abdominal abscess and placement of gastrostomy tube on 3/5.  TNA started 3/4, advanced  to initial goal rate on 3/6, re-advanced to new goal rate 3/16pm.  Prealbumin improved from 12 at baseline to 22 on 3/11.  Electrolytes wnl.  CBGs well-controlled (<150).  Intolerant of full liquids (abdominal pain and bloating).  Plan:   Increased Clinimix E 5/15 to 90 ml/hr (new goal rate)  Decrease IVF to 80ml/hr at 18:00  Fat emulsion, MVI and trace elements only on MWF due to ongoing shortage  F/U further diet advancement vs tolerance  Darrol Angel, PharmD Pager: [phone] 08/08/2011  8:30 AM

## 2011-08-09 LAB — GLUCOSE, CAPILLARY: Glucose-Capillary: 110 mg/dL — ABNORMAL HIGH (ref 70–99)

## 2011-08-09 MED ORDER — CLINIMIX E/DEXTROSE (5/15) 5 % IV SOLN
INTRAVENOUS | Status: AC
  Administered 2011-08-09: 17:00:00 via INTRAVENOUS
  Filled 2011-08-09: qty 2000

## 2011-08-09 NOTE — Progress Notes (Addendum)
PARENTERAL NUTRITION CONSULT NOTE - FOLLOW UP  Pharmacy Consult for TNA Indication: Postoperative Ileus - s/p lysis of adhesions, enteroenterostomy 2/23; s/p exploratory laparotomy with drainage of intra-abdominal abscess and gastrostomy tube placement 3/5.  No Known Allergies  Patient Measurements: Height: 5\' 7"  (170.2 cm) Weight: 114 lb (51.71 kg) IBW/kg (Calculated) : 66.1   Vital Signs: Temp: 97.6 F (36.4 C) (03/17 0612) Temp src: Oral (03/17 0612) BP: 106/74 mmHg (03/17 0612) Pulse Rate: 75  (03/17 0612)  Intake/Output from previous day: 03/16 0701 - 03/17 0700 In: 2970.5 [P.O.:1220; I.V.:370; IV Piggyback:100; TPN:1220.5] Out: 2535 [Urine:1850; Drains:685] Last BM: 3/13   Labs: No results found for this basename: WBC:3,HGB:3,HCT:3,PLT:3,APTT:3,INR:3 in the last 72 hours   Basename 08/08/11 0432 08/07/11 0845  NA 135 131*  K 3.8 4.1  CL 101 97  CO2 30 28  GLUCOSE 105* 163*  BUN 18 16  CREATININE 0.75 0.73  LABCREA -- --  CREAT24HRUR -- --  CALCIUM 8.5 8.6  MG -- --  PHOS -- --  PROT -- 6.0  ALBUMIN -- 2.6*  AST -- 19  ALT -- 31  ALKPHOS -- 91  BILITOT -- 0.3  BILIDIR -- --  IBILI -- --  PREALBUMIN -- --  TRIG -- --  CHOLHDL -- --  CHOL -- --   Estimated Creatinine Clearance: 77.2 ml/min (by C-G formula based on Cr of 0.75).    Basename 08/08/11 2143 08/08/11 1431 08/08/11 0723  GLUCAP 101* 108* 132*   Insulin Requirements in the past 24 hours:  1 unit  Medications:  Scheduled:     . heparin  5,000 Units Subcutaneous Q8H  . insulin aspart  0-9 Units Subcutaneous Q8H  . lip balm  1 application Topical BID  . piperacillin-tazobactam (ZOSYN)  IV  3.375 g Intravenous Q8H  . sodium chloride  10-40 mL Intracatheter Q12H  . sodium chloride  3 mL Intravenous Q12H   Infusions:     . dextrose 5 % and 0.9 % NaCl with KCl 20 mEq/L 40 mL/hr at 08/07/11 2236  . dextrose 5 % and 0.9 % NaCl with KCl 20 mEq/L 30 mL/hr at 08/08/11 1800  . fat  emulsion 250 mL (08/07/11 1824)  . TPN (CLINIMIX) +/- additives 90 mL/hr at 08/08/11 1757  . TPN (CLINIMIX) +/- additives 80 mL/hr at 08/07/11 1823     Current Nutrition:  TNA (Clinimix E 5/15) at 90 ml/hr  Lipids 20% at 25mL/hr on MWF CL diet ( recorded 3/16)  IVF: D5-NS + KCl 100mEq/L at 30 mL/hr  Nutritional Goals:   Per RD re-assessment 3/15: 1800-2100 kCal/d, Protein: 100-120 grams/d, Fluid: 1.8-2.1L  Old goal of Clinimix 5/15 at 80 ml/hr and lipid at 10 ml/hr MWF provides 96 g of protein and 1363 Kcal/day on non lipid days, 1843 Kcal/day on lipid days and average of 1569 Kcal/day from Sun-Saturday.   New goal (based on higher protein goal 3/15): Clinimix E 5/15 at 51ml/hr and lipid at 10 ml/hr MWF provides 108g Protein and 1533 kcal on non-lipid days, 2013 kcal on lipid days, avg 1739 kcals. 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   POD# 12 s/p exploratory laparotomy with drainage of intra-abdominal abscess and placement of gastrostomy tube on 3/5.  TNA started 3/4, advanced to initial goal rate on 3/6, re-advanced to new goal rate  3/16pm.  Prealbumin improved from 12 at baseline to 22 on 3/11.  No labs today - full TNA lab panel tomorrow am.  CBGs well-controlled (<150).  Previously intolerant of full liquids (abdominal pain and bloating), PO intake recorded 3/16, await MD assessment of tolerance.  Plan:   Continue Clinimix E 5/15 at 90 ml/hr (new goal rate)  Fat emulsion, MVI and trace elements only on MWF due to ongoing shortage  F/U further diet advancement vs tolerance  Darrol Angel, PharmD Pager: [phone] 08/09/2011  7:46 AM   ADDENDUM: Change rate to 81 ml/hr to run over 24h. (TNA is 2L bag) Reassess TNA formula vs diet advancement/tolerance in  am.  Darrol Angel, PharmD Pager: [phone] 5:16 PM 08/09/2011

## 2011-08-09 NOTE — Progress Notes (Signed)
12 Days Post-Op  Subjective: Patient is alert, stable, comfortable. He did well with clamping of his gastrostomy tube yesterday. Tolerating by mouth clear liquids. Had one bowel movement. Denies nausea or cramps.  Objective: Vital signs in last 24 hours: Temp:  [97.6 F (36.4 C)-98.2 F (36.8 C)] 97.6 F (36.4 C) (03/17 0612) Pulse Rate:  [75-82] 75  (03/17 0612) Resp:  [18] 18  (03/17 0612) BP: (100-106)/(67-74) 106/74 mmHg (03/17 0612) SpO2:  [99 %] 99 % (03/17 0612) Last BM Date: 08/08/11  Intake/Output from previous day: 03/16 0701 - 03/17 0700 In: 2970.5 [P.O.:1220; I.V.:370; IV Piggyback:100; TPN:1220.5] Out: 2535 [Urine:1850; Drains:685] Intake/Output this shift: Total I/O In: -  Out: 450 [Urine:450]  General appearance: alert GI: abdomen is soft and nondistended. He is basically nontender everywhere except around the gastrostomy tube. Midline wound shows the skin is packed open the fascia is intact. There is no purulence. No unusual drainage.  Lab Results:  Results for orders placed during the hospital encounter of 07/17/11 (from the past 24 hour(s))  GLUCOSE, CAPILLARY     Status: Abnormal   Collection Time   08/08/11  2:31 PM      Component Value Range   Glucose-Capillary 108 (*) 70 - 99 (mg/dL)  GLUCOSE, CAPILLARY     Status: Abnormal   Collection Time   08/08/11  9:43 PM      Component Value Range   Glucose-Capillary 101 (*) 70 - 99 (mg/dL)     Studies/Results: @RISRSLT24 @     . heparin  5,000 Units Subcutaneous Q8H  . insulin aspart  0-9 Units Subcutaneous Q8H  . lip balm  1 application Topical BID  . piperacillin-tazobactam (ZOSYN)  IV  3.375 g Intravenous Q8H  . sodium chloride  10-40 mL Intracatheter Q12H  . sodium chloride  3 mL Intravenous Q12H     Assessment/Plan: s/p Procedure(s): EXPLORATORY LAPAROTOMY GASTROSTOMY TUBE INCISION AND DRAINAGE ABSCESS   SBO., status post exploratory laparotomy small bowel resection 07/18/2011, Dr.  Johna Sheriff   Status post repeat exploratory laparotomy, open gastrostomy tube, drainage of abscess, 07/28/2011, Dr. Johna Sheriff.   Remote history surgery for imperforate anus as an infant  Delayed gastric emptying and prolonged ileus. . This is improving. We'll continue intermittent gastrostomy tube clamping today. Advanced to full liquid diet.  ID: Zosyn day #13 For intra-abdominal abscess. JP in place.  Possibly discontinue antibiotics early next week.  Continue TPN   Continued pharmacologic VTE Prophylaxis       LOS: 23 days    Adaleen Hulgan M. Derrell Lolling, M.D., [facility] General and Minimally invasive Surgery Breast and Colorectal Surgery Office:   [phone] Pager:   [phone]  08/09/2011  . .prob

## 2011-08-10 LAB — CBC
HCT: 31.1 % — ABNORMAL LOW (ref 39.0–52.0)
Hemoglobin: 10.4 g/dL — ABNORMAL LOW (ref 13.0–17.0)
MCH: 28.3 pg (ref 26.0–34.0)
MCHC: 33.4 g/dL (ref 30.0–36.0)
MCV: 84.7 fL (ref 78.0–100.0)
Platelets: 400 K/uL (ref 150–400)
RBC: 3.67 MIL/uL — ABNORMAL LOW (ref 4.22–5.81)
RDW: 14.5 % (ref 11.5–15.5)
WBC: 4.6 K/uL (ref 4.0–10.5)

## 2011-08-10 LAB — DIFFERENTIAL
Basophils Absolute: 0.1 K/uL (ref 0.0–0.1)
Basophils Relative: 1 % (ref 0–1)
Eosinophils Absolute: 0.4 K/uL (ref 0.0–0.7)
Eosinophils Relative: 8 % — ABNORMAL HIGH (ref 0–5)
Lymphocytes Relative: 21 % (ref 12–46)
Lymphs Abs: 1 K/uL (ref 0.7–4.0)
Monocytes Absolute: 0.4 K/uL (ref 0.1–1.0)
Monocytes Relative: 8 % (ref 3–12)
Neutro Abs: 2.9 K/uL (ref 1.7–7.7)
Neutrophils Relative %: 62 % (ref 43–77)

## 2011-08-10 LAB — COMPREHENSIVE METABOLIC PANEL WITH GFR
ALT: 20 U/L (ref 0–53)
AST: 15 U/L (ref 0–37)
Albumin: 2.5 g/dL — ABNORMAL LOW (ref 3.5–5.2)
Alkaline Phosphatase: 68 U/L (ref 39–117)
BUN: 15 mg/dL (ref 6–23)
CO2: 27 meq/L (ref 19–32)
Calcium: 8.5 mg/dL (ref 8.4–10.5)
Chloride: 100 meq/L (ref 96–112)
Creatinine, Ser: 0.79 mg/dL (ref 0.50–1.35)
GFR calc Af Amer: >90 mL/min
GFR calc non Af Amer: >90 mL/min
Glucose, Bld: 95 mg/dL (ref 70–99)
Potassium: 3.9 meq/L (ref 3.5–5.1)
Sodium: 133 meq/L — ABNORMAL LOW (ref 135–145)
Total Bilirubin: 0.2 mg/dL — ABNORMAL LOW (ref 0.3–1.2)
Total Protein: 5.7 g/dL — ABNORMAL LOW (ref 6.0–8.3)

## 2011-08-10 LAB — CHOLESTEROL, TOTAL: Cholesterol: 80 mg/dL (ref 0–200)

## 2011-08-10 LAB — GLUCOSE, CAPILLARY
Glucose-Capillary: 103 mg/dL — ABNORMAL HIGH (ref 70–99)
Glucose-Capillary: 111 mg/dL — ABNORMAL HIGH (ref 70–99)
Glucose-Capillary: 83 mg/dL (ref 70–99)

## 2011-08-10 LAB — MAGNESIUM: Magnesium: 2.1 mg/dL (ref 1.5–2.5)

## 2011-08-10 LAB — TRIGLYCERIDES: Triglycerides: 41 mg/dL

## 2011-08-10 LAB — PHOSPHORUS: Phosphorus: 3.5 mg/dL (ref 2.3–4.6)

## 2011-08-10 LAB — PREALBUMIN: Prealbumin: 29.1 mg/dL (ref 17.0–34.0)

## 2011-08-10 MED ORDER — TRACE MINERALS CR-CU-MN-SE-ZN 10-1000-500-60 MCG/ML IV SOLN
INTRAVENOUS | Status: AC
  Administered 2011-08-10: 18:00:00 via INTRAVENOUS
  Filled 2011-08-10: qty 2000

## 2011-08-10 MED ORDER — FAT EMULSION 20 % IV EMUL
240.0000 mL | INTRAVENOUS | Status: AC
  Administered 2011-08-10: 240 mL via INTRAVENOUS
  Filled 2011-08-10: qty 250

## 2011-08-10 NOTE — Progress Notes (Signed)
13 Days Post-Op  Subjective: Afebrile, VSS, PO 1220 cc yesterday/625 recorded from gastrotomy, labs OK He had some stool for first time yesterday. Objective: Vital signs in last 24 hours: Temp:  [97.7 F (36.5 C)-97.8 F (36.6 C)] 97.7 F (36.5 C) (03/17 2300) Pulse Rate:  [80-81] 80  (03/17 2300) Resp:  [18] 18  (03/17 2300) BP: (100-115)/(66-78) 100/66 mmHg (03/17 2300) SpO2:  [90 %-99 %] 99 % (03/17 2300) Last BM Date: 08/08/11  Intake/Output from previous day: 03/17 0701 - 03/18 0700 In: 4435 [P.O.:1220; I.V.:802.5; IV Piggyback:200; TPN:2137.5] Out: 1280 [Urine:650; Drains:630] Intake/Output this shift: Total I/O In: -  Out: 600 [Urine:600]  General appearance: alert, cooperative, appears older than stated age, cachectic and no distress Resp: clear to auscultation bilaterally GI: soft, non-tender; bowel sounds normal; no masses,  no organomegaly and open area of wound looks good, Gastrostomy is clamped.  Lab Results:   Basename 08/10/11 0530  WBC 4.6  HGB 10.4*  HCT 31.1*  PLT 400    Lab 08/10/11 0530 08/07/11 0845 08/06/11 0605  AST 15 19 16   ALT 20 31 35  ALKPHOS 68 91 103  BILITOT 0.2* 0.3 0.2*  PROT 5.7* 6.0 6.0  ALBUMIN 2.5* 2.6* 2.7*    BMET  Basename 08/10/11 0530 08/08/11 0432  NA 133* 135  K 3.9 3.8  CL 100 101  CO2 27 30  GLUCOSE 95 105*  BUN 15 18  CREATININE 0.79 0.75  CALCIUM 8.5 8.5   PT/INR No results found for this basename: LABPROT:2,INR:2 in the last 72 hours   Studies/Results: No results found.  Anti-infectives: Anti-infectives     Start     Dose/Rate Route Frequency Ordered Stop   07/28/11 2000  piperacillin-tazobactam (ZOSYN) IVPB 3.375 g       3.375 g 12.5 mL/hr over 240 Minutes Intravenous 3 times per day 07/28/11 1914     07/18/11 1135   ertapenem (INVANZ) 1 g in sodium chloride 0.9 % 50 mL IVPB        1 g 100 mL/hr over 30 Minutes Intravenous 60 min pre-op 07/18/11 1135 07/18/11 1235         Current  Facility-Administered Medications  Medication Dose Route Frequency Provider Last Rate Last Dose  . 0.9 %  sodium chloride infusion  250 mL Intravenous PRN Ernestene Mention, MD      . acetaminophen (TYLENOL) suppository 650 mg  650 mg Rectal Q6H PRN Ardeth Sportsman, MD      . acetaminophen (TYLENOL) tablet 650 mg  650 mg Oral Q4H PRN Ernestene Mention, MD       Or  . acetaminophen (TYLENOL) suppository 650 mg  650 mg Rectal Q4H PRN Ernestene Mention, MD      . chlorproMAZINE (THORAZINE) 25 mg in sodium chloride 0.9 % 25 mL IVPB  25 mg Intravenous Q6H PRN Ardeth Sportsman, MD   25 mg at 08/06/11 0054  . dextrose 5 % and 0.9 % NaCl with KCl 20 mEq/L infusion   Intravenous Continuous Annia Belt, PHARMD 30 mL/hr at 08/08/11 1800    . diphenhydrAMINE (BENADRYL) injection 12.5-25 mg  12.5-25 mg Intravenous Q6H PRN Ardeth Sportsman, MD      . fat emulsion 20 % infusion 240 mL  240 mL Intravenous Continuous TPN Annia Belt, PHARMD      . guaiFENesin (MUCINEX) 12 hr tablet 600 mg  600 mg Oral BID PRN Atilano Ina, MD,FACS   600 mg  at 08/03/11 1852  . heparin injection 5,000 Units  5,000 Units Subcutaneous Q8H Mariella Saa, MD   5,000 Units at 08/06/11 0535  . HYDROmorphone (DILAUDID) injection 1-2 mg  1-2 mg Intravenous Q1H PRN Mariella Saa, MD   1 mg at 08/10/11 0541  . insulin aspart (novoLOG) injection 0-9 Units  0-9 Units Subcutaneous Q8H Mariella Saa, MD   1 Units at 08/08/11 0753  . lip balm (CARMEX) ointment 1 application  1 application Topical BID Ardeth Sportsman, MD   1 application at 08/09/11 2200  . magic mouthwash  15 mL Oral QID PRN Ardeth Sportsman, MD      . menthol-cetylpyridinium (CEPACOL) lozenge 3 mg  1 lozenge Oral PRN Kandis Cocking, MD   3 mg at 07/20/11 0254  . ondansetron (ZOFRAN) injection 4 mg  4 mg Intravenous Q6H PRN Ardeth Sportsman, MD   4 mg at 08/04/11 1726  . oxyCODONE (Oxy IR/ROXICODONE) immediate release tablet 5-10 mg  5-10 mg Oral Q4H  PRN Ernestene Mention, MD      . oxyCODONE-acetaminophen (PERCOCET) 5-325 MG per tablet 1-2 tablet  1-2 tablet Oral Q4H PRN Sherrie George, PA   1 tablet at 08/09/11 1304  . piperacillin-tazobactam (ZOSYN) IVPB 3.375 g  3.375 g Intravenous Q8H Mariella Saa, MD   3.375 g at 08/10/11 0155  . promethazine (PHENERGAN) injection 12.5-25 mg  12.5-25 mg Intravenous Q6H PRN Ardeth Sportsman, MD   12.5 mg at 07/28/11 2300  . sodium chloride 0.9 % injection 10-40 mL  10-40 mL Intracatheter PRN Mariella Saa, MD   10 mL at 08/08/11 0436  . sodium chloride 0.9 % injection 10-40 mL  10-40 mL Intracatheter Q12H Mariella Saa, MD   15 mL at 08/09/11 2200  . sodium chloride 0.9 % injection 3 mL  3 mL Intravenous Q12H Ernestene Mention, MD      . sodium chloride 0.9 % injection 3 mL  3 mL Intravenous PRN Ernestene Mention, MD      . tpn solution (CLINIMIX E 5/15) 2,000 mL infusion   Intravenous Continuous TPN Annia Belt, PHARMD 90 mL/hr at 08/08/11 1757    . tpn solution (CLINIMIX E 5/15) 2,000 mL infusion   Intravenous Continuous TPN Annia Belt, PHARMD 90 mL/hr at 08/09/11 1704    . tpn solution (CLINIMIX E 5/15) 2,000 mL with multivitamins adult 10 mL, trace elements Cr-Cu-Mn-Se-Zn 1 mL infusion   Intravenous Continuous TPN Annia Belt, PHARMD        Assessment/Plan SBO s/p ex lap, SBR 07/18/11 Dr. Johna Sheriff POD 23 S/p repeat ex lap, open gastrostomy, drainage of abscess 07/28/11 Dr. Johna Sheriff POD 13 Ileus -- Continue NPO for now. Suspect flatus in next 24h given good BS. Started clamping trials yesterday ID -- Zosyn D#4 empiric for intraabdominal abscess. JP in place. WBC decreased to normal, afebrile.  Hiccups -- Improved on thorazine.  Hyponatremia -- Decrease IVF, add NaCl tabs.  Hypokalemia -- Improved  FEN -- TPN  VTE -- SQH  Malnutrition Prealbumin 12.0 (3/3)  PLAN:  On clamping q4h then off 1 hour.  Will continue that for now.  If he does well and has  further BM I will increase time tomorrow.  He failed clamping last week. Continue clears/TNA for now.      LOS: 24 days    Nikholas Geffre 08/10/2011

## 2011-08-10 NOTE — Progress Notes (Signed)
PARENTERAL NUTRITION CONSULT NOTE - FOLLOW UP  Pharmacy Consult for TNA Indication: Postoperative Ileus - s/p lysis of adhesions, enteroenterostomy 2/23; s/p exploratory laparotomy with drainage of intra-abdominal abscess and gastrostomy tube placement 3/5.  No Known Allergies  Patient Measurements: Height: 5\' 7"  (170.2 cm) Weight: 114 lb (51.71 kg) IBW/kg (Calculated) : 66.1   Vital Signs: Temp: 97.7 F (36.5 C) (03/17 2300) Temp src: Oral (03/17 2300) BP: 100/66 mmHg (03/17 2300) Pulse Rate: 80  (03/17 2300)  Intake/Output from previous day: 03/17 0701 - 03/18 0700 In: 4435 [P.O.:1220; I.V.:802.5; IV Piggyback:200; TPN:2137.5] Out: 1280 [Urine:650; Drains:630]  Labs:  [facility] 08/10/11 0530  WBC 4.6  HGB 10.4*  HCT 31.1*  PLT 400  APTT --  INR --     Basename 08/10/11 0530 08/08/11 0432  NA 133* 135  K 3.9 3.8  CL 100 101  CO2 27 30  GLUCOSE 95 105*  BUN 15 18  CREATININE 0.79 0.75  LABCREA -- --  CREAT24HRUR -- --  CALCIUM 8.5 8.5  MG 2.1 --  PHOS 3.5 --  PROT 5.7* --  ALBUMIN 2.5* --  AST 15 --  ALT 20 --  ALKPHOS 68 --  BILITOT 0.2* --  BILIDIR -- --  IBILI -- --  PREALBUMIN -- --  TRIG 41 --  CHOLHDL -- --  CHOL 80 --   Estimated Creatinine Clearance: 77.2 ml/min (by C-G formula based on Cr of 0.79).    Basename 08/10/11 0607 08/09/11 2205 08/09/11 1429  GLUCAP 103* 99 113*   Insulin Requirements in the past 24 hours:  None  Medications:  Scheduled:     . heparin  5,000 Units Subcutaneous Q8H  . insulin aspart  0-9 Units Subcutaneous Q8H  . lip balm  1 application Topical BID  . piperacillin-tazobactam (ZOSYN)  IV  3.375 g Intravenous Q8H  . sodium chloride  10-40 mL Intracatheter Q12H  . sodium chloride  3 mL Intravenous Q12H   Infusions:     . dextrose 5 % and 0.9 % NaCl with KCl 20 mEq/L 30 mL/hr at 08/08/11 1800  . TPN (CLINIMIX) +/- additives 90 mL/hr at 08/08/11 1757  . TPN (CLINIMIX) +/- additives 90 mL/hr at 08/09/11  1704     Current Nutrition:  TNA (Clinimix E 5/15) at 81 ml/hr  Lipids 20% at 48mL/hr on MWF Full liq diet as of 3/17am ( recorded 3/17)  IVF: D5-NS + KCl 28mEq/L at 30 mL/hr  Nutritional Goals:   Per RD re-assessment 3/15: 1800-2100 kCal/d, Protein: 100-120 grams/d, Fluid: 1.8-2.1L  Old goal of Clinimix 5/15 at 80 ml/hr and lipid at 10 ml/hr MWF provides 96 g of protein and 1363 Kcal/day on non lipid days, 1843 Kcal/day on lipid days and average of 1569 Kcal/day from Sun-Saturday.   New goal (based on higher protein goal 3/15): Clinimix E 5/15 at 26ml/hr and lipid at 10 ml/hr MWF provides 108g Protein and 1533 kcal on non-lipid days, 2013 kcal on lipid days, avg 1739 kcals. 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   POD# 13 s/p exploratory laparotomy with drainage of intra-abdominal abscess and placement of gastrostomy tube on 3/5.  TNA started 3/4  Prealbumin improved from 12 at baseline to 22 on 3/11.  Lytes wnl except Na slightly low (unable to change TNA content)  CBGs well-controlled (<150).  Previously intolerant of full liquids (abdominal pain and bloating), PO intake recorded 3/17, diet advanced to full liquids 3/17 am  3/17 MD note indicates improvement of prolonged ileus and delayed gastric emptying  Plan:   Continue Clinimix E 5/15 at 81 ml/hr  Fat emulsion, MVI and trace elements only on MWF due to ongoing shortage  Awaiting MD assessment on ileus improvement vs when to start weaning TNA to off  Darrol Angel, PharmD Pager: [phone] 08/10/2011  8:47 AM

## 2011-08-11 LAB — GLUCOSE, CAPILLARY
Glucose-Capillary: 108 mg/dL — ABNORMAL HIGH (ref 70–99)
Glucose-Capillary: 117 mg/dL — ABNORMAL HIGH (ref 70–99)
Glucose-Capillary: 99 mg/dL (ref 70–99)

## 2011-08-11 MED ORDER — CLINIMIX E/DEXTROSE (5/15) 5 % IV SOLN
INTRAVENOUS | Status: AC
  Administered 2011-08-11: 18:00:00 via INTRAVENOUS
  Filled 2011-08-11: qty 2000

## 2011-08-11 MED ORDER — BOOST / RESOURCE BREEZE PO LIQD
1.0000 | Freq: Two times a day (BID) | ORAL | Status: DC
  Administered 2011-08-11 – 2011-08-20 (×13): 1 via ORAL

## 2011-08-11 MED ORDER — SODIUM CHLORIDE 0.9 % IV BOLUS (SEPSIS)
1000.0000 mL | Freq: Once | INTRAVENOUS | Status: AC
  Administered 2011-08-11: 1000 mL via INTRAVENOUS

## 2011-08-11 NOTE — Progress Notes (Signed)
PARENTERAL NUTRITION CONSULT NOTE - FOLLOW UP  Pharmacy Consult for TNA Indication: Postoperative Ileus - s/p lysis of adhesions, enteroenterostomy 2/23; s/p exploratory laparotomy with drainage of intra-abdominal abscess and gastrostomy tube placement 3/5.  No Known Allergies  Patient Measurements: Height: 5\' 7"  (170.2 cm) Weight: 114 lb (51.71 kg) IBW/kg (Calculated) : 66.1   Vital Signs: Temp: 97.7 F (36.5 C) (03/19 0600) Temp src: Oral (03/19 0600) BP: 94/57 mmHg (03/19 0600) Pulse Rate: 76  (03/19 0600)  Intake/Output from previous day: 03/18 0701 - 03/19 0700 In: 480 [P.O.:480] Out: 3000 [Urine:2775; Drains:225]  Labs:  [facility] 08/10/11 0530  WBC 4.6  HGB 10.4*  HCT 31.1*  PLT 400  APTT --  INR --     Basename 08/10/11 0530  NA 133*  K 3.9  CL 100  CO2 27  GLUCOSE 95  BUN 15  CREATININE 0.79  LABCREA --  CREAT24HRUR --  CALCIUM 8.5  MG 2.1  PHOS 3.5  PROT 5.7*  ALBUMIN 2.5*  AST 15  ALT 20  ALKPHOS 68  BILITOT 0.2*  BILIDIR --  IBILI --  PREALBUMIN 29.1  TRIG 41  CHOLHDL --  CHOL 80   Estimated Creatinine Clearance: 77.2 ml/min (by C-G formula based on Cr of 0.79).    Basename 08/11/11 0617 08/10/11 2300 08/10/11 1502  GLUCAP 108* 83 111*   Insulin Requirements in the past 24 hours:  None  Medications:  Scheduled:     . heparin  5,000 Units Subcutaneous Q8H  . insulin aspart  0-9 Units Subcutaneous Q8H  . lip balm  1 application Topical BID  . piperacillin-tazobactam (ZOSYN)  IV  3.375 g Intravenous Q8H  . sodium chloride  10-40 mL Intracatheter Q12H  . sodium chloride  3 mL Intravenous Q12H   Infusions:     . dextrose 5 % and 0.9 % NaCl with KCl 20 mEq/L 30 mL/hr at 08/08/11 1800  . fat emulsion 240 mL (08/10/11 1808)  . TPN (CLINIMIX) +/- additives 90 mL/hr at 08/09/11 1704  . TPN (CLINIMIX) +/- additives 81 mL/hr at 08/10/11 1808     Current Nutrition:  TNA (Clinimix E 5/15) at 81 ml/hr  Lipids 20% at 17mL/hr on  MWF Full liq diet as of 3/17am (480 ml recorded 3/18)  IVF: D5-NS + KCl 46mEq/L at 30 mL/hr  Nutritional Goals:   Per RD re-assessment 3/15: 1800-2100 kCal/d, Protein: 100-120 grams/d, Fluid: 1.8-2.1L  Old goal of Clinimix 5/15 at 80 ml/hr and lipid at 10 ml/hr MWF provides 96 g of protein and 1363 Kcal/day on non lipid days, 1843 Kcal/day on lipid days and average of 1569 Kcal/day from Sun-Saturday.   New goal (based on higher protein goal 3/15): Clinimix E 5/15 at 61ml/hr and lipid at 10 ml/hr MWF provides 108g Protein and 1533 kcal on non-lipid days, 2013 kcal on lipid days, avg 1739 kcals. 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   POD# 14 s/p exploratory laparotomy with drainage of intra-abdominal abscess and placement of gastrostomy tube on 3/5.  TNA started 3/4  Prealbumin improved from 12 at baseline to 22 on 3/11. Further improvement to 29.1 3/18.  Lytes wnl (3/18) except Na slightly low (unable to change TNA content)  CBGs well-controlled (<150).   Previously intolerant of full liquids (abdominal pain and bloating), 480 ml PO intake  recorded 3/17, diet advanced to full liquids 3/17 am. Trial of G-tube clamping started 3/18.  Plan:   Continue Clinimix E 5/15 at 81 ml/hr  Fat emulsion, MVI and trace elements only on MWF due to ongoing shortage  Await further advancement of diet to wean TNA  Gwen Her PharmD  [phone] 08/11/2011 7:43 AM

## 2011-08-11 NOTE — Plan of Care (Signed)
Problem: Inadequate Intake (NI-2.1) Goal: Food and/or nutrient delivery Individualized approach for food/nutrient provision.  Outcome: Progressing 60% of full liquid lunch tray yesterday

## 2011-08-11 NOTE — Progress Notes (Signed)
14 Days Post-Op  Subjective: Feels fine today Afebrile, BP down some 82/60, 94/57 yest and this AM, I/o is incomplete, Labs yesterday showed NA 133 Diet upped to full liquids, his gastrostomy is open and not much draining.  He hasn't eaten breakfast yet.  NO BM yesterday. Objective: Vital signs in last 24 hours: Temp:  [97.7 F (36.5 C)-97.9 F (36.6 C)] 97.7 F (36.5 C) (03/19 0600) Pulse Rate:  [76-82] 76  (03/19 0600) Resp:  [18-19] 18  (03/19 0600) BP: (82-101)/(57-68) 94/57 mmHg (03/19 0600) SpO2:  [99 %] 99 % (03/19 0600) Last BM Date: 08/08/11  Intake/Output from previous day: 03/18 0701 - 03/19 0700 In: 480 [P.O.:480] Out: 3000 [Urine:2775; Drains:225] Intake/Output this shift: Total I/O In: -  Out: 550 [Urine:550]  General appearance: alert, cooperative, cachectic and no distress Resp: clear to auscultation bilaterally GI: Bowel sounds active, no distended, not tender, minimal drainage from JP.    Lab Results:   Basename 08/10/11 0530  WBC 4.6  HGB 10.4*  HCT 31.1*  PLT 400    BMET  Basename 08/10/11 0530  NA 133*  K 3.9  CL 100  CO2 27  GLUCOSE 95  BUN 15  CREATININE 0.79  CALCIUM 8.5   PT/INR No results found for this basename: LABPROT:2,INR:2 in the last 72 hours   Lab 08/10/11 0530 08/07/11 0845 08/06/11 0605  AST 15 19 16   ALT 20 31 35  ALKPHOS 68 91 103  BILITOT 0.2* 0.3 0.2*  PROT 5.7* 6.0 6.0  ALBUMIN 2.5* 2.6* 2.7*     Lipase  No results found for this basename: lipase     Studies/Results: No results found.  Medications:    . heparin  5,000 Units Subcutaneous Q8H  . insulin aspart  0-9 Units Subcutaneous Q8H  . lip balm  1 application Topical BID  . piperacillin-tazobactam (ZOSYN)  IV  3.375 g Intravenous Q8H  . sodium chloride  10-40 mL Intracatheter Q12H  . sodium chloride  3 mL Intravenous Q12H    Assessment/Plan SBO s/p ex lap, SBR 07/18/11 Dr. Johna Sheriff POD 23  S/p repeat ex lap, open gastrostomy, drainage of  abscess 07/28/11 Dr. Johna Sheriff POD 13  Ileus -- Continue NPO for now. Suspect flatus in next 24h given good BS. Started clamping trials yesterday  ID -- Zosyn D#4 empiric for intraabdominal abscess. JP in place. WBC decreased to normal, afebrile.  Hiccups -- Improved on thorazine.  Hyponatremia -- Decrease IVF, add NaCl tabs.  Hypokalemia -- Improved  FEN -- TPN  VTE -- SQH  Malnutrition Prealbumin 12.0 (3/3)  Plan:  Will increase time tube is clamped, and stay with fulls at least till he has another BM.  Decide on when to pull JP, and when to stop antibiotics after DR. Daphine Deutscher see's him. NS  i will give him a liter bolus today, check weight, watch BP, recheck labs.   LOS: 25 days    [name] 08/11/2011

## 2011-08-11 NOTE — Progress Notes (Addendum)
Nutrition Follow-up  Diet Order: Full liquid  TNA: Clinimix E 5/15 @ 81 ml/hr.  Lipids (20% IVFE @ 10 ml/hr), multivitamins, and trace elements are provided 3 times weekly (MWF) due to national backorder.  Provides 1586 kcal and 97 grams protein daily (based on weekly average).  Meets 88% minimum estimated kcal and 97% minimum estimated protein needs.  Additional IVF with D5 NS with KCl 23mEq/L @ 30 ml/hr.  - POD # 14 exploratory laparotomy. Day 11 of TNA. Second trial of G tube clamping started 3/16, had BM 3/17. Pt reports doing well with full liquid diet, interested in getting [employer], will order. Pt denies any nausea, no BM so far today. Pt working on tray during visit, noted pt ate 60% of full liquid lunch yesterday. PALB continues to improve, was 22 on 3/10 and 29.1 on 3/17.   Meds: Scheduled Meds:   . heparin  5,000 Units Subcutaneous Q8H  . insulin aspart  0-9 Units Subcutaneous Q8H  . lip balm  1 application Topical BID  . piperacillin-tazobactam (ZOSYN)  IV  3.375 g Intravenous Q8H  . sodium chloride  1,000 mL Intravenous Once  . sodium chloride  10-40 mL Intracatheter Q12H  . sodium chloride  3 mL Intravenous Q12H   Continuous Infusions:   . dextrose 5 % and 0.9 % NaCl with KCl 20 mEq/L 30 mL/hr at 08/10/11 1400  . fat emulsion 240 mL (08/10/11 1808)  . TPN (CLINIMIX) +/- additives 90 mL/hr at 08/09/11 1704  . TPN (CLINIMIX) +/- additives    . TPN (CLINIMIX) +/- additives 81 mL/hr at 08/10/11 1808   PRN Meds:.sodium chloride, acetaminophen, acetaminophen, acetaminophen, chlorproMAZINE (THORAZINE) IV, diphenhydrAMINE, guaiFENesin, HYDROmorphone (DILAUDID) injection, magic mouthwash, menthol-cetylpyridinium, ondansetron (ZOFRAN) IV, oxyCODONE, oxyCODONE-acetaminophen, promethazine, sodium chloride, sodium chloride  Labs:  CMP     Component Value Date/Time   NA 133* 08/10/2011 0530   K 3.9 08/10/2011 0530   CL 100 08/10/2011 0530   CO2 27 08/10/2011 0530   GLUCOSE 95  08/10/2011 0530   BUN 15 08/10/2011 0530   CREATININE 0.79 08/10/2011 0530   CALCIUM 8.5 08/10/2011 0530   PROT 5.7* 08/10/2011 0530   ALBUMIN 2.5* 08/10/2011 0530   AST 15 08/10/2011 0530   ALT 20 08/10/2011 0530   ALKPHOS 68 08/10/2011 0530   BILITOT 0.2* 08/10/2011 0530   GFRNONAA >90 08/10/2011 0530   GFRAA >90 08/10/2011 0530   CBG (last 3)   Basename 08/11/11 0617 08/10/11 2300 08/10/11 1502  GLUCAP 108* 83 111*    - Noted low sodium - Albumin stable and not likely to improve soon r/t inflammatory process of surgery - CBGs <150, well controlled    Intake/Output Summary (Last 24 hours) at 08/11/11 1150 Last data filed at 08/11/11 0819  Gross per 24 hour  Intake   1710 ml  Output   2600 ml  Net   -890 ml   G-tube - total for yesterday Last BM - 08/08/11  Weight Status: No new weights  Estimated needs: 1800-2100 calories, 100-120g protein  Nutrition Dx: Inadequate oral intake - ongoing  Goal: TNA to meet >90% of pt's estimated nutritional needs - Met as closely as possible with premixed formula, goal rate is currently providing average of 88% of estimated needs.   Intervention: TNA per pharmacy. Hopefully as pt's meal intake improves, TNA can be weaned and diet advanced. Recommend re-weigh pt as last weight recorded was on 07/17/11. Resource Breeze BID.   Monitor: Weights, labs,  diet tolerance/advancement, TNA, BM   Marshall Cork Pager #: [phone]

## 2011-08-12 LAB — BASIC METABOLIC PANEL
CO2: 28 mEq/L (ref 19–32)
Chloride: 102 mEq/L (ref 96–112)
Creatinine, Ser: 0.76 mg/dL (ref 0.50–1.35)
GFR calc Af Amer: >90 mL/min (ref >90)
Potassium: 4 mEq/L (ref 3.5–5.1)
Sodium: 137 mEq/L (ref 135–145)

## 2011-08-12 LAB — CBC
MCV: 85.5 fL (ref 78.0–100.0)
Platelets: 347 10*3/uL (ref 150–400)
RBC: 3.65 MIL/uL — ABNORMAL LOW (ref 4.22–5.81)
WBC: 4.5 10*3/uL (ref 4.0–10.5)

## 2011-08-12 LAB — GLUCOSE, CAPILLARY: Glucose-Capillary: 81 mg/dL (ref 70–99)

## 2011-08-12 LAB — MAGNESIUM: Magnesium: 2.1 mg/dL (ref 1.5–2.5)

## 2011-08-12 MED ORDER — POLYETHYLENE GLYCOL 3350 17 G PO PACK
17.0000 g | PACK | Freq: Every day | ORAL | Status: DC
  Administered 2011-08-12 – 2011-08-13 (×2): 17 g via ORAL
  Filled 2011-08-12 (×3): qty 1

## 2011-08-12 MED ORDER — FAT EMULSION 20 % IV EMUL
240.0000 mL | INTRAVENOUS | Status: AC
  Administered 2011-08-12: 240 mL via INTRAVENOUS
  Filled 2011-08-12: qty 250

## 2011-08-12 MED ORDER — TRACE MINERALS CR-CU-MN-SE-ZN 10-1000-500-60 MCG/ML IV SOLN
INTRAVENOUS | Status: AC
  Administered 2011-08-12: 18:00:00 via INTRAVENOUS
  Filled 2011-08-12: qty 2000

## 2011-08-12 NOTE — Progress Notes (Signed)
15 Days Post-Op  Subjective: Labs normal, BP got somewhat better, but is back in 90's,  I/O=3541/2100, says he's eating but still no BM  Objective: Vital signs in last 24 hours: Temp:  [97.4 F (36.3 C)-98.1 F (36.7 C)] 97.7 F (36.5 C) (03/20 1400) Pulse Rate:  [75-100] 100  (03/20 1400) Resp:  [18-20] 20  (03/20 1400) BP: (90-106)/(54-71) 93/55 mmHg (03/20 1400) SpO2:  [98 %-100 %] 100 % (03/20 1400) Last BM Date: 08/08/11  Intake/Output from previous day: 03/19 0701 - 03/20 0700 In: 3541.2 [I.V.:720; IV Piggyback:600; TPN:2221.2] Out: 2100 [Urine:1950; Drains:150] Intake/Output this shift: Total I/O In: 120 [P.O.:120] Out: 2050 [Urine:2050]  General appearance: alert, cooperative and no distress Resp: clear to auscultation bilaterally GI: no distension, not tender +BS  Lab Results:   Basename 08/12/11 0500 08/10/11 0530  WBC 4.5 4.6  HGB 10.2* 10.4*  HCT 31.2* 31.1*  PLT 347 400    BMET  Basename 08/12/11 0500 08/10/11 0530  NA 137 133*  K 4.0 3.9  CL 102 100  CO2 28 27  GLUCOSE 92 95  BUN 15 15  CREATININE 0.76 0.79  CALCIUM 8.7 8.5   PT/INR No results found for this basename: LABPROT:2,INR:2 in the last 72 hours   Lab 08/10/11 0530 08/07/11 0845 08/06/11 0605  AST 15 19 16   ALT 20 31 35  ALKPHOS 68 91 103  BILITOT 0.2* 0.3 0.2*  PROT 5.7* 6.0 6.0  ALBUMIN 2.5* 2.6* 2.7*     Lipase  No results found for this basename: lipase     Studies/Results: No results found.  Medications:    . feeding supplement  1 Container Oral BID BM  . heparin  5,000 Units Subcutaneous Q8H  . insulin aspart  0-9 Units Subcutaneous Q8H  . lip balm  1 application Topical BID  . piperacillin-tazobactam (ZOSYN)  IV  3.375 g Intravenous Q8H  . sodium chloride  1,000 mL Intravenous Once  . sodium chloride  10-40 mL Intracatheter Q12H  . sodium chloride  3 mL Intravenous Q12H    Assessment/Plan SBO s/p ex lap, SBR 07/18/11 Dr. Johna Sheriff POD 23  S/p repeat ex  lap, open gastrostomy, drainage of abscess 07/28/11 Dr. Johna Sheriff POD 13  Ileus -- Continue NPO for now. Suspect flatus in next 24h given good BS. Started clamping trials yesterday  ID -- Zosyn D#4 empiric for intraabdominal abscess. JP in place. WBC decreased to normal, afebrile.  Hiccups -- Improved on thorazine.  Hyponatremia -- Decrease IVF, add NaCl tabs.  Hypokalemia -- Improved  FEN -- TPN  VTE -- SQH  Malnutrition Prealbumin 12.0 (3/3)    Plan:  I will try some miralax, he's on regular diet, start calorie count to see how much he's actually taking in.     LOS: 26 days    Blaze Sandin 08/12/2011

## 2011-08-12 NOTE — Progress Notes (Signed)
PARENTERAL NUTRITION CONSULT NOTE - FOLLOW UP  Pharmacy Consult for TNA Indication: Postoperative Ileus - s/p lysis of adhesions, enteroenterostomy 2/23; s/p exploratory laparotomy with drainage of intra-abdominal abscess and gastrostomy tube placement 3/5.  No Known Allergies  Patient Measurements: Height: 5\' 7"  (170.2 cm) Weight: 114 lb (51.71 kg) IBW/kg (Calculated) : 66.1   Vital Signs: Temp: 98.1 F (36.7 C) (03/20 0510) Temp src: Oral (03/20 0510) BP: 101/64 mmHg (03/20 0510) Pulse Rate: 75  (03/20 0510)  Intake/Output from previous day: 03/19 0701 - 03/20 0700 In: 3541.2 [I.V.:720; IV Piggyback:600; TPN:2221.2] Out: 2100 [Urine:1950; Drains:150]  Labs:  Basename 08/12/11 0500 08/10/11 0530  WBC 4.5 4.6  HGB 10.2* 10.4*  HCT 31.2* 31.1*  PLT 347 400  APTT -- --  INR -- --     Basename 08/12/11 0500 08/10/11 0530  NA 137 133*  K 4.0 3.9  CL 102 100  CO2 28 27  GLUCOSE 92 95  BUN 15 15  CREATININE 0.76 0.79  LABCREA -- --  CREAT24HRUR -- --  CALCIUM 8.7 8.5  MG 2.1 2.1  PHOS -- 3.5  PROT -- 5.7*  ALBUMIN -- 2.5*  AST -- 15  ALT -- 20  ALKPHOS -- 68  BILITOT -- 0.2*  BILIDIR -- --  IBILI -- --  PREALBUMIN -- 29.1  TRIG -- 41  CHOLHDL -- --  CHOL -- 80   Estimated Creatinine Clearance: 77.2 ml/min (by C-G formula based on Cr of 0.76).    Basename 08/11/11 2204 08/11/11 1403 08/11/11 0617  GLUCAP 99 117* 108*   Insulin Requirements in the past 24 hours:  None  Medications:  Scheduled:     . feeding supplement  1 Container Oral BID BM  . heparin  5,000 Units Subcutaneous Q8H  . insulin aspart  0-9 Units Subcutaneous Q8H  . lip balm  1 application Topical BID  . piperacillin-tazobactam (ZOSYN)  IV  3.375 g Intravenous Q8H  . sodium chloride  1,000 mL Intravenous Once  . sodium chloride  10-40 mL Intracatheter Q12H  . sodium chloride  3 mL Intravenous Q12H   Infusions:     . dextrose 5 % and 0.9 % NaCl with KCl 20 mEq/L 30 mL/hr at  08/10/11 1400  . fat emulsion 240 mL (08/10/11 1808)  . TPN (CLINIMIX) +/- additives 81 mL/hr at 08/11/11 1827  . TPN (CLINIMIX) +/- additives 81 mL/hr at 08/10/11 1808     Current Nutrition:  TNA (Clinimix E 5/15) at 81 ml/hr  Lipids 20% at 37mL/hr on MWF Increased to Reg diet 3/19 (from Reg Diet)  IVF: D5-NS + KCl 26mEq/L at 30 mL/hr  Nutritional Goals:   Per RD re-assessment 3/15: 1800-2100 kCal/d, Protein: 100-120 grams/d, Fluid: 1.8-2.1L  Old goal of Clinimix 5/15 at 80 ml/hr and lipid at 10 ml/hr MWF provides 96 g of protein and 1363 Kcal/day on non lipid days, 1843 Kcal/day on lipid days and average of 1569 Kcal/day from Sun-Saturday.   New goal (based on higher protein goal 3/15): Clinimix E 5/15 at 41ml/hr and lipid at 10 ml/hr MWF provides 108g Protein and 1533 kcal on non-lipid days, 2013 kcal on lipid days, avg 1739 kcals. 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   POD# 15 s/p exploratory laparotomy with drainage of intra-abdominal abscess and placement of gastrostomy tube  on 3/5.  TNA started 3/4  Prealbumin improved from 12 at baseline to 22 on 3/11. Further improvement to 29.1 on 3/18.  Lytes wnl   CBGs well-controlled (<150).   Previously intolerant of full liquids (abdominal pain and bloating), 480 ml PO intake recorded 3/17, diet advanced to full liquids 3/17 am. Trial of G-tube clamping started 3/18, tolerated, increased clamp time 3/19. Diet advanced to regular 3/19.  Plan:   Continue Clinimix E 5/15 at 81 ml/hr  Fat emulsion, MVI and trace elements only on MWF due to ongoing shortage  Await toleration of diet to wean TNA  Gwen Her PharmD  [phone] 08/12/2011 7:26 AM

## 2011-08-13 LAB — GLUCOSE, CAPILLARY: Glucose-Capillary: 99 mg/dL (ref 70–99)

## 2011-08-13 LAB — MAGNESIUM: Magnesium: 2 mg/dL (ref 1.5–2.5)

## 2011-08-13 LAB — COMPREHENSIVE METABOLIC PANEL
AST: 18 U/L (ref 0–37)
Albumin: 2.8 g/dL — ABNORMAL LOW (ref 3.5–5.2)
BUN: 14 mg/dL (ref 6–23)
Calcium: 8.6 mg/dL (ref 8.4–10.5)
Creatinine, Ser: 0.71 mg/dL (ref 0.50–1.35)

## 2011-08-13 LAB — PHOSPHORUS: Phosphorus: 3.1 mg/dL (ref 2.3–4.6)

## 2011-08-13 MED ORDER — LIP MEDEX EX OINT
TOPICAL_OINTMENT | CUTANEOUS | Status: AC
  Administered 2011-08-13: 1
  Filled 2011-08-13: qty 7

## 2011-08-13 NOTE — Progress Notes (Signed)
Notified MD concerning patient's request for something to stop diarrhea, MD did not want to order anything because the patient had not had a bowel movement in 6 days Means, [name] N 08-13-11 13:40pm

## 2011-08-13 NOTE — Progress Notes (Signed)
PARENTERAL NUTRITION CONSULT NOTE - FOLLOW UP  Pharmacy Consult for TNA Indication: Postoperative Ileus - s/p lysis of adhesions, enteroenterostomy 2/23; s/p exploratory laparotomy with drainage of intra-abdominal abscess and gastrostomy tube placement 3/5.  No Known Allergies  Patient Measurements: Height: 5\' 7"  (170.2 cm) Weight: 114 lb (51.71 kg) IBW/kg (Calculated) : 66.1   Vital Signs: Temp: 97.8 F (36.6 C) (03/21 0846) Temp src: Oral (03/21 0846) BP: 98/65 mmHg (03/21 0846) Pulse Rate: 76  (03/21 0846)  Intake/Output from previous day: 03/20 0701 - 03/21 0700 In: 2212 [P.O.:840; I.V.:240; IV Piggyback:50; TPN:1082] Out: 4085 [Urine:3925; Drains:160]  Labs:  Basename 08/12/11 0500  WBC 4.5  HGB 10.2*  HCT 31.2*  PLT 347  APTT --  INR --     Basename 08/13/11 0620 08/12/11 0500  NA 135 137  K 3.6 4.0  CL 100 102  CO2 27 28  GLUCOSE 88 92  BUN 14 15  CREATININE 0.71 0.76  LABCREA -- --  CREAT24HRUR -- --  CALCIUM 8.6 8.7  MG 2.0 2.1  PHOS 3.1 --  PROT 5.8* --  ALBUMIN 2.8* --  AST 18 --  ALT 18 --  ALKPHOS 63 --  BILITOT 0.2* --  BILIDIR -- --  IBILI -- --  PREALBUMIN -- --  TRIG -- --  CHOLHDL -- --  CHOL -- --   Estimated Creatinine Clearance: 77.2 ml/min (by C-G formula based on Cr of 0.71).    Basename 08/12/11 1325 08/11/11 2204 08/11/11 1403  GLUCAP 81 99 117*   Medications:  Scheduled:     . feeding supplement  1 Container Oral BID BM  . heparin  5,000 Units Subcutaneous Q8H  . insulin aspart  0-9 Units Subcutaneous Q8H  . lip balm  1 application Topical BID  . piperacillin-tazobactam (ZOSYN)  IV  3.375 g Intravenous Q8H  . polyethylene glycol  17 g Oral Daily  . sodium chloride  10-40 mL Intracatheter Q12H  . sodium chloride  3 mL Intravenous Q12H   Infusions:     . dextrose 5 % and 0.9 % NaCl with KCl 20 mEq/L 30 mL/hr at 08/10/11 1400  . fat emulsion 240 mL (08/12/11 1749)  . TPN (CLINIMIX) +/- additives 81 mL/hr at  08/11/11 1827  . TPN (CLINIMIX) +/- additives 81 mL/hr at 08/12/11 1749    Insulin Requirements in the past 24 hours:  None  Current Nutrition:  TNA (Clinimix E 5/15) at 81 ml/hr  Lipids 20% at 46mL/hr on MWF Increased to Reg diet 3/19 - Good appetite reported IVF: D5-NS + KCl 79mEq/L at 30 mL/hr  Nutritional Goals:   Per RD re-assessment 3/15: 1800-2100 kCal/d, Protein: 100-120 grams/d, Fluid: 1.8-2.1L  Old goal of Clinimix 5/15 at 80 ml/hr and lipid at 10 ml/hr MWF provides 96 g of protein and 1363 Kcal/day on non lipid days, 1843 Kcal/day on lipid days and average of 1569 Kcal/day from Sun-Saturday.   New goal (based on higher protein goal 3/15): Clinimix E 5/15 at 15ml/hr and lipid at 10 ml/hr MWF provides 108g Protein and 1533 kcal on non-lipid days, 2013 kcal on lipid days, avg 1739 kcals. 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   POD# 15 s/p exploratory laparotomy with drainage of intra-abdominal abscess and placement of gastrostomy tube on 3/5.  TNA started 3/4  Prealbumin  improved from 12 at baseline to 22 on 3/11. Further improvement to 29.1 on 3/18.  Lytes wnl   CBGs well-controlled (<150).   Previously intolerant of full liquids (abdominal pain and bloating), 480 ml PO intake recorded 3/17, diet advanced to full liquids 3/17 am. Trial of G-tube clamping started 3/18, tolerated, increased clamp time 3/19. Diet advanced to regular 3/19, now with good appetite and 100% meals recorded from 3/20  Plan:   Per MD orders will wean TNA to 50% today and then turn off tonight at 1800.  Decrease TNA to 40 ml/hr and discontinue TNA tonight at 1800.  Fat emulsion, MVI and trace elements only on MWF due to ongoing shortage  [name], Loma Messing PharmD 10:04 AM 08/13/2011

## 2011-08-13 NOTE — Progress Notes (Signed)
Patient ID: [name], male   DOB: [birth year], 56 y.o.   MRN: [id number] [name] 56 y.o.  Body mass index is 17.85 kg/(m^2).  Patient Active Problem List  Diagnoses  . ANXIETY  . PSYCHIATRIC DISORDER  . DEPRESSION  . PUD  . GI BLEEDING  . ARTHRITIS  . SCOLIOSIS  . IMPERFORATE ANUS  . FECAL INCONTINENCE  . INGUINAL HERNIA, HX OF  . FATIGUE  . DYSPNEA  . Obstipation  . Nausea  . Ileus following gastrointestinal surgery  . SBO (small bowel obstruction)    No Known Allergies  Past Surgical History  Procedure Date  . Left knee surgery 1994, 1992  . Hernia repair 1980    left inguinal hernia  . Multiple cervical fusions   . Surgery for imperforate anus 1958  . Upper gastrointestinal endoscopy 05/15/03  . Colonoscopy 11/08/2006  . Laparotomy 07/18/2011    Procedure: EXPLORATORY LAPAROTOMY;  Surgeon: Mariella Saa, MD;  Location: [facility];  Service: General;  Laterality: N/A;  lysis of adhesions entero enterostomy   [name], MD, MD 1. Small bowel obstruction     Mr. Schertzer was not as appendectomy this morning. He was complaining about constipation and only got his first dose of MiraLAX yesterday. He is due for MiraLAX again today. He still receiving T&A and we have advanced his diet. Matt B. Daphine Deutscher, MD, [facility] [phone] beeper [phone]  08/13/2011 8:39 AM

## 2011-08-13 NOTE — Progress Notes (Signed)
Paged CCS MD on call about patients request again for something to stop diarrhea, no new orders given, patient extremely upset, patient had not had a bowel movement in 6 days prior to the diarrhea today, will attempt to explain situation to patient again  Means, [name] N 08-13-11 19:37

## 2011-08-14 LAB — GLUCOSE, CAPILLARY: Glucose-Capillary: 89 mg/dL (ref 70–99)

## 2011-08-14 MED ORDER — LOPERAMIDE HCL 2 MG PO CAPS
2.0000 mg | ORAL_CAPSULE | Freq: Four times a day (QID) | ORAL | Status: DC | PRN
  Administered 2011-08-14 (×2): 2 mg via ORAL
  Filled 2011-08-14 (×3): qty 1

## 2011-08-14 MED ORDER — OXYCODONE-ACETAMINOPHEN 5-325 MG PO TABS: 1.0000 | ORAL_TABLET | ORAL | Status: DC | PRN

## 2011-08-14 MED ORDER — ACETAMINOPHEN 325 MG PO TABS: 650.0000 mg | ORAL_TABLET | Freq: Four times a day (QID) | ORAL | Status: DC | PRN

## 2011-08-14 NOTE — Progress Notes (Signed)
17 Days Post-Op  Subjective: He is really upset about diarrhea now, 2 recorded he says more like 7-9.  Apparently he's had trouble with this before his obstruction. H&P say he complained of constipation his whole life, so perhaps he's had both. He' s not waking due to constipation.  Objective: Vital signs in last 24 hours: Temp:  [97.8 F (36.6 C)-98.1 F (36.7 C)] 98.1 F (36.7 C) (03/22 0614) Pulse Rate:  [75-86] 75  (03/22 0614) Resp:  [16-18] 16  (03/22 0614) BP: (94-103)/(59-65) 103/59 mmHg (03/22 0614) SpO2:  [94 %-100 %] 94 % (03/22 0614) Last BM Date: 08/14/11 Now having loose stools, 2 reported, VSS, Labs OK yesterday  Intake/Output from previous day:   03/21 0701 - 03/22 0700 In: 1430 [P.O.:480; I.V.:570; TPN:350] Out: 625 [Urine:625] Intake/Output this shift:    General appearance: alert, cooperative, cachectic and very upset and anxious over diarrhea. GI: G tube clamped, incision looks good, Drain shows clear fluid.  Lab Results:   Basename 08/12/11 0500  WBC 4.5  HGB 10.2*  HCT 31.2*  PLT 347    BMET  Basename 08/13/11 0620 08/12/11 0500  NA 135 137  K 3.6 4.0  CL 100 102  CO2 27 28  GLUCOSE 88 92  BUN 14 15  CREATININE 0.71 0.76  CALCIUM 8.6 8.7   PT/INR No results found for this basename: LABPROT:2,INR:2 in the last 72 hours   Lab 08/13/11 0620 08/10/11 0530  AST 18 15  ALT 18 20  ALKPHOS 63 68  BILITOT 0.2* 0.2*  PROT 5.8* 5.7*  ALBUMIN 2.8* 2.5*     Lipase  No results found for this basename: lipase     Studies/Results: No results found.  Medications:    . feeding supplement  1 Container Oral BID BM  . heparin  5,000 Units Subcutaneous Q8H  . insulin aspart  0-9 Units Subcutaneous Q8H  . lip balm  1 application Topical BID  . lip balm      . piperacillin-tazobactam (ZOSYN)  IV  3.375 g Intravenous Q8H  . polyethylene glycol  17 g Oral Daily  . sodium chloride  10-40 mL Intracatheter Q12H  . sodium chloride  3 mL  Intravenous Q12H    Assessment/Plan SBO s/p ex lap, SBR 07/18/11 Dr. Johna Sheriff POD 23  S/p repeat ex lap, open gastrostomy, drainage of abscess 07/28/11 Dr. Johna Sheriff POD 13  Ileus -- Continue NPO for now. Suspect flatus in next 24h given good BS. Started clamping trials yesterday  ID -- Zosyn D#4 empiric for intraabdominal abscess. JP in place. WBC decreased to normal, afebrile.  Hiccups -- Improved on thorazine.  Hyponatremia -- Decrease IVF, add NaCl tabs.  Hypokalemia -- Improved  FEN -- TPN  VTE -- SQH  Malnutrition Prealbumin 12.0 (3/3)   Plan:  Stop zosyn.  He's been on it since his second surgery 3/5, add some imodium, because he's not going to eat until we do something. i ask for a calorie count, and it looks like his TNA is off.  I will see if we can pull his drain today also.    LOS: 28 days    Afton Mikelson 08/14/2011

## 2011-08-14 NOTE — Progress Notes (Signed)
Pt  Is no longer on TPN and would like to have his CBG's d/c'd if possible.

## 2011-08-14 NOTE — Progress Notes (Signed)
Spoke to PA Hamlin concerning patients request to have something for diarrhea, no new orders given Means, [name] N 01-14-12 8:57am

## 2011-08-14 NOTE — Progress Notes (Signed)
Nutrition Brief Note  - Received consult for calorie count x 48 hours. Labeled sign with instructions and placed on door - discussed with nursing and pt. Reviewed importance of documenting percent intake of all foods on meal tickets and percent intake of supplements and placing results in envelope on door. RD to monitor and analyze results.   Dietitian # [phone]

## 2011-08-15 NOTE — Progress Notes (Signed)
Patient ID: [name], male   DOB: June 19, 1955, 56 y.o.   MRN: [id number]  General Surgery - [facility] - Progress Note  POD# 18   Subjective: Patient seated in bed.  Looking forward to breakfast.  No diarrhea.  Objective: Vital signs in last 24 hours: Temp:  [97.2 F (36.2 C)-98.3 F (36.8 C)] 97.5 F (36.4 C) (03/23 0618) Pulse Rate:  [71-85] 71  (03/23 0618) Resp:  [18-20] 20  (03/23 0618) BP: (98-103)/(57-72) 99/61 mmHg (03/23 0618) SpO2:  [95 %-100 %] 99 % (03/23 0618) Last BM Date: 08/14/11  Intake/Output from previous day: 03/22 0701 - 03/23 0700 In: 1275 [P.O.:900; I.V.:360] Out: 490 [Urine:300; Drains:190]  Exam: HEENT - clear, not icteric Neck - soft Chest - clear bilaterally Cor - RRR, no murmur Abd - soft without distension; normal BS; positive flatus; no BM; drain with small serous; G tube capped Ext - no significant edema Neuro - grossly intact, no focal deficits  Lab Results:  No results found for this basename: WBC:2,HGB:2,HCT:2,PLT:2 in the last 72 hours   Basename 08/13/11 0620  NA 135  K 3.6  CL 100  CO2 27  GLUCOSE 88  BUN 14  CREATININE 0.71  CALCIUM 8.6    Studies/Results: No results found.  Assessment: Resolving ileus  Plan: Advance diet as tolerated - calorie count in progress Needs to ambulate - may need PT consult Wound care   [name], MD, [facility] Office: [phone]  08/15/2011

## 2011-08-15 NOTE — Progress Notes (Signed)
PCR discontinued per protocol tested 3/22 negative. Annitta Needs, RN

## 2011-08-16 LAB — GLUCOSE, CAPILLARY

## 2011-08-16 MED ORDER — DOCUSATE SODIUM 50 MG PO CAPS
50.0000 mg | ORAL_CAPSULE | Freq: Every day | ORAL | Status: DC
  Administered 2011-08-16 – 2011-08-17 (×2): 50 mg via ORAL
  Filled 2011-08-16 (×3): qty 1

## 2011-08-16 NOTE — Progress Notes (Signed)
Patient ID: [name], male   DOB: [birth year], 56 y.o.   MRN: [id number]  General Surgery - [facility] - Progress Note  POD# 19  Subjective: Patient without complaints overnight.  G-tube remains clamped.  Taking po diet - calorie counts in progress.  No nausea or emesis.  Objective: Vital signs in last 24 hours: Temp:  [97.6 F (36.4 C)-98.5 F (36.9 C)] 97.6 F (36.4 C) (03/24 0549) Pulse Rate:  [65-72] 70  (03/24 0549) Resp:  [16-18] 16  (03/24 0549) BP: (89-117)/(58-81) 97/64 mmHg (03/24 0549) SpO2:  [97 %-100 %] 97 % (03/24 0549) Last BM Date: 08/14/11  Intake/Output from previous day: 03/23 0701 - 03/24 0700 In: 981 [P.O.:240; I.V.:726] Out: 878 [Urine:625; Drains:253]  Exam: HEENT - clear, not icteric Neck - soft Chest - clear bilaterally Cor - RRR, no murmur Abd - soft without distension; G-tube LUQ; dressing intact; active BS Ext - no significant edema Neuro - grossly intact, no focal deficits  Lab Results:  No results found for this basename: WBC:2,HGB:2,HCT:2,PLT:2 in the last 72 hours  No results found for this basename: NA:2,K:2,CL:2,CO2:2,GLUCOSE:2,BUN:2,CREATININE:2,CALCIUM:2 in the last 72 hours  Studies/Results: No results found.  Assessment: Resolving ileus  Plan: Calorie counts Daily weights OOB, ambulate Pt requests stool softener - hx of chronic constipation   [name], MD, [facility] Office: [phone]  08/16/2011

## 2011-08-17 NOTE — Progress Notes (Addendum)
20 Days Post-Op  Subjective: 5ml from drain yesterday, 3 ml 3/23, I/O 1003/1380, No BM for 3 days, now. Doesn't want to try anything else for now.  Gastrostomy tube clamped. On [place] cound  Objective: Vital signs in last 24 hours: Temp:  [98 F (36.7 C)-98.4 F (36.9 C)] 98 F (36.7 C) (03/25 1191) Pulse Rate:  [68-75] 68  (03/25 0632) Resp:  [16-18] 16  (03/25 0632) BP: (108-115)/(68-77) 114/68 mmHg (03/25 0632) SpO2:  [100 %] 100 % (03/25 4782) Weight:  [52.028 kg (114 lb 11.2 oz)] 52.028 kg (114 lb 11.2 oz) (03/24 1854) Last BM Date: 08/14/11 No labs, AFebrile, VSS Intake/Output from previous day: 03/24 0701 - 03/25 0700 In: 1003 [I.V.:1003] Out: 1380 [Urine:1275; Drains:105] Intake/Output this shift: Total I/O In: -  Out: 400 [Urine:400]  General appearance: alert, cooperative, cachectic and no distress Resp: clear to auscultation bilaterally GI: +BS, not tender or distended, Open wound healing nicely with wet to dry dressing.  Lab Results:  No results found for this basename: WBC:2,HGB:2,HCT:2,PLT:2 in the last 72 hours  BMET No results found for this basename: NA:2,K:2,CL:2,CO2:2,GLUCOSE:2,BUN:2,CREATININE:2,CALCIUM:2 in the last 72 hours PT/INR No results found for this basename: LABPROT:2,INR:2 in the last 72 hours   Lab 08/13/11 0620  AST 18  ALT 18  ALKPHOS 63  BILITOT 0.2*  PROT 5.8*  ALBUMIN 2.8*     Lipase  No results found for this basename: lipase     Studies/Results: No results found.  Medications:    . docusate sodium  50 mg Oral Daily  . feeding supplement  1 Container Oral BID BM  . heparin  5,000 Units Subcutaneous Q8H  . lip balm  1 application Topical BID  . sodium chloride  10-40 mL Intracatheter Q12H  . sodium chloride  3 mL Intravenous Q12H    Assessment/Plan SBO s/p ex lap, SBR 07/18/11 Dr. Johna Sheriff POD 23  S/p repeat ex lap, open gastrostomy, drainage of abscess 07/28/11 Dr. Johna Sheriff POD 13  Ileus --Now taking PO's,  diarrhea with negative PCR for C. Diff.  Hx of chronic loose stools prior to surgery. Now no BM for 3 day.  Does not want anything but colace right now. ID -- Zosyn D#4 empiric for intraabdominal abscess. JP in place. WBC decreased to normal, afebrile.  Hiccups -- Improved on thorazine.  Hyponatremia -- Decrease IVF, add NaCl tabs.  Hypokalemia -- Improved  FEN -- TPN  VTE -- SQH  Malnutrition Prealbumin 12.0 (3/3)  Plan:  He is off Antibiotics, taking a regular diet, He does not want home health to help with dressing, or Home PT.   I will recheck labs in AM.  Ask about pulling drain, consider when to discharge. Hope for another BM today.    C/O vicodin made his stomach hurt.  Not listed as a medicine.  No BM yet he is up in chair so I will get drain out in AM.  LOS: 31 days    [name] 08/17/2011

## 2011-08-17 NOTE — Discharge Summary (Signed)
Physician Discharge Summary  Patient ID: [name] MRN: [id number] DOB/AGE: July 03, 1955 56 y.o.  Admit date: 07/17/2011 Discharge date: 08/25/2011  Admission Diagnoses: 1. PSBO, mechanical vs secondary to obstipation  2. Chronic constipation/IMPERFORATE ANUS repair at age;  1 day old  3. Scoliosis  4. H/o imperforate anus 5. ANXIETY/ PSYCHIATRIC DISORDER/ DEPRESSION 6. Hx. PUD/GI Bleed 7.Hx.INGUINAL HERNIA 8.Hx.FATIGUE        Discharge Diagnoses: Small bowel obstruction  Chronic constipation/IMPERFORATE ANUS repair at age;  1 day old  ANXIETY/ PSYCHIATRIC DISORDER/ DEPRESSION  Hx. PUD/GI Bleed Principal Problem:  *SBO (small bowel obstruction) Active Problems:  Ileus following gastrointestinal surgery  Malnutrition  Postoperative intra-abdominal abscess   PROCEDURES: 1.   EXPLORATORY LAPAROTOMY and small bowel bypass 07/18/11 2. Exploratory laparotomy with drainage of intra-abdominal abscess and placement of gastrostomy tube 07/28/11.   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 Because of this problem he has suffered from severe constipation his whole life. 5 days ago he developed abdominal distention, nausea, and vomiting. He has not had a BM in 5 days. He spoke to his PCP who recommended the patient do an enema at home. Apparently this did not help and the patient presented to the [facility] today for further evaluation. He was found to have severe constipation and a PSBO on x-ray. He was admitted and placed on NG suction, IV hydration without resolution of his SBO. He was taken to the OR on 07/18/11, as noted above.  He had a prolonged ileus with high NG output which did not resolve. On 3/4 he noted a very bad night with increased pain and a marked rise in his WBC. A CT scan obtained the following day showed an abscesss in the LUQ.  He was returned to the OR that evening with the above noted findings and  procedure. He was evaluated and started on TNA 07/27/11.  He was maintained on TNA, IV antibiotics thru 08/13/11.  He was finally able to tolerate clamping of his gastrostomy tube and has slowly had his diet advanced.  He developed some diarrhea, after bowel function resumed, then stopped having BM's again even though he was eating.  Calorie counts were incomplete, but he was taking about 988 calories, and 72 % of his protein needs. His prealbumin has increased from 12 to 29 while on TNA. OT/PT were both offered and he eventually refused these and said he was walking well without help.    His bowel function finally returned to normal and he was discharged home 08/20/11, after being seen by DR. Byerly.  Condition on D/C: Improving CBC    Component Value Date/Time   WBC 5.0 08/18/2011 0938   RBC 3.89* 08/18/2011 0938   HGB 11.1* 08/18/2011 0938   HCT 33.0* 08/18/2011 0938   PLT 226 08/18/2011 0938   MCV 84.8 08/18/2011 0938   MCH 28.5 08/18/2011 0938   MCHC 33.6 08/18/2011 0938   RDW 14.7 08/18/2011 0938   LYMPHSABS 1.0 08/10/2011 0530   MONOABS 0.4 08/10/2011 0530   EOSABS 0.4 08/10/2011 0530   BASOSABS 0.1 08/10/2011 0530   CMP     Component Value Date/Time   NA 135 08/18/2011 0938   K 3.6 08/18/2011 0938   CL 101 08/18/2011 0938   CO2 27 08/18/2011 0938   GLUCOSE 118* 08/18/2011 0938   BUN 9 08/18/2011 0938   CREATININE 0.79 08/18/2011 0938   CALCIUM  8.6 08/18/2011 0938   PROT 5.7* 08/18/2011 0938   ALBUMIN 2.8* 08/18/2011 0938   AST 19 08/18/2011 0938   ALT 20 08/18/2011 0938   ALKPHOS 69 08/18/2011 0938   BILITOT 0.2* 08/18/2011 0938   GFRNONAA >90 08/18/2011 0938   GFRAA >90 08/18/2011 4098      Disposition:   Discharge Orders    Future Appointments: Provider: Department: Dept Phone: Center:   08/31/2011 2:00 PM Wanda Plump, MD [facility] [phone] [facility]     Medication List  As of 08/25/2011  3:45 PM   TAKE these medications         DSS 100 MG Caps   Take 100 mg by mouth 2 (two) times  daily.      traMADol 50 MG tablet   Commonly known as: ULTRAM   Take 1-2 tablets (50-100 mg total) by mouth every 6 (six) hours as needed for pain.           Follow-up Information    Follow up with [name], MD. Schedule an appointment as soon as possible for a visit in 2 weeks.      Follow up with Glenna Fellows T, MD. Schedule an appointment as soon as possible for a visit in 3 weeks.   Contact information:   [employer] [place] [address] [place] [address] [phone]          Signed: Sherrie George 08/25/2011, 3:45 PM

## 2011-08-17 NOTE — Progress Notes (Signed)
Need to make sure pt with bowel movements again and not recurrent abscess/ileus.   Should be able to remove drain.   Will confirm with Dr. Johna Sheriff.

## 2011-08-17 NOTE — Progress Notes (Signed)
Calorie Count Results  - Not all meal tickets saved had recorded intake on them so results are incomplete, however for the tickets that were recorded, here are the results:  Dinner 3/22 - 224 calories, 3g protein Breakfast 3/23 - 352 calories, 40g protein Dinner 3/23 - 162 calories, 20g protein  - Pt reports drinking 1 [employer] daily over the weekend which would add an additional 250 calories, 9g protein to each daily total.   - Rough average of daily intake including supplements would be 988 calories, 72g protein - 55% of estimated calorie needs, 72% protein needs. Met with pt who reports he had "the best weekend I've had in over a month" and reports his appetite is voracious. Noted pt was working on breakfast tray and had consumed 100% of grits. Most of pt's meal choices over the weekend were toast/grits or chicken/vegetables. Suspect pt's intake will continue to improve as pt reports increased appetite. Will continue to monitor intake.   Dietitian # [phone]

## 2011-08-18 LAB — COMPREHENSIVE METABOLIC PANEL
BUN: 9 mg/dL (ref 6–23)
CO2: 27 mEq/L (ref 19–32)
Calcium: 8.6 mg/dL (ref 8.4–10.5)
Creatinine, Ser: 0.79 mg/dL (ref 0.50–1.35)
GFR calc Af Amer: >90 mL/min (ref >90)
GFR calc non Af Amer: >90 mL/min (ref >90)
Glucose, Bld: 118 mg/dL — ABNORMAL HIGH (ref 70–99)
Total Protein: 5.7 g/dL — ABNORMAL LOW (ref 6.0–8.3)

## 2011-08-18 LAB — CBC
HCT: 33 % — ABNORMAL LOW (ref 39.0–52.0)
Hemoglobin: 11.1 g/dL — ABNORMAL LOW (ref 13.0–17.0)
MCH: 28.5 pg (ref 26.0–34.0)
MCHC: 33.6 g/dL (ref 30.0–36.0)
MCV: 84.8 fL (ref 78.0–100.0)
RBC: 3.89 MIL/uL — ABNORMAL LOW (ref 4.22–5.81)

## 2011-08-18 LAB — MAGNESIUM: Magnesium: 1.8 mg/dL (ref 1.5–2.5)

## 2011-08-18 MED ORDER — HYDROMORPHONE HCL PF 1 MG/ML IJ SOLN
0.5000 mg | INTRAMUSCULAR | Status: DC | PRN
  Administered 2011-08-18 – 2011-08-19 (×4): 1 mg via INTRAVENOUS
  Filled 2011-08-18 (×4): qty 1

## 2011-08-18 MED ORDER — DOCUSATE SODIUM 100 MG PO CAPS
100.0000 mg | ORAL_CAPSULE | Freq: Two times a day (BID) | ORAL | Status: DC
  Administered 2011-08-18 – 2011-08-20 (×5): 100 mg via ORAL
  Filled 2011-08-18 (×6): qty 1

## 2011-08-18 MED ORDER — POLYETHYLENE GLYCOL 3350 17 G PO PACK
17.0000 g | PACK | Freq: Every day | ORAL | Status: DC | PRN
  Administered 2011-08-18: 17 g via ORAL
  Filled 2011-08-18 (×2): qty 1

## 2011-08-18 NOTE — Progress Notes (Addendum)
21 Days Post-Op  Subjective: C/O abdominal pain last Pm and thought it was due to vicodin, but he's not on Vicodin. Taking dilaudid regularly which i was not aware of.  Ate only breakfast yesterday.  Wants some Miralax now (1/2 dose.)  Objective: Vital signs in last 24 hours: Temp:  [98.7 F (37.1 C)] 98.7 F (37.1 C) (03/25 2118) Pulse Rate:  [52-77] 77  (03/25 2118) Resp:  [16-18] 18  (03/25 2118) BP: (92-137)/(61-89) 92/61 mmHg (03/25 2118) SpO2:  [98 %-100 %] 98 % (03/25 2118) Weight:  [52.481 kg (115 lb 11.2 oz)] 52.481 kg (115 lb 11.2 oz) (03/25 1035) Last BM Date: 08/14/11 Afebrile, VSS, PO intake ?, No labs Intake/Output from previous day: 03/25 0701 - 03/26 0700 In: 938 [I.V.:938] Out: 1375 [Urine:1375] Intake/Output this shift:    General appearance: alert, cooperative and no distress Resp: clear to auscultation bilaterally GI: soft, non-tender; bowel sounds normal; no masses,  no organomegaly and drain removed, abd soft, nontender, not distended.  Lab Results:  No results found for this basename: WBC:2,HGB:2,HCT:2,PLT:2 in the last 72 hours  BMET No results found for this basename: NA:2,K:2,CL:2,CO2:2,GLUCOSE:2,BUN:2,CREATININE:2,CALCIUM:2 in the last 72 hours PT/INR No results found for this basename: LABPROT:2,INR:2 in the last 72 hours   Lab 08/13/11 0620  AST 18  ALT 18  ALKPHOS 63  BILITOT 0.2*  PROT 5.8*  ALBUMIN 2.8*     Lipase  No results found for this basename: lipase     Studies/Results: No results found.  Medications:    . docusate sodium  50 mg Oral Daily  . feeding supplement  1 Container Oral BID BM  . heparin  5,000 Units Subcutaneous Q8H  . lip balm  1 application Topical BID  . sodium chloride  10-40 mL Intracatheter Q12H  . sodium chloride  3 mL Intravenous Q12H    Assessment/Plan SBO s/p ex lap, SBR 07/18/11 Dr. Johna Sheriff POD 23  S/p repeat ex lap, open gastrostomy, drainage of abscess 07/28/11 Dr. 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 PUD/GI Bleed  Principal Problem:  *SBO (small bowel obstruction)  Active Problems:  Ileus following gastrointestinal surgery  Malnutrition  Postoperative intra-abdominal abscess    I'm not sure why he's having so much pain, and I wasn't really aware of it before.  I will check labs, if he continues to have pain consider repeat CT.  Increase colace, and miralax  Labs show :   CMP     Component Value Date/Time   NA 135 08/18/2011 0938   K 3.6 08/18/2011 0938   CL 101 08/18/2011 0938   CO2 27 08/18/2011 0938   GLUCOSE 118* 08/18/2011 0938   BUN 9 08/18/2011 0938   CREATININE 0.79 08/18/2011 0938   CALCIUM 8.6 08/18/2011 0938   PROT 5.7* 08/18/2011 0938   ALBUMIN 2.8* 08/18/2011 0938   AST 19 08/18/2011 0938   ALT 20 08/18/2011 0938   ALKPHOS 69 08/18/2011 0938   BILITOT 0.2* 08/18/2011 0938   GFRNONAA >90 08/18/2011 0938   GFRAA >90 08/18/2011 0938     CBC    Component Value Date/Time   WBC 5.0 08/18/2011 0938   RBC 3.89* 08/18/2011 0938   HGB 11.1* 08/18/2011 0938   HCT 33.0* 08/18/2011 0938   PLT 226 08/18/2011 0938   MCV 84.8 08/18/2011 0938   MCH 28.5 08/18/2011 0938   MCHC 33.6 08/18/2011 0938   RDW 14.7  08/18/2011 0938   LYMPHSABS 1.0 08/10/2011 0530   MONOABS 0.4 08/10/2011 0530   EOSABS 0.4 08/10/2011 0530   BASOSABS 0.1 08/10/2011 0530     LOS: 32 days    [name] 08/18/2011

## 2011-08-18 NOTE — Progress Notes (Signed)
Decrease dilaudid. Add miralax. Home when having bowel movements.

## 2011-08-19 ENCOUNTER — Encounter (HOSPITAL_COMMUNITY): Payer: Self-pay | Admitting: General Surgery

## 2011-08-19 ENCOUNTER — Inpatient Hospital Stay (HOSPITAL_COMMUNITY): Payer: Medicaid Other

## 2011-08-19 MED ORDER — OXYCODONE-ACETAMINOPHEN 5-325 MG PO TABS: 1.0000 | ORAL_TABLET | ORAL | Status: DC | PRN

## 2011-08-19 MED ORDER — TRAMADOL HCL 50 MG PO TABS
100.0000 mg | ORAL_TABLET | Freq: Four times a day (QID) | ORAL | Status: DC | PRN
  Administered 2011-08-19 – 2011-08-20 (×3): 100 mg via ORAL
  Filled 2011-08-19: qty 2
  Filled 2011-08-19: qty 1
  Filled 2011-08-19: qty 2

## 2011-08-19 MED ORDER — BISACODYL 10 MG RE SUPP: 10.0000 mg | Freq: Every day | RECTAL | Status: DC | PRN

## 2011-08-19 MED ORDER — POLYETHYLENE GLYCOL 3350 17 G PO PACK
17.0000 g | PACK | Freq: Every day | ORAL | Status: DC | PRN
  Administered 2011-08-19: 17 g via ORAL
  Filled 2011-08-19 (×2): qty 1

## 2011-08-19 NOTE — Progress Notes (Signed)
22 Days Post-Op  Subjective: Still having pain, he describes LUQ, where drain site and gastrostomy site is.  He is not tender, WBC is normal, He has not had any fever.  Objective: Vital signs in last 24 hours: Temp:  [98 F (36.7 C)-98.8 F (37.1 C)] 98.2 F (36.8 C) (03/27 0625) Pulse Rate:  [72-80] 72  (03/27 0625) Resp:  [18-19] 18  (03/27 0625) BP: (109-121)/(69-74) 109/69 mmHg (03/27 0625) SpO2:  [96 %-100 %] 98 % (03/27 0625) Last BM Date: 08/14/11  Intake/Output from previous day: 03/26 0701 - 03/27 0700 In: 721 [I.V.:721] Out: 1250 [Urine:1250] Intake/Output this shift:    General appearance: alert, cooperative and no distress Resp: clear to auscultation bilaterally GI: soft, non-tender; bowel sounds normal; no masses,  no organomegaly and Gastrostomy site and incision have been quite clean and normal.  Open incision almost completely healed.  Lab Results:   [facility] 08/18/11 0938  WBC 5.0  HGB 11.1*  HCT 33.0*  PLT 226    BMET  Basename 08/18/11 0938  NA 135  K 3.6  CL 101  CO2 27  GLUCOSE 118*  BUN 9  CREATININE 0.79  CALCIUM 8.6   PT/INR No results found for this basename: LABPROT:2,INR:2 in the last 72 hours   Lab 08/18/11 0938 08/13/11 0620  AST 19 18  ALT 20 18  ALKPHOS 69 63  BILITOT 0.2* 0.2*  PROT 5.7* 5.8*  ALBUMIN 2.8* 2.8*     Lipase  No results found for this basename: lipase     Studies/Results: No results found.  Medications:    . docusate sodium  100 mg Oral BID  . feeding supplement  1 Container Oral BID BM  . heparin  5,000 Units Subcutaneous Q8H  . lip balm  1 application Topical BID  . sodium chloride  10-40 mL Intracatheter Q12H  . sodium chloride  3 mL Intravenous Q12H    Assessment/Plan SBO s/p ex lap, SBR 07/18/11 Dr. Johna Sheriff POD 23  S/p repeat ex lap, open gastrostomy, drainage of abscess 07/28/11 Dr. 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 PUD/GI Bleed  Principal Problem:  *SBO (small bowel obstruction)  Active Problems:  Ileus following gastrointestinal surgery  Malnutrition  Postoperative intra-abdominal abscess   Plan:  He insist pain med issue was Vicodin which wasn't ordered.  He says a whole dose of Miralax caused diarrhea last time.  I'm a bit concerned some of this may be related to anxiety states.  I will check an xray, to see if there is something going on in his chest and abd.  Increase Miralax, to full dose if he will take it, he's already on colace.  Retry him on tramadol and percocet in place of dilaudid.     LOS: 33 days    Twylla Arceneaux 08/19/2011

## 2011-08-19 NOTE — Progress Notes (Signed)
Do a full dose miralax today, and if no results, do suppository.

## 2011-08-20 MED ORDER — DSS 100 MG PO CAPS: 100.0000 mg | ORAL_CAPSULE | Freq: Two times a day (BID) | ORAL | Status: AC

## 2011-08-20 MED ORDER — TRAMADOL HCL 50 MG PO TABS: 50.0000 mg | ORAL_TABLET | Freq: Four times a day (QID) | ORAL | Status: AC | PRN

## 2011-08-20 NOTE — Progress Notes (Signed)
CARE MANAGEMENT NOTE 08/20/2011  Patient:  [name], [name]   Account Number:  [id number]  Date Initiated:  07/30/2011  Documentation initiated by:  Eilene Voigt  Subjective/Objective Assessment:   56 yo male admitted 07/17/11 with SBO, pelvic abscesses     Action/Plan:   D/C when medically stable   Anticipated DC Date:  08/20/2011   Anticipated DC Plan:  HOME W HOME HEALTH SERVICES      DC Planning Services  CM consult      [facility] Choice  HOME HEALTH   Choice offered to / List presented to:  C-1 Patient        HH arranged  HH-1 RN      [facility] agency  [facility]   Status of service:  Completed, signed off    Discharge Disposition:  HOME W HOME HEALTH SERVICES  If discussed at Long Length of Stay Meetings, dates discussed:   08/05/2011  08/12/2011    Comments:  08/20/11, Kathi Der RNC-MNN, BSN, [phone], CM received referral.  CM met with pt and offered choice for [facility] services.  Pt has chosen [facility]. Darl Pikes at [facility] contacted with order and confirmation of services received.  Pt states his mother and sister will be able to assist him at discharge. 08/13/11, Kathi Der RNC-MNN, BSN, Pt remains on TPN, advancing diet as tol. 08/05/11, Emeterio Balke RNC-MNN, BSN, Pt advanced to full liquid diet, continues on TPN, and has G tube, JP drain in place.  On IV Zosyn,IV Dilaudid, SSI. 07/30/11, Kiel Cockerell RNC-MNN, BSN, Pt NPO, has G tube in place, WBC declining-on Zosyn.   HOME HEALTH AGENCIES SERVING [place]   Agencies that are Medicare-Certified and are affiliated with [facility]  Telephone Number Address  [facility]   [facility] has ownership interest in this company; however, you are under no obligation to use this agency. [phone] or  [phone] [address] [place], [place] [address]   Agencies that are Medicare-Certified and are not affiliated with [facility] Agency Telephone Number Address  [facility] [phone] Fax [phone] [address] [place], [place]  [address]  [facility] [phone] or [phone] Fax [phone] [address] [place], [place] [address]  [facility] [phone] Fax [phone] [address] [place], [place] [address]  [facility] [phone] Fax [phone] [address] [place], [place]  [address]  [facility] [phone] Fax [phone] [address], Suite [place], [place] [address]  [facility] [phone] [address] [place], [place] [address]  [facility] [phone]  [address] [place], [place] [address]  [facility] [phone] or [phone] Fax [phone] [place], [place]  [address]  [facility] [phone] Fax [phone] [address] [place], [place]  [address]  [facility]  [phone] Fax [phone] [address]  [place], [place] [address]               Agencies that are not Medicare-Certified and are not affiliated with [facility] Agency Telephone Number Address  [facility] [place] [phone] or [phone] Fax [phone] [address] [place]  [address]  [facility] [phone] Fax [phone] [address] [place], [place]  [address]  [facility]  [phone] Fax [phone] [address] [place], [place] [address]  [facility] [phone] Fax [phone] [address] [place], [place] [address]  [facility] [phone] or [phone] Fax [phone] [address] [place],  [place]  [address]  [facility] [phone] or [phone] Fax [phone] [address] [place], [place]  [address]  [facility] [phone] Fax [phone] [address] [place], [place]  [address]  [facility] [phone] [address] [place], [place]  [address]  [facility] [phone] Fax [phone] [address] [place], [place]  [address]  [facility] [phone] Fax [phone] [address] [place], [place]  [address]  [facility] [phone] Fax [phone] [address] [place], [place] [address]  [facility] [phone] Fax [phone] [address] [place], [place]  [address]

## 2011-08-20 NOTE — Progress Notes (Signed)
23 Days Post-Op   Subjective: +BM last night. Denies N/V. Pain controlled with orals. Wants to go home.  Objective: Vital signs in last 24 hours: Temp:  [97.8 F (36.6 C)-98.3 F (36.8 C)] 97.8 F (36.6 C) (03/28 0600) Pulse Rate:  [62-103] 62  (03/28 0600) Resp:  [18] 18  (03/28 0600) BP: (98-148)/(66-90) 102/69 mmHg (03/28 0600) SpO2:  [99 %-100 %] 100 % (03/28 0600) Last BM Date: 08/20/11  Intake/Output from previous day: 03/27 0701 - 03/28 0700 In: 290 [P.O.:260] Out: 2075 [Urine:1875; Drains:200]   General appearance: alert and no distress Resp: clear to auscultation bilaterally Cardio: regular rate and rhythm GI: Soft, +BS. Incision nearly healed. G-tube in place.    Assessment/Plan: SBO s/p ex lap, SBR 07/18/11 Dr. Johna Sheriff POD 23  S/p repeat ex lap, open gastrostomy, drainage of abscess 07/28/11 Dr. 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 PUD/GI Bleed  Principal Problem:  *SBO (small bowel obstruction)  Active Problems:  Ileus following gastrointestinal surgery  Malnutrition  Postoperative intra-abdominal abscess  Dispo -- D/C home   LOS: 34 days    [name] J. 08/20/2011

## 2011-08-20 NOTE — Discharge Instructions (Signed)
High Protein, High Calorie Diet A high protein, high calorie diet increases the amount of protein and calories you eat. You may need more protein and calories in your diet because of illness, surgery, injury, weight loss, or having a poor appetite. Eating high protein and high calorie foods can help you gain weight, heal, and recover after illness.  SERVING SIZES Measuring foods and serving sizes helps to make sure you are getting the right amount of food. The list below tells how big or small some common serving sizes are.   1 oz.........4 stacked dice.   3 oz........Marland KitchenDeck of cards.   1 tsp.......Marland KitchenTip of little finger.   1 tbs......Marland KitchenMarland KitchenThumb.   2 tbs.......Marland KitchenGolf ball.    cup......Marland KitchenHalf of a fist.   1 cup.......Marland KitchenA fist.  HIGH PROTEIN FOODS Dairy  Whole milk.   Whole milk yogurt.   Powdered milk.   Cheese.   [employer].   Instant breakfast products.   Eggnog.  Tips for adding to your diet:  Use whole milk when making hot cereal, puddings, soups, and hot cocoa.   Add powdered milk to baked goods, smoothies, and milkshakes.   Make whole milk yogurt parfaits by adding granola, fruit, or nuts.   Add cheese to sandwiches, pastas, soups, and casseroles.   Add fruit to cottage cheese.  Meat   Beef, pork, and poultry.   Fish and seafood.   Peanut butter.   Dried beans.   Eggs.  Tips for adding to your diet:  Make meat and cheese omelets.   Add eggs to salads and baked goods.   Add fish and seafood to salads.   Add meat and poultry to casseroles, salads, and soups.   Use peanut butter as a topping for pretzels, celery, crackers, or add it to baked goods.   Use beans in casseroles, dips, and spreads.  GENERAL GUIDELINES TO INCREASE CALORIES  Replace calorie-free drinks with calorie-containing drinks, such as milk, fruit juices, regular soda, milkshakes, and hot chocolate.   Try to eat 6 small meals instead of 3 large meals each day.   Keep snacks  handy, such as nuts, trail mixes, dried fruit, and yogurt.   Choose foods with sauces and gravies.   Add dried fruits, honey, and half-and-half to hot or cold cereal.   Add extra fats when possible, such as butter, sour cream, cream cheese, and salad dressings.   Add cheese to foods often.   Consider adding a clear liquid nutritional supplement to your diet. Your caregiver can give you recommendations.  HIGH CALORIE FOODS Grain/Starch  Baked goods, such as muffins and quick breads.   Croissants.   Pancakes and waffles.  Vegetable   Sauted vegetables in oil.   Fried vegetables.   Salad greens with regular salad dressing or vinegar and oil.  Fruit  Dried fruit.   Canned fruit in syrup.   Fruit juice.  Fat  Avocado.   Butter or margarine.   Whipped cream.   Mayonnaise.   Salad dressing.   Peanuts and mixed nuts.   Cream cheese and sour cream.  Sweets and Dessert  Cake.   Cookies.   Pie.   Ice cream.   Doughnuts and pastries.   Protein and meal replacement bars.   Jam, preserves, and jelly.   Candy bars.   Chocolate.   Chocolate, caramel, or other flavored syrups.  Document Released: 05/11/2005 Document Revised: 04/30/2011 Document Reviewed: 02/11/2007 [facility] Patient Information 2012 [place], [place].    Wash wound daily in  shower with soap and water. Apply antibiotic ointment (e.g. Neosporin) twice daily and as needed to keep moist. Cover with dry dressing.

## 2011-08-20 NOTE — Progress Notes (Signed)
Patient d/c'd home. PICC line d/c'd by IV team. Patient and mother verbalized understanding of D/C instructions. CLum Keas

## 2011-08-20 NOTE — Progress Notes (Signed)
Agree with above. +BMs

## 2011-08-26 ENCOUNTER — Telehealth (INDEPENDENT_AMBULATORY_CARE_PROVIDER_SITE_OTHER): Payer: Self-pay | Admitting: General Surgery

## 2011-08-27 ENCOUNTER — Telehealth (INDEPENDENT_AMBULATORY_CARE_PROVIDER_SITE_OTHER): Payer: Self-pay | Admitting: General Surgery

## 2011-08-31 ENCOUNTER — Encounter: Payer: Self-pay | Admitting: Internal Medicine

## 2011-08-31 DIAGNOSIS — Z0289 Encounter for other administrative examinations: Secondary | ICD-10-CM

## 2011-09-01 ENCOUNTER — Telehealth (INDEPENDENT_AMBULATORY_CARE_PROVIDER_SITE_OTHER): Payer: Self-pay | Admitting: General Surgery

## 2011-09-01 ENCOUNTER — Telehealth (INDEPENDENT_AMBULATORY_CARE_PROVIDER_SITE_OTHER): Payer: Self-pay

## 2011-09-01 NOTE — Telephone Encounter (Signed)
Post op appointment made for 09/04/11 @ 10:00 w/Dr. Johna Sheriff

## 2011-09-01 NOTE — Telephone Encounter (Signed)
Left message to call our office, patient appointment time has been changed to 9:30 due to overbook schedule

## 2011-09-01 NOTE — Telephone Encounter (Signed)
Pt calling to ask about his postop appt.  Gave him the appt that had already been made for Friday, 09/04/11.  He understands we cannot accommodate his request for a Tues or Thurs and will come the this one.

## 2011-09-04 ENCOUNTER — Ambulatory Visit (INDEPENDENT_AMBULATORY_CARE_PROVIDER_SITE_OTHER): Payer: Self-pay | Admitting: General Surgery

## 2011-09-04 ENCOUNTER — Encounter (INDEPENDENT_AMBULATORY_CARE_PROVIDER_SITE_OTHER): Payer: Self-pay | Admitting: General Surgery

## 2011-09-04 VITALS — BP 96/62 | HR 68 | Temp 96.8°F | Resp 16 | Ht 66.0 in | Wt 115.4 lb

## 2011-09-04 DIAGNOSIS — Z09 Encounter for follow-up examination after completed treatment for conditions other than malignant neoplasm: Secondary | ICD-10-CM

## 2011-09-04 NOTE — Progress Notes (Signed)
History: The patient returns to the office having been discharged 2 weeks ago after a prolonged proximally 2 month hospitalization for small bowel obstruction. 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 He had a postoperative fluid collection that required percutaneous drainage and was on TNA but eventually improved and was discharged. He states he is now eating which for him is normal amounts without pain or vomiting. He has chronic constipation and his bowel movements are essentially normal for him. He has a gastrostomy tube in place and gives him some irritation but no other abdominal pain  Exam: BP 96/62  Pulse 68  Temp(Src) 96.8 F (36 C) (Temporal)  Resp 16  Ht 5\' 6"  (1.676 m)  Wt 115 lb 6 oz (52.334 kg)  BMI 18.62 kg/m2  Gen.: Very thin but otherwise appears well Abdomen: Flat soft and nontender. The PDS knots in the central excision were up through the skin and I trimmed these away. Wound is otherwise well healed.  Assessment and plan: Doing well at this point following extensive hospitalization for bowel obstruction. I told about the gastrostomy tube out in one month if he continued to E. well and was gaining weight. Will return in one month.

## 2011-10-15 ENCOUNTER — Encounter (INDEPENDENT_AMBULATORY_CARE_PROVIDER_SITE_OTHER): Payer: Self-pay | Admitting: General Surgery

## 2011-10-15 ENCOUNTER — Ambulatory Visit (INDEPENDENT_AMBULATORY_CARE_PROVIDER_SITE_OTHER): Payer: Medicaid Other | Admitting: General Surgery

## 2011-10-15 VITALS — BP 90/50 | HR 90 | Temp 98.2°F | Ht 67.0 in | Wt 117.6 lb

## 2011-10-15 DIAGNOSIS — Z09 Encounter for follow-up examination after completed treatment for conditions other than malignant neoplasm: Secondary | ICD-10-CM

## 2011-10-15 NOTE — Progress Notes (Signed)
History: Patient returns for long-term followup after her prolonged hospitalization for small bowel obstruction with lysis of adhesions. He now is doing much better. He is gaining weight. He denies any abdominal pain or vomiting.  Exam: Thin but appears stronger and healthier. Abdomen is soft nontender and well healed. I removed his gastrostomy tube.  Assessment plan: Doing well without complication identified. We discussed care of the gastrostomy site and he will call as needed if this does not heal promptly.

## 2011-10-15 NOTE — Patient Instructions (Signed)
Change gauze bandage over tube site daily and as needed. Call if there is persistent drainage after 2 weeks or any other problems or concerns

## 2011-12-25 ENCOUNTER — Other Ambulatory Visit: Payer: Self-pay | Admitting: *Deleted

## 2011-12-25 NOTE — Telephone Encounter (Signed)
Discuss with patient appt scheduled. 

## 2011-12-25 NOTE — Telephone Encounter (Signed)
Chart reviewed, I never prescribed an "antidiarrheal medicine". Unable to refill medicine. Office visit if needed

## 2011-12-28 ENCOUNTER — Ambulatory Visit: Payer: Medicaid Other | Admitting: Internal Medicine

## 2011-12-29 ENCOUNTER — Ambulatory Visit (INDEPENDENT_AMBULATORY_CARE_PROVIDER_SITE_OTHER): Payer: Medicaid Other | Admitting: Internal Medicine

## 2011-12-29 ENCOUNTER — Encounter: Payer: Self-pay | Admitting: Internal Medicine

## 2011-12-29 VITALS — BP 94/68 | HR 79 | Temp 97.4°F | Wt 112.0 lb

## 2011-12-29 DIAGNOSIS — R197 Diarrhea, unspecified: Secondary | ICD-10-CM

## 2011-12-29 NOTE — Progress Notes (Signed)
  Subjective:    Patient ID: [name], male    DOB: [birth year], 56 y.o.   MRN: [id number]  HPI Acute visit "I need a prescription strength antidiarrhea medicine". Patient couldn't tell me the name of the medication, states that Dr. Westley Gambles   prescribed that to him and that [facility]  Past Medical History  Diagnosis Date  . H/O: GI bleed   . Chronic neck pain   . Idiopathic scoliosis   . Colonic inertia     with chronic lifelong costipation  . Blood transfusion   . Constipation   . Arthritis    Past Surgical History  Procedure Date  . Left knee surgery 1994, 1992  . Hernia repair 1980    left inguinal hernia  . Multiple cervical fusions   . Surgery for imperforate anus 1958  . Upper gastrointestinal endoscopy 05/15/03  . Colonoscopy 11/08/2006  . Laparotomy 07/18/2011    Procedure: EXPLORATORY LAPAROTOMY;  Surgeon: Mariella Saa, MD;  Location: [facility]S;  Service: General;  Laterality: N/A;  lysis of adhesions entero enterostomy  . Laparotomy 07/28/2011    Procedure: EXPLORATORY LAPAROTOMY;  Surgeon: Mariella Saa, MD;  Location: [facility];  Service: General;  Laterality: N/A;      Social History: Single, no children tobacco-- never Illicit Drug Use - no Alcohol use--sometimes  occupation-- [place]   Review of Systems     Objective:   Physical Exam  Alert oriented x3, no apparent distress.      Assessment & Plan:   Diarrhea? Patient request a prescription for diarrhea, he could not tell me the name of the medication, he said that he has been getting this prescription from [facility], "Dr Westley Gambles prescribed it" , last visit with her in 2008. we called the pharmacy and the only thing he has been getting there is Ultram. He has a quite complicated GI history, at this point his only interest is to "get the prescription", did not request to be evaluated , thus I am not able to help  him today.

## 2013-02-03 IMAGING — CR DG ABDOMEN ACUTE W/ 1V CHEST
3 series · 3 of 3 positions shown · non-contrast
Comparison: 07/18/2011

CLINICAL DATA: Small bowel obstruction.

ACUTE ABDOMEN SERIES (ABDOMEN 2 VIEW & CHEST 1 VIEW)

[w chest pa]
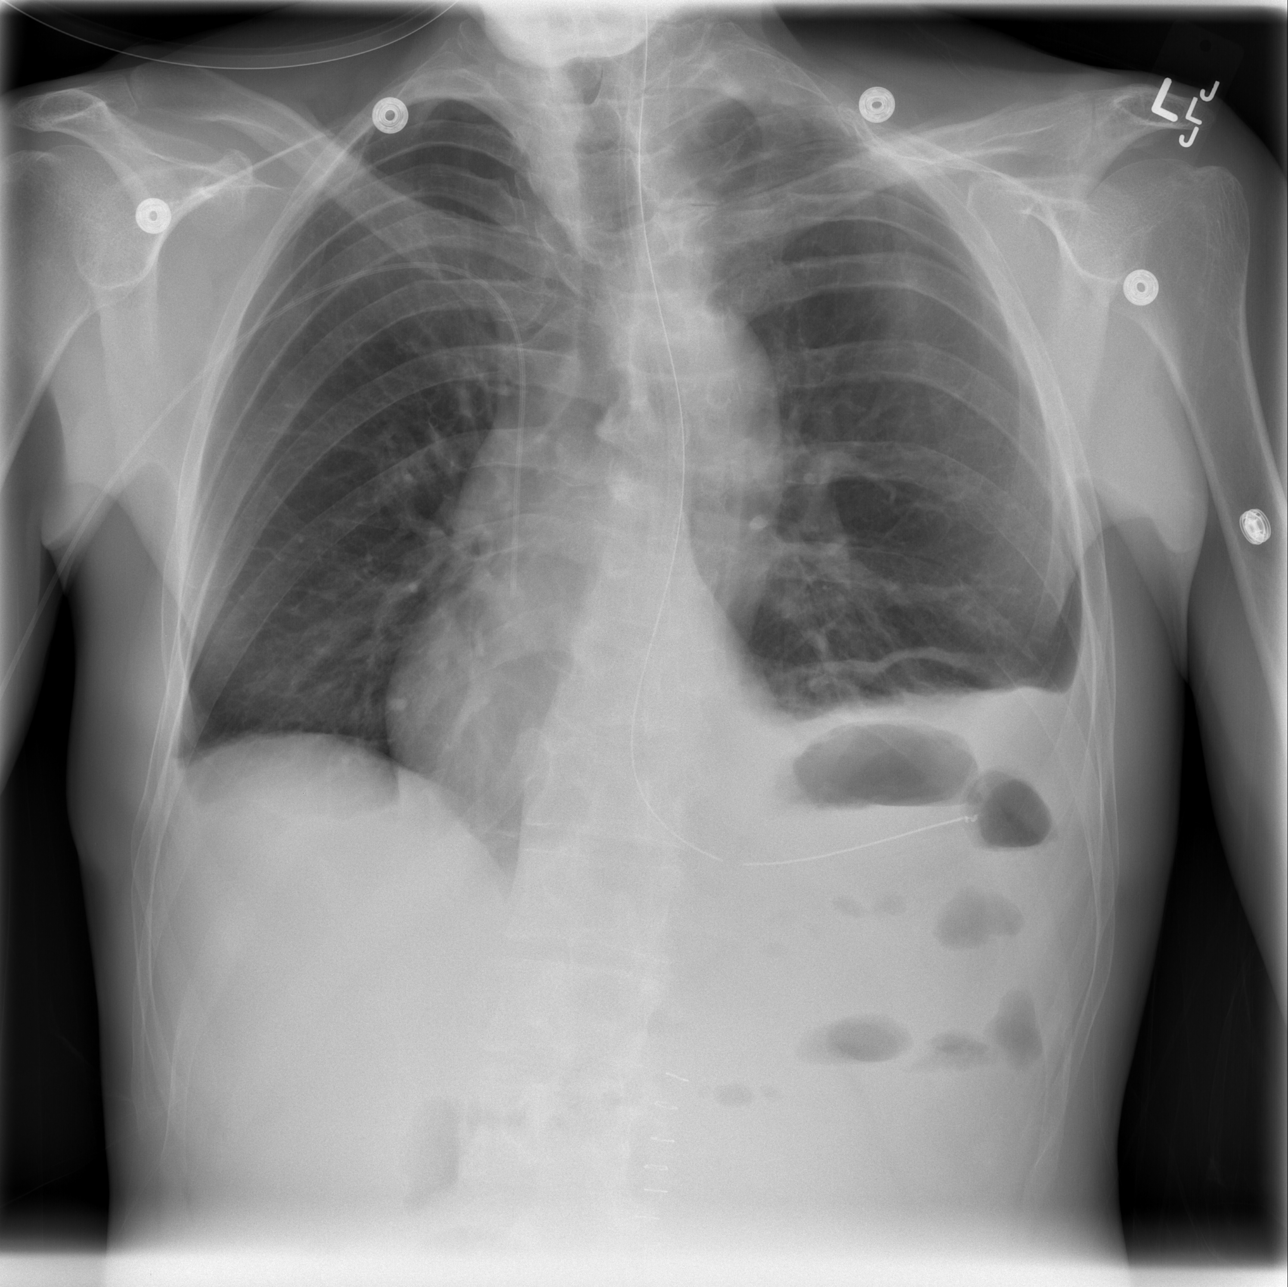

[w abdomen upright *]
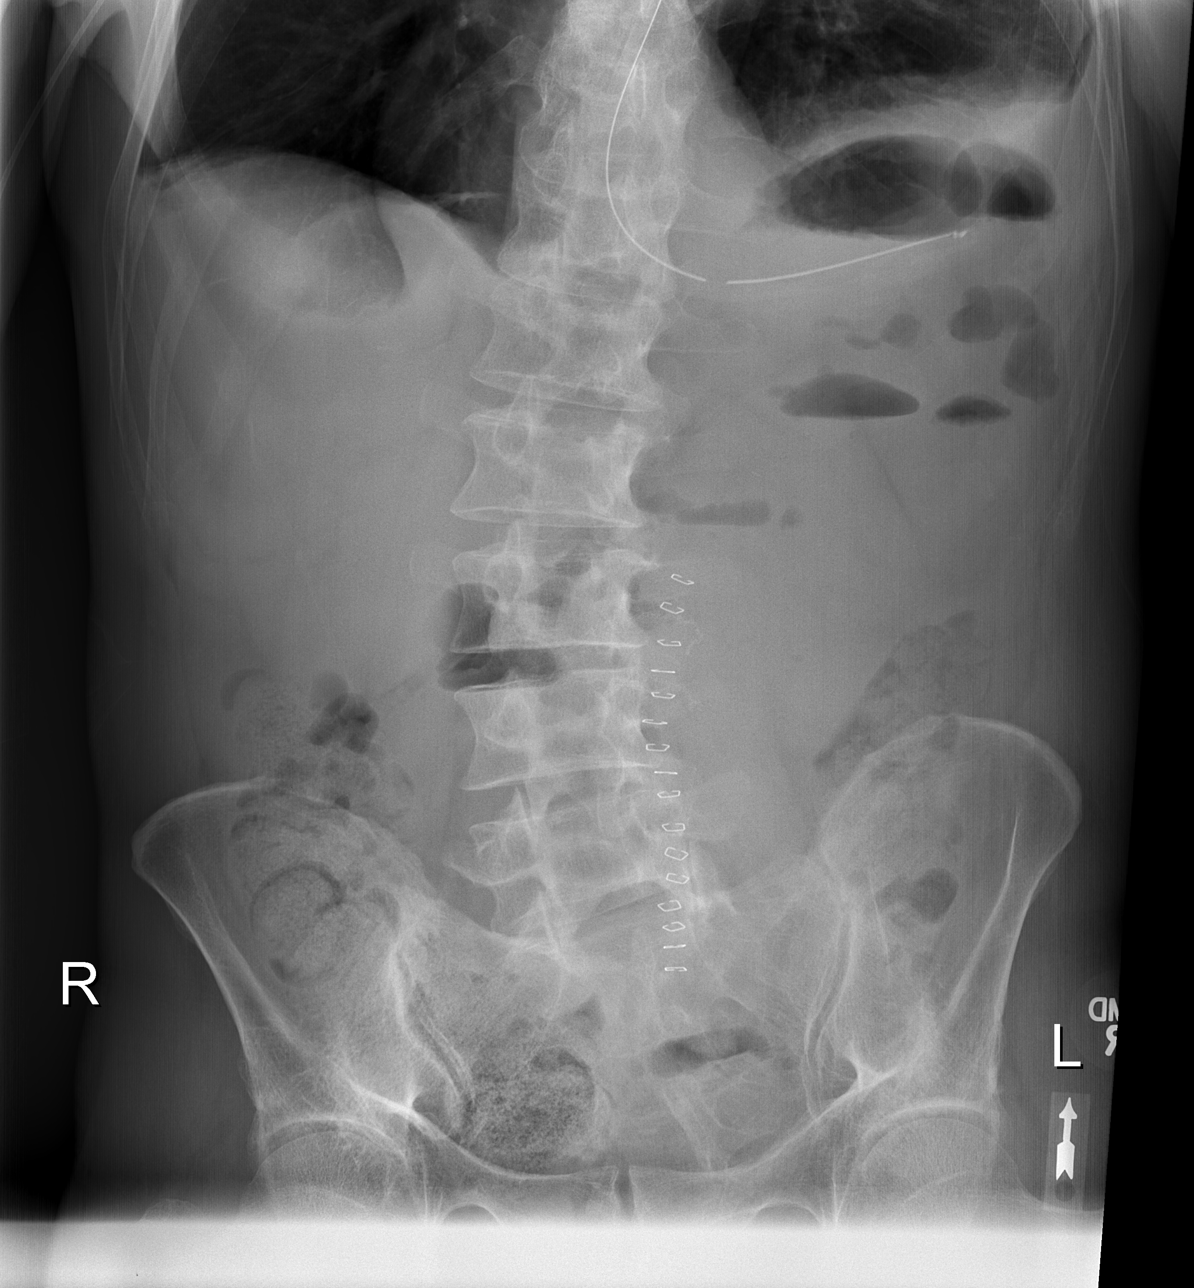

[t abdomen supine]
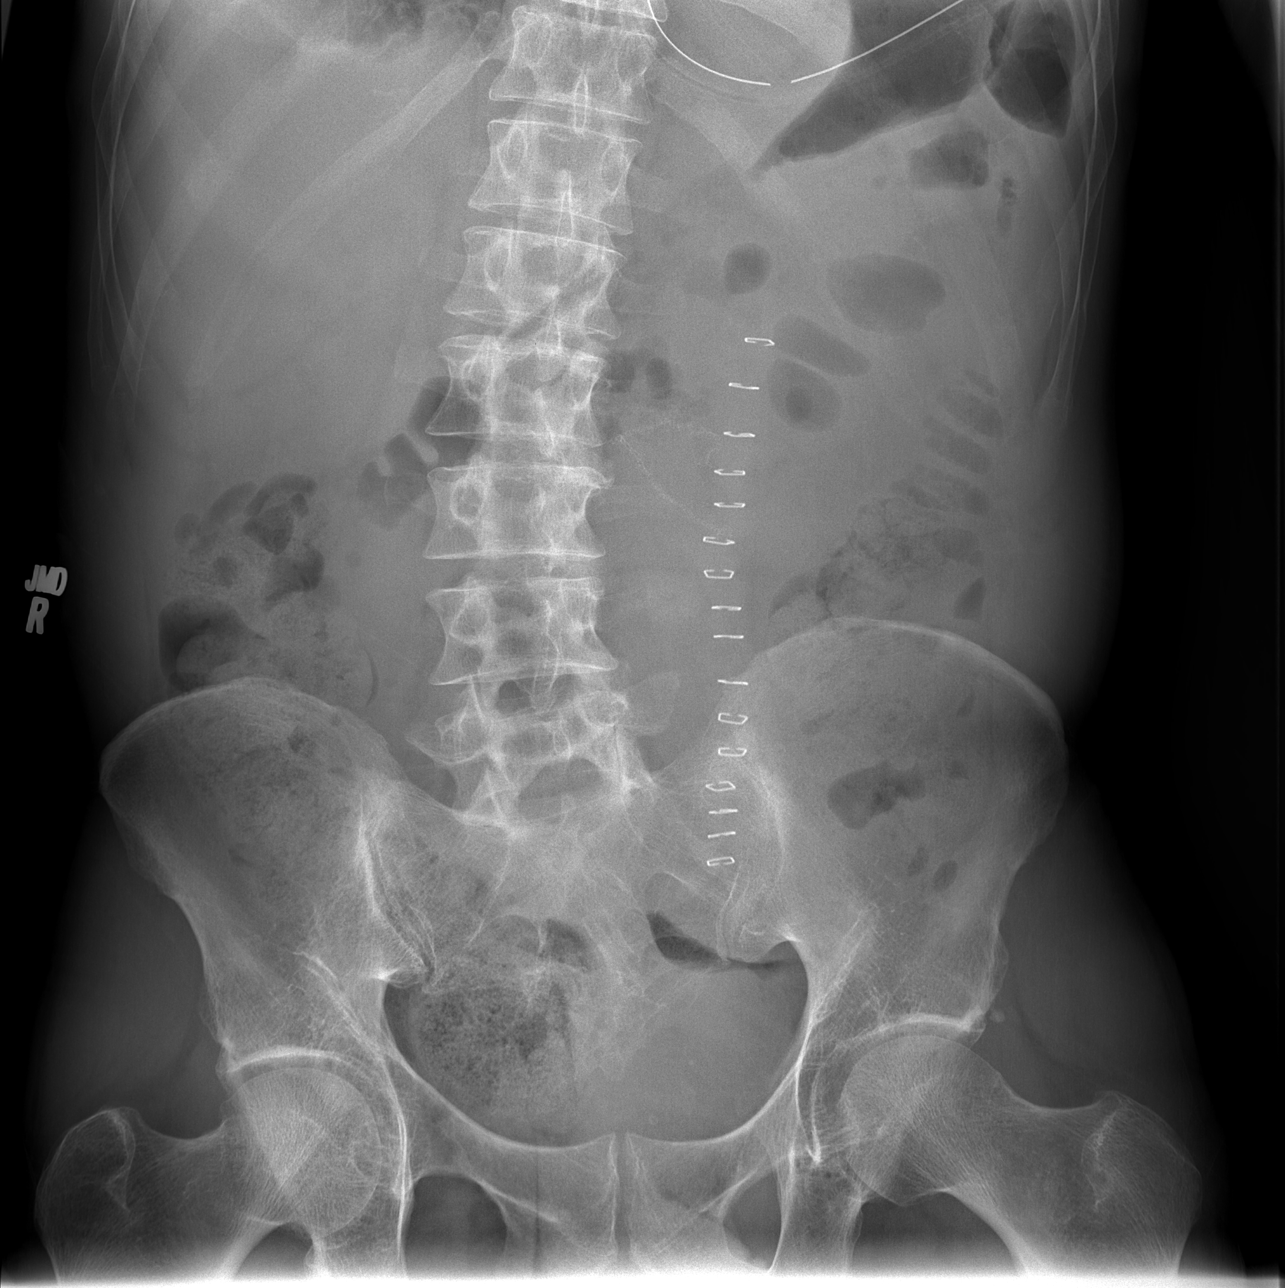

[3 of 3 positions shown; findings below may reference images not displayed]

FINDINGS: NG tube is in the stomach.  Right PICC line is in place
with the tip at the cavoatrial junction.  Bibasilar atelectasis
with small bilateral effusions.

Skin staples are noted in the midline of the lower abdomen and
upper pelvis.  There is a nonspecific bowel gas pattern.  Air-fluid
levels are seen within scattered left abdominal small bowel loops.
Gas and stool noted within the colon.  No free air.  No
organomegaly.
IMPRESSION: Nonspecific bowel gas pattern with scattered air-fluid levels and
left abdominal small bowel loops.  The previously seen small bowel
dilatation not appreciated on today's study.  Gas and stool within
colon.

NG tube in the stomach.

Bibasilar atelectasis, small bilateral effusions.

## 2013-02-09 ENCOUNTER — Ambulatory Visit: Payer: Self-pay | Admitting: Family Medicine

## 2013-02-09 VITALS — BP 100/72 | HR 108 | Temp 97.7°F | Resp 18 | Ht 66.0 in | Wt 127.0 lb

## 2013-02-09 DIAGNOSIS — R21 Rash and other nonspecific skin eruption: Secondary | ICD-10-CM

## 2013-02-09 MED ORDER — TRIAMCINOLONE ACETONIDE 0.1 % EX CREA: TOPICAL_CREAM | Freq: Three times a day (TID) | CUTANEOUS | Status: DC

## 2013-02-09 NOTE — Progress Notes (Signed)
[facility]:  Office Visit  Chief Complaint:  Chief Complaint  Patient presents with  . Rash    right leg- itchy- happpened 2 days ago    HPI: [name] is a 57 y.o. male who complains of  2 day history of rash after spilling all purpose cleaner on himself. He deneis having any other sxs.+  Itchy, same size macuolpapular lesions, itchy and he has scratched the lesions. He denies any recent new meds, foods, travels, tick/bug bites. He does have dogs but has not checked his dogs. Denies having in urinary sxs or changes in urine color, or skin color. He urinates normally. He has not gone outside.     Past Medical History  Diagnosis Date  . H/O: GI bleed   . Chronic neck pain   . Idiopathic scoliosis   . Colonic inertia     with chronic lifelong costipation  . Blood transfusion   . Constipation   . Arthritis    Past Surgical History  Procedure Laterality Date  . Left knee surgery  1994, 1992  . Hernia repair  1980    left inguinal hernia  . Multiple cervical fusions    . Surgery for imperforate anus  1958  . Upper gastrointestinal endoscopy  05/15/03  . Colonoscopy  11/08/2006  . Laparotomy  07/18/2011    Procedure: EXPLORATORY LAPAROTOMY;  Surgeon: Mariella Saa, MD;  Location: [facility];  Service: General;  Laterality: N/A;  lysis of adhesions entero enterostomy  . Laparotomy  07/28/2011    Procedure: EXPLORATORY LAPAROTOMY;  Surgeon: Mariella Saa, MD;  Location: [facility];  Service: General;  Laterality: N/A;   History   Social History  . Marital Status: Single    Spouse Name: N/A    Number of Children: N/A  . Years of Education: N/A   Social History Main Topics  . Smoking status: Never Smoker   . Smokeless tobacco: Never Used  . Alcohol Use: No  . Drug Use: No  . Sexual Activity: No   Other Topics Concern  . None   Social History Narrative  . None   Family History  Problem Relation Age of Onset  . Alzheimer's disease Father    . Diabetes Mother    No Known Allergies Prior to Admission medications   Not on File     ROS: The patient denies fevers, chills, night sweats, unintentional weight loss, chest pain, palpitations, wheezing, dyspnea on exertion, nausea, vomiting, abdominal pain, dysuria, hematuria, melena, numbness, weakness, or tingling.   All other systems have been reviewed and were otherwise negative with the exception of those mentioned in the HPI and as above.    PHYSICAL EXAM: Filed Vitals:   02/09/13 1733  BP: 100/72  Pulse: 108  Temp: 97.7 F (36.5 C)  Resp: 18   Filed Vitals:   02/09/13 1733  Height: 5\' 6"  (1.676 m)  Weight: 127 lb (57.607 kg)   Body mass index is 20.51 kg/(m^2).  General: Alert, no acute distress HEENT:  Normocephalic, atraumatic, oropharynx patent. EOMI, PERRLA Cardiovascular:  Regular rate and rhythm, no rubs murmurs or gallops.  No Carotid bruits, radial pulse intact. No pedal edema.  Respiratory: Clear to auscultation bilaterally.  No wheezes, rales, or rhonchi.  No cyanosis, no use of accessory musculature GI: No organomegaly, abdomen is soft and non-tender, positive bowel sounds.  No masses. Skin: + excoriated rash on bilateral anterior legs, no where else, not on chest arms, soles  of feet or palms Neurologic: Facial musculature symmetric. Psychiatric: Patient is appropriate throughout our interaction. Lymphatic: No cervical lymphadenopathy Musculoskeletal: Gait intact.   LABS:    EKG/XRAY:   Primary read interpreted by Dr. Conley Rolls at [facility].   ASSESSMENT/PLAN: Encounter Diagnosis  Name Primary?  . Rash and nonspecific skin eruption Yes   He will check if his dog has fleas He lives alone, he denies recent travels or stays outside of home. No current e.o scabies or bedbugs, maybe fleas/chiggers unless spreads Rx triamcinolone cream OTC benadryl Gross sideeffects, risk and benefits, and alternatives of medications d/w patient. Patient is aware that all  medications have potential sideeffects and we are unable to predict every sideeffect or drug-drug interaction that may occur.  Hamilton Capri PHUONG, DO 02/09/2013 6:32 PM

## 2013-08-16 ENCOUNTER — Ambulatory Visit (INDEPENDENT_AMBULATORY_CARE_PROVIDER_SITE_OTHER): Payer: Self-pay | Admitting: Nurse Practitioner

## 2013-08-16 ENCOUNTER — Encounter: Payer: Self-pay | Admitting: Nurse Practitioner

## 2013-08-16 VITALS — BP 105/68 | HR 92 | Temp 98.1°F | Ht 66.0 in | Wt 136.0 lb

## 2013-08-16 DIAGNOSIS — H6090 Unspecified otitis externa, unspecified ear: Secondary | ICD-10-CM

## 2013-08-16 DIAGNOSIS — H6120 Impacted cerumen, unspecified ear: Secondary | ICD-10-CM

## 2013-08-16 DIAGNOSIS — H60399 Other infective otitis externa, unspecified ear: Secondary | ICD-10-CM

## 2013-08-16 MED ORDER — NEOMYCIN-POLYMYXIN-HC 1 % OT SOLN: 3.0000 [drp] | Freq: Four times a day (QID) | OTIC | Status: DC

## 2013-08-16 NOTE — Progress Notes (Signed)
   Subjective:    Patient ID: Fabio Beringandall N Eager, male    DOB: 01/01/1956, 58 y.o.   MRN: [id number]  Otalgia  There is pain in the right (pt c/o ear wax build up, decreased hearing) ear. This is a recurrent problem. The current episode started 1 to 4 weeks ago (1 wk). The problem occurs constantly. The problem has been unchanged. There has been no fever. The patient is experiencing no pain. Associated symptoms include hearing loss. Pertinent negatives include no abdominal pain, coughing, diarrhea, ear discharge, headaches, rash, sore throat or vomiting. He has tried nothing for the symptoms.      Review of Systems  Constitutional: Negative for fever, activity change, appetite change and fatigue.  HENT: Positive for hearing loss. Negative for congestion, ear discharge, ear pain and sore throat.   Respiratory: Negative for cough.   Gastrointestinal: Negative for vomiting, abdominal pain and diarrhea.  Skin: Negative for rash.  Neurological: Negative for dizziness, light-headedness and headaches.       Objective:   Physical Exam  Vitals reviewed. Constitutional: He is oriented to person, place, and time. He appears well-developed and well-nourished. No distress.  HENT:  Head: Normocephalic and atraumatic.  Right Ear: External ear normal.  Left Ear: External ear normal.  r TM obscured by cerumen. LTM nml, bones visible, small excoriated lesion canal.  Eyes: Conjunctivae are normal. Right eye exhibits no discharge. Left eye exhibits no discharge.  Cardiovascular: Normal rate.   Pulmonary/Chest: Effort normal. No respiratory distress.  Neurological: He is alert and oriented to person, place, and time.  Skin: Skin is warm and dry.  Psychiatric: He has a normal mood and affect. His behavior is normal. Thought content normal.          Assessment & Plan:  1. Ceruminosis R ear. Warm water flush in ofc. Curette used to remove plug. RTM nml, bones visible. Canal non-traumatic. Cortisporin  for L canal-has abrasion, otitis externa prevention.

## 2013-08-16 NOTE — Progress Notes (Signed)
Pre visit review using our clinic review tool, if applicable. No additional management support is needed unless otherwise documented below in the visit note. 

## 2013-08-16 NOTE — Patient Instructions (Addendum)
To keep ears clear, use syringe to flush ears with 1:1 solution of warm water & hydrogen peroxide 2-3 times weekly when getting into shower. Use antibiotic ear drop in left ear for 4-5 days to prevent infection. Don't flush left ear until stop antibiotic drops.    Cerumen Impaction A cerumen impaction is when the wax in your ear forms a plug. This plug usually causes reduced hearing. Sometimes it also causes an earache or dizziness. Removing a cerumen impaction can be difficult and painful. The wax sticks to the ear canal. The canal is sensitive and bleeds easily. If you try to remove a heavy wax buildup with a cotton tipped swab, you may push it in further. Irrigation with water, suction, and small ear curettes may be used to clear out the wax. If the impaction is fixed to the skin in the ear canal, ear drops may be needed for a few days to loosen the wax. People who build up a lot of wax frequently can use ear wax removal products available in your local drugstore. SEEK MEDICAL CARE IF:  You develop an earache, increased hearing loss, or marked dizziness. Document Released: 06/18/2004 Document Revised: 08/03/2011 Document Reviewed: 08/08/2009 [facility] Patient Information 2014 [employer] [place].

## 2013-08-16 NOTE — Addendum Note (Signed)
Addended by: Marlene LardMILLER, Lenyx Boody M on: 08/16/2013 12:01 PM   Modules accepted: Orders

## 2013-11-06 ENCOUNTER — Ambulatory Visit: Payer: Self-pay | Admitting: Internal Medicine

## 2014-03-27 ENCOUNTER — Ambulatory Visit: Payer: Self-pay | Admitting: Internal Medicine

## 2014-03-28 ENCOUNTER — Ambulatory Visit: Payer: Self-pay | Admitting: Internal Medicine

## 2014-03-28 ENCOUNTER — Ambulatory Visit (INDEPENDENT_AMBULATORY_CARE_PROVIDER_SITE_OTHER): Payer: Self-pay | Admitting: Internal Medicine

## 2014-03-28 ENCOUNTER — Encounter: Payer: Self-pay | Admitting: Internal Medicine

## 2014-03-28 DIAGNOSIS — F05 Delirium due to known physiological condition: Secondary | ICD-10-CM

## 2014-03-28 NOTE — Progress Notes (Deleted)
Pre visit review using our clinic review tool, if applicable. No additional management support is needed unless otherwise documented below in the visit note. 

## 2014-03-29 ENCOUNTER — Encounter: Payer: Self-pay | Admitting: Internal Medicine

## 2014-03-29 ENCOUNTER — Ambulatory Visit (INDEPENDENT_AMBULATORY_CARE_PROVIDER_SITE_OTHER): Payer: Self-pay | Admitting: Internal Medicine

## 2014-03-29 VITALS — BP 107/74 | HR 76 | Temp 97.8°F | Wt 120.1 lb

## 2014-03-29 DIAGNOSIS — L609 Nail disorder, unspecified: Secondary | ICD-10-CM

## 2014-03-29 NOTE — Progress Notes (Signed)
Pre visit review using our clinic review tool, if applicable. No additional management support is needed unless otherwise documented below in the visit note. 

## 2014-03-29 NOTE — Progress Notes (Signed)
   Subjective:    Patient ID: [name], male    DOB: 07/25/1955, 58 y.o.   MRN: [id number]  DOS:  03/29/2014 Type of visit - description : acute Interval history: Developed right great toe pain 2 weeks ago, thinks is ingrown toenail. Denies redness, swelling or discharge   Past Medical History  Diagnosis Date  . H/O: GI bleed   . Chronic neck pain   . Idiopathic scoliosis   . Colonic inertia     with chronic lifelong costipation  . Blood transfusion   . Constipation   . Arthritis     Past Surgical History  Procedure Laterality Date  . Left knee surgery  1994, 1992  . Hernia repair  1980    left inguinal hernia  . Multiple cervical fusions    . Surgery for imperforate anus  1958  . Upper gastrointestinal endoscopy  05/15/03  . Colonoscopy  11/08/2006  . Laparotomy  07/18/2011    Procedure: EXPLORATORY LAPAROTOMY;  Surgeon: Mariella SaaBenjamin T Hoxworth, MD;  Location: [facility];  Service: General;  Laterality: N/A;  lysis of adhesions entero enterostomy  . Laparotomy  07/28/2011    Procedure: EXPLORATORY LAPAROTOMY;  Surgeon: Mariella SaaBenjamin T Hoxworth, MD;  Location: [facility];  Service: General;  Laterality: N/A;    History   Social History  . Marital Status: Single    Spouse Name: N/A    Number of Children: N/A  . Years of Education: N/A   Occupational History  . Not on file.   Social History Main Topics  . Smoking status: Never Smoker   . Smokeless tobacco: Never Used  . Alcohol Use: No  . Drug Use: No  . Sexual Activity: No   Other Topics Concern  . Not on file   Social History Narrative        Medication List    Notice  As of 03/29/2014 11:59 PM   You have not been prescribed any medications.         Objective:   Physical Exam BP 107/74 mmHg  Pulse 76  Temp(Src) 97.8 F (36.6 C) (Oral)  Wt 120 lb 2 oz (54.488 kg)  SpO2 96%  General -- alert, well-developed, NAD.   Extremities--   right foot:: These are dystrophic and extremity large and thick. The right  great toe with no redness, swelling or discharge. The proximal part of the nail seems embedded on the skin. Psych-- Cognition and judgment appear intact. Cooperative with normal attention span and concentration. No anxious or depressed appearing.     Assessment & Plan:   Dystrophic nails No evidence of infection, he needs his nails trimmed and taking care of. Refer to podiatry  Also, I am listed as his PCP, recommend to come back for a physical at his earliest convenience

## 2014-03-29 NOTE — Patient Instructions (Signed)
Please schedule a physical exam, fasting at your convenience

## 2014-03-30 ENCOUNTER — Ambulatory Visit: Payer: Self-pay | Admitting: Internal Medicine

## 2014-05-09 ENCOUNTER — Emergency Department (HOSPITAL_COMMUNITY)
Admission: EM | Admit: 2014-05-09 | Discharge: 2014-05-09 | Disposition: A | Discharge status: Home / Self Care | Payer: Self-pay | Attending: Emergency Medicine | Admitting: Emergency Medicine

## 2014-05-09 ENCOUNTER — Other Ambulatory Visit: Payer: Self-pay

## 2014-05-09 ENCOUNTER — Encounter: Payer: Self-pay | Admitting: Medical

## 2014-05-09 ENCOUNTER — Telehealth: Payer: Self-pay | Admitting: Medical

## 2014-05-09 ENCOUNTER — Ambulatory Visit (INDEPENDENT_AMBULATORY_CARE_PROVIDER_SITE_OTHER): Payer: Self-pay | Admitting: Medical

## 2014-05-09 ENCOUNTER — Encounter (HOSPITAL_COMMUNITY): Payer: Self-pay | Admitting: Family Medicine

## 2014-05-09 ENCOUNTER — Emergency Department (HOSPITAL_COMMUNITY): Payer: Medicaid Other

## 2014-05-09 ENCOUNTER — Ambulatory Visit: Payer: Self-pay | Admitting: Medical

## 2014-05-09 ENCOUNTER — Ambulatory Visit (HOSPITAL_BASED_OUTPATIENT_CLINIC_OR_DEPARTMENT_OTHER)
Admission: RE | Admit: 2014-05-09 | Discharge: 2014-05-09 | Disposition: A | Discharge status: Home / Self Care | Payer: Self-pay | Source: Ambulatory Visit | Attending: Medical | Admitting: Medical

## 2014-05-09 VITALS — BP 113/73 | HR 77 | Temp 97.6°F | Ht 66.0 in | Wt 120.0 lb

## 2014-05-09 DIAGNOSIS — R42 Dizziness and giddiness: Secondary | ICD-10-CM | POA: Insufficient documentation

## 2014-05-09 DIAGNOSIS — G8929 Other chronic pain: Secondary | ICD-10-CM | POA: Insufficient documentation

## 2014-05-09 DIAGNOSIS — R5383 Other fatigue: Secondary | ICD-10-CM | POA: Insufficient documentation

## 2014-05-09 DIAGNOSIS — Z8719 Personal history of other diseases of the digestive system: Secondary | ICD-10-CM | POA: Insufficient documentation

## 2014-05-09 DIAGNOSIS — F05 Delirium due to known physiological condition: Secondary | ICD-10-CM

## 2014-05-09 DIAGNOSIS — M4802 Spinal stenosis, cervical region: Secondary | ICD-10-CM | POA: Insufficient documentation

## 2014-05-09 DIAGNOSIS — Z8739 Personal history of other diseases of the musculoskeletal system and connective tissue: Secondary | ICD-10-CM | POA: Insufficient documentation

## 2014-05-09 DIAGNOSIS — Q758 Other specified congenital malformations of skull and face bones: Secondary | ICD-10-CM | POA: Insufficient documentation

## 2014-05-09 DIAGNOSIS — R410 Disorientation, unspecified: Secondary | ICD-10-CM | POA: Insufficient documentation

## 2014-05-09 DIAGNOSIS — G919 Hydrocephalus, unspecified: Secondary | ICD-10-CM | POA: Insufficient documentation

## 2014-05-09 LAB — CBC WITH DIFFERENTIAL/PLATELET
Basophils Absolute: 0 10*3/uL (ref 0.0–0.1)
Basophils Relative: 0.5 % (ref 0.0–3.0)
EOS PCT: 5.5 % — AB (ref 0.0–5.0)
Eosinophils Absolute: 0.3 10*3/uL (ref 0.0–0.7)
HEMATOCRIT: 44.8 % (ref 39.0–52.0)
Hemoglobin: 14.8 g/dL (ref 13.0–17.0)
LYMPHS ABS: 1.4 10*3/uL (ref 0.7–4.0)
Lymphocytes Relative: 22.5 % (ref 12.0–46.0)
MCHC: 33 g/dL (ref 30.0–36.0)
MCV: 86.2 fl (ref 78.0–100.0)
MONO ABS: 0.3 10*3/uL (ref 0.1–1.0)
Monocytes Relative: 5.3 % (ref 3.0–12.0)
Neutro Abs: 4 10*3/uL (ref 1.4–7.7)
Neutrophils Relative %: 66.2 % (ref 43.0–77.0)
PLATELETS: 272 10*3/uL (ref 150.0–400.0)
RBC: 5.21 Mil/uL (ref 4.22–5.81)
RDW: 14 % (ref 11.5–15.5)
WBC: 6 10*3/uL (ref 4.0–10.5)

## 2014-05-09 LAB — POCT URINALYSIS DIPSTICK
BILIRUBIN UA: NEGATIVE
Blood, UA: NEGATIVE
GLUCOSE UA: NEGATIVE
KETONES UA: NEGATIVE
LEUKOCYTES UA: NEGATIVE
Nitrite, UA: NEGATIVE
PH UA: 7.5
Spec Grav, UA: <=1.005
Urobilinogen, UA: 0.2

## 2014-05-09 LAB — I-STAT TROPONIN, ED: Troponin i, poc: 0.01 ng/mL (ref 0.00–0.08)

## 2014-05-09 LAB — COMPREHENSIVE METABOLIC PANEL
ALK PHOS: 50 U/L (ref 39–117)
ALT: 16 U/L (ref 0–53)
AST: 19 U/L (ref 0–37)
Albumin: 4.6 g/dL (ref 3.5–5.2)
BILIRUBIN TOTAL: 0.6 mg/dL (ref 0.2–1.2)
BUN: 12 mg/dL (ref 6–23)
CO2: 31 mEq/L (ref 19–32)
Calcium: 9.4 mg/dL (ref 8.4–10.5)
Chloride: 105 mEq/L (ref 96–112)
Creatinine, Ser: 0.9 mg/dL (ref 0.4–1.5)
GFR: 93.52 mL/min (ref >60.00)
GLUCOSE: 88 mg/dL (ref 70–99)
Potassium: 3.6 mEq/L (ref 3.5–5.1)
SODIUM: 138 meq/L (ref 135–145)
TOTAL PROTEIN: 6.9 g/dL (ref 6.0–8.3)

## 2014-05-09 MED ORDER — ALPRAZOLAM 1 MG PO TABS: 1.0000 mg | ORAL_TABLET | Freq: Every morning | ORAL | Status: DC

## 2014-05-09 MED ORDER — IOHEXOL 350 MG/ML SOLN
50.0000 mL | Freq: Once | INTRAVENOUS | Status: AC | PRN
  Administered 2014-05-09: 50 mL via INTRAVENOUS

## 2014-05-09 NOTE — Progress Notes (Signed)
Subjective:    Patient ID: [name], male    DOB: 11/18/1955, 58 y.o.   MRN: [id number]  HPI   Pt in stating 3 days feeling very tired, and he feels little confused. No black or bloody stools. No abdominal pain. Pt has history of malnutrition. Pt weight was 115 lb in past 2 years ago. Now weight is higher.  No vomiting, no nausea. Denies any urine symptoms. No abdominal pain.  Pt has history of small bowel obstruction. Last bowel movement last night.  Pt stats most he ever weighed was 138 pounds.  No HA. No vision changes. No hands or feet numbness. Equal extremity weakness. Pt describes on and off dizziness. Last for 10 seconds then subsides.  Pt only on natural mood relaxer. Pt has been on that off and on for couple of years.   No alcohol use.  Past Medical History  Diagnosis Date  . H/O: GI bleed   . Chronic neck pain   . Idiopathic scoliosis   . Colonic inertia     with chronic lifelong costipation  . Blood transfusion   . Constipation   . Arthritis     History   Social History  . Marital Status: Single    Spouse Name: N/A    Number of Children: N/A  . Years of Education: N/A   Occupational History  . Not on file.   Social History Main Topics  . Smoking status: Never Smoker   . Smokeless tobacco: Never Used  . Alcohol Use: No  . Drug Use: No  . Sexual Activity: No   Other Topics Concern  . Not on file   Social History Narrative    Past Surgical History  Procedure Laterality Date  . Left knee surgery  1994, 1992  . Hernia repair  1980    left inguinal hernia  . Multiple cervical fusions    . Surgery for imperforate anus  1958  . Upper gastrointestinal endoscopy  05/15/03  . Colonoscopy  11/08/2006  . Laparotomy  07/18/2011    Procedure: EXPLORATORY LAPAROTOMY;  Surgeon: Mariella SaaBenjamin T Hoxworth, MD;  Location: [facility];  Service: General;  Laterality: N/A;  lysis of adhesions entero enterostomy  . Laparotomy  07/28/2011    Procedure: EXPLORATORY  LAPAROTOMY;  Surgeon: Mariella SaaBenjamin T Hoxworth, MD;  Location: [facility];  Service: General;  Laterality: N/A;    Family History  Problem Relation Age of Onset  . Alzheimer's disease Father   . Diabetes Mother     No Known Allergies  No current outpatient prescriptions on file prior to visit.   No current facility-administered medications on file prior to visit.    BP 113/73 mmHg  Pulse 77  Temp(Src) 97.6 F (36.4 C) (Oral)  Ht 5\' 6"  (1.676 m)  Wt 120 lb (54.432 kg)  BMI 19.38 kg/m2  SpO2 97%          Review of Systems  Constitutional: Positive for fatigue. Negative for fever and diaphoresis.  HENT: Negative.   Respiratory: Negative for cough, choking, shortness of breath and wheezing.        No sob reported to me at all.   Cardiovascular: Negative for chest pain and palpitations.  Gastrointestinal: Negative for nausea, vomiting, abdominal pain, diarrhea, constipation, blood in stool, abdominal distention, anal bleeding and rectal pain.  Genitourinary: Negative.   Musculoskeletal: Negative for back pain.  Psychiatric/Behavioral: Negative for suicidal ideas, hallucinations, behavioral problems, sleep disturbance, self-injury, dysphoric mood, decreased concentration and agitation.  The patient is not nervous/anxious and is not hyperactive.        Some mild foggy/clouded thinking but not specific in his description.        Objective:   Physical Exam   General Mental Status- Alert. General Appearance- Not in acute distress. Very thin pt. His overall demeanor presently not confused A an O x 3.  Skin General: Color- Normal Color. Moisture- Normal Moisture.  Neck Carotid Arteries- Normal color. Moisture- Normal Moisture. No carotid bruits. No JVD.  Chest and Lung Exam Auscultation: Breath Sounds:-Normal, even and unlabored.  Cardiovascular Auscultation:Rythm- Regular, rate and rhythm. Murmurs & Other Heart Sounds:Auscultation of the heart reveals- No  Murmurs.  Abdomen Inspection:-Inspeection Normal. Palpation/Percussion:Note:No mass. Palpation and Percussion of the abdomen reveal- Non Tender, Non Distended + BS, no rebound or guarding.    Neurologic Cranial Nerve exam:- CN III-XII intact(No nystagmus), symmetric smile. Drift Test:- No drift. Romberg Exam:- Negative.  Heal to Toe Gait exam:-Poor heal to toe. Finger to Nose:- Normal/Intact Strength:- 5/5 equal and symmetric strength both upper and lower extremities.  Back- no cva tenderness.        Assessment & Plan:

## 2014-05-09 NOTE — Telephone Encounter (Signed)
I did talk with charge nurse at main ED and advised them of pt finding on  CT and his clinical presentation.

## 2014-05-09 NOTE — Assessment & Plan Note (Signed)
For your fatigue, I will get stat cbc and cmp. I will also get ua and a culture.(In addition will get hemossure cards). Please turn cards in asap.

## 2014-05-09 NOTE — Telephone Encounter (Signed)
I talked to patient and notified him of ct findings. Hx of hydrocephalus when he was young. But no recent ct to compare. Vertebral artery finding. He has some slight balance issues so I advised him to go to the ED today(main hospital)/now. If friend available have them drive. Note when i saw pt he had no obvious gross deficits. Only minimal mild poor heal to toe balance. I will try to call main ED and notify them of pt presentation.

## 2014-05-09 NOTE — ED Provider Notes (Addendum)
CSN: [id number]     Arrival date & time 05/09/14  1511 History   First MD Initiated Contact with Patient 05/09/14 1552     Chief Complaint  Patient presents with  . Dizziness      HPI Per pt sts sent her by doctor for abnormal CT results. sts he has been having dizziness and a since of "fogginess" over the past 4 days. Denies pain Past Medical History  Diagnosis Date  . H/O: GI bleed   . Chronic neck pain   . Idiopathic scoliosis   . Colonic inertia     with chronic lifelong costipation  . Blood transfusion   . Constipation   . Arthritis    Past Surgical History  Procedure Laterality Date  . Left knee surgery  1994, 1992  . Hernia repair  1980    left inguinal hernia  . Multiple cervical fusions    . Surgery for imperforate anus  1958  . Upper gastrointestinal endoscopy  05/15/03  . Colonoscopy  11/08/2006  . Laparotomy  07/18/2011    Procedure: EXPLORATORY LAPAROTOMY;  Surgeon: Mariella SaaBenjamin T Hoxworth, MD;  Location: [facility];  Service: General;  Laterality: N/A;  lysis of adhesions entero enterostomy  . Laparotomy  07/28/2011    Procedure: EXPLORATORY LAPAROTOMY;  Surgeon: Mariella SaaBenjamin T Hoxworth, MD;  Location: [facility];  Service: General;  Laterality: N/A;   Family History  Problem Relation Age of Onset  . Alzheimer's disease Father   . Diabetes Mother    History  Substance Use Topics  . Smoking status: Never Smoker   . Smokeless tobacco: Never Used  . Alcohol Use: No    Review of Systems  Constitutional: Negative for fever, chills and unexpected weight change.  Neurological: Positive for dizziness. Negative for tremors, seizures, syncope, facial asymmetry, speech difficulty, numbness and headaches.  All other systems reviewed and are negative.   All other systems reviewed and are negative  Allergies  Review of patient's allergies indicates no known allergies.  Home Medications   Prior to Admission medications   Medication Sig Start Date End Date Taking? Authorizing  Provider  OVER THE COUNTER MEDICATION Take 1 tablet by mouth daily. "All Natural Calming Agent"   Yes Historical Provider, MD  ALPRAZolam (XANAX) 1 MG tablet Take 1 tablet (1 mg total) by mouth every morning. 05/09/14   Nelia Shiobert L Meadow Abramo, MD   BP 119/72 mmHg  Pulse 92  Temp(Src) 98 F (36.7 C) (Oral)  Resp 17  SpO2 98% Physical Exam Physical Exam  Nursing note and vitals reviewed. Constitutional: He is oriented to person, place, and time. He appears well-developed and well-nourished. No distress.  HENT:  Head: Normocephalic and atraumatic.  Eyes: Pupils are equal, round, and reactive to light.  Neck: Normal range of motion.  Cardiovascular: Normal rate and intact distal pulses.   Pulmonary/Chest: No respiratory distress.  Abdominal: Normal appearance. He exhibits no distension.  Musculoskeletal: Normal range of motion.  Neurological: He is alert and oriented to person, place, and time. No cranial nerve deficit.  No gait abnormality no weakness.  Reflexes normal.  No cerebellar findings  Skin: Skin is warm and dry. No rash noted.  Psychiatric: He has a normal mood and affect. His behavior is normal.   ED Course  Procedures (including critical care time) Labs Review Labs Reviewed  I-STAT TROPOININ, ED    Imaging Review Ct Angio Head W/cm &/or Wo Cm  05/09/2014   CLINICAL DATA:  Dizziness. Lightheaded. Unable to thank  clearly. Decreased energy. Increased anxiety.  EXAM: CT ANGIOGRAPHY HEAD AND NECK  TECHNIQUE: Multidetector CT imaging of the head and neck was performed using the standard protocol during bolus administration of intravenous contrast. Multiplanar CT image reconstructions and MIPs were obtained to evaluate the vascular anatomy. Carotid stenosis measurements (when applicable) are obtained utilizing NASCET criteria, using the distal internal carotid diameter as the denominator.  CONTRAST:  50mL OMNIPAQUE IOHEXOL 350 MG/ML SOLN  COMPARISON:  CT head without contrast from the  same day.  FINDINGS: CTA HEAD FINDINGS  The lateral and third ventricles are dilated. The fourth ventricle is of normal size. 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  The postcontrast images demonstrate no pathologic enhancement. Mild mucosal thickening is present in the maxillary sinuses and ethmoid air cells. The calvarium is intact.  The right internal carotid artery is within normal limits. The left internal carotid artery is unremarkable. The the A1 and M1 segments are normal. Anterior communicating artery is patent. Right A1 segment is dominant. The MCA bifurcations are intact. The seen MCA branch vessels demonstrate moderate distal attenuation bilaterally, worse on the right.  The left vertebral artery is the dominant vessel. The PICA origins are visualized and normal. The vertebrobasilar junction is within normal limits. The basilar artery is normal. Both posterior cerebral arteries originate from the basilar tip. There is moderate attenuation of distal PCA branch vessels bilaterally. The dural sinuses are patent. The left transverse sinus is dominant.  Review of the MIP images confirms the above findings.  CTA NECK FINDINGS  There is a common origin of the left common carotid artery in the innominate artery. Atherosclerotic calcifications are present in the aortic arch without aneurysm.  The vertebral arteries both originate from the subclavian arteries. The left vertebral artery is slightly dominant to the right. There are no significant stenoses in the neck. There is moderate focal stenosis of the right vertebral artery at the dural margin.  The right common carotid artery is within normal limits. The bifurcation is unremarkable. There is no significant stenosis.  The left common carotid artery is within normal limits. The bifurcation is somewhat low but without significant stenosis. The cervical left ICA is normal.  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  There is congenital fusion of C2-3. Congenital fusion is noted at C4-5 and also at C7-T1. Anterior vertebral body at C4 is collected centrally. There is significant curvature of the cervicothoracic junction.  Review of the MIP images confirms the above findings.  IMPRESSION: 1. Dilation of the lateral and third ventricles with normal appearance of the fourth ventricle suggesting hydrocephalus. 2. Mild to moderate focal stenosis of the right vertebral artery at the dural margin, the non dominant vessel. 3. Atherosclerotic changes at the aorta without focal stenosis. 4. The carotid bifurcations are somewhat low bilaterally, more so on the left. There is no significant stenosis. 5. Moderate distal small vessel disease in both the anterior and posterior circulation without a significant proximal stenosis, aneurysm, or branch vessel occlusion. 6. 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   Electronically Signed   By: Gennette Pachris  Mattern M.D.   On: 05/09/2014 20:30   Ct Head Wo Contrast  05/09/2014   CLINICAL DATA:  Dizziness and giddiness.  Acute confusion.  Fatigue.  EXAM: CT HEAD WITHOUT CONTRAST  TECHNIQUE: Contiguous axial images were obtained from the base of the skull through the vertex without intravenous contrast.  COMPARISON:  None.  FINDINGS: Complex anomaly at the skullbase with subluxation of the dens and spinal stenosis at the foramina magnum. This was described on a cervical MRI report of 12/21/1999. There is increased fluid posterior to the left cerebellum which may be due to hygroma or mega cisterna magna. The left cerebellum appears slightly smaller than the right.  There is moderate dilatation of the lateral and third ventricles. Fourth ventricle normal in size. The pattern suggests obstructive hydrocephalus. This may be chronic and compensated. No periventricular resorption of CSF. No obstructing mass lesion.  Increased density of the left vertebral artery. The  vessels in general are increased density likely due to atherosclerotic disease or high hematocrit.  Negative for acute infarct.  Negative for hemorrhage or mass lesion.  Mild mucosal thickening in the paranasal sinuses.  IMPRESSION: Dilated third and lateral ventricles compatible with obstructive hydrocephalus. This may be a chronic finding. Correlation with prior imaging studies may be helpful to determine the baseline. 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  Increased density the vessels specially left vertebral artery. If the patient has symptoms of acute stroke, MRI is suggested for further evaluation.   Electronically Signed   By: Marlan Palau M.D.   On: 05/09/2014 13:13   Ct Angio Neck W/cm &/or Wo/cm  05/09/2014   CLINICAL DATA:  Dizziness. Lightheaded. Unable to thank clearly. Decreased energy. Increased anxiety.  EXAM: CT ANGIOGRAPHY HEAD AND NECK  TECHNIQUE: Multidetector CT imaging of the head and neck was performed using the standard protocol during bolus administration of intravenous contrast. Multiplanar CT image reconstructions and MIPs were obtained to evaluate the vascular anatomy. Carotid stenosis measurements (when applicable) are obtained utilizing NASCET criteria, using the distal internal carotid diameter as the denominator.  CONTRAST:  50mL OMNIPAQUE IOHEXOL 350 MG/ML SOLN  COMPARISON:  CT head without contrast from the same day.  FINDINGS: CTA HEAD FINDINGS  The lateral and third ventricles are dilated. The fourth ventricle is of normal size. 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  The postcontrast images demonstrate no pathologic enhancement. Mild mucosal thickening is present in the maxillary sinuses and ethmoid air cells. The calvarium is intact.  The right internal carotid artery is within normal limits. The left internal carotid artery is unremarkable. The the  A1 and M1 segments are normal. Anterior communicating artery is patent. Right A1 segment is dominant. The MCA bifurcations are intact. The seen MCA branch vessels demonstrate moderate distal attenuation bilaterally, worse on the right.  The left vertebral artery is the dominant vessel. The PICA origins are visualized and normal. The vertebrobasilar junction is within normal limits. The basilar artery is normal. Both posterior cerebral arteries originate from the basilar tip. There is moderate attenuation of distal PCA branch vessels bilaterally. The dural sinuses are patent. The left transverse sinus is dominant.  Review of the MIP images confirms the above findings.  CTA NECK FINDINGS  There is a common origin of the left common carotid artery in the innominate artery. Atherosclerotic calcifications are present in the aortic arch without aneurysm.  The vertebral arteries both originate from the subclavian arteries. The left vertebral artery is slightly dominant to the right. There are no significant stenoses in the neck. There is moderate focal stenosis of the right vertebral artery at the dural margin.  The right common carotid artery is within normal limits. The bifurcation is unremarkable. There is no  significant stenosis.  The left common carotid artery is within normal limits. The bifurcation is somewhat low but without significant stenosis. The cervical left ICA is normal.  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 There is congenital fusion of C2-3. Congenital fusion is noted at C4-5 and also at C7-T1. Anterior vertebral body at C4 is collected centrally. There is significant curvature of the cervicothoracic junction.  Review of the MIP images confirms the above findings.  IMPRESSION: 1. Dilation of the lateral and third ventricles with normal appearance of the fourth ventricle suggesting hydrocephalus. 2. Mild to moderate focal stenosis of the right vertebral artery at the  dural margin, the non dominant vessel. 3. Atherosclerotic changes at the aorta without focal stenosis. 4. The carotid bifurcations are somewhat low bilaterally, more so on the left. There is no significant stenosis. 5. Moderate distal small vessel disease in both the anterior and posterior circulation without a significant proximal stenosis, aneurysm, or branch vessel occlusion. 6. 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   Electronically Signed   By: Gennette Pac M.D.   On: 05/09/2014 20:30     EKG Interpretation   Date/Time:  Wednesday May 09 2014 16:06:42 EST Ventricular Rate:  81 PR Interval:  63 QRS Duration: 148 QT Interval:  424 QTC Calculation: 492 R Axis:   108 Text Interpretation:  Sinus rhythm Short PR interval Consider right atrial  enlargement IVCD, consider atypical RBBB Confirmed by Dois Juarbe  MD, Allis Quirarte  ([id number]) on 05/09/2014 10:02:33 PM     She was seen and evaluated by neurology. MDM   Final diagnoses:  Dizziness and giddiness          Nelia Shi, MD 05/09/14 2211

## 2014-05-09 NOTE — Progress Notes (Signed)
Pre visit review using our clinic review tool, if applicable. No additional management support is needed unless otherwise documented below in the visit note. 

## 2014-05-09 NOTE — Consult Note (Signed)
NEURO HOSPITALIST CONSULT NOTE    Reason for Consult: 4 day sensation of "foggy headed" sensation.   HPI:                                                                                                                                          [name] is an 58 y.o. male who presented to his PCP today after he had noted 4 days of SOB, anxiety, sensation of feeling "foggy in his head".  Lab work was drawn and CMET /CBC was normal.  He was sent for a CT head which was read as dilated third ventricle compatible with hydrocephalus.  Patient states this is not new and he was born with these findings.  He denies any incontinence, gait difficulty or memory decline. Patients main complaint at this time is sensation of SOB and Anxiety.  Due to CT findings neuro was asked to see patient.   Past Medical History  Diagnosis Date  . H/O: GI bleed   . Chronic neck pain   . Idiopathic scoliosis   . Colonic inertia     with chronic lifelong costipation  . Blood transfusion   . Constipation   . Arthritis     Past Surgical History  Procedure Laterality Date  . Left knee surgery  1994, 1992  . Hernia repair  1980    left inguinal hernia  . Multiple cervical fusions    . Surgery for imperforate anus  1958  . Upper gastrointestinal endoscopy  05/15/03  . Colonoscopy  11/08/2006  . Laparotomy  07/18/2011    Procedure: EXPLORATORY LAPAROTOMY;  Surgeon: Mariella Saa, MD;  Location: [facility];  Service: General;  Laterality: N/A;  lysis of adhesions entero enterostomy  . Laparotomy  07/28/2011    Procedure: EXPLORATORY LAPAROTOMY;  Surgeon: Mariella Saa, MD;  Location: [facility];  Service: General;  Laterality: N/A;    Family History  Problem Relation Age of Onset  . Alzheimer's disease Father   . Diabetes Mother      Social History:  reports that he has never smoked. He has never used smokeless tobacco. He reports that he does not drink alcohol or use illicit  drugs.  No Known Allergies  MEDICATIONS:  No current facility-administered medications for this encounter.   Current Outpatient Prescriptions  Medication Sig Dispense Refill  . ALPRAZolam (XANAX) 1 MG tablet Take 1 mg by mouth every morning.       ROS:                                                                                                                                       History obtained from the patient  General ROS: negative for - chills, fatigue, fever, night sweats, weight gain or weight loss Psychological ROS: negative for - behavioral disorder, hallucinations, memory difficulties, mood swings or suicidal ideation Ophthalmic ROS: negative for - blurry vision, double vision, eye pain or loss of vision ENT ROS: negative for - epistaxis, nasal discharge, oral lesions, sore throat, tinnitus or vertigo Allergy and Immunology ROS: negative for - hives or itchy/watery eyes Hematological and Lymphatic ROS: negative for - bleeding problems, bruising or swollen lymph nodes Endocrine ROS: negative for - galactorrhea, hair pattern changes, polydipsia/polyuria or temperature intolerance Respiratory ROS: negative for - cough, hemoptysis, shortness of breath or wheezing Cardiovascular ROS: negative for - chest pain, dyspnea on exertion, edema or irregular heartbeat Gastrointestinal ROS: negative for - abdominal pain, diarrhea, hematemesis, nausea/vomiting or stool incontinence Genito-Urinary ROS: negative for - dysuria, hematuria, incontinence or urinary frequency/urgency Musculoskeletal ROS: negative for - joint swelling or muscular weakness Neurological ROS: as noted in HPI Dermatological ROS: negative for rash and skin lesion changes   Blood pressure 117/82, pulse 77, temperature 98.3 F (36.8 C), resp. rate 18, SpO2 100 %.   Neurologic Examination:                                                                                                       HEENT-  Normocephalic, no lesions, without obvious abnormality.  Normal external eye and conjunctiva.  Normal TM's bilaterally.  Normal auditory canals and external ears. Normal external nose, mucus membranes and septum.  Normal pharynx. Cardiovascular- regular rate and rhythm, S1, S2 normal, no murmur, click, rub or gallop, pulses palpable throughout   Lungs- chest clear, no wheezing, rales, normal symmetric air entry Abdomen- soft, non-tender; bowel sounds normal; no masses,  no organomegaly Extremities- less then 2 second capillary refill Lymph-no adenopathy palpable Musculoskeletal-no joint tenderness, deformity or swelling Skin-warm and dry, no hyperpigmentation, vitiligo, or suspicious lesions  Neurological Examination Mental Status: Alert, oriented, thought content appropriate.  Speech fluent without evidence of aphasia.  Able to follow 3 step commands without  difficulty. Cranial Nerves: II: Discs flat bilaterally; Visual fields grossly normal, pupils equal, round, reactive to light and accommodation III,IV, VI: ptosis not present, extra-ocular motions intact bilaterally V,VII: smile symmetric, facial light touch sensation normal bilaterally VIII: hearing normal bilaterally IX,X: gag reflex present XI: bilateral shoulder shrug XII: midline tongue extension Motor: Right : Upper extremity   5/5    Left:     Upper extremity   5/5  Lower extremity   5/5     Lower extremity   5/5 Tone and bulk:normal tone throughout; no atrophy noted Sensory: Pinprick and light touch intact throughout, bilaterally Deep Tendon Reflexes: 2+ and symmetric throughout Plantars: Right: downgoing   Left: downgoing Cerebellar: normal finger-to-nose, and normal heel-to-shin test Gait: normal gait , able to walk on toes and heels with no problem.  Has some difficulty with heel to toe walking but does not fall.     Lab  Results: Basic Metabolic Panel:  Recent Labs Lab 05/09/14 1148  NA 138  K 3.6  CL 105  CO2 31  GLUCOSE 88  BUN 12  CREATININE 0.9  CALCIUM 9.4    Liver Function Tests:  Recent Labs Lab 05/09/14 1148  AST 19  ALT 16  ALKPHOS 50  BILITOT 0.6  PROT 6.9  ALBUMIN 4.6   No results for input(s): LIPASE, AMYLASE in the last 168 hours. No results for input(s): AMMONIA in the last 168 hours.  CBC:  Recent Labs Lab 05/09/14 1148  WBC 6.0  NEUTROABS 4.0  HGB 14.8  HCT 44.8  MCV 86.2  PLT 272.0    Cardiac Enzymes: No results for input(s): CKTOTAL, CKMB, CKMBINDEX, TROPONINI in the last 168 hours.  Lipid Panel: No results for input(s): CHOL, TRIG, HDL, CHOLHDL, VLDL, LDLCALC in the last 168 hours.  CBG: No results for input(s): GLUCAP in the last 168 hours.  Microbiology: Results for orders placed or performed during the hospital encounter of 07/17/11  MRSA PCR Screening     Status: Abnormal   Collection Time: 07/18/11 11:49 AM  Result Value Ref Range Status   MRSA by PCR POSITIVE (A) NEGATIVE Final    Comment:        The GeneXpert MRSA Assay (FDA approved for NASAL specimens only), is one component of a comprehensive MRSA colonization surveillance program. It is not intended to diagnose MRSA infection nor to guide or monitor treatment for MRSA infections. RESULT CALLED TO, READ BACK BY AND VERIFIED WITH: Jacot E. BY GALLIMORE S. [id number] 07/18/11  MRSA PCR Screening     Status: None   Collection Time: 08/14/11 12:31 PM  Result Value Ref Range Status   MRSA by PCR NEGATIVE NEGATIVE Final    Comment:        The GeneXpert MRSA Assay (FDA approved for NASAL specimens only), is one component of a comprehensive MRSA colonization surveillance program. It is not intended to diagnose MRSA infection nor to guide or monitor treatment for MRSA infections.    Coagulation Studies: No results for input(s): LABPROT, INR in the last 72 hours.  Imaging: Ct Head  Wo Contrast  05/09/2014   CLINICAL DATA:  Dizziness and giddiness.  Acute confusion.  Fatigue.  EXAM: CT HEAD WITHOUT CONTRAST  TECHNIQUE: Contiguous axial images were obtained from the base of the skull through the vertex without intravenous contrast.  COMPARISON:  None.  FINDINGS: Complex anomaly at the skullbase with subluxation of the dens and spinal stenosis at the foramina magnum. This was described on a  cervical MRI report of 12/21/1999. There is increased fluid posterior to the left cerebellum which may be due to hygroma or mega cisterna magna. The left cerebellum appears slightly smaller than the right.  There is moderate dilatation of the lateral and third ventricles. Fourth ventricle normal in size. The pattern suggests obstructive hydrocephalus. This may be chronic and compensated. No periventricular resorption of CSF. No obstructing mass lesion.  Increased density of the left vertebral artery. The vessels in general are increased density likely due to atherosclerotic disease or high hematocrit.  Negative for acute infarct.  Negative for hemorrhage or mass lesion.  Mild mucosal thickening in the paranasal sinuses.  IMPRESSION: Dilated third and lateral ventricles compatible with obstructive hydrocephalus. This may be a chronic finding. Correlation with prior imaging studies may be helpful to determine the baseline. 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  Increased density the vessels specially left vertebral artery. If the patient has symptoms of acute stroke, MRI is suggested for further evaluation.   Electronically Signed   By: Marlan Palauharles  Mayweather M.D.   On: 05/09/2014 13:13    Felicie MornDavid Smith PA-C [facility] [phone]  05/09/2014, 4:17 PM  Patient seen and examined.  Clinical course and management discussed.  Necessary edits performed.  I agree with the above.  Assessment and  plan of care developed and discussed below.    Assessment/Plan: 58 year old male with a four day history of being SOB and "foggy in the head.  Does not report dizziness or vertigo.  Head CT personally reviewed and shows hydrocephalus and possible left vertebral thrombosis.  Patient reports a history of hydrocephalus since childhood.  There is nothing on the neurological examination or in his complaints to suggest that this is progressive.  Will confirm possibility of vertebral thrombosis since patient has a history of GIB and would not like to place on antiplatelet therapy unless medical management required.  Recommendations: 1.  CTA of the head and neck.  Further management to be determined after review.  Case discussed with Dr. Henreitta LeberBeaton  Andres Bantz, MD [facility] [phone]  05/09/2014  5:36 PM  Addendum: CTA does not confirm thrombosis of the vertebral artery but mild to moderate atherosclerosis noted.      Recommendations: 1.  ASA 325mg  daily 2.  Patient may continue follow up on an outpatient basis.    Thana FarrLeslie Oluchi Pucci, MD [facility] [phone]

## 2014-05-09 NOTE — Discharge Instructions (Signed)

## 2014-05-09 NOTE — Patient Instructions (Addendum)
For your fatigue, I will get stat cbc and cmp. I will also get ua and a culture.(In addition will get hemossure cards). Please turn cards in asap.  For the dizziness, poor heal to toe gait and reported confusion. I will order a CT stat of your head.  If this test is negative and your symptoms worsen then ED evaluation since CT do not show everything and sometimes MRI are needed. Please stay in radiology until I am called with the results and I discuss results with you.  Follow up Friday(or Monday) or as needed.  Pt cell [phone]  Note pt did not report any respiratory symptoms to me.

## 2014-05-09 NOTE — ED Notes (Signed)
Pt undressed, in gown, on monitor, continuous pulse oximetry and blood pressure cuff 

## 2014-05-09 NOTE — ED Notes (Signed)
Per pt sts sent her by doctor for abnormal CT results. sts he has been having dizziness and a since of "fogginess" over the past 4 days. Denies pain.

## 2014-05-09 NOTE — ED Notes (Signed)
Pt resting, watching tv; has no needs at this time

## 2014-05-09 NOTE — Assessment & Plan Note (Signed)
For the dizziness, poor heal to toe gait and reported confusion. I will order a CT stat of your head.  If this test is negative and your symptoms worsen then ED evaluation since CT do not show everything and sometimes MRI are needed. Please stay in radiology until I am called with the results and I discuss results with you.

## 2014-05-09 NOTE — ED Notes (Signed)
Pt brought back to room from triage; Radford PaxBeaton, MD in room with pt now

## 2014-05-11 ENCOUNTER — Ambulatory Visit: Payer: Medicaid Other | Admitting: Podiatry

## 2014-05-11 LAB — URINE CULTURE
COLONY COUNT: NO GROWTH
ORGANISM ID, BACTERIA: NO GROWTH

## 2014-05-14 ENCOUNTER — Encounter: Payer: Self-pay | Admitting: Medical

## 2014-05-14 ENCOUNTER — Ambulatory Visit (INDEPENDENT_AMBULATORY_CARE_PROVIDER_SITE_OTHER): Payer: Self-pay | Admitting: Medical

## 2014-05-14 VITALS — BP 118/79 | HR 84 | Temp 97.9°F | Ht 66.0 in | Wt 120.6 lb

## 2014-05-14 DIAGNOSIS — R42 Dizziness and giddiness: Secondary | ICD-10-CM

## 2014-05-14 DIAGNOSIS — G919 Hydrocephalus, unspecified: Secondary | ICD-10-CM

## 2014-05-14 MED ORDER — MECLIZINE HCL 12.5 MG PO TABS: 12.5000 mg | ORAL_TABLET | Freq: Three times a day (TID) | ORAL | Status: DC | PRN

## 2014-05-14 NOTE — Patient Instructions (Addendum)
You report your dizziness is improved some and by exam this appears to be the case as well. Your mild foggy headed sensation is difficult to assess in light of negative hospital work up and ct scans which did not show acute dramatic changes.  It is possible neurology visit and repeat imagine to assess change in hydrocephalus could be beneficial.   I am going to prescribe low dose meclizine today and refer you to neurology hopefully by next week.  If your symptoms worsen prior to that appointment then evaluation in ED.  Follow up here in 3 wks or as needed.

## 2014-05-14 NOTE — Progress Notes (Signed)
Subjective:    Patient ID: [name], male    DOB: 02/04/1956, 58 y.o.   MRN: [id number]  HPI  Pt in states that he is still feeling dizzy but not as much. Better than before.  Pt states that he still is feeling some level of confusion. He still feels as if he is not thinking clearly. Maybe processing things slower. He is not reporting any ha, no vomiting, no blurred vision, no gross motor or sensory function deficits. No memory deficits. No gross motor or sensory function deficits. Mild faint dizziness and foggy headed still but better.   Pt CTA of the head was negative. His bood work was negative in hospital.    Past Medical History  Diagnosis Date  . H/O: GI bleed   . Chronic neck pain   . Idiopathic scoliosis   . Colonic inertia     with chronic lifelong costipation  . Blood transfusion   . Constipation   . Arthritis     History   Social History  . Marital Status: Single    Spouse Name: N/A    Number of Children: N/A  . Years of Education: N/A   Occupational History  . Not on file.   Social History Main Topics  . Smoking status: Never Smoker   . Smokeless tobacco: Never Used  . Alcohol Use: No  . Drug Use: No  . Sexual Activity: No   Other Topics Concern  . Not on file   Social History Narrative    Past Surgical History  Procedure Laterality Date  . Left knee surgery  1994, 1992  . Hernia repair  1980    left inguinal hernia  . Multiple cervical fusions    . Surgery for imperforate anus  1958  . Upper gastrointestinal endoscopy  05/15/03  . Colonoscopy  11/08/2006  . Laparotomy  07/18/2011    Procedure: EXPLORATORY LAPAROTOMY;  Surgeon: Mariella SaaBenjamin T Hoxworth, MD;  Location: [facility];  Service: General;  Laterality: N/A;  lysis of adhesions entero enterostomy  . Laparotomy  07/28/2011    Procedure: EXPLORATORY LAPAROTOMY;  Surgeon: Mariella SaaBenjamin T Hoxworth, MD;  Location: [facility];  Service: General;  Laterality: N/A;    Family History  Problem Relation  Age of Onset  . Alzheimer's disease Father   . Diabetes Mother     No Known Allergies  Current Outpatient Prescriptions on File Prior to Visit  Medication Sig Dispense Refill  . ALPRAZolam (XANAX) 1 MG tablet Take 1 tablet (1 mg total) by mouth every morning. 30 tablet 0  . OVER THE COUNTER MEDICATION Take 1 tablet by mouth daily. "All Natural Calming Agent"     No current facility-administered medications on file prior to visit.    BP 118/79 mmHg  Pulse 84  Temp(Src) 97.9 F (36.6 C) (Oral)  Ht 5\' 6"  (1.676 m)  Wt 120 lb 9.6 oz (54.704 kg)  BMI 19.47 kg/m2  SpO2 98%           Review of Systems  Constitutional: Negative for fever, chills, diaphoresis, activity change and fatigue.  Respiratory: Negative for cough, chest tightness and shortness of breath.   Cardiovascular: Negative for chest pain, palpitations and leg swelling.  Gastrointestinal: Negative for nausea, vomiting and abdominal pain.  Musculoskeletal: Negative for neck pain and neck stiffness.  Neurological: Positive for dizziness. Negative for tremors, seizures, syncope, facial asymmetry, speech difficulty, weakness, light-headedness, numbness and headaches.  Psychiatric/Behavioral: Negative for behavioral problems, confusion and agitation. The  patient is not nervous/anxious.        Objective:   Physical Exam  General Mental Status- Alert. General Appearance- Not in acute distress.   Skin General: Color- Normal Color. Moisture- Normal Moisture.  Neck Carotid Arteries- Normal color. Moisture- Normal Moisture. No carotid bruits. No JVD.  Chest and Lung Exam Auscultation: Breath Sounds:-Normal.  Cardiovascular Auscultation:Rythm- Regular. Murmurs & Other Heart Sounds:Auscultation of the heart reveals- No Murmurs.  Abdomen Inspection:-Inspeection Normal. Palpation/Percussion:Note:No mass. Palpation and Percussion of the abdomen reveal- Non Tender, Non Distended + BS, no rebound or  guarding.    Neurologic Cranial Nerve exam:- CN III-XII intact(No nystagmus), symmetric smile. Drift Test:- No drift. Romberg Exam:- Negative.  Heal to Toe Gait exam:-Better than last time. Can now do without loosing balance. Finger to Nose:- Normal/Intact Strength:- 5/5 equal and symmetric strength both upper and lower extremities.       Assessment & Plan:

## 2014-05-14 NOTE — Progress Notes (Signed)
Pre visit review using our clinic review tool, if applicable. No additional management support is needed unless otherwise documented below in the visit note. 

## 2014-05-14 NOTE — Assessment & Plan Note (Signed)
You report your dizziness is improved some and by exam this appears to be the case as well. Your mild foggy headed sensation is difficult to assess in light of negative hospital work up and ct scans which did not show acute dramatic changes.  It is possible neurology visit and repeat imagine to assess change in hydrocephalus could be beneficial.   I am going to prescribe low dose meclizine today and refer you to neurology hopefully by next week.  If your symptoms worsen prior to that appointment then evaluation in ED.

## 2014-05-31 ENCOUNTER — Emergency Department (HOSPITAL_COMMUNITY)
Admission: EM | Admit: 2014-05-31 | Discharge: 2014-05-31 | Disposition: A | Discharge status: Home / Self Care | Payer: Medicaid Other | Attending: Emergency Medicine | Admitting: Emergency Medicine

## 2014-05-31 ENCOUNTER — Encounter (HOSPITAL_COMMUNITY): Payer: Self-pay | Admitting: Cardiology

## 2014-05-31 DIAGNOSIS — Z8739 Personal history of other diseases of the musculoskeletal system and connective tissue: Secondary | ICD-10-CM | POA: Insufficient documentation

## 2014-05-31 DIAGNOSIS — G919 Hydrocephalus, unspecified: Secondary | ICD-10-CM

## 2014-05-31 DIAGNOSIS — G8929 Other chronic pain: Secondary | ICD-10-CM | POA: Insufficient documentation

## 2014-05-31 DIAGNOSIS — Z8719 Personal history of other diseases of the digestive system: Secondary | ICD-10-CM | POA: Insufficient documentation

## 2014-05-31 DIAGNOSIS — Z79899 Other long term (current) drug therapy: Secondary | ICD-10-CM | POA: Insufficient documentation

## 2014-05-31 NOTE — ED Notes (Signed)
Pt reports that he was diagnosed with hydrocephaly and was told that he should follow up with a specialist but has to wait til feb. Pt reports that he feels like he is in a fog, and not able to think straight.

## 2014-05-31 NOTE — ED Provider Notes (Signed)
CSN: [id number]     Arrival date & time 05/31/14  1349 History   First MD Initiated Contact with Patient 05/31/14 1434     Chief Complaint  Patient presents with  . Headache   (Consider location/radiation/quality/duration/timing/severity/associated sxs/prior Treatment) HPI [name] is a 59 yo male presenting with report of feeling unclear with his thinking x 2 days.  He reports he was diagnosed with hydrocephalous last month and has a follow-up appointment with neurology next month.  He feels anxious about the findings and reports feeling concern that he can't see the neurologist sooner. He reports he tried to walk heel to toe at home yesterday and felt like he did not do it well.  He denies any pain or headache at anytime.  He denies any nausea, vomiting, blurred vision, focal weakness or focal deficit.   Past Medical History  Diagnosis Date  . H/O: GI bleed   . Chronic neck pain   . Idiopathic scoliosis   . Colonic inertia     with chronic lifelong costipation  . Blood transfusion   . Constipation   . Arthritis    Past Surgical History  Procedure Laterality Date  . Left knee surgery  1994, 1992  . Hernia repair  1980    left inguinal hernia  . Multiple cervical fusions    . Surgery for imperforate anus  1958  . Upper gastrointestinal endoscopy  05/15/03  . Colonoscopy  11/08/2006  . Laparotomy  07/18/2011    Procedure: EXPLORATORY LAPAROTOMY;  Surgeon: Mariella SaaBenjamin T Hoxworth, MD;  Location: [facility];  Service: General;  Laterality: N/A;  lysis of adhesions entero enterostomy  . Laparotomy  07/28/2011    Procedure: EXPLORATORY LAPAROTOMY;  Surgeon: Mariella SaaBenjamin T Hoxworth, MD;  Location: [facility];  Service: General;  Laterality: N/A;   Family History  Problem Relation Age of Onset  . Alzheimer's disease Father   . Diabetes Mother    History  Substance Use Topics  . Smoking status: Never Smoker   . Smokeless tobacco: Never Used  . Alcohol Use: No    Review of Systems   Constitutional: Negative for fever and chills.  HENT: Negative for sore throat.   Eyes: Negative for visual disturbance.  Respiratory: Negative for cough and shortness of breath.   Cardiovascular: Negative for chest pain and leg swelling.  Gastrointestinal: Negative for nausea, vomiting and diarrhea.  Genitourinary: Negative for dysuria.  Musculoskeletal: Negative for myalgias.  Skin: Negative for rash.  Neurological: Negative for dizziness, speech difficulty, weakness, numbness and headaches.       Unclear thinking      Allergies  Review of patient's allergies indicates no known allergies.  Home Medications   Prior to Admission medications   Medication Sig Start Date End Date Taking? Authorizing Provider  ALPRAZolam Prudy Feeler(XANAX) 1 MG tablet Take 1 tablet (1 mg total) by mouth every morning. 05/09/14   Nelia Shiobert L Beaton, MD  meclizine (ANTIVERT) 12.5 MG tablet Take 1 tablet (12.5 mg total) by mouth 3 (three) times daily as needed for dizziness. 05/14/14   Bayard BeaverEdward M Saguier, PA-C  OVER THE COUNTER MEDICATION Take 1 tablet by mouth daily. "All Natural Calming Agent"    Historical Provider, MD   BP 114/79 mmHg  Pulse 76  Temp(Src) 98.1 F (36.7 C) (Oral)  Resp 16  Wt 120 lb (54.432 kg)  SpO2 100% Physical Exam  Constitutional: He is oriented to person, place, and time. He appears well-developed and well-nourished. No distress.  HENT:  Head: Atraumatic. Macrocephalic.  Mouth/Throat: Oropharynx is clear and moist. No oropharyngeal exudate.  Eyes: Conjunctivae are normal.  Neck: Neck supple. No thyromegaly present.  Cardiovascular: Normal rate, regular rhythm and intact distal pulses.   Pulmonary/Chest: Effort normal and breath sounds normal. No respiratory distress. He has no wheezes. He has no rales. He exhibits no tenderness.  Abdominal: Soft. There is no tenderness.  Musculoskeletal: He exhibits no tenderness.  Lymphadenopathy:    He has no cervical adenopathy.  Neurological: He  is alert and oriented to person, place, and time. He has normal strength. No cranial nerve deficit or sensory deficit. He displays a negative Romberg sign. Coordination and gait normal. GCS eye subscore is 4. GCS verbal subscore is 5. GCS motor subscore is 6.  Reflex Scores:      Patellar reflexes are 2+ on the right side and 2+ on the left side. Cranial nerves 2-12 intact. normal heel to toe walk, finger to nose, pronator drift, rapid alternating finger movement and rapid alternating hand movement.     Skin: Skin is warm and dry. No rash noted. He is not diaphoretic.  Psychiatric: He has a normal mood and affect.  Nursing note and vitals reviewed.   ED Course  Procedures (including critical care time) Labs Review Labs Reviewed - No data to display  Imaging Review No results found.   EKG Interpretation None      MDM   Final diagnoses:  Hydrocephalus   59 yo with congenital hydrocephalus recently imaged and evaluated two weeks ago because of report of unclear thinking. Pt has follow-up with neurologist next month but is anxious and would like follow-up appointment sooner because he still feels his thinking is unclear. Discussed case with Dr. Blinda Leatherwood. He denies any changes, any fevers, blurred vision, focal deficits, vomiting or headaches.  His neuro exam is normal and he is afebrile. Pt is well-appearing, in no acute distress and vital signs are stable.  They appear safe to be discharged. He is reassured by his neuro exam and is in agreement to be discharged and follow-up with neurologist. Return precautions provided.    Filed Vitals:   05/31/14 1445 05/31/14 1500 05/31/14 1530 05/31/14 1541  BP: 114/79 112/85 110/75 110/75  Pulse: 76 68 71 77  Temp:      TempSrc:      Resp: Weight:      SpO2: 100% 100% 99% 100%   Meds given in ED:  Medications - No data to display  New Prescriptions   No medications on file       Harle Battiest, NP 06/02/14  0840  Gilda Crease, MD 06/03/14 [id number]

## 2014-05-31 NOTE — Discharge Instructions (Signed)
Follow directions provided. Be sure to follow-up with your primary care doctor and keep urology appointment for further management of your hydrocephalus. Don't hesitate to return for any new, worsening, or concerning symptoms.  SEEK IMMEDIATE MEDICAL CARE IF:  Your headache becomes severe.  You have a fever.  You have a stiff neck.  You have loss of vision.  You have muscular weakness or loss of muscle control.  You start losing your balance or have trouble walking.  You feel faint or pass out.  You have severe symptoms that are different from your first symptoms.

## 2014-06-26 ENCOUNTER — Telehealth: Payer: Self-pay | Admitting: Neurology

## 2014-06-26 NOTE — Progress Notes (Signed)
error 

## 2014-06-26 NOTE — Telephone Encounter (Signed)
Pt called to r/s his NP appt from 06/29/14 to 08/14/14.Dr. Alvira MondaySaguier/ referring provider was notified.

## 2014-06-28 ENCOUNTER — Telehealth: Payer: Self-pay | Admitting: Internal Medicine

## 2014-06-28 NOTE — Telephone Encounter (Signed)
Caller name: Fabio BeringClark, Eriq N Relation to pt: self  Call back [phone] [facility]acy: [id number] [place] - [id number] [facility] [address] [phone] (Phone) [phone] (Fax)    Reason for call:  Pt states he really does not want to come in for an appointment he has a bad cough in need of a "really strong cough syrup" please advise

## 2014-06-28 NOTE — Telephone Encounter (Signed)
Spoke with Pt, informed him of Dr. Paz recommendations. Pt verbalized understanding.  

## 2014-06-28 NOTE — Telephone Encounter (Signed)
Please advise 

## 2014-06-28 NOTE — Telephone Encounter (Signed)
Recommend rest, fluids, Tylenol, Mucinex DM twice a day as needed for cough and Flonase 2 sprays in each side of the nose daily. If not gradually improving or if he feels very ill ---->  needs to be seen

## 2014-06-29 ENCOUNTER — Ambulatory Visit: Payer: Self-pay | Admitting: Neurology

## 2014-06-29 ENCOUNTER — Ambulatory Visit: Payer: Medicaid Other | Admitting: Neurology

## 2014-06-29 ENCOUNTER — Telehealth: Payer: Self-pay | Admitting: Neurology

## 2014-06-29 NOTE — Telephone Encounter (Signed)
Pt called to cancel today's NP appt. Pt is sick and did not want to r/s at this moment.  Dr. Rolin BarrySaguier/referring provider was notified.

## 2014-06-29 NOTE — Telephone Encounter (Signed)
Noted, appt will be marked as a no show b/c pt called the day of to cancel but a no show letter will not be sent / Sherri S.

## 2014-07-06 ENCOUNTER — Telehealth: Payer: Self-pay | Admitting: Neurology

## 2014-07-06 NOTE — Telephone Encounter (Signed)
Upcoming new patient appt. Per pt he is having issues with "brain fog" due to hydrocephalus. He believes he has heard of a med that can help with brain fog. He wants to know if we have heard of it before? Please call [phone] / Sherri S.

## 2014-07-06 NOTE — Telephone Encounter (Signed)
I explained to patient that I was not sure about medication for Brain Fog this was something he could discuss with Dr Everlena CooperJaffe at his visit

## 2014-07-23 ENCOUNTER — Encounter: Payer: Self-pay | Admitting: Internal Medicine

## 2014-07-23 ENCOUNTER — Ambulatory Visit (INDEPENDENT_AMBULATORY_CARE_PROVIDER_SITE_OTHER): Payer: Self-pay | Admitting: Internal Medicine

## 2014-07-23 VITALS — BP 113/69 | HR 83 | Temp 97.9°F | Ht 66.0 in | Wt 128.1 lb

## 2014-07-23 DIAGNOSIS — F329 Major depressive disorder, single episode, unspecified: Secondary | ICD-10-CM

## 2014-07-23 DIAGNOSIS — R42 Dizziness and giddiness: Secondary | ICD-10-CM

## 2014-07-23 DIAGNOSIS — Z23 Encounter for immunization: Secondary | ICD-10-CM

## 2014-07-23 DIAGNOSIS — Z0000 Encounter for general adult medical examination without abnormal findings: Secondary | ICD-10-CM

## 2014-07-23 DIAGNOSIS — Q421 Congenital absence, atresia and stenosis of rectum without fistula: Secondary | ICD-10-CM

## 2014-07-23 DIAGNOSIS — Q7649 Other congenital malformations of spine, not associated with scoliosis: Secondary | ICD-10-CM

## 2014-07-23 DIAGNOSIS — G919 Hydrocephalus, unspecified: Secondary | ICD-10-CM

## 2014-07-23 DIAGNOSIS — Q428 Congenital absence, atresia and stenosis of other parts of large intestine: Secondary | ICD-10-CM

## 2014-07-23 DIAGNOSIS — F419 Anxiety disorder, unspecified: Secondary | ICD-10-CM

## 2014-07-23 DIAGNOSIS — F411 Generalized anxiety disorder: Secondary | ICD-10-CM

## 2014-07-23 DIAGNOSIS — F32A Depression, unspecified: Secondary | ICD-10-CM

## 2014-07-23 DIAGNOSIS — Q423 Congenital absence, atresia and stenosis of anus without fistula: Secondary | ICD-10-CM

## 2014-07-23 HISTORY — DX: Hydrocephalus, unspecified: G91.9

## 2014-07-23 NOTE — Patient Instructions (Signed)
  Please schedule labs to be done within few days (fasting) TSH , PSA and FLP  Please go to the front and schedule a visit in 6 months for a a checkup

## 2014-07-23 NOTE — Progress Notes (Signed)
Subjective:    Patient ID: [name], male    DOB: 03/22/1956, 59 y.o.   MRN: [id number]  DOS:  07/23/2014 Type of visit - description : CPX Interval history: This is the first formal visit with me, extensive chart review. In general feels well, at baseline.   Review of Systems  Constitutional: No fever, chills. No unexplained wt changes. No unusual sweats HEENT: + dental problems, ear discharge, facial swelling, voice changes. No eye discharge, redness or intolerance to light Respiratory: No wheezing or difficulty breathing. No cough , mucus production Cardiovascular: No CP, leg swelling or palpitations GI: no nausea, vomiting, diarrhea or abdominal pain.  + Fecal incontinence sometimes. No blood in the stools. No dysphagia   Endocrine: No polyphagia, polyuria or polydipsia GU: No dysuria, gross hematuria, difficulty urinating. No urinary urgency or frequency. Musculoskeletal: No joint swellings or unusual aches or pains Skin: No change in the color of the skin, palor or rash Allergic, immunologic: No environmental allergies or food allergies Neurological:  Chronic on and off dizziness dizziness  . No headaches. No diplopia, slurred speech, motor deficits, facial numbness Hematological: No enlarged lymph nodes, easy bruising or bleeding Psychiatry: No suicidal ideas, hallucinations, behavior problems or confusion. No unusual/severe anxiety or depression.    Past Medical History  Diagnosis Date  . H/O: GI bleed   . Idiopathic scoliosis   . Colonic inertia     with chronic lifelong costipation  . Congenital fusion of cervical spine   . 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 Anxiety and depression 03/14/2010  . FECAL INCONTINENCE 08/19/2007  . Hydrocephalus --per CT 04/2014 07/23/2014    Past Surgical History  Procedure Laterality Date  . Left knee surgery  1994, 1992  . Hernia repair  1980    left inguinal hernia  . Surgery for imperforate anus  1958  . Upper  gastrointestinal endoscopy  05/15/03  . Colonoscopy  11/08/2006  . Laparotomy  07/18/2011    Procedure: EXPLORATORY LAPAROTOMY;  Surgeon: Mariella SaaBenjamin T Hoxworth, MD;  Location: [facility];  Service: General;  Laterality: N/A;  lysis of adhesions entero enterostomy  . Laparotomy  07/28/2011    Procedure: EXPLORATORY LAPAROTOMY;  Surgeon: Mariella SaaBenjamin T Hoxworth, MD;  Location: [facility];  Service: General;  Laterality: N/A;    History   Social History  . Marital Status: Single    Spouse Name: N/A  . Number of Children: 0  . Years of Education: N/A   Occupational History  . 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 Smoking status: Never Smoker   . Smokeless tobacco: Never Used  . Alcohol Use: 0.0 oz/week    0 Standard drinks or equivalent per week     Comment: socially   . Drug Use: No  . Sexual Activity: No   Other Topics Concern  . Not on file   Social History Narrative   Lives w/ mother , has a younger brother and sister      Family History  Problem Relation Age of Onset  . Alzheimer's disease Father   . Diabetes Mother   . Colon cancer Neg Hx   . Prostate cancer Neg Hx        Medication List       This list is accurate as of: 07/23/14 11:59 PM.  Always use your most recent med list.               ALPRAZolam  1 MG tablet  Commonly known as:  XANAX  Take 1 tablet (1 mg total) by mouth every morning.     meclizine 12.5 MG tablet  Commonly known as:  ANTIVERT  Take 1 tablet (12.5 mg total) by mouth 3 (three) times daily as needed for dizziness.     nortriptyline 50 MG capsule  Commonly known as:  PAMELOR  Take 50 mg by mouth 2 (two) times daily.           Objective:   Physical Exam BP 113/69 mmHg  Pulse 83  Temp(Src) 97.9 F (36.6 C) (Oral)  Ht  (1.676 m)  Wt 128 lb 2 oz (58.117 kg)  BMI 20.69 kg/m2  SpO2 98% General:   Well developed, well nourished . NAD.  Neck:  Full range of motion. Supple. No  thyromegaly ,  normal carotid pulse, no LAD. HEENT:  Normocephalic . Face symmetric, atraumatic Lungs:  CTA B Normal respiratory effort, no intercostal retractions, no accessory muscle use. Heart: RRR,  no murmur.  Abdomen:  Not distended, soft, non-tender. No rebound or rigidity. No mass,organomegaly. Healed surgical scars Muscle skeletal: no pretibial edema bilaterally  GU--  history of unperforated anus status post repair, we agreed to hold   DRE unless PSA came back abnormal. Skin: Exposed areas without rash. Not pale. Not jaundice Neurologic:  alert & oriented X3.  Speech normal, gait appropriate for age and unassisted Strength symmetric and appropriate for age.  Psych: Cognition and judgment appear intact.  Cooperative with normal attention span and concentration.  Behavior appropriate. No anxious or depressed appearing.      Assessment & Plan:

## 2014-07-23 NOTE — Assessment & Plan Note (Signed)
Td today Prostate cancer screening, we agreed on no DRE, will check a PSA. Colonoscopy 2008 no polyps, next per GI Diet and exercise discussed Will check FLP, PSA and TSH. Follow-up 6 months.

## 2014-07-23 NOTE — Progress Notes (Signed)
Pre visit review using our clinic review tool, if applicable. No additional management support is needed unless otherwise documented below in the visit note. 

## 2014-07-23 NOTE — Assessment & Plan Note (Signed)
On pamelor as needed

## 2014-07-25 ENCOUNTER — Other Ambulatory Visit: Payer: Self-pay

## 2014-07-25 NOTE — Assessment & Plan Note (Signed)
To see neurology.  

## 2014-07-25 NOTE — Assessment & Plan Note (Signed)
per Dr. Evelene CroonKaur, symptoms well-controlled

## 2014-07-26 ENCOUNTER — Other Ambulatory Visit (INDEPENDENT_AMBULATORY_CARE_PROVIDER_SITE_OTHER): Payer: Self-pay

## 2014-07-26 DIAGNOSIS — Z0000 Encounter for general adult medical examination without abnormal findings: Secondary | ICD-10-CM

## 2014-07-26 LAB — LIPID PANEL
CHOL/HDL RATIO: 3
Cholesterol: 181 mg/dL (ref 0–200)
HDL: 69.5 mg/dL (ref >39.00)
LDL Cholesterol: 98 mg/dL (ref 0–99)
NONHDL: 111.5
Triglycerides: 67 mg/dL (ref 0.0–149.0)
VLDL: 13.4 mg/dL (ref 0.0–40.0)

## 2014-07-26 LAB — TSH: TSH: 1.6 u[IU]/mL (ref 0.35–4.50)

## 2014-07-26 LAB — PSA: PSA: 0.19 ng/mL (ref 0.10–4.00)

## 2014-08-14 ENCOUNTER — Ambulatory Visit: Payer: Self-pay | Admitting: Neurology

## 2014-08-22 ENCOUNTER — Encounter: Payer: Self-pay | Admitting: Neurology

## 2014-08-22 ENCOUNTER — Ambulatory Visit (INDEPENDENT_AMBULATORY_CARE_PROVIDER_SITE_OTHER): Payer: Medicaid Other | Admitting: Neurology

## 2014-08-22 VITALS — BP 126/68 | HR 82 | Temp 97.9°F | Resp 16 | Ht 66.0 in | Wt 120.7 lb

## 2014-08-22 DIAGNOSIS — Q039 Congenital hydrocephalus, unspecified: Secondary | ICD-10-CM

## 2014-08-22 DIAGNOSIS — R42 Dizziness and giddiness: Secondary | ICD-10-CM | POA: Diagnosis not present

## 2014-08-22 NOTE — Progress Notes (Signed)
NEUROLOGY CONSULTATION NOTE  [name] MRN: [id number] DOB: Sep 09, 1955  Referring provider: Kristine Garbe, PA-C Primary care provider: [name], MD  Reason for consult:  hydrocephalus  HISTORY OF PRESENT ILLNESS: [name] is a 59 year old right-handed man with history of GI bleed who presents for hydrocephalus.  Records, labs and CT and CTA reviewed.  In December, he developed an episode of dizziness, which he describes as a "lightheadedness".  It was not particularly positional.  There was no sensation of spinning, headache, double vision, nausea, slurred speech or focal numbness or weakness.  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  No periventricular resorption of CSF or mass lesion noted.  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  He was advised to go to the ED.  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  Following the ED visit, the dizziness resolved.  He has known hydrocephalus since birth, which never required intervention.  At baseline, he does not have headache, visual disturbance or unsteady gait.  At this point, he is doing well.  PAST MEDICAL HISTORY: Past Medical History  Diagnosis Date  . H/O: GI bleed   . Idiopathic scoliosis   . Colonic inertia     with chronic lifelong costipation  . Congenital fusion of cervical spine   . 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 Anxiety and depression 03/14/2010  . FECAL INCONTINENCE 08/19/2007  . Hydrocephalus --per CT 04/2014 07/23/2014    PAST SURGICAL HISTORY: Past Surgical History  Procedure Laterality Date  . Left knee surgery  1994, 1992  . Hernia  repair  1980    left inguinal hernia  . Surgery for imperforate anus  1958  . Upper gastrointestinal endoscopy  05/15/03  . Colonoscopy  11/08/2006  . Laparotomy  07/18/2011    Procedure: EXPLORATORY LAPAROTOMY;  Surgeon: Mariella Saa, MD;  Location: [facility];  Service: General;  Laterality: N/A;  lysis of adhesions entero enterostomy  . Laparotomy  07/28/2011    Procedure: EXPLORATORY LAPAROTOMY;  Surgeon: Mariella Saa, MD;  Location: [facility];  Service: General;  Laterality: N/A;    MEDICATIONS: Current Outpatient Prescriptions on File Prior to Visit  Medication Sig Dispense Refill  . ALPRAZolam (XANAX) 1 MG tablet Take 1 tablet (1 mg total) by mouth every morning. 30 tablet 0  . meclizine (ANTIVERT) 12.5 MG tablet Take 1 tablet (12.5 mg total) by mouth 3 (three) times daily as needed for dizziness. (Patient not taking: Reported on 05/31/2014) 21 tablet 0  . nortriptyline (PAMELOR) 50 MG capsule Take 50 mg by mouth 2 (two) times daily.     No current facility-administered medications on file prior to visit.    ALLERGIES: No Known Allergies  FAMILY HISTORY: Family History  Problem Relation Age of Onset  . Alzheimer's disease Father   . Diabetes Mother   . Colon cancer Neg Hx   . Prostate cancer Neg Hx     SOCIAL HISTORY: History   Social History  . Marital Status: Single    Spouse Name: N/A  . Number of Children: 0  . Years of Education: N/A   Occupational History  .  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 Smoking status: Never Smoker   . Smokeless tobacco: Never Used  . Alcohol Use: No     Comment: socially   . Drug Use: No  . Sexual Activity: No   Other Topics Concern  . Not on file   Social History Narrative   Lives w/ mother , has a younger brother and sister     REVIEW OF SYSTEMS: Constitutional: No fevers, chills, or sweats, no generalized fatigue, change in appetite Eyes: No visual changes, double  vision, eye pain Ear, nose and throat: No hearing loss, ear pain, nasal congestion, sore throat Cardiovascular: No chest pain, palpitations Respiratory:  No shortness of breath at rest or with exertion, wheezes GastrointestinaI: No nausea, vomiting, diarrhea, abdominal pain, fecal incontinence Genitourinary:  No dysuria, urinary retention or frequency Musculoskeletal:  No neck pain, back pain Integumentary: No rash, pruritus, skin lesions Neurological: as above Psychiatric: No depression, insomnia, anxiety Endocrine: No palpitations, fatigue, diaphoresis, mood swings, change in appetite, change in weight, increased thirst Hematologic/Lymphatic:  No anemia, purpura, petechiae. Allergic/Immunologic: no itchy/runny eyes, nasal congestion, recent allergic reactions, rashes  PHYSICAL EXAM: Filed Vitals:   08/22/14 1305  BP: 126/68  Pulse: 82  Temp: 97.9 F (36.6 C)  Resp: 16   General: No acute distress Head:  Macrocephalic Eyes:  fundi unremarkable, without vessel changes, exudates, hemorrhages or papilledema. Neck: supple, no paraspinal tenderness, full range of motion Back: No paraspinal tenderness Heart: regular rate and rhythm Lungs: Clear to auscultation bilaterally. Vascular: No carotid bruits. Neurological Exam: Mental status: alert and oriented to person, place, and time, recent and remote memory intact, fund of knowledge intact, attention and concentration intact, speech fluent and not dysarthric, language intact. Cranial nerves: CN I: not tested CN II: pupils equal, round and reactive to light, visual fields intact, fundi unremarkable, without vessel changes, exudates, hemorrhages or papilledema. CN III, IV, VI:  full range of motion, no nystagmus, no ptosis CN V: facial sensation intact CN VII: upper and lower face symmetric CN VIII: hearing intact CN IX, X: gag intact, uvula midline CN XI: sternocleidomastoid and trapezius muscles intact CN XII: tongue midline Bulk  & Tone: normal, no fasciculations. Motor:  5/5 throughout Sensation:  Temperature and vibration intact Deep Tendon Reflexes:  2+ throughout, toes downgoing. Finger to nose testing:  No dysmetria Heel to shin:  No dysmetria Gait:  Normal station and stride.  Able to turn and walk in tandem. Romberg negative.  IMPRESSION: Hydrocephalus.  He has a known congenital hydrocephalus which appears asymptomatic. Dizziness/lightheadedness, resolved.  Etiology unclear.  I cannot say it is neurologic as the symptoms are quite vague.  I do not think the symptoms were related to the hydrocephalus  PLAN: No further workup or follow up needed.  Thank you for allowing me to take part in the care of this patient.  Shon MilletAdam Jadavion Spoelstra, DO  CC:  Willow OraJose Paz, MD  Esperanza RichtersEdward Saguier, PA-C

## 2014-08-22 NOTE — Patient Instructions (Signed)
I don't know the cause of the dizziness as the symptoms are vague.   Hopefully, it won't occur again.  I definitely don't think it is related to the hydrocephalus.  I think the hydrocephalus is likely stable and there doesn't seem to be a need to do anything about it.

## 2014-11-19 ENCOUNTER — Ambulatory Visit: Payer: Self-pay | Admitting: Medical

## 2014-11-20 ENCOUNTER — Encounter: Payer: Self-pay | Admitting: Medical

## 2014-11-20 ENCOUNTER — Ambulatory Visit: Payer: Self-pay | Admitting: Internal Medicine

## 2014-11-20 NOTE — Progress Notes (Signed)
This encounter was created in error - please disregard.

## 2014-11-21 ENCOUNTER — Telehealth: Payer: Self-pay | Admitting: Internal Medicine

## 2014-11-21 ENCOUNTER — Ambulatory Visit (HOSPITAL_BASED_OUTPATIENT_CLINIC_OR_DEPARTMENT_OTHER)
Admission: RE | Admit: 2014-11-21 | Discharge: 2014-11-21 | Disposition: A | Discharge status: Home / Self Care | Payer: Medicare Other | Source: Ambulatory Visit | Attending: Medical | Admitting: Medical

## 2014-11-21 ENCOUNTER — Encounter: Payer: Self-pay | Admitting: Medical

## 2014-11-21 ENCOUNTER — Ambulatory Visit (INDEPENDENT_AMBULATORY_CARE_PROVIDER_SITE_OTHER): Payer: Medicare Other | Admitting: Medical

## 2014-11-21 VITALS — BP 117/86 | HR 94 | Temp 98.1°F | Ht 66.0 in | Wt 122.0 lb

## 2014-11-21 DIAGNOSIS — K5900 Constipation, unspecified: Secondary | ICD-10-CM | POA: Diagnosis present

## 2014-11-21 DIAGNOSIS — K5909 Other constipation: Secondary | ICD-10-CM

## 2014-11-21 LAB — CBC WITH DIFFERENTIAL/PLATELET
Basophils Absolute: 0.1 10*3/uL (ref 0.0–0.1)
Basophils Relative: 0.9 % (ref 0.0–3.0)
EOS PCT: 5.5 % — AB (ref 0.0–5.0)
Eosinophils Absolute: 0.3 10*3/uL (ref 0.0–0.7)
HCT: 45.1 % (ref 39.0–52.0)
HEMOGLOBIN: 14.9 g/dL (ref 13.0–17.0)
LYMPHS PCT: 23 % (ref 12.0–46.0)
Lymphs Abs: 1.3 10*3/uL (ref 0.7–4.0)
MCHC: 33 g/dL (ref 30.0–36.0)
MCV: 85.3 fl (ref 78.0–100.0)
MONOS PCT: 5.6 % (ref 3.0–12.0)
Monocytes Absolute: 0.3 10*3/uL (ref 0.1–1.0)
Neutro Abs: 3.7 10*3/uL (ref 1.4–7.7)
Neutrophils Relative %: 65 % (ref 43.0–77.0)
Platelets: 274 10*3/uL (ref 150.0–400.0)
RBC: 5.28 Mil/uL (ref 4.22–5.81)
RDW: 14.1 % (ref 11.5–15.5)
WBC: 5.7 10*3/uL (ref 4.0–10.5)

## 2014-11-21 LAB — COMPREHENSIVE METABOLIC PANEL
ALK PHOS: 58 U/L (ref 39–117)
ALT: 11 U/L (ref 0–53)
AST: 18 U/L (ref 0–37)
Albumin: 4.6 g/dL (ref 3.5–5.2)
BUN: 9 mg/dL (ref 6–23)
CALCIUM: 9.6 mg/dL (ref 8.4–10.5)
CO2: 30 mEq/L (ref 19–32)
Chloride: 104 mEq/L (ref 96–112)
Creatinine, Ser: 0.98 mg/dL (ref 0.40–1.50)
GFR: 83.52 mL/min (ref >60.00)
GLUCOSE: 95 mg/dL (ref 70–99)
Potassium: 3.8 mEq/L (ref 3.5–5.1)
SODIUM: 140 meq/L (ref 135–145)
TOTAL PROTEIN: 7.2 g/dL (ref 6.0–8.3)
Total Bilirubin: 0.3 mg/dL (ref 0.2–1.2)

## 2014-11-21 NOTE — Progress Notes (Signed)
Pre visit review using our clinic review tool, if applicable. No additional management support is needed unless otherwise documented below in the visit note. 

## 2014-11-21 NOTE — Telephone Encounter (Signed)
Pt was no show 11/20/14 9:00am with Ramon DredgeEdward, acute appt, pt left VM at 9:00am and said he wanted to reschedule to the afternoon. Appt was rescheduled for 6/28 1:00pm, I believe based on history in appts that pt was late for the appt and came in at 1:45pm. Pt was rescheduled for today at 1:00pm with Ramon DredgeEdward and did arrive for appt.  Charge for 11/20/14 9:00am with Ramon DredgeEdward? Charge for 11/20/14 1:00pm with Dr. Drue NovelPaz?

## 2014-11-21 NOTE — Telephone Encounter (Signed)
No for me

## 2014-11-21 NOTE — Patient Instructions (Addendum)
Constipation With concern for possible early obstruction. Will get cbc and cmp stat now. Will send pt down for abdominal xray. Advise to stay stay down in radiology until I get stat results on xray. May need to get Ct of abdomen/pelvis. Presently did try to call Dr. 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 So awaiting her call and at present pt will go to lab and xray.    I did talk with Dr. Juanda ChanceBrodie GI. She agreed with plan of getting stat cbc, cmp and abd xray/kub. If not showing evideince of obstruction then get mag citrate otc., use 1 bottle every day for next 3 days. If k less than 3.5 on cmp then rx k-dur 20 meq and recheck cmp on Tuesday am.   Call office tomorrow afternoon and give update on if has bm. If no bm by tommorow and if worsening bloating feeling or pain then would advise to go ahead and go to ED for CT abdomen.  Follow up Tuesday am for cmp.(imporant to make sure k is in line)   Note I did call pt and had to leave a message covering above instruction. I asked him to call me tomorrow to verify and go over the instruction I left by phone. Will try to call him later tonight. In addition I did go over lab results and xray results as well.

## 2014-11-21 NOTE — Progress Notes (Signed)
Subjective:    Patient ID: [name], male    DOB: 07/01/1955, 59 y.o.   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 He had some surgery when was very young to correct imperforate anus. Since then he had intermittent episodes of difficulty with bm. But in 2013 got constipated then obstructed. Then had to have surgery to relieve the obstruction.  Pt states last time he had a bm was likely Sunday night. He states bm was on small side.Pt has been trying dulcolax. Pt states recent constipation over lat 7-10 days on and off.  No fevers, no chills, no nausea or vomiting. Pt feels mild  bloated. He is not passing a lot of gas. Pt states not having abdomen or back pain  Pt in 2013 did have obstruction. That time he had severe pain.  Dr. Juanda ChanceBrodie did coloscopy in 2008.   Review of Systems  Constitutional: Negative for fever, chills and fatigue.  Respiratory: Negative for cough, chest tightness, shortness of breath and wheezing.   Cardiovascular: Negative for chest pain and palpitations.  Gastrointestinal: Positive for constipation. Negative for nausea, vomiting, abdominal pain, diarrhea, blood in stool and abdominal distention.  Genitourinary: Negative.   Musculoskeletal: Negative for back pain.  Neurological: Negative for dizziness and headaches.  Hematological: Negative for adenopathy. Does not bruise/bleed easily.  Psychiatric/Behavioral: Negative for confusion and agitation.    Past Medical History  Diagnosis Date  . H/O: GI bleed   . Idiopathic scoliosis   . Colonic inertia     with chronic lifelong costipation  . Congenital fusion of cervical spine   . 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 Anxiety and depression 03/14/2010  . FECAL INCONTINENCE 08/19/2007  . Hydrocephalus --per CT 04/2014 07/23/2014    History   Social History  . Marital Status: Single    Spouse Name: N/A  . Number of Children: 0  . Years of Education: N/A   Occupational  History  . 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 Smoking status: Never Smoker   . Smokeless tobacco: Never Used  . Alcohol Use: No     Comment: socially   . Drug Use: No  . Sexual Activity: No   Other Topics Concern  . Not on file   Social History Narrative   Lives w/ mother , has a younger brother and sister     Past Surgical History  Procedure Laterality Date  . Left knee surgery  1994, 1992  . Hernia repair  1980    left inguinal hernia  . Surgery for imperforate anus  1958  . Upper gastrointestinal endoscopy  05/15/03  . Colonoscopy  11/08/2006  . Laparotomy  07/18/2011    Procedure: EXPLORATORY LAPAROTOMY;  Surgeon: Mariella SaaBenjamin T Hoxworth, MD;  Location: [facility];  Service: General;  Laterality: N/A;  lysis of adhesions entero enterostomy  . Laparotomy  07/28/2011    Procedure: EXPLORATORY LAPAROTOMY;  Surgeon: Mariella SaaBenjamin T Hoxworth, MD;  Location: [facility];  Service: General;  Laterality: N/A;    Family History  Problem Relation Age of Onset  . Alzheimer's disease Father   . Diabetes Mother   . Colon cancer Neg Hx   . Prostate cancer Neg Hx     No Known Allergies  Current Outpatient Prescriptions on File Prior to Visit  Medication Sig Dispense Refill  . ALPRAZolam (XANAX) 1 MG tablet Take 1 tablet (  1 mg total) by mouth every morning. 30 tablet 0  . meclizine (ANTIVERT) 12.5 MG tablet Take 1 tablet (12.5 mg total) by mouth 3 (three) times daily as needed for dizziness. 21 tablet 0  . nortriptyline (PAMELOR) 50 MG capsule Take 50 mg by mouth 2 (two) times daily.     No current facility-administered medications on file prior to visit.    BP 117/86 mmHg  Pulse 94  Temp(Src) 98.1 F (36.7 C) (Oral)  Ht  (1.676 m)  Wt 122 lb (55.339 kg)  BMI 19.70 kg/m2  SpO2 100%       Objective:   Physical Exam   General Appearance- Not in acute distress.  HEENT Eyes- Scleraeral/Conjuntiva-bilat- Not Yellow. Mouth & Throat-  Normal.  Chest and Lung Exam Auscultation: Breath sounds:-Normal. CTA Adventitious sounds:- No Adventitious sounds.  Cardiovascular Auscultation:Rythm - Regular, rate and rythm. Heart Sounds -Normal heart sounds.  Abdomen Inspection:-Inspection Normal.  Palpation/Perucssion: Palpation and Percussion of the abdomen reveal- Non Tender, No Rebound tenderness, No rigidity(Guarding) and No Palpable abdominal masses.  Liver:-Normal.  Spleen:- Normal.   Rectal Anorectal Exam: Stool - Hemoccult of stool/mucous is Heme Negative. External - normal external exam. Internal - loose/weak  sphincter tone. No rectal mass.     Assessment & Plan:

## 2014-11-21 NOTE — Telephone Encounter (Signed)
No charge. 

## 2014-11-21 NOTE — Assessment & Plan Note (Signed)
With concern for possible early obstruction. Will get cbc and cmp stat now. Will send pt down for abdominal xray. Advise to stay stay down in radiology until I get stat results on xray. May need to get Ct of abdomen/pelvis. Presently did try to call Dr. 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 So awaiting her call and at present pt will go to lab and xray.

## 2014-11-22 ENCOUNTER — Telehealth: Payer: Self-pay | Admitting: Medical

## 2014-11-22 DIAGNOSIS — K5909 Other constipation: Secondary | ICD-10-CM

## 2014-11-22 NOTE — Telephone Encounter (Signed)
Repeat k after magnesium citrate treatment. Not pt did report small bm last night after spinach.

## 2014-11-28 ENCOUNTER — Encounter: Payer: Self-pay | Admitting: Medical

## 2014-11-28 ENCOUNTER — Ambulatory Visit (INDEPENDENT_AMBULATORY_CARE_PROVIDER_SITE_OTHER): Payer: Medicare Other | Admitting: Medical

## 2014-11-28 VITALS — BP 96/78 | HR 84 | Temp 98.2°F | Ht 66.0 in | Wt 123.0 lb

## 2014-11-28 DIAGNOSIS — K5909 Other constipation: Secondary | ICD-10-CM | POA: Diagnosis not present

## 2014-11-28 NOTE — Progress Notes (Signed)
Subjective:    Patient ID: [name], male    DOB: 08/31/1955, 59 y.o.   MRN: [id number]  HPI  Pt in for follow up. He states he feeling much better. Pt states last bm this am. He has had daily bm since I last saw him. Pt had constipation. Also he is in for recheck of his k post mag citrate. He used mag citrate 1 bottle for 3 days in a row as advised.  Pt wants to know what can stimulate his bowel to move bm quicker on daily basis.  Pt states tried coconot milk in the past. Pt drinks 4 cups of coffee a day.       Review of Systems  Constitutional: Negative for fever, chills and fatigue.  Respiratory: Negative for cough, chest tightness and wheezing.   Cardiovascular: Negative for chest pain and palpitations.  Gastrointestinal: Negative for abdominal pain, diarrhea, constipation, blood in stool and abdominal distention.  Musculoskeletal: Negative for back pain.  Skin: Negative for rash.  Neurological: Negative for dizziness and headaches.  Hematological: Negative for adenopathy. Does not bruise/bleed easily.  Psychiatric/Behavioral: Negative for behavioral problems and agitation.     Past Medical History  Diagnosis Date  . H/O: GI bleed   . Idiopathic scoliosis   . Colonic inertia     with chronic lifelong costipation  . Congenital fusion of cervical spine   . 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 Anxiety and depression 03/14/2010  . FECAL INCONTINENCE 08/19/2007  . Hydrocephalus --per CT 04/2014 07/23/2014    History   Social History  . Marital Status: Single    Spouse Name: N/A  . Number of Children: 0  . Years of Education: N/A   Occupational History  . 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 Smoking status: Never Smoker   . Smokeless tobacco: Never Used  . Alcohol Use: No     Comment: socially   . Drug Use: No  . Sexual Activity: No   Other Topics Concern  . Not on file   Social History Narrative   Lives w/  mother , has a younger brother and sister     Past Surgical History  Procedure Laterality Date  . Left knee surgery  1994, 1992  . Hernia repair  1980    left inguinal hernia  . Surgery for imperforate anus  1958  . Upper gastrointestinal endoscopy  05/15/03  . Colonoscopy  11/08/2006  . Laparotomy  07/18/2011    Procedure: EXPLORATORY LAPAROTOMY;  Surgeon: Mariella SaaBenjamin T Hoxworth, MD;  Location: [facility];  Service: General;  Laterality: N/A;  lysis of adhesions entero enterostomy  . Laparotomy  07/28/2011    Procedure: EXPLORATORY LAPAROTOMY;  Surgeon: Mariella SaaBenjamin T Hoxworth, MD;  Location: [facility];  Service: General;  Laterality: N/A;    Family History  Problem Relation Age of Onset  . Alzheimer's disease Father   . Diabetes Mother   . Colon cancer Neg Hx   . Prostate cancer Neg Hx     No Known Allergies  Current Outpatient Prescriptions on File Prior to Visit  Medication Sig Dispense Refill  . ALPRAZolam (XANAX) 1 MG tablet Take 1 tablet (1 mg total) by mouth every morning. 30 tablet 0  . meclizine (ANTIVERT) 12.5 MG tablet Take 1 tablet (12.5 mg total) by mouth 3 (three) times daily as needed for dizziness. 21 tablet 0  . nortriptyline (PAMELOR) 50 MG capsule Take 50  mg by mouth 2 (two) times daily.     No current facility-administered medications on file prior to visit.    BP 96/78 mmHg  Pulse 84  Temp(Src) 98.2 F (36.8 C) (Oral)  Ht  (1.676 m)  Wt 123 lb (55.792 kg)  BMI 19.86 kg/m2  SpO2 98%       Objective:   Physical Exam  General Appearance- Not in acute distress.  HEENT Eyes- Scleraeral/Conjuntiva-bilat- Not Yellow. Mouth & Throat- Normal.  Chest and Lung Exam Auscultation: Breath sounds:-Normal. Adventitious sounds:- No Adventitious sounds.  Cardiovascular Auscultation:Rythm - Regular. Heart Sounds -Normal heart sounds.  Abdomen Inspection:-Inspection Normal.  Palpation/Perucssion: Palpation and Percussion of the abdomen reveal- Non Tender, No  Rebound tenderness, No rigidity(Guarding) and No Palpable abdominal masses.  Liver:-Normal.  Spleen:- Normal.   Back- no cva.      Assessment & Plan:

## 2014-11-28 NOTE — Progress Notes (Signed)
Pre visit review using our clinic review tool, if applicable. No additional management support is needed unless otherwise documented below in the visit note. 

## 2014-11-28 NOTE — Patient Instructions (Signed)
Constipation Constipation is now resolved. Hx of colonic inertia, imprerforate anus surgery. (and obstruction).  Will advise hydrate well and exercise. Use mag citrate as back up if needed on occasion.   Pt declines referral to Gi for more extensive advise on how to increase Gi motility in light of his hx.      Follow up as regularly scheduled with pcp or as needed.  Get cmp today.

## 2014-11-28 NOTE — Assessment & Plan Note (Signed)
Constipation is now resolved. Hx of colonic inertia, imprerforate anus surgery. (and obstruction).  Will advise hydrate well and exercise. Use mag citrate as back up if needed on occasion.   Pt declines referral to Gi for more extensive advise on how to increase Gi motility in light of his hx.

## 2014-11-29 LAB — COMPREHENSIVE METABOLIC PANEL
ALK PHOS: 48 U/L (ref 39–117)
ALT: 10 U/L (ref 0–53)
AST: 16 U/L (ref 0–37)
Albumin: 4.2 g/dL (ref 3.5–5.2)
BUN: 9 mg/dL (ref 6–23)
CALCIUM: 9.6 mg/dL (ref 8.4–10.5)
CO2: 30 mEq/L (ref 19–32)
CREATININE: 0.93 mg/dL (ref 0.40–1.50)
Chloride: 103 mEq/L (ref 96–112)
GFR: 88.72 mL/min (ref >60.00)
Glucose, Bld: 71 mg/dL (ref 70–99)
Potassium: 3.8 mEq/L (ref 3.5–5.1)
Sodium: 140 mEq/L (ref 135–145)
Total Bilirubin: 0.4 mg/dL (ref 0.2–1.2)
Total Protein: 6.6 g/dL (ref 6.0–8.3)

## 2014-12-11 ENCOUNTER — Encounter (HOSPITAL_COMMUNITY): Payer: Self-pay | Admitting: Emergency Medicine

## 2014-12-11 ENCOUNTER — Emergency Department (HOSPITAL_COMMUNITY): Payer: Medicare Other

## 2014-12-11 ENCOUNTER — Emergency Department (HOSPITAL_COMMUNITY)
Admission: EM | Admit: 2014-12-11 | Discharge: 2014-12-11 | Disposition: A | Discharge status: Home / Self Care | Payer: Medicare Other | Attending: Emergency Medicine | Admitting: Emergency Medicine

## 2014-12-11 DIAGNOSIS — S82041A Displaced comminuted fracture of right patella, initial encounter for closed fracture: Secondary | ICD-10-CM | POA: Insufficient documentation

## 2014-12-11 DIAGNOSIS — F329 Major depressive disorder, single episode, unspecified: Secondary | ICD-10-CM | POA: Diagnosis not present

## 2014-12-11 DIAGNOSIS — Z8669 Personal history of other diseases of the nervous system and sense organs: Secondary | ICD-10-CM | POA: Insufficient documentation

## 2014-12-11 DIAGNOSIS — S8991XA Unspecified injury of right lower leg, initial encounter: Secondary | ICD-10-CM | POA: Diagnosis present

## 2014-12-11 DIAGNOSIS — Z8739 Personal history of other diseases of the musculoskeletal system and connective tissue: Secondary | ICD-10-CM | POA: Insufficient documentation

## 2014-12-11 DIAGNOSIS — Y9389 Activity, other specified: Secondary | ICD-10-CM | POA: Diagnosis not present

## 2014-12-11 DIAGNOSIS — F419 Anxiety disorder, unspecified: Secondary | ICD-10-CM | POA: Diagnosis not present

## 2014-12-11 DIAGNOSIS — M25561 Pain in right knee: Secondary | ICD-10-CM | POA: Diagnosis not present

## 2014-12-11 DIAGNOSIS — Z8719 Personal history of other diseases of the digestive system: Secondary | ICD-10-CM | POA: Diagnosis not present

## 2014-12-11 DIAGNOSIS — Y92410 Unspecified street and highway as the place of occurrence of the external cause: Secondary | ICD-10-CM | POA: Insufficient documentation

## 2014-12-11 DIAGNOSIS — S82001A Unspecified fracture of right patella, initial encounter for closed fracture: Secondary | ICD-10-CM

## 2014-12-11 DIAGNOSIS — Z01818 Encounter for other preprocedural examination: Secondary | ICD-10-CM | POA: Diagnosis not present

## 2014-12-11 DIAGNOSIS — Q428 Congenital absence, atresia and stenosis of other parts of large intestine: Secondary | ICD-10-CM | POA: Diagnosis not present

## 2014-12-11 DIAGNOSIS — Z79899 Other long term (current) drug therapy: Secondary | ICD-10-CM | POA: Insufficient documentation

## 2014-12-11 DIAGNOSIS — T148 Other injury of unspecified body region: Secondary | ICD-10-CM | POA: Diagnosis not present

## 2014-12-11 DIAGNOSIS — Q7649 Other congenital malformations of spine, not associated with scoliosis: Secondary | ICD-10-CM | POA: Diagnosis not present

## 2014-12-11 DIAGNOSIS — Z9889 Other specified postprocedural states: Secondary | ICD-10-CM | POA: Insufficient documentation

## 2014-12-11 DIAGNOSIS — Y999 Unspecified external cause status: Secondary | ICD-10-CM | POA: Insufficient documentation

## 2014-12-11 LAB — BASIC METABOLIC PANEL
ANION GAP: 6 (ref 5–15)
BUN: 12 mg/dL (ref 6–20)
CHLORIDE: 101 mmol/L (ref 101–111)
CO2: 29 mmol/L (ref 22–32)
Calcium: 9.1 mg/dL (ref 8.9–10.3)
Creatinine, Ser: 0.92 mg/dL (ref 0.61–1.24)
GFR calc Af Amer: >60 mL/min (ref >60)
GFR calc non Af Amer: >60 mL/min (ref >60)
Glucose, Bld: 110 mg/dL — ABNORMAL HIGH (ref 65–99)
Potassium: 3.5 mmol/L (ref 3.5–5.1)
Sodium: 136 mmol/L (ref 135–145)

## 2014-12-11 LAB — CBC WITH DIFFERENTIAL/PLATELET
BASOS PCT: 0 % (ref 0–1)
Basophils Absolute: 0 10*3/uL (ref 0.0–0.1)
EOS ABS: 0.1 10*3/uL (ref 0.0–0.7)
Eosinophils Relative: 1 % (ref 0–5)
HCT: 41.9 % (ref 39.0–52.0)
Hemoglobin: 13.8 g/dL (ref 13.0–17.0)
LYMPHS ABS: 0.7 10*3/uL (ref 0.7–4.0)
LYMPHS PCT: 7 % — AB (ref 12–46)
MCH: 28 pg (ref 26.0–34.0)
MCHC: 32.9 g/dL (ref 30.0–36.0)
MCV: 85.2 fL (ref 78.0–100.0)
MONO ABS: 0.4 10*3/uL (ref 0.1–1.0)
Monocytes Relative: 4 % (ref 3–12)
NEUTROS ABS: 8.4 10*3/uL — AB (ref 1.7–7.7)
Neutrophils Relative %: 88 % — ABNORMAL HIGH (ref 43–77)
Platelets: 215 10*3/uL (ref 150–400)
RBC: 4.92 MIL/uL (ref 4.22–5.81)
RDW: 13.6 % (ref 11.5–15.5)
WBC: 9.6 10*3/uL (ref 4.0–10.5)

## 2014-12-11 MED ORDER — OXYCODONE-ACETAMINOPHEN 5-325 MG PO TABS
1.0000 | ORAL_TABLET | Freq: Once | ORAL | Status: AC
  Administered 2014-12-11: 1 via ORAL
  Filled 2014-12-11: qty 1

## 2014-12-11 MED ORDER — OXYCODONE-ACETAMINOPHEN 5-325 MG PO TABS: ORAL_TABLET | ORAL | Status: DC

## 2014-12-11 NOTE — Discharge Instructions (Signed)
Take percocet for breakthrough pain, do not drink alcohol, drive, care for children or do other critical tasks while taking percocet. ° °Please follow with your primary care doctor in the next 2 days for a check-up. They must obtain records for further management.  ° °Do not hesitate to return to the Emergency Department for any new, worsening or concerning symptoms.  ° °
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
## 2014-12-11 NOTE — ED Notes (Signed)
Bed: WA01 Expected date:  Expected time:  Means of arrival:  Comments: 

## 2014-12-13 DIAGNOSIS — S82044A Nondisplaced comminuted fracture of right patella, initial encounter for closed fracture: Secondary | ICD-10-CM | POA: Diagnosis not present

## 2014-12-14 ENCOUNTER — Inpatient Hospital Stay (HOSPITAL_COMMUNITY): Payer: Medicare Other | Admitting: Anesthesiology

## 2014-12-14 ENCOUNTER — Inpatient Hospital Stay (HOSPITAL_COMMUNITY): Payer: Medicare Other

## 2014-12-14 ENCOUNTER — Encounter (HOSPITAL_COMMUNITY): Payer: Self-pay | Admitting: *Deleted

## 2014-12-14 ENCOUNTER — Observation Stay (HOSPITAL_COMMUNITY)
Admission: RE | Admit: 2014-12-14 | Discharge: 2014-12-16 | Disposition: A | Discharge status: Home / Self Care | Payer: Medicare Other | Source: Ambulatory Visit | Attending: Orthopedic Surgery | Admitting: Orthopedic Surgery

## 2014-12-14 ENCOUNTER — Encounter (HOSPITAL_COMMUNITY)
Admission: RE | Disposition: A | Discharge status: Home / Self Care | Payer: Self-pay | Source: Ambulatory Visit | Attending: Orthopedic Surgery

## 2014-12-14 DIAGNOSIS — S82001A Unspecified fracture of right patella, initial encounter for closed fracture: Secondary | ICD-10-CM | POA: Diagnosis not present

## 2014-12-14 DIAGNOSIS — Z79899 Other long term (current) drug therapy: Secondary | ICD-10-CM | POA: Insufficient documentation

## 2014-12-14 DIAGNOSIS — M4120 Other idiopathic scoliosis, site unspecified: Secondary | ICD-10-CM | POA: Insufficient documentation

## 2014-12-14 DIAGNOSIS — S82009A Unspecified fracture of unspecified patella, initial encounter for closed fracture: Secondary | ICD-10-CM | POA: Diagnosis present

## 2014-12-14 DIAGNOSIS — S82011A Displaced osteochondral fracture of right patella, initial encounter for closed fracture: Secondary | ICD-10-CM | POA: Diagnosis not present

## 2014-12-14 DIAGNOSIS — F329 Major depressive disorder, single episode, unspecified: Secondary | ICD-10-CM | POA: Diagnosis not present

## 2014-12-14 DIAGNOSIS — S82001B Unspecified fracture of right patella, initial encounter for open fracture type I or II: Secondary | ICD-10-CM | POA: Diagnosis not present

## 2014-12-14 DIAGNOSIS — Y92410 Unspecified street and highway as the place of occurrence of the external cause: Secondary | ICD-10-CM | POA: Diagnosis not present

## 2014-12-14 DIAGNOSIS — F419 Anxiety disorder, unspecified: Secondary | ICD-10-CM | POA: Diagnosis not present

## 2014-12-14 DIAGNOSIS — Z419 Encounter for procedure for purposes other than remedying health state, unspecified: Secondary | ICD-10-CM

## 2014-12-14 HISTORY — DX: Major depressive disorder, single episode, unspecified: F32.9

## 2014-12-14 HISTORY — DX: Depression, unspecified: F32.A

## 2014-12-14 HISTORY — DX: Anxiety disorder, unspecified: F41.9

## 2014-12-14 HISTORY — PX: ORIF PATELLA: SHX5033

## 2014-12-14 LAB — TYPE AND SCREEN
ABO/RH(D): O POS
ANTIBODY SCREEN: NEGATIVE

## 2014-12-14 LAB — ABO/RH: ABO/RH(D): O POS

## 2014-12-14 SURGERY — OPEN REDUCTION INTERNAL FIXATION (ORIF) PATELLA
Anesthesia: General | Site: Knee | Laterality: Right

## 2014-12-14 MED ORDER — ACETAMINOPHEN 500 MG PO TABS
1000.0000 mg | ORAL_TABLET | Freq: Four times a day (QID) | ORAL | Status: AC
  Administered 2014-12-14 – 2014-12-15 (×3): 1000 mg via ORAL
  Filled 2014-12-14 (×7): qty 2

## 2014-12-14 MED ORDER — ENOXAPARIN SODIUM 40 MG/0.4ML ~~LOC~~ SOLN
40.0000 mg | SUBCUTANEOUS | Status: DC
  Administered 2014-12-15 – 2014-12-16 (×2): 40 mg via SUBCUTANEOUS
  Filled 2014-12-14 (×3): qty 0.4

## 2014-12-14 MED ORDER — HYDROMORPHONE HCL 1 MG/ML IJ SOLN
0.2500 mg | INTRAMUSCULAR | Status: DC | PRN
  Administered 2014-12-14 (×4): 0.5 mg via INTRAVENOUS

## 2014-12-14 MED ORDER — FENTANYL CITRATE (PF) 100 MCG/2ML IJ SOLN: 25.0000 ug | INTRAMUSCULAR | Status: DC | PRN

## 2014-12-14 MED ORDER — CEFAZOLIN SODIUM-DEXTROSE 2-3 GM-% IV SOLR
INTRAVENOUS | Status: AC
  Filled 2014-12-14: qty 50

## 2014-12-14 MED ORDER — ONDANSETRON HCL 4 MG/2ML IJ SOLN
4.0000 mg | Freq: Four times a day (QID) | INTRAMUSCULAR | Status: DC | PRN
  Administered 2014-12-14: 4 mg via INTRAVENOUS
  Filled 2014-12-14: qty 2

## 2014-12-14 MED ORDER — ALPRAZOLAM 1 MG PO TABS: 2.0000 mg | ORAL_TABLET | Freq: Every morning | ORAL | Status: DC

## 2014-12-14 MED ORDER — CEFAZOLIN SODIUM-DEXTROSE 2-3 GM-% IV SOLR
2.0000 g | INTRAVENOUS | Status: AC
  Administered 2014-12-14: 2 g via INTRAVENOUS

## 2014-12-14 MED ORDER — PROPOFOL 10 MG/ML IV BOLUS
INTRAVENOUS | Status: AC
  Filled 2014-12-14: qty 20

## 2014-12-14 MED ORDER — ACETAMINOPHEN 650 MG RE SUPP: 650.0000 mg | Freq: Four times a day (QID) | RECTAL | Status: DC | PRN

## 2014-12-14 MED ORDER — METHOCARBAMOL 500 MG PO TABS
500.0000 mg | ORAL_TABLET | Freq: Four times a day (QID) | ORAL | Status: DC | PRN
  Administered 2014-12-15 – 2014-12-16 (×3): 500 mg via ORAL
  Filled 2014-12-14 (×3): qty 1

## 2014-12-14 MED ORDER — BOOST / RESOURCE BREEZE PO LIQD
1.0000 | Freq: Three times a day (TID) | ORAL | Status: DC
  Administered 2014-12-15 (×2): 1 via ORAL

## 2014-12-14 MED ORDER — ONDANSETRON HCL 4 MG/2ML IJ SOLN: 4.0000 mg | Freq: Once | INTRAMUSCULAR | Status: DC | PRN

## 2014-12-14 MED ORDER — CHLORHEXIDINE GLUCONATE 4 % EX LIQD: 60.0000 mL | Freq: Once | CUTANEOUS | Status: DC

## 2014-12-14 MED ORDER — ONDANSETRON HCL 4 MG PO TABS: 4.0000 mg | ORAL_TABLET | Freq: Four times a day (QID) | ORAL | Status: DC | PRN

## 2014-12-14 MED ORDER — ACETAMINOPHEN 325 MG PO TABS
650.0000 mg | ORAL_TABLET | Freq: Four times a day (QID) | ORAL | Status: DC | PRN
  Administered 2014-12-16: 650 mg via ORAL
  Filled 2014-12-14: qty 2

## 2014-12-14 MED ORDER — MORPHINE SULFATE 2 MG/ML IJ SOLN
1.0000 mg | INTRAMUSCULAR | Status: DC | PRN
  Administered 2014-12-14 – 2014-12-15 (×8): 1 mg via INTRAVENOUS
  Filled 2014-12-14 (×8): qty 1

## 2014-12-14 MED ORDER — LIDOCAINE HCL (CARDIAC) 20 MG/ML IV SOLN
INTRAVENOUS | Status: DC | PRN
  Administered 2014-12-14: 60 mg via INTRAVENOUS

## 2014-12-14 MED ORDER — HYDROMORPHONE HCL 1 MG/ML IJ SOLN
INTRAMUSCULAR | Status: AC
  Filled 2014-12-14: qty 1

## 2014-12-14 MED ORDER — TRAMADOL HCL 50 MG PO TABS
50.0000 mg | ORAL_TABLET | Freq: Four times a day (QID) | ORAL | Status: DC | PRN
Start: 2014-12-14 — End: 2014-12-16
  Administered 2014-12-15 (×2): 100 mg via ORAL
  Filled 2014-12-14 (×2): qty 2

## 2014-12-14 MED ORDER — MIDAZOLAM HCL 5 MG/5ML IJ SOLN
INTRAMUSCULAR | Status: DC | PRN
  Administered 2014-12-14: 2 mg via INTRAVENOUS

## 2014-12-14 MED ORDER — BUPIVACAINE HCL 0.25 % IJ SOLN
INTRAMUSCULAR | Status: DC | PRN
  Administered 2014-12-14: 30 mL

## 2014-12-14 MED ORDER — ALPRAZOLAM 1 MG PO TABS
2.0000 mg | ORAL_TABLET | Freq: Every morning | ORAL | Status: DC
  Administered 2014-12-16: 2 mg via ORAL
  Filled 2014-12-14 (×2): qty 2

## 2014-12-14 MED ORDER — BUPIVACAINE HCL (PF) 0.25 % IJ SOLN
INTRAMUSCULAR | Status: AC
  Filled 2014-12-14: qty 30

## 2014-12-14 MED ORDER — METHOCARBAMOL 500 MG PO TABS: 500.0000 mg | ORAL_TABLET | Freq: Four times a day (QID) | ORAL | Status: DC | PRN

## 2014-12-14 MED ORDER — 0.9 % SODIUM CHLORIDE (POUR BTL) OPTIME
TOPICAL | Status: DC | PRN
  Administered 2014-12-14: 1000 mL

## 2014-12-14 MED ORDER — MIDAZOLAM HCL 2 MG/2ML IJ SOLN
INTRAMUSCULAR | Status: AC
  Filled 2014-12-14: qty 2

## 2014-12-14 MED ORDER — LACTATED RINGERS IV SOLN
INTRAVENOUS | Status: DC
  Administered 2014-12-14: 16:00:00 via INTRAVENOUS
  Administered 2014-12-14: 1000 mL via INTRAVENOUS

## 2014-12-14 MED ORDER — METHOCARBAMOL 1000 MG/10ML IJ SOLN
500.0000 mg | Freq: Four times a day (QID) | INTRAVENOUS | Status: DC | PRN
  Administered 2014-12-14: 500 mg via INTRAVENOUS
  Filled 2014-12-14 (×2): qty 5

## 2014-12-14 MED ORDER — METOCLOPRAMIDE HCL 5 MG/ML IJ SOLN
5.0000 mg | Freq: Three times a day (TID) | INTRAMUSCULAR | Status: DC | PRN
  Administered 2014-12-14: 10 mg via INTRAVENOUS
  Filled 2014-12-14: qty 2

## 2014-12-14 MED ORDER — OXYCODONE HCL 5 MG PO TABS
5.0000 mg | ORAL_TABLET | ORAL | Status: DC | PRN
Start: 2014-12-14 — End: 2014-12-19

## 2014-12-14 MED ORDER — FENTANYL CITRATE (PF) 250 MCG/5ML IJ SOLN
INTRAMUSCULAR | Status: DC | PRN
  Administered 2014-12-14: 75 ug via INTRAVENOUS
  Administered 2014-12-14: 100 ug via INTRAVENOUS
  Administered 2014-12-14: 75 ug via INTRAVENOUS

## 2014-12-14 MED ORDER — ONDANSETRON HCL 4 MG/2ML IJ SOLN
INTRAMUSCULAR | Status: AC
  Filled 2014-12-14: qty 2

## 2014-12-14 MED ORDER — SODIUM CHLORIDE 0.9 % IV SOLN
INTRAVENOUS | Status: DC
  Administered 2014-12-14: 21:00:00 via INTRAVENOUS

## 2014-12-14 MED ORDER — METOCLOPRAMIDE HCL 10 MG PO TABS: 5.0000 mg | ORAL_TABLET | Freq: Three times a day (TID) | ORAL | Status: DC | PRN

## 2014-12-14 MED ORDER — FENTANYL CITRATE (PF) 250 MCG/5ML IJ SOLN
INTRAMUSCULAR | Status: AC
  Filled 2014-12-14: qty 5

## 2014-12-14 MED ORDER — LIDOCAINE HCL (CARDIAC) 20 MG/ML IV SOLN
INTRAVENOUS | Status: AC
  Filled 2014-12-14: qty 5

## 2014-12-14 MED ORDER — ONDANSETRON HCL 4 MG/2ML IJ SOLN
INTRAMUSCULAR | Status: DC | PRN
  Administered 2014-12-14: 4 mg via INTRAVENOUS

## 2014-12-14 MED ORDER — CEFAZOLIN SODIUM-DEXTROSE 2-3 GM-% IV SOLR
2.0000 g | Freq: Four times a day (QID) | INTRAVENOUS | Status: AC
Start: 2014-12-14 — End: 2014-12-15
  Administered 2014-12-14 – 2014-12-15 (×3): 2 g via INTRAVENOUS
  Filled 2014-12-14 (×3): qty 50

## 2014-12-14 MED ORDER — OXYCODONE HCL 5 MG PO TABS
5.0000 mg | ORAL_TABLET | ORAL | Status: DC | PRN
  Administered 2014-12-14: 5 mg via ORAL
  Administered 2014-12-14 – 2014-12-16 (×9): 10 mg via ORAL
  Filled 2014-12-14 (×9): qty 2
  Filled 2014-12-14: qty 1

## 2014-12-14 MED ORDER — PROPOFOL 10 MG/ML IV BOLUS
INTRAVENOUS | Status: DC | PRN
  Administered 2014-12-14: 150 mg via INTRAVENOUS

## 2014-12-14 SURGICAL SUPPLY — 50 items
BAG ZIPLOCK 12X15 (MISCELLANEOUS) ×3 IMPLANT
BANDAGE ELASTIC 6 VELCRO ST LF (GAUZE/BANDAGES/DRESSINGS) ×3 IMPLANT
BANDAGE ESMARK 6X9 LF (GAUZE/BANDAGES/DRESSINGS) ×1 IMPLANT
BNDG COHESIVE 6X5 TAN STRL LF (GAUZE/BANDAGES/DRESSINGS) ×3 IMPLANT
BNDG ESMARK 6X9 LF (GAUZE/BANDAGES/DRESSINGS) ×3
BNDG GAUZE ELAST 4 BULKY (GAUZE/BANDAGES/DRESSINGS) IMPLANT
CLOSURE WOUND 1/2 X4 (GAUZE/BANDAGES/DRESSINGS)
DECANTER SPIKE VIAL GLASS SM (MISCELLANEOUS) IMPLANT
DRAPE EXTREMITY T 121X128X90 (DRAPE) ×3 IMPLANT
DRSG EMULSION OIL 3X16 NADH (GAUZE/BANDAGES/DRESSINGS) ×3 IMPLANT
DRSG EMULSION OIL 3X3 NADH (GAUZE/BANDAGES/DRESSINGS) ×3 IMPLANT
DRSG PAD ABDOMINAL 8X10 ST (GAUZE/BANDAGES/DRESSINGS) ×3 IMPLANT
DURAPREP 26ML APPLICATOR (WOUND CARE) ×3 IMPLANT
ELECT REM PT RETURN 9FT ADLT (ELECTROSURGICAL) ×3
ELECTRODE REM PT RTRN 9FT ADLT (ELECTROSURGICAL) ×1 IMPLANT
GAUZE SPONGE 4X4 12PLY STRL (GAUZE/BANDAGES/DRESSINGS) ×3 IMPLANT
GLOVE BIO SURGEON STRL SZ7.5 (GLOVE) IMPLANT
GLOVE BIO SURGEON STRL SZ8 (GLOVE) ×6 IMPLANT
GLOVE BIOGEL PI IND STRL 8 (GLOVE) ×1 IMPLANT
GLOVE BIOGEL PI INDICATOR 8 (GLOVE) ×2
GOWN STRL REUS W/TWL LRG LVL3 (GOWN DISPOSABLE) ×3 IMPLANT
GOWN STRL REUS W/TWL XL LVL3 (GOWN DISPOSABLE) ×3 IMPLANT
IMMOBILIZER KNEE 20 (SOFTGOODS) ×3 IMPLANT
K-WIRE STRYKER (Wire) ×6 IMPLANT
KIT BASIN OR (CUSTOM PROCEDURE TRAY) ×3 IMPLANT
KWIRE STRYKER (Wire) ×2 IMPLANT
MANIFOLD NEPTUNE II (INSTRUMENTS) ×3 IMPLANT
NEEDLE HYPO 22GX1.5 SAFETY (NEEDLE) ×3 IMPLANT
PACK TOTAL JOINT (CUSTOM PROCEDURE TRAY) ×3 IMPLANT
PAD ABD 8X10 STRL (GAUZE/BANDAGES/DRESSINGS) ×3 IMPLANT
PADDING CAST ABS 6INX4YD NS (CAST SUPPLIES) ×2
PADDING CAST ABS COTTON 6X4 NS (CAST SUPPLIES) ×1 IMPLANT
PADDING CAST COTTON 6X4 STRL (CAST SUPPLIES) ×6 IMPLANT
PASSER SUT SWANSON 36MM LOOP (INSTRUMENTS) ×3 IMPLANT
POSITIONER SURGICAL ARM (MISCELLANEOUS) ×3 IMPLANT
STAPLER VISISTAT 35W (STAPLE) ×3 IMPLANT
STRIP CLOSURE SKIN 1/2X4 (GAUZE/BANDAGES/DRESSINGS) IMPLANT
SUT ETHIBOND NAB CT1 #1 30IN (SUTURE) ×3 IMPLANT
SUT MNCRL AB 4-0 PS2 18 (SUTURE) ×3 IMPLANT
SUT VIC AB 0 CT1 27 (SUTURE)
SUT VIC AB 0 CT1 27XBRD ANTBC (SUTURE) IMPLANT
SUT VIC AB 1 CT1 27 (SUTURE) ×2
SUT VIC AB 1 CT1 27XBRD ANTBC (SUTURE) ×1 IMPLANT
SUT VIC AB 2-0 CT1 27 (SUTURE) ×4
SUT VIC AB 2-0 CT1 TAPERPNT 27 (SUTURE) ×2 IMPLANT
SUT WIRE 18GA (Wire) ×3 IMPLANT
SYRINGE 20CC LL (MISCELLANEOUS) ×3 IMPLANT
TOWEL OR 17X26 10 PK STRL BLUE (TOWEL DISPOSABLE) ×6 IMPLANT
WATER STERILE IRR 1500ML POUR (IV SOLUTION) ×3 IMPLANT
WRAP KNEE MAXI GEL POST OP (GAUZE/BANDAGES/DRESSINGS) ×3 IMPLANT

## 2014-12-14 NOTE — Interval H&P Note (Signed)
History and Physical Interval Note:  12/14/2014 3:57 PM  [name]  has presented today for surgery, with the diagnosis of right patella fracture  The various methods of treatment have been discussed with the patient and family. After consideration of risks, benefits and other options for treatment, the patient has consented to  Procedure(s): OPEN REDUCTION INTERNAL (ORIF) FIXATION PATELLA (Right) as a surgical intervention .  The patient's history has been reviewed, patient examined, no change in status, stable for surgery.  I have reviewed the patient's chart and labs.  Questions were answered to the patient's satisfaction.     Loanne Drilling
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
## 2014-12-14 NOTE — Anesthesia Procedure Notes (Signed)
Procedure Name: LMA Insertion Date/Time: 12/14/2014 4:11 PM Performed by: [place], Gary Bultman C Pre-anesthesia Checklist: Patient identified, Timeout performed, Emergency Drugs available, Suction available and Patient being monitored Patient Re-evaluated:Patient Re-evaluated prior to inductionOxygen Delivery Method: Circle system utilized Preoxygenation: Pre-oxygenation with 100% oxygen Intubation Type: IV induction LMA: LMA inserted LMA Size: 4.0 Number of attempts: 1 Placement Confirmation: positive ETCO2 and breath sounds checked- equal and bilateral Tube secured with: Tape Dental Injury: Teeth and Oropharynx as per pre-operative assessment

## 2014-12-14 NOTE — Discharge Instructions (Signed)
Wear your knee immobilizer at all times. YOU ARE NOT TO BEND THE KNEE AT ALL!!!  You may remove your large bandage in 2 days and begin showering in 3 days. Place a clean dry gauze bandage on your incision when done showering  Take an 81 mg Aspirin daily for the next 4 weeks
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
## 2014-12-14 NOTE — Transfer of Care (Signed)
Immediate Anesthesia Transfer of Care Note  Patient: [name]  Procedure(s) Performed: Procedure(s): OPEN REDUCTION INTERNAL (ORIF) FIXATION PATELLA (Right)  Patient Location: PACU  Anesthesia Type:General  Level of Consciousness: awake, alert  and oriented  Airway & Oxygen Therapy: Patient Spontanous Breathing and Patient connected to face mask oxygen  Post-op Assessment: Report given to RN and Post -op Vital signs reviewed and stable  Post vital signs: Reviewed and stable  Last Vitals:  Filed Vitals:   12/14/14 1258  BP: 96/65  Pulse: 70  Temp: 36.7 C  Resp: 16    Complications: No apparent anesthesia complications

## 2014-12-14 NOTE — Brief Op Note (Signed)
12/14/2014  5:19 PM  PATIENT:  [name]  59 y.o. male  PRE-OPERATIVE DIAGNOSIS:  right patella fracture  POST-OPERATIVE DIAGNOSIS:  right patella fracture  PROCEDURE:  Procedure(s): OPEN REDUCTION INTERNAL (ORIF) FIXATION PATELLA (Right)  SURGEON:  Surgeon(s) and Role:    * Ollen Gross, MD - Primary  PHYSICIAN ASSISTANT:   ASSISTANTS: none   ANESTHESIA:   general  EBL:     BLOOD ADMINISTERED:none  DRAINS: none   LOCAL MEDICATIONS USED:  MARCAINE     COUNTS:  YES  TOURNIQUET:   Total Tourniquet Time Documented: Thigh (Right) - 27 minutes Total: Thigh (Right) - 27 minutes   DICTATION: .Other Dictation: Dictation Number [id number]  PLAN OF CARE: Admit for overnight observation  PATIENT DISPOSITION:  PACU - hemodynamically stable.

## 2014-12-14 NOTE — Anesthesia Postprocedure Evaluation (Signed)
  Anesthesia Post-op Note  Patient: [name]  Procedure(s) Performed: Procedure(s) (LRB): OPEN REDUCTION INTERNAL (ORIF) FIXATION PATELLA (Right)  Patient Location: PACU  Anesthesia Type: General  Level of Consciousness: awake and alert   Airway and Oxygen Therapy: Patient Spontanous Breathing  Post-op Pain: mild  Post-op Assessment: Post-op Vital signs reviewed, Patient's Cardiovascular Status Stable, Respiratory Function Stable, Patent Airway and No signs of Nausea or vomiting  Last Vitals:  Filed Vitals:   12/14/14 1757  BP: 125/75  Pulse: 98  Temp:   Resp: 16    Post-op Vital Signs: stable   Complications: No apparent anesthesia complications

## 2014-12-15 DIAGNOSIS — F419 Anxiety disorder, unspecified: Secondary | ICD-10-CM | POA: Diagnosis not present

## 2014-12-15 DIAGNOSIS — F329 Major depressive disorder, single episode, unspecified: Secondary | ICD-10-CM | POA: Diagnosis not present

## 2014-12-15 DIAGNOSIS — M4120 Other idiopathic scoliosis, site unspecified: Secondary | ICD-10-CM | POA: Diagnosis not present

## 2014-12-15 DIAGNOSIS — S82001A Unspecified fracture of right patella, initial encounter for closed fracture: Secondary | ICD-10-CM | POA: Diagnosis not present

## 2014-12-15 DIAGNOSIS — Z79899 Other long term (current) drug therapy: Secondary | ICD-10-CM | POA: Diagnosis not present

## 2014-12-15 NOTE — Evaluation (Signed)
Physical Therapy Evaluation Patient Details Name: [name] MRN: [id number] DOB: [birth year] Today's Date: 12/15/2014   History of Present Illness  ORIF R Patella 12/14/14  Clinical Impression  Patient ambulated well withRW, will practice with crutches. Patient will benefit from PT to address problems listed in note below.    Follow Up Recommendations Home health PT;Supervision - Intermittent    Equipment Recommendations  None recommended by PT    Recommendations for Other Services       Precautions / Restrictions Precautions Required Braces or Orthoses: Knee Immobilizer - Right Knee Immobilizer - Right: On at all times Restrictions Weight Bearing Restrictions: No      Mobility  Bed Mobility Overal bed mobility: Modified Independent                Transfers Overall transfer level: Needs assistance Equipment used: Rolling walker (2 wheeled) Transfers: Sit to/from Stand Sit to Stand: Supervision         General transfer comment: cues for safety  Ambulation/Gait Ambulation/Gait assistance: Supervision Ambulation Distance (Feet): 50 Feet Assistive device: Rolling walker (2 wheeled) Gait Pattern/deviations: Step-to pattern;Antalgic     General Gait Details: cues for safety  Stairs            Wheelchair Mobility    Modified Rankin (Stroke Patients Only)       Balance                                             Pertinent Vitals/Pain Pain Assessment: 0-10 Pain Score: 8  Pain Location: R knee Pain Descriptors / Indicators: Aching Pain Intervention(s): Limited activity within patient's tolerance;Monitored during session;Premedicated before session;Ice applied;Repositioned    Home Living Family/patient expects to be discharged to:: Private residence   Available Help at Discharge: Family Type of Home: House Home Access: Stairs to enter Entrance Stairs-Rails: None Secretary/administrator of Steps: 2 Home Layout: One  level Home Equipment: Environmental consultant - 2 wheels;Crutches      Prior Function Level of Independence: Independent               Hand Dominance        Extremity/Trunk Assessment   Upper Extremity Assessment: Overall WFL for tasks assessed           Lower Extremity Assessment: RLE deficits/detail RLE Deficits / Details: in KI    Cervical / Trunk Assessment: Kyphotic  Communication   Communication: No difficulties  Cognition Arousal/Alertness: Awake/alert Behavior During Therapy: WFL for tasks assessed/performed Overall Cognitive Status: Within Functional Limits for tasks assessed                      General Comments      Exercises        Assessment/Plan    PT Assessment Patient needs continued PT services  PT Diagnosis Difficulty walking;Acute pain   PT Problem List Decreased strength;Decreased range of motion;Decreased activity tolerance;Decreased mobility;Decreased knowledge of use of DME;Decreased safety awareness;Decreased knowledge of precautions;Pain  PT Treatment Interventions DME instruction;Gait training;Stair training;Functional mobility training;Therapeutic activities;Therapeutic exercise;Patient/family education   PT Goals (Current goals can be found in the Care Plan section) Acute Rehab PT Goals Patient Stated Goal: to go home PT Goal Formulation: With patient Time For Goal Achievement: 12/18/14 Potential to Achieve Goals: Good    Frequency 7X/week   Barriers to discharge  Co-evaluation               End of Session Equipment Utilized During Treatment: Right knee immobilizer Activity Tolerance: Patient tolerated treatment well Patient left: in chair;with call bell/phone within reach Nurse Communication: Mobility status    Functional Assessment Tool Used: clinical judgement Functional Limitation: Mobility: Walking and moving around Mobility: Walking and Moving Around Current Status (Z6109): At least 1 percent but less  than 20 percent impaired, limited or restricted Mobility: Walking and Moving Around Goal Status [phone]): 0 percent impaired, limited or restricted    Time: 0845-0900 PT Time Calculation (min) (ACUTE ONLY): 15 min   Charges:   PT Evaluation $Initial PT Evaluation Tier I: 1 Procedure     PT G Codes:   PT G-Codes **NOT FOR INPATIENT CLASS** Functional Assessment Tool Used: clinical judgement Functional Limitation: Mobility: Walking and moving around Mobility: Walking and Moving Around Current Status (U9811): At least 1 percent but less than 20 percent impaired, limited or restricted Mobility: Walking and Moving Around Goal Status [phone]): 0 percent impaired, limited or restricted    Rada Hay 12/15/2014, 10:39 AM Blanchard Kelch PT [phone]

## 2014-12-15 NOTE — Progress Notes (Signed)
Subjective: 1 Day Post-Op Procedure(s) (LRB): OPEN REDUCTION INTERNAL (ORIF) FIXATION PATELLA (Right) Patient reports pain as 2 on 0-10 scale.    Objective: Vital signs in last 24 hours: Temp:  [98.1 F (36.7 C)-99.4 F (37.4 C)] 98.9 F (37.2 C) (07/23 0600) Pulse Rate:  [70-108] 74 (07/23 0600) Resp:  [10-22] 16 (07/23 0600) BP: (96-143)/(65-97) 125/74 mmHg (07/23 0600) SpO2:  [94 %-100 %] 97 % (07/23 0600) Weight:  [54.999 kg (121 lb 4 oz)] 54.999 kg (121 lb 4 oz) (07/22 1258)  Intake/Output from previous day: 07/22 0701 - 07/23 0700 In: 980 [P.O.:120; I.V.:860] Out: 1250 [Urine:1250] Intake/Output this shift: Total I/O In: 240 [P.O.:240] Out: -   No results for input(s): HGB in the last 72 hours. No results for input(s): WBC, RBC, HCT, PLT in the last 72 hours. No results for input(s): NA, K, CL, CO2, BUN, CREATININE, GLUCOSE, CALCIUM in the last 72 hours. No results for input(s): LABPT, INR in the last 72 hours.  Neurologically intact  Assessment/Plan: 1 Day Post-Op Procedure(s) (LRB): OPEN REDUCTION INTERNAL (ORIF) FIXATION PATELLA (Right) Up with therapy discontinued tomorrow.  Samie Reasons A 12/15/2014, 9:29 AM

## 2014-12-15 NOTE — Progress Notes (Signed)
Subjective: 1 Day Post-Op Procedure(s) (LRB): OPEN REDUCTION INTERNAL (ORIF) FIXATION PATELLA (Right) Patient reports pain as 2 on 0-10 scale.  Doing very well. Should be ready for  DC Sunday.  Objective: Vital signs in last 24 hours: Temp:  [98.1 F (36.7 C)-99.4 F (37.4 C)] 98.9 F (37.2 C) (07/23 0600) Pulse Rate:  [70-108] 74 (07/23 0600) Resp:  [10-22] 16 (07/23 0600) BP: (96-143)/(65-97) 125/74 mmHg (07/23 0600) SpO2:  [94 %-100 %] 97 % (07/23 0600) Weight:  [54.999 kg (121 lb 4 oz)] 54.999 kg (121 lb 4 oz) (07/22 1258)  Intake/Output from previous day: 07/22 0701 - 07/23 0700 In: 980 [P.O.:120; I.V.:860] Out: 1250 [Urine:1250] Intake/Output this shift: Total I/O In: 240 [P.O.:240] Out: -   No results for input(s): HGB in the last 72 hours. No results for input(s): WBC, RBC, HCT, PLT in the last 72 hours. No results for input(s): NA, K, CL, CO2, BUN, CREATININE, GLUCOSE, CALCIUM in the last 72 hours. No results for input(s): LABPT, INR in the last 72 hours.  Neurologically intact  Assessment/Plan: 1 Day Post-Op Procedure(s) (LRB): OPEN REDUCTION INTERNAL (ORIF) FIXATION PATELLA (Right) Up with therapy DC Sunday.  Jonathon Tan A 12/15/2014, 9:20 AM

## 2014-12-16 DIAGNOSIS — M4120 Other idiopathic scoliosis, site unspecified: Secondary | ICD-10-CM | POA: Diagnosis not present

## 2014-12-16 DIAGNOSIS — F419 Anxiety disorder, unspecified: Secondary | ICD-10-CM | POA: Diagnosis not present

## 2014-12-16 DIAGNOSIS — Z79899 Other long term (current) drug therapy: Secondary | ICD-10-CM | POA: Diagnosis not present

## 2014-12-16 DIAGNOSIS — S82001A Unspecified fracture of right patella, initial encounter for closed fracture: Secondary | ICD-10-CM | POA: Diagnosis not present

## 2014-12-16 DIAGNOSIS — F329 Major depressive disorder, single episode, unspecified: Secondary | ICD-10-CM | POA: Diagnosis not present

## 2014-12-16 MED ORDER — ACETAMINOPHEN 325 MG PO TABS: 650.0000 mg | ORAL_TABLET | Freq: Four times a day (QID) | ORAL | Status: DC | PRN

## 2014-12-16 MED ORDER — ASPIRIN EC 325 MG PO TBEC: 325.0000 mg | DELAYED_RELEASE_TABLET | Freq: Every day | ORAL | Status: AC

## 2014-12-16 NOTE — Progress Notes (Signed)
Physical Therapy Treatment Patient Details Name: [name] MRN: [id number] DOB: 06/20/1955 Today's Date: 12/16/2014    History of Present Illness ORIF R Patella 12/14/14    PT Comments    Reviewed and provided written instructions for placement of KI, not Knee flexion on R, elevate foot, WBAT. Patient appreciative of the info. Is going to stay with his sister who has not steps. Provided leg lifter.  Follow Up Recommendations  No PT follow up     Equipment Recommendations  None recommended by PT;Standard walker;Rolling walker with 5" wheels    Recommendations for Other Services       Precautions / Restrictions Precautions Required Braces or Orthoses: Knee Immobilizer - Right Knee Immobilizer - Right: On at all times Restrictions Weight Bearing Restrictions: No    Mobility  Bed Mobility                  Transfers     Transfers: Sit to/from Stand Sit to Stand: Modified independent (Device/Increase time)            Ambulation/Gait Ambulation/Gait assistance: Supervision Ambulation Distance (Feet): 400 Feet Assistive device: Rolling walker (2 wheeled) Gait Pattern/deviations: Step-to pattern;Step-through pattern;Antalgic         Stairs            Wheelchair Mobility    Modified Rankin (Stroke Patients Only)       Balance                                    Cognition Arousal/Alertness: Awake/alert Behavior During Therapy: WFL for tasks assessed/performed Overall Cognitive Status: Within Functional Limits for tasks assessed                      Exercises      General Comments        Pertinent Vitals/Pain Pain Assessment: 0-10 Pain Score: 8  Pain Location: R knee Pain Descriptors / Indicators: Aching;Stabbing Pain Intervention(s): Monitored during session;Patient requesting pain meds-RN notified;Ice applied    Home Living                      Prior Function            PT Goals (current  goals can now be found in the care plan section) Progress towards PT goals: Progressing toward goals    Frequency       PT Plan Current plan remains appropriate    Co-evaluation             End of Session Equipment Utilized During Treatment: Right knee immobilizer Activity Tolerance: Patient tolerated treatment well Patient left: in chair;with call bell/phone within reach     Time: 1004-1040 PT Time Calculation (min) (ACUTE ONLY): 36 min  Charges:  $Gait Training: 8-22 mins $Self Care/Home Management: 8-22                    G Codes:  Functional Limitation: Mobility: Walking and moving around Mobility: Walking and Moving Around Current Status [phone]): At least 1 percent but less than 20 percent impaired, limited or restricted Mobility: Walking and Moving Around Goal Status [phone]): 0 percent impaired, limited or restricted Mobility: Walking and Moving Around Discharge Status [phone]): At least 1 percent but less than 20 percent impaired, limited or restricted   Rada Hay 12/16/2014, 11:02 AM Blanchard Kelch PT [phone]

## 2014-12-16 NOTE — Care Management Note (Signed)
Case Management Note  Patient Details  Name: SARGON SCOUTEN MRN: [id number] Date of Birth: 12/21/1955  Subjective/Objective:     ORIF R Patella 12/14/14               Action/Plan: Home  Expected Discharge Date:  12/16/2014               Expected Discharge Plan:  Home/Self Care  In-House Referral:     Discharge planning Services  CM Consult  Post Acute Care Choice:    Choice offered to:     DME Arranged:  3-N-1, Walker rolling DME Agency:  [facility]  HH Arranged:    [facility] Agency:     Status of Service:  Completed, signed off  Medicare Important Message Given:    Date Medicare IM Given:    Medicare IM give by:    Date Additional Medicare IM Given:    Additional Medicare Important Message give by:     If discussed at Long Length of Stay Meetings, dates discussed:    Additional Comments: Consulted for DME and HH. HH PT recommended. Spoke to Ortho PA and [facility] PT not needed at this time for home. NCM spoke to pt and made aware that once evaluated at follow up that Ortho MD will access his need for outpt PT or HHPT. Contacted [facility] for DME for home, 3n1 and RW. Pt has crutches at home.  Elliot Cousin, RN 12/16/2014, 11:28 AM

## 2014-12-16 NOTE — Progress Notes (Signed)
     Subjective: 2 Days Post-Op Procedure(s) (LRB): OPEN REDUCTION INTERNAL (ORIF) FIXATION PATELLA (Right)   Patient reports pain as moderate, controlled with medication. No events throughout the night. Ready to be discharged home.  Objective:   VITALS:   Filed Vitals:   12/16/14 0630  BP: 117/80  Pulse: 79  Temp: 98.6 F (37 C)  Resp: 14    Dorsiflexion/Plantar flexion intact Incision: dressing C/D/I No cellulitis present Compartment soft  LABS No new labs   Assessment/Plan: 2 Days Post-Op Procedure(s) (LRB): OPEN REDUCTION INTERNAL (ORIF) FIXATION PATELLA (Right) Dressing changed to 4x4 gauze and tape KI adjust to proper position and explained to the patient Up with therapy Discharge home with home health  Follow up in 2 weeks at [facility] Follow up with Dr Lequita Halt in 2 weeks.  Contact information:  [facility] [address] [place] [address] [phone]        Anastasio Auerbach. Railynn Ballo   PAC  12/16/2014, 7:53 AM

## 2014-12-17 ENCOUNTER — Encounter (HOSPITAL_COMMUNITY): Payer: Self-pay | Admitting: Orthopedic Surgery

## 2014-12-19 ENCOUNTER — Ambulatory Visit: Payer: Medicare Other | Admitting: Medical

## 2014-12-19 ENCOUNTER — Encounter: Payer: Self-pay | Admitting: Medical

## 2014-12-19 VITALS — BP 121/68 | HR 101 | Temp 97.8°F | Ht 66.0 in | Wt 124.0 lb

## 2014-12-19 DIAGNOSIS — S82001A Unspecified fracture of right patella, initial encounter for closed fracture: Secondary | ICD-10-CM

## 2014-12-19 DIAGNOSIS — R55 Syncope and collapse: Secondary | ICD-10-CM
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
## 2014-12-19 NOTE — Progress Notes (Signed)
Pre visit review using our clinic review tool, if applicable. No additional management support is needed unless otherwise documented below in the visit note. 

## 2014-12-21 DIAGNOSIS — Z4789 Encounter for other orthopedic aftercare: Secondary | ICD-10-CM | POA: Diagnosis not present

## 2014-12-21 DIAGNOSIS — S82044D Nondisplaced comminuted fracture of right patella, subsequent encounter for closed fracture with routine healing: Secondary | ICD-10-CM | POA: Diagnosis not present

## 2014-12-21 DIAGNOSIS — M25571 Pain in right ankle and joints of right foot: Secondary | ICD-10-CM | POA: Diagnosis not present

## 2014-12-26 ENCOUNTER — Telehealth: Payer: Self-pay | Admitting: Internal Medicine

## 2014-12-26 NOTE — Telephone Encounter (Signed)
Contacted the pt and he is calling Dr Tenny Craw to see if he can have his follow-up appt moved to a sooner date, sometime in August.  Pt has no cardiac complaints verbalized at this time.  Pt states he needs to have his appt moved to August, for its holding up his DMV records.  Tried explaining to the pt that given he has no cardiac complaints at this time, I would have to forward this message to Dr Tenny Craw and her nurse to review on request of a sooner appt, and follow-up with the pt thereafter.  As I was explaining this to the pt, he then hung the phone up on me.  Tried contacting him numerous times to go over recommendation, and the pt disconnected the phone call x4.  Will route this message to Dr Tenny Craw as an fyi.

## 2014-12-26 NOTE — Telephone Encounter (Signed)
New Message    Pt wants to see if Dr. Stann Mainland see him sooner  He is not having any current medical issues that requires a smart phrase  Pt states that he has a [facility] Medical Records Review and that he needs to see the Dr. In Aug  Please call pt

## 2014-12-27 NOTE — Telephone Encounter (Signed)
Pt will need to call back and schedule I cannot find prior clinic note.

## 2014-12-28 DIAGNOSIS — M25571 Pain in right ankle and joints of right foot: Secondary | ICD-10-CM | POA: Diagnosis not present

## 2014-12-28 DIAGNOSIS — S82044D Nondisplaced comminuted fracture of right patella, subsequent encounter for closed fracture with routine healing: Secondary | ICD-10-CM | POA: Diagnosis not present

## 2014-12-28 DIAGNOSIS — Z4789 Encounter for other orthopedic aftercare: Secondary | ICD-10-CM | POA: Diagnosis not present

## 2015-01-09 ENCOUNTER — Ambulatory Visit (INDEPENDENT_AMBULATORY_CARE_PROVIDER_SITE_OTHER): Payer: Medicare Other | Admitting: Medical

## 2015-01-09 ENCOUNTER — Encounter: Payer: Self-pay | Admitting: Medical

## 2015-01-09 VITALS — BP 108/75 | HR 96 | Wt 132.0 lb

## 2015-01-09 DIAGNOSIS — M25561 Pain in right knee: Secondary | ICD-10-CM | POA: Diagnosis not present

## 2015-01-09 MED ORDER — KETOROLAC TROMETHAMINE 60 MG/2ML IM SOLN
60.0000 mg | Freq: Once | INTRAMUSCULAR | Status: AC
  Administered 2015-01-09: 60 mg via INTRAMUSCULAR
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
## 2015-01-09 NOTE — Progress Notes (Signed)
Pre visit review using our clinic review tool, if applicable. No additional management support is needed unless otherwise documented below in the visit note. 
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
## 2015-01-18 DIAGNOSIS — S82044D Nondisplaced comminuted fracture of right patella, subsequent encounter for closed fracture with routine healing: Secondary | ICD-10-CM | POA: Diagnosis not present

## 2015-01-18 DIAGNOSIS — Z4889 Encounter for other specified surgical aftercare: Secondary | ICD-10-CM | POA: Diagnosis not present

## 2015-01-18 DIAGNOSIS — M25571 Pain in right ankle and joints of right foot: Secondary | ICD-10-CM | POA: Diagnosis not present

## 2015-01-21 ENCOUNTER — Ambulatory Visit: Payer: Self-pay | Admitting: Internal Medicine

## 2015-01-30 ENCOUNTER — Ambulatory Visit: Payer: Medicare Other | Admitting: Internal Medicine

## 2015-01-31 ENCOUNTER — Telehealth: Payer: Self-pay | Admitting: Medical

## 2015-01-31 ENCOUNTER — Encounter: Payer: Self-pay | Admitting: Medical

## 2015-01-31 ENCOUNTER — Ambulatory Visit (INDEPENDENT_AMBULATORY_CARE_PROVIDER_SITE_OTHER): Payer: Medicare Other | Admitting: Medical

## 2015-01-31 VITALS — BP 116/78 | HR 88 | Temp 98.1°F | Resp 16 | Ht 66.0 in | Wt 122.0 lb

## 2015-01-31 DIAGNOSIS — S82044D Nondisplaced comminuted fracture of right patella, subsequent encounter for closed fracture with routine healing: Secondary | ICD-10-CM | POA: Diagnosis not present

## 2015-01-31 DIAGNOSIS — S82001A Unspecified fracture of right patella, initial encounter for closed fracture: Secondary | ICD-10-CM | POA: Diagnosis not present

## 2015-01-31 NOTE — Patient Instructions (Addendum)
Continue the gabapentin and tramadol for pain.  It looks like the neurologist office tried to contact you but they were never able to contact you per our notes. I will get referral staff to investigate. You state you went to neurologist but I can't see that in our records. I will ask referral staff to call you.  Also ask referral staff to investigate the cardiology referral.  Follow up 4 months  or as needed. Ortho would need to fill out their section.
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
## 2015-01-31 NOTE — Progress Notes (Signed)
Pre visit review using our clinic review tool, if applicable. No additional management support is needed unless otherwise documented below in the visit note. 

## 2015-01-31 NOTE — Telephone Encounter (Signed)
Would you check on his neurology and cardiology referral.

## 2015-02-05 DIAGNOSIS — S82044D Nondisplaced comminuted fracture of right patella, subsequent encounter for closed fracture with routine healing: Secondary | ICD-10-CM | POA: Diagnosis not present

## 2015-02-07 DIAGNOSIS — S82044D Nondisplaced comminuted fracture of right patella, subsequent encounter for closed fracture with routine healing: Secondary | ICD-10-CM | POA: Diagnosis not present

## 2015-02-08 ENCOUNTER — Ambulatory Visit: Payer: Medicare Other | Admitting: Internal Medicine

## 2015-02-15 ENCOUNTER — Telehealth: Payer: Self-pay | Admitting: Internal Medicine

## 2015-02-15 NOTE — Telephone Encounter (Signed)
yes

## 2015-02-15 NOTE — Telephone Encounter (Signed)
Pt was no show 01/30/15 1:15pm for follow up appt, He came in 9/8 and f/u with Ramon Dredge, this is 3rd no show for pt, charge for no show?

## 2015-02-19 DIAGNOSIS — S82044D Nondisplaced comminuted fracture of right patella, subsequent encounter for closed fracture with routine healing: Secondary | ICD-10-CM | POA: Diagnosis not present

## 2015-02-19 DIAGNOSIS — Z4789 Encounter for other orthopedic aftercare: Secondary | ICD-10-CM | POA: Diagnosis not present

## 2015-02-21 ENCOUNTER — Ambulatory Visit: Payer: Medicare Other | Admitting: Neurology

## 2015-02-22 ENCOUNTER — Ambulatory Visit: Payer: Medicare Other | Admitting: Neurology

## 2015-02-26 ENCOUNTER — Ambulatory Visit (INDEPENDENT_AMBULATORY_CARE_PROVIDER_SITE_OTHER): Payer: Medicare Other | Admitting: Neurology

## 2015-02-26 ENCOUNTER — Encounter: Payer: Self-pay | Admitting: Neurology

## 2015-02-26 VITALS — BP 104/74 | HR 76 | Ht 66.0 in | Wt 117.2 lb

## 2015-02-26 DIAGNOSIS — Q039 Congenital hydrocephalus, unspecified: Secondary | ICD-10-CM | POA: Diagnosis not present
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
## 2015-03-01 ENCOUNTER — Telehealth: Payer: Self-pay | Admitting: *Deleted

## 2015-03-01 DIAGNOSIS — S82044D Nondisplaced comminuted fracture of right patella, subsequent encounter for closed fracture with routine healing: Secondary | ICD-10-CM | POA: Diagnosis not present

## 2015-03-01 NOTE — Telephone Encounter (Signed)
DMV Forms are filled out by Dr Everlena Cooper and copies made , sent to be scanned in and originals out front for patient to pick up

## 2015-03-13 ENCOUNTER — Telehealth: Payer: Self-pay | Admitting: Neurology

## 2015-03-13 NOTE — Telephone Encounter (Signed)
Called patient back and left message that Dr. Everlena CooperJaffe did not prescribe this and instructed him to call ordering physician.

## 2015-03-13 NOTE — Telephone Encounter (Signed)
Pt called to request a refill of/ Tramadol/ call back @ [phone]

## 2015-03-22 ENCOUNTER — Telehealth: Payer: Self-pay | Admitting: Medical

## 2015-03-22 NOTE — Telephone Encounter (Signed)
I can't help him over the phone, if he has a rash I suggest he use OTC cream at his discretion otherwise needs to be seen. If he has symptoms such as fever chills or generalized rash definitely needs to be seen or go to the urgent care

## 2015-03-22 NOTE — Telephone Encounter (Signed)
[name]   Relationship to patient: Self  Can be reached: [phone]    Reason for call: pt called in because he says that he has a rash. He is sure of it. I informed pt that an appt is probably needed. He says that he gets them all the time and Ramon Dredgedward suggests [employer]. He want to know if that is okay to use. I informed pt that he IS NOT Dr. Drue NovelPaz pt and that Ramon Dredgedward is out of the office. Pt insist on me asking Dr. Drue NovelPaz.

## 2015-03-22 NOTE — Telephone Encounter (Signed)
LMOM informing Pt of Dr. Drue NovelPaz recommendations. Instructed him to call if questions or concerns.

## 2015-03-22 NOTE — Telephone Encounter (Signed)
Please advise 

## 2015-03-26 ENCOUNTER — Telehealth: Payer: Self-pay | Admitting: Medical

## 2015-03-26 NOTE — Telephone Encounter (Signed)
Left message for pt to call back  °

## 2015-03-26 NOTE — Telephone Encounter (Signed)
[name]  Relationship to patient: Self   Can be [phone]  Reason for call: Pt says that he would like to go back to work, he says that it will be part time with no physical labor. He would like to know if provider will okay it .Marland Kitchen.   Please advise.    Thanks.

## 2015-03-26 NOTE — Telephone Encounter (Signed)
Spoke with pt and he said that it would no driving or any physical work involved. He also has some papers he would like you to sign and he will be by this week to drop off that paperwork.

## 2015-03-26 NOTE — Telephone Encounter (Signed)
You can go ahead and write return to work note and I will sign. Will discuss wording of note when in office.

## 2015-03-27 NOTE — Telephone Encounter (Signed)
Spoke with pt and he voices understanding.  

## 2015-04-01 ENCOUNTER — Telehealth: Payer: Self-pay | Admitting: Neurology

## 2015-04-01 ENCOUNTER — Telehealth: Payer: Self-pay | Admitting: Medical

## 2015-04-01 DIAGNOSIS — Z0100 Encounter for examination of eyes and vision without abnormal findings: Secondary | ICD-10-CM

## 2015-04-01 NOTE — Telephone Encounter (Signed)
Pt wants to have a refill called in on tramadol to the [facility] [address] pt phone number is [phone]

## 2015-04-01 NOTE — Telephone Encounter (Signed)
Pt is wanting referral to optomology. He is needing a routine eye exam. Please f/u with pt.

## 2015-04-01 NOTE — Telephone Encounter (Signed)
Left message for pt. We've never prescribed Tramadol for pt. Looks like it was being managed by PCP.

## 2015-04-01 NOTE — Telephone Encounter (Signed)
Spoke with pt and he needs an eye exam from a doctor for his job.

## 2015-04-01 NOTE — Telephone Encounter (Signed)
Pt was needing eye exam for his job. Referral has been put in. Pt was made aware that someone would be in touch with him in a few days. FYI.

## 2015-04-01 NOTE — Telephone Encounter (Signed)
I got a note stating his work requires that he is see a ophthalmologist. Someone put in referral to one. Would you clarify if that is the case. If he just needs quick visual acuity then optometrist could lkely see him sooner. Would you clarify with pt.

## 2015-04-02 ENCOUNTER — Encounter: Payer: Self-pay | Admitting: Medical

## 2015-04-12 DIAGNOSIS — S82044D Nondisplaced comminuted fracture of right patella, subsequent encounter for closed fracture with routine healing: Secondary | ICD-10-CM | POA: Diagnosis not present

## 2015-05-07 ENCOUNTER — Telehealth: Payer: Self-pay | Admitting: Medical

## 2015-05-07 NOTE — Telephone Encounter (Signed)
Flu Shot Declined: Patient states he does not take flu shots

## 2015-05-07 NOTE — Telephone Encounter (Signed)
Documented 05/07/15 in Health Maintenance.

## 2015-05-09 ENCOUNTER — Telehealth: Payer: Self-pay | Admitting: Medical

## 2015-05-09 NOTE — Telephone Encounter (Signed)
Caller name: Self   Can be reached: [phone]   Reason for call: [facility] to [facility] @ [address], [place], [place] [address]  Phone: [phone]

## 2015-05-09 NOTE — Telephone Encounter (Signed)
Recommend he come in for check of that area. He may need antibiotics. If I refer without checking area first and area is infected he may have to see them twice. Also it is standard to actually see and refer.  Give him time early in day so he can be in and out quick.

## 2015-05-09 NOTE — Telephone Encounter (Signed)
Patient states that he has an ingrown toenail and would like a referral.  Ramon DredgeEdward see note below and please advise.

## 2015-05-10 NOTE — Telephone Encounter (Signed)
Left message for pt to call back and schedule an appointment before the referral will be placed.

## 2015-05-13 ENCOUNTER — Ambulatory Visit (INDEPENDENT_AMBULATORY_CARE_PROVIDER_SITE_OTHER): Payer: Medicare Other | Admitting: Medical

## 2015-05-13 ENCOUNTER — Encounter (HOSPITAL_BASED_OUTPATIENT_CLINIC_OR_DEPARTMENT_OTHER): Payer: Self-pay | Admitting: *Deleted

## 2015-05-13 ENCOUNTER — Emergency Department (HOSPITAL_BASED_OUTPATIENT_CLINIC_OR_DEPARTMENT_OTHER): Payer: Medicare Other

## 2015-05-13 ENCOUNTER — Encounter: Payer: Self-pay | Admitting: Medical

## 2015-05-13 ENCOUNTER — Inpatient Hospital Stay (HOSPITAL_BASED_OUTPATIENT_CLINIC_OR_DEPARTMENT_OTHER)
Admission: EM | Admit: 2015-05-13 | Discharge: 2015-05-20 | DRG: 390 | Disposition: A | Discharge status: Home / Self Care | Payer: Medicare Other | Attending: General Surgery | Admitting: General Surgery

## 2015-05-13 VITALS — BP 120/78 | HR 92 | Temp 98.0°F | Ht 66.0 in | Wt 122.0 lb

## 2015-05-13 DIAGNOSIS — R1084 Generalized abdominal pain: Secondary | ICD-10-CM | POA: Diagnosis not present

## 2015-05-13 DIAGNOSIS — K5660 Unspecified intestinal obstruction: Secondary | ICD-10-CM | POA: Diagnosis not present

## 2015-05-13 DIAGNOSIS — Z8719 Personal history of other diseases of the digestive system: Secondary | ICD-10-CM | POA: Diagnosis not present

## 2015-05-13 DIAGNOSIS — K56609 Unspecified intestinal obstruction, unspecified as to partial versus complete obstruction: Secondary | ICD-10-CM

## 2015-05-13 DIAGNOSIS — Z833 Family history of diabetes mellitus: Secondary | ICD-10-CM

## 2015-05-13 DIAGNOSIS — K565 Intestinal adhesions [bands], unspecified as to partial versus complete obstruction: Secondary | ICD-10-CM | POA: Diagnosis present

## 2015-05-13 DIAGNOSIS — K5669 Other intestinal obstruction: Secondary | ICD-10-CM | POA: Diagnosis not present

## 2015-05-13 DIAGNOSIS — K5900 Constipation, unspecified: Secondary | ICD-10-CM

## 2015-05-13 DIAGNOSIS — R109 Unspecified abdominal pain: Secondary | ICD-10-CM | POA: Diagnosis not present

## 2015-05-13 DIAGNOSIS — L600 Ingrowing nail: Secondary | ICD-10-CM

## 2015-05-13 DIAGNOSIS — R140 Abdominal distension (gaseous): Secondary | ICD-10-CM | POA: Diagnosis not present

## 2015-05-13 DIAGNOSIS — Z0189 Encounter for other specified special examinations: Secondary | ICD-10-CM

## 2015-05-13 LAB — BASIC METABOLIC PANEL
Anion gap: 8 (ref 5–15)
BUN: 13 mg/dL (ref 6–20)
CHLORIDE: 106 mmol/L (ref 101–111)
CO2: 28 mmol/L (ref 22–32)
CREATININE: 0.81 mg/dL (ref 0.61–1.24)
Calcium: 9.4 mg/dL (ref 8.9–10.3)
GFR calc non Af Amer: >60 mL/min (ref >60)
GLUCOSE: 108 mg/dL — AB (ref 65–99)
Potassium: 3.9 mmol/L (ref 3.5–5.1)
Sodium: 142 mmol/L (ref 135–145)

## 2015-05-13 LAB — URINE MICROSCOPIC-ADD ON

## 2015-05-13 LAB — URINALYSIS, ROUTINE W REFLEX MICROSCOPIC
Glucose, UA: NEGATIVE mg/dL
KETONES UR: 40 mg/dL — AB
Leukocytes, UA: NEGATIVE
Nitrite: NEGATIVE
PROTEIN: 30 mg/dL — AB
Specific Gravity, Urine: 1.028 (ref 1.005–1.030)
pH: 5.5 (ref 5.0–8.0)

## 2015-05-13 LAB — CBC
HEMATOCRIT: 42 % (ref 39.0–52.0)
HEMOGLOBIN: 13.8 g/dL (ref 13.0–17.0)
MCH: 27.5 pg (ref 26.0–34.0)
MCHC: 32.9 g/dL (ref 30.0–36.0)
MCV: 83.7 fL (ref 78.0–100.0)
Platelets: 240 10*3/uL (ref 150–400)
RBC: 5.02 MIL/uL (ref 4.22–5.81)
RDW: 15 % (ref 11.5–15.5)
WBC: 9.7 10*3/uL (ref 4.0–10.5)

## 2015-05-13 LAB — CBC WITH DIFFERENTIAL/PLATELET
BASOS PCT: 0 %
Basophils Absolute: 0.1 10*3/uL (ref 0.0–0.1)
Eosinophils Absolute: 0 10*3/uL (ref 0.0–0.7)
Eosinophils Relative: 0 %
HCT: 42.3 % (ref 39.0–52.0)
HEMOGLOBIN: 14 g/dL (ref 13.0–17.0)
Lymphocytes Relative: 6 %
Lymphs Abs: 0.6 10*3/uL — ABNORMAL LOW (ref 0.7–4.0)
MCH: 27.5 pg (ref 26.0–34.0)
MCHC: 33.1 g/dL (ref 30.0–36.0)
MCV: 82.9 fL (ref 78.0–100.0)
MONO ABS: 0.6 10*3/uL (ref 0.1–1.0)
MONOS PCT: 6 %
NEUTROS PCT: 88 %
Neutro Abs: 9.9 10*3/uL — ABNORMAL HIGH (ref 1.7–7.7)
PLATELETS: 251 10*3/uL (ref 150–400)
RBC: 5.1 MIL/uL (ref 4.22–5.81)
RDW: 15.3 % (ref 11.5–15.5)
WBC: 11.3 10*3/uL — ABNORMAL HIGH (ref 4.0–10.5)

## 2015-05-13 LAB — COMPREHENSIVE METABOLIC PANEL
ALT: 11 U/L — ABNORMAL LOW (ref 17–63)
ANION GAP: 8 (ref 5–15)
AST: 19 U/L (ref 15–41)
Albumin: 4.2 g/dL (ref 3.5–5.0)
Alkaline Phosphatase: 59 U/L (ref 38–126)
BILIRUBIN TOTAL: 0.9 mg/dL (ref 0.3–1.2)
BUN: 15 mg/dL (ref 6–20)
CO2: 31 mmol/L (ref 22–32)
Calcium: 9.2 mg/dL (ref 8.9–10.3)
Chloride: 97 mmol/L — ABNORMAL LOW (ref 101–111)
Creatinine, Ser: 0.84 mg/dL (ref 0.61–1.24)
GFR calc non Af Amer: >60 mL/min (ref >60)
Glucose, Bld: 119 mg/dL — ABNORMAL HIGH (ref 65–99)
POTASSIUM: 3.3 mmol/L — AB (ref 3.5–5.1)
Sodium: 136 mmol/L (ref 135–145)
Total Protein: 6.9 g/dL (ref 6.5–8.1)

## 2015-05-13 LAB — LIPASE, BLOOD: Lipase: 23 U/L (ref 11–51)

## 2015-05-13 LAB — I-STAT CG4 LACTIC ACID, ED: Lactic Acid, Venous: 1 mmol/L (ref 0.5–2.0)

## 2015-05-13 MED ORDER — ONDANSETRON HCL 4 MG/2ML IJ SOLN: 4.0000 mg | Freq: Four times a day (QID) | INTRAMUSCULAR | Status: DC | PRN

## 2015-05-13 MED ORDER — IOHEXOL 300 MG/ML  SOLN
100.0000 mL | Freq: Once | INTRAMUSCULAR | Status: AC | PRN
  Administered 2015-05-13: 100 mL via INTRAVENOUS

## 2015-05-13 MED ORDER — ENOXAPARIN SODIUM 40 MG/0.4ML ~~LOC~~ SOLN
40.0000 mg | SUBCUTANEOUS | Status: DC
  Administered 2015-05-14 – 2015-05-18 (×6): 40 mg via SUBCUTANEOUS
  Filled 2015-05-13 (×9): qty 0.4

## 2015-05-13 MED ORDER — KCL IN DEXTROSE-NACL 20-5-0.45 MEQ/L-%-% IV SOLN
INTRAVENOUS | Status: DC
Start: 2015-05-13 — End: 2015-05-20
  Administered 2015-05-14: 08:00:00 via INTRAVENOUS
  Administered 2015-05-14 – 2015-05-15 (×4): 125 mL/h via INTRAVENOUS
  Administered 2015-05-15 – 2015-05-16 (×2): via INTRAVENOUS
  Administered 2015-05-16: 125 mL/h via INTRAVENOUS
  Administered 2015-05-16 – 2015-05-20 (×9): via INTRAVENOUS
  Filled 2015-05-13 (×25): qty 1000

## 2015-05-13 MED ORDER — SODIUM CHLORIDE 0.9 % IV BOLUS (SEPSIS)
1000.0000 mL | Freq: Once | INTRAVENOUS | Status: AC
  Administered 2015-05-13: 1000 mL via INTRAVENOUS

## 2015-05-13 MED ORDER — ONDANSETRON 4 MG PO TBDP
4.0000 mg | ORAL_TABLET | Freq: Four times a day (QID) | ORAL | Status: DC | PRN
Start: 2015-05-13 — End: 2015-05-20

## 2015-05-13 MED ORDER — ACETAMINOPHEN 650 MG RE SUPP: 650.0000 mg | Freq: Four times a day (QID) | RECTAL | Status: DC | PRN

## 2015-05-13 MED ORDER — MORPHINE SULFATE (PF) 4 MG/ML IV SOLN
4.0000 mg | Freq: Once | INTRAVENOUS | Status: AC
  Administered 2015-05-13: 4 mg via INTRAVENOUS
  Filled 2015-05-13: qty 1

## 2015-05-13 MED ORDER — PANTOPRAZOLE SODIUM 40 MG IV SOLR
40.0000 mg | Freq: Every day | INTRAVENOUS | Status: DC
  Administered 2015-05-14 – 2015-05-19 (×7): 40 mg via INTRAVENOUS
  Filled 2015-05-13 (×7): qty 40

## 2015-05-13 MED ORDER — ONDANSETRON HCL 4 MG/2ML IJ SOLN
4.0000 mg | Freq: Once | INTRAMUSCULAR | Status: AC
  Administered 2015-05-13: 4 mg via INTRAVENOUS
  Filled 2015-05-13: qty 2

## 2015-05-13 MED ORDER — ACETAMINOPHEN 325 MG PO TABS: 650.0000 mg | ORAL_TABLET | Freq: Four times a day (QID) | ORAL | Status: DC | PRN

## 2015-05-13 MED ORDER — HYDROMORPHONE HCL 1 MG/ML IJ SOLN
1.0000 mg | INTRAMUSCULAR | Status: DC | PRN
  Administered 2015-05-14 – 2015-05-20 (×22): 1 mg via INTRAVENOUS
  Filled 2015-05-13 (×25): qty 1

## 2015-05-13 NOTE — ED Notes (Signed)
Bed: [id number] Expected date:  Expected time:  Means of arrival:  Comments: [facility] [place], [place]-call Dr. Gerrit FriendsGerkin

## 2015-05-13 NOTE — ED Notes (Signed)
abd pain history of Bowel obstruction.

## 2015-05-13 NOTE — H&P (Signed)
[name] is an 59 y.o. male.   General Surgery [facility]  Chief Complaint: abdominal pain, small bowel obstruction  HPI: patient is a 59 yo WM with hx of imperforate anus as an infant.  Hx of SBO 3 years ago with complex hospital course and laparotomy.  Over past 2 days developed abdominal pain, abdominal distension, nausea and emesis.  Took Pepto-Bismol tablets without relief. Seen today at [facility] and CT scan shows small bowel obstruction with transition point in mid ileum.  Transferred to [facility] for surgical evaluation and management.  Past Medical History  Diagnosis Date  . H/O: GI bleed   . Idiopathic scoliosis   . Colonic inertia     with chronic lifelong costipation  . Congenital fusion of cervical spine   . 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 Anxiety and depression 03/14/2010  . FECAL INCONTINENCE 08/19/2007  . Hydrocephalus --per CT 04/2014 07/23/2014  . Anxiety   . Depression     Past Surgical History  Procedure Laterality Date  . Left knee surgery  1994, 1992  . Hernia repair  1980    left inguinal hernia  . Surgery for imperforate anus  1958  . Upper gastrointestinal endoscopy  05/15/03  . Colonoscopy  11/08/2006  . Laparotomy  07/18/2011    Procedure: EXPLORATORY LAPAROTOMY;  Surgeon: Edward Jolly, MD;  Location: [facility];  Service: General;  Laterality: N/A;  lysis of adhesions entero enterostomy  . Laparotomy  07/28/2011    Procedure: EXPLORATORY LAPAROTOMY;  Surgeon: Edward Jolly, MD;  Location: [facility];  Service: General;  Laterality: N/A;  . Orif patella Right 12/14/2014    Procedure: OPEN REDUCTION INTERNAL (ORIF) FIXATION PATELLA;  Surgeon: Gaynelle Arabian, MD;  Location: [facility];  Service: Orthopedics;  Laterality: Right;    Family History  Problem Relation Age of Onset  . Alzheimer's disease Father   . Diabetes Mother   . Colon cancer Neg Hx   . Prostate cancer Neg Hx    Social History:  reports that he has never smoked. He  has never used smokeless tobacco. He reports that he does not drink alcohol or use illicit drugs.  Allergies: No Known Allergies   (Not in a hospital admission)  Results for orders placed or performed during the hospital encounter of 05/13/15 (from the past 48 hour(s))  Comprehensive metabolic panel     Status: Abnormal   Collection Time: 05/13/15  4:50 PM  Result Value Ref Range   Sodium 136 135 - 145 mmol/L   Potassium 3.3 (L) 3.5 - 5.1 mmol/L   Chloride 97 (L) 101 - 111 mmol/L   CO2 31 22 - 32 mmol/L   Glucose, Bld 119 (H) 65 - 99 mg/dL   BUN 15 6 - 20 mg/dL   Creatinine, Ser 0.84 0.61 - 1.24 mg/dL   Calcium 9.2 8.9 - 10.3 mg/dL   Total Protein 6.9 6.5 - 8.1 g/dL   Albumin 4.2 3.5 - 5.0 g/dL   AST 19 15 - 41 U/L   ALT 11 (L) 17 - 63 U/L   Alkaline Phosphatase 59 38 - 126 U/L   Total Bilirubin 0.9 0.3 - 1.2 mg/dL   GFR calc non Af Amer >60 >60 mL/min   GFR calc Af Amer >60 >60 mL/min    Comment: (NOTE) The eGFR has been calculated using the CKD EPI equation. This calculation has not been validated in all clinical situations. eGFR's persistently <60 mL/min signify possible  Chronic Kidney Disease.    Anion gap 8 5 - 15  Lipase, blood     Status: None   Collection Time: 05/13/15  4:50 PM  Result Value Ref Range   Lipase 23 11 - 51 U/L  CBC with Differential     Status: Abnormal   Collection Time: 05/13/15  4:50 PM  Result Value Ref Range   WBC 11.3 (H) 4.0 - 10.5 K/uL   RBC 5.10 4.22 - 5.81 MIL/uL   Hemoglobin 14.0 13.0 - 17.0 g/dL   HCT 42.3 39.0 - 52.0 %   MCV 82.9 78.0 - 100.0 fL   MCH 27.5 26.0 - 34.0 pg   MCHC 33.1 30.0 - 36.0 g/dL   RDW 15.3 11.5 - 15.5 %   Platelets 251 150 - 400 K/uL   Neutrophils Relative % 88 %   Neutro Abs 9.9 (H) 1.7 - 7.7 K/uL   Lymphocytes Relative 6 %   Lymphs Abs 0.6 (L) 0.7 - 4.0 K/uL   Monocytes Relative 6 %   Monocytes Absolute 0.6 0.1 - 1.0 K/uL   Eosinophils Relative 0 %   Eosinophils Absolute 0.0 0.0 - 0.7 K/uL    Basophils Relative 0 %   Basophils Absolute 0.1 0.0 - 0.1 K/uL  Urinalysis, Routine w reflex microscopic     Status: Abnormal   Collection Time: 05/13/15  4:55 PM  Result Value Ref Range   Color, Urine AMBER (A) YELLOW    Comment: BIOCHEMICALS MAY BE AFFECTED BY COLOR   APPearance CLEAR CLEAR   Specific Gravity, Urine 1.028 1.005 - 1.030   pH 5.5 5.0 - 8.0   Glucose, UA NEGATIVE NEGATIVE mg/dL   Hgb urine dipstick TRACE (A) NEGATIVE   Bilirubin Urine SMALL (A) NEGATIVE   Ketones, ur 40 (A) NEGATIVE mg/dL   Protein, ur 30 (A) NEGATIVE mg/dL   Nitrite NEGATIVE NEGATIVE   Leukocytes, UA NEGATIVE NEGATIVE  Urine microscopic-add on     Status: Abnormal   Collection Time: 05/13/15  4:55 PM  Result Value Ref Range   Squamous Epithelial / LPF 0-5 (A) NONE SEEN   WBC, UA 0-5 0 - 5 WBC/hpf   RBC / HPF 0-5 0 - 5 RBC/hpf   Bacteria, UA FEW (A) NONE SEEN   Urine-Other AMORPHOUS URATES/PHOSPHATES   I-Stat CG4 Lactic Acid, ED     Status: None   Collection Time: 05/13/15  4:58 PM  Result Value Ref Range   Lactic Acid, Venous 1.00 0.5 - 2.0 mmol/L   Ct Abdomen Pelvis W Contrast  05/13/2015  CLINICAL DATA:  Generalized abdominal pain with constipation EXAM: CT ABDOMEN AND PELVIS WITH CONTRAST TECHNIQUE: Multidetector CT imaging of the abdomen and pelvis was performed using the standard protocol following bolus administration of intravenous contrast. CONTRAST:  135m OMNIPAQUE IOHEXOL 300 MG/ML  SOLN COMPARISON:  July 28, 2011 FINDINGS: Lower chest: There is mild bibasilar atelectasis. Lung bases are otherwise clear. Hepatobiliary: No focal liver lesions are identified. The gallbladder wall is not appreciably thickened. There is no biliary duct dilatation. Pancreas: No pancreatic mass or inflammatory focus is appreciable. Spleen: No splenic lesions are identified. Adrenals/Urinary Tract: Adrenals appear normal bilaterally. 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 There is no renal mass or hydronephrosis on either  side. There is no renal or ureteral calculus on either side. Urinary bladder is midline with wall thickness within normal limits. Stomach/Bowel: There is diffuse stool throughout the entire colon. There is no appreciable colonic wall thickening. There is diffuse small bowel  dilatation to the level of the mid to distal ileum consistent with a degree of bowel obstruction with transition zone in the mid to distal ileal region. There is no free air or portal venous air. Vascular/Lymphatic: There is no abdominal aortic aneurysm. The major mesenteric vessels are patent. There is no appreciable adenopathy in the abdomen or pelvis. Reproductive: There are prostatic calculi. Prostate is upper normal in size. The seminal vesicles appear unremarkable. Other: Appendix appears unremarkable. No abscess or ascites is appreciable in the abdomen or pelvis. Musculoskeletal: There is lumbar dextroscoliosis. No blastic or lytic bone lesions are appreciable. No intramuscular lesions or abdominal wall lesions are appreciable. IMPRESSION: Small bowel obstruction with transition zone at mid to distal ileal level. No free air apparent. Diffuse stool throughout the colon consistent with constipation. No colonic wall thickening. Multiple prostatic calculi. Prostate upper normal in size. This finding may warrant correlation with PSA. 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 Electronically Signed   By: Lowella Grip III M.D.   On: 05/13/2015 18:43    Review of Systems  Constitutional: Negative.   HENT: Negative.   Eyes: Negative.   Respiratory: Negative.   Cardiovascular: Negative.   Gastrointestinal: Positive for nausea, vomiting, abdominal pain and constipation. Negative for blood in stool.  Genitourinary: Negative.   Musculoskeletal: Negative.   Skin: Negative.   Neurological: Negative.   Endo/Heme/Allergies: Negative.   Psychiatric/Behavioral: Negative.     Blood pressure 147/82, pulse 93,  temperature 98.9 F (37.2 C), temperature source Oral, resp. rate 18, height _0  (1.676 m), weight 55.339 kg (122 lb), SpO2 97 %. Physical Exam  Constitutional: He is oriented to person, place, and time. He appears well-developed and well-nourished. No distress.  HENT:  Head: Normocephalic and atraumatic.  Right Ear: External ear normal.  Left Ear: External ear normal.  Eyes: Conjunctivae are normal. Pupils are equal, round, and reactive to light. No scleral icterus.  Neck: Normal range of motion. Neck supple. No tracheal deviation present. No thyromegaly present.  Cardiovascular: Normal rate, regular rhythm and normal heart sounds.   Respiratory: Effort normal and breath sounds normal. No respiratory distress. He has no wheezes.  GI: Soft. Bowel sounds are normal. He exhibits distension (moderate). He exhibits no mass. There is tenderness (mild, diffuse). There is no rebound and no guarding.  Well healed midline incision and left inguinal incision, no hernia  Musculoskeletal: Normal range of motion. He exhibits no edema or tenderness.  Neurological: He is alert and oriented to person, place, and time.  Skin: Skin is warm and dry. He is not diaphoretic.  Psychiatric: He has a normal mood and affect. His behavior is normal.     Assessment/Plan Small bowel obstruction, likely secondary to adhesions  Admit to surgical service  NPO, IV hydration  Will place NG tube to low intermittent suction  Follow up AXR in AM 12/20  Rx pain, nausea  Discussed plan with patient who agrees to proceed as outlined.  Earnstine Regal, MD, [facility] Office: [place] 05/13/2015, 9:16 PM

## 2015-05-13 NOTE — ED Provider Notes (Signed)
[name]  No needs or concerns at this time.  General surgery, Dr. Gerrit FriendsGerkin, paged and notified of patient's arrival.  Oletha BlendLisa M Salwa Bai, PA-C 05/13/15 2128  Lorre NickAnthony Allen, MD 05/13/15 [phone]

## 2015-05-13 NOTE — Progress Notes (Signed)
Subjective:    Patient ID: [name], male    DOB: 05/08/1956, 59 y.o.   MRN: [id number]  HPI  Pt in for feeling constipated and vomiting. He has vomited 5 times in 2 days. Pt last bowel movement was this am but small/hard stool(last 2 days same small bm pattern. Pt has history of abdominal surgery. One for imperforate anus as child. Then he had 2 other bowel obstruction episodes as adult(last one was in 2013). Also he had inguinal hernia repair.   Pt feels bloated. Feels like he can take he can't take deep breath due to bloating.  Belching also.  Pain is 8/10.   Seconday issue severe rt great toe nail thick and painful sides.    Review of Systems  Constitutional: Positive for fatigue. Negative for fever.  Respiratory: Negative for cough, choking, shortness of breath and wheezing.   Cardiovascular: Negative for chest pain and palpitations.  Gastrointestinal: Positive for nausea, vomiting and abdominal pain.  Musculoskeletal: Negative for back pain.  Skin: Negative for rash.       Rt great toe nail ingrown. Very thick.  Neurological: Negative for dizziness and headaches.  Hematological: Negative for adenopathy. Does not bruise/bleed easily.   Past Medical History  Diagnosis Date  . H/O: GI bleed   . Idiopathic scoliosis   . Colonic inertia     with chronic lifelong costipation  . Congenital fusion of cervical spine   . 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 Anxiety and depression 03/14/2010  . FECAL INCONTINENCE 08/19/2007  . Hydrocephalus --per CT 04/2014 07/23/2014  . Anxiety   . Depression     Social History   Social History  . Marital Status: Single    Spouse Name: N/A  . Number of Children: 0  . Years of Education: N/A   Occupational History  . 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 Smoking status: Never Smoker   . Smokeless tobacco: Never Used  . Alcohol Use: No     Comment: socially   . Drug Use: No  . Sexual  Activity: No   Other Topics Concern  . Not on file   Social History Narrative   Lives w/ mother , has a younger brother and sister     Past Surgical History  Procedure Laterality Date  . Left knee surgery  1994, 1992  . Hernia repair  1980    left inguinal hernia  . Surgery for imperforate anus  1958  . Upper gastrointestinal endoscopy  05/15/03  . Colonoscopy  11/08/2006  . Laparotomy  07/18/2011    Procedure: EXPLORATORY LAPAROTOMY;  Surgeon: Mariella Saa, MD;  Location: [facility];  Service: General;  Laterality: N/A;  lysis of adhesions entero enterostomy  . Laparotomy  07/28/2011    Procedure: EXPLORATORY LAPAROTOMY;  Surgeon: Mariella Saa, MD;  Location: [facility];  Service: General;  Laterality: N/A;  . Orif patella Right 12/14/2014    Procedure: OPEN REDUCTION INTERNAL (ORIF) FIXATION PATELLA;  Surgeon: Ollen Gross, MD;  Location: [facility];  Service: Orthopedics;  Laterality: Right;    Family History  Problem Relation Age of Onset  . Alzheimer's disease Father   . Diabetes Mother   . Colon cancer Neg Hx   . Prostate cancer Neg Hx     No Known Allergies  Current Outpatient Prescriptions on File Prior to Visit  Medication Sig Dispense Refill  . ALPRAZolam (XANAX) 1 MG tablet  TAKE 1 TABLET 3 TIMES DAILY. MAY REFILL ON 03/13/2015.  0  . Multiple Vitamin (MULTIVITAMIN) capsule Take 2 capsules by mouth daily.    . traMADol (ULTRAM) 50 MG tablet Take by mouth every 6 (six) hours as needed. Reported on 05/13/2015     No current facility-administered medications on file prior to visit.    BP 120/78 mmHg  Pulse 92  Temp(Src) 98 F (36.7 C) (Oral)  Ht 5\' 6"  (1.676 m)  Wt 122 lb (55.339 kg)  BMI 19.70 kg/m2  SpO2 99%       Objective:   Physical Exam  General Appearance- Not in acute distress.  HEENT Eyes- Scleraeral/Conjuntiva-bilat- Not Yellow. Mouth & Throat- Normal.  Chest and Lung Exam Auscultation: Breath sounds:-Normal. Adventitious sounds:- No  Adventitious sounds.  Cardiovascular Auscultation:Rythm - Regular. Heart Sounds -Normal heart sounds.  Abdomen Inspection:-Inspection Normal.  Palpation/Perucssion: Palpation and Percussion of the abdomen reveal-  Very Tender abdomen, rigid feel, +bs. Large scar midline but  No Palpable abdominal masses.  Liver:-Normal.  Spleen:- Normal.   Back- no cva tenderness.  Rt foot- great toe very thick nail great toe. Mild tender both edges.      Assessment & Plan:  For your abdomen pain and constipation, I did talk with ED physician working and notified her of your recent presentation and history of bowel obstruction.  Please go down now for further work up/evaluation.  Follow up with us in time frame ED determines after they evaluate you.   Note did go ahead and refer him to podiatrist. Will ask that be made 2-3 weeks out in event pt hospitalized for the above.

## 2015-05-13 NOTE — Patient Instructions (Signed)
For your abdomen pain and constipation, I did talk with ED physician working and notified her of your recent presentation and history of bowel obstruction.  Please go down now for further work up/evaluation.  Follow up with us in time frame ED determines after they evaluate you.

## 2015-05-13 NOTE — ED Provider Notes (Signed)
CSN: [id number]     Arrival date & time 05/13/15  1508 History  By signing my name below, I, Bethel Born, attest that this documentation has been prepared under the direction and in the presence of Laurence Spates, MD. Electronically Signed: Bethel Born, ED Scribe. 05/13/2015. 7:20 PM   Chief Complaint  Patient presents with  . Abdominal Pain   The history is provided by the patient. No language interpreter was used.  [name] is a 59 y.o. male with history of SBO, colonic inertia, GI bleeding, who presents to the Emergency Department complaining of intermittent, 8/10 in severity,  Diffuse abdominal pain with onset 2 days ago. The pain is most severe at the mid abdomen and exacerbated by eating. Pt notes that this pain is similar to pain that he had in the past with a bowel obstruction.  Pepto Bismol provided insufficient pain relief at home. Associated symptoms include subjective fever, constipation for 3 days (pt has been passing small pellets), non-bloody, non-bilious emesis for 2 days. Pt denies cough or other cold symptoms and difficulty urinating. He has had no known sick contact.   Past Medical History  Diagnosis Date  . H/O: GI bleed   . Idiopathic scoliosis   . Colonic inertia     with chronic lifelong costipation  . Congenital fusion of cervical spine   . 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 Anxiety and depression 03/14/2010  . FECAL INCONTINENCE 08/19/2007  . Hydrocephalus --per CT 04/2014 07/23/2014  . Anxiety   . Depression    Past Surgical History  Procedure Laterality Date  . Left knee surgery  1994, 1992  . Hernia repair  1980    left inguinal hernia  . Surgery for imperforate anus  1958  . Upper gastrointestinal endoscopy  05/15/03  . Colonoscopy  11/08/2006  . Laparotomy  07/18/2011    Procedure: EXPLORATORY LAPAROTOMY;  Surgeon: Mariella Saa, MD;  Location: [facility];  Service: General;  Laterality: N/A;  lysis of adhesions entero enterostomy  .  Laparotomy  07/28/2011    Procedure: EXPLORATORY LAPAROTOMY;  Surgeon: Mariella Saa, MD;  Location: [facility];  Service: General;  Laterality: N/A;  . Orif patella Right 12/14/2014    Procedure: OPEN REDUCTION INTERNAL (ORIF) FIXATION PATELLA;  Surgeon: Ollen Gross, MD;  Location: [facility];  Service: Orthopedics;  Laterality: Right;   Family History  Problem Relation Age of Onset  . Alzheimer's disease Father   . Diabetes Mother   . Colon cancer Neg Hx   . Prostate cancer Neg Hx    Social History  Substance Use Topics  . Smoking status: Never Smoker   . Smokeless tobacco: Never Used  . Alcohol Use: No     Comment: socially     Review of Systems 10 Systems reviewed and all are negative for acute change except as noted in the HPI. Allergies  Review of patient's allergies indicates no known allergies.  Home Medications   Prior to Admission medications   Medication Sig Start Date End Date Taking? Authorizing Provider  ALPRAZolam Prudy Feeler) 1 MG tablet 0.5mg  oral daily as needed for anxiety 02/11/15  Yes Historical Provider, MD  bismuth subsalicylate (PEPTO BISMOL) 262 MG chewable tablet Chew 524 mg by mouth as needed for indigestion.   Yes Historical Provider, MD  Multiple Vitamin (MULTIVITAMIN) capsule Take 1 capsule by mouth daily.    Yes Historical Provider, MD  traMADol (ULTRAM) 50 MG tablet Take by mouth every 6 (six) hours as  needed. Reported on 05/13/2015    Historical Provider, MD   BP 139/83 mmHg  Pulse 91  Temp(Src) 98.6 F (37 C) (Oral)  Resp 20  Ht  (1.676 m)  Wt 122 lb (55.339 kg)  BMI 19.70 kg/m2  SpO2 100% Physical Exam  Constitutional: He is oriented to person, place, and time. No distress.  Thin and chronically ill appearing.   HENT:  Head: Normocephalic and atraumatic.  Moist mucous membranes Poor dentition  Eyes: Conjunctivae are normal. Pupils are equal, round, and reactive to light.  Neck: Neck supple.  Cardiovascular: Normal rate, regular rhythm  and normal heart sounds.   No murmur heard. Pulmonary/Chest: Effort normal and breath sounds normal.  Abdominal: Soft. He exhibits no distension.  High pitched bowel sounds Mild diffuse TTP worst in periumbilical abdomen with no rebound or guarding. No peritonitis.  Musculoskeletal: He exhibits no edema.  Neurological: He is alert and oriented to person, place, and time.  Fluent speech  Skin: Skin is warm and dry.  Psychiatric: He has a normal mood and affect. Judgment normal.  Nursing note and vitals reviewed.   ED Course  Procedures (including critical care time) DIAGNOSTIC STUDIES: Oxygen Saturation is 100% on RA,  normal by my interpretation.    COORDINATION OF CARE: 4:45 PM Discussed treatment plan which includes Zofran, pain management, CT A/P and lab work with pt at bedside and pt agreed to plan.  6:55 PM-Consult complete with Dr. Gerrit Friends (General Surgery). Patient case explained and discussed. Call ended at 6:57 PM.  6:58 PM D/w Dr. Cyndie Chime in the ED at [facility].   Labs Review Labs Reviewed  COMPREHENSIVE METABOLIC PANEL - Abnormal; Notable for the following:    Potassium 3.3 (*)    Chloride 97 (*)    Glucose, Bld 119 (*)    ALT 11 (*)    All other components within normal limits  CBC WITH DIFFERENTIAL/PLATELET - Abnormal; Notable for the following:    WBC 11.3 (*)    Neutro Abs 9.9 (*)    Lymphs Abs 0.6 (*)    All other components within normal limits  URINALYSIS, ROUTINE W REFLEX MICROSCOPIC (NOT AT [facility]) - Abnormal; Notable for the following:    Color, Urine AMBER (*)    Hgb urine dipstick TRACE (*)    Bilirubin Urine SMALL (*)    Ketones, ur 40 (*)    Protein, ur 30 (*)    All other components within normal limits  URINE MICROSCOPIC-ADD ON - Abnormal; Notable for the following:    Squamous Epithelial / LPF 0-5 (*)    Bacteria, UA FEW (*)    All other components within normal limits  BASIC METABOLIC PANEL - Abnormal; Notable for the following:     Glucose, Bld 108 (*)    All other components within normal limits  MRSA PCR SCREENING  LIPASE, BLOOD  CBC  I-STAT CG4 LACTIC ACID, ED    Imaging Review Ct Abdomen Pelvis W Contrast  05/13/2015  CLINICAL DATA:  Generalized abdominal pain with constipation EXAM: CT ABDOMEN AND PELVIS WITH CONTRAST TECHNIQUE: Multidetector CT imaging of the abdomen and pelvis was performed using the standard protocol following bolus administration of intravenous contrast. CONTRAST:  OMNIPAQUE IOHEXOL 300 MG/ML  SOLN COMPARISON:  July 28, 2011 FINDINGS: Lower chest: There is mild bibasilar atelectasis. Lung bases are otherwise clear. Hepatobiliary: No focal liver lesions are identified. The gallbladder wall is not appreciably thickened. There is no biliary duct dilatation. Pancreas: No  pancreatic mass or inflammatory focus is appreciable. Spleen: No splenic lesions are identified. Adrenals/Urinary Tract: Adrenals appear normal bilaterally. 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 There is no renal mass or hydronephrosis on either side. There is no renal or ureteral calculus on either side. Urinary bladder is midline with wall thickness within normal limits. Stomach/Bowel: There is diffuse stool throughout the entire colon. There is no appreciable colonic wall thickening. There is diffuse small bowel dilatation to the level of the mid to distal ileum consistent with a degree of bowel obstruction with transition zone in the mid to distal ileal region. There is no free air or portal venous air. Vascular/Lymphatic: There is no abdominal aortic aneurysm. The major mesenteric vessels are patent. There is no appreciable adenopathy in the abdomen or pelvis. Reproductive: There are prostatic calculi. Prostate is upper normal in size. The seminal vesicles appear unremarkable. Other: Appendix appears unremarkable. No abscess or ascites is appreciable in the abdomen or pelvis. Musculoskeletal: There is lumbar dextroscoliosis. No blastic or lytic  bone lesions are appreciable. No intramuscular lesions or abdominal wall lesions are appreciable. IMPRESSION: Small bowel obstruction with transition zone at mid to distal ileal level. No free air apparent. Diffuse stool throughout the colon consistent with constipation. No colonic wall thickening. Multiple prostatic calculi. Prostate upper normal in size. This finding may warrant correlation with PSA. 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 Electronically Signed   By: Bretta BangWilliam  Woodruff III M.D.   On: 05/13/2015 18:43   Dg Abd Portable 2v  05/14/2015  CLINICAL DATA:  Small-bowel obstruction. EXAM: PORTABLE ABDOMEN - 2 VIEW COMPARISON:  CT 05/13/2015.  Abdomen series 6 02/12/2015 . FINDINGS: Dilated loops of small bowel remain. The colon is nondistended. Stool is present in the colon. Findings consistent with partial small bowel obstruction. Small bowel distention has decreased slightly from prior CT of 05/13/2015. No free air identified. Degenerative changes lumbar spine and both hips . IMPRESSION: Persistent dilatation of small bowel normal colonic gas pattern in stool in the colon. Findings consistent with persist partial small-bowel obstruction . Small-bowel distention has decreased slightly from prior CT of 05/13/2015. No free air. Electronically Signed   By: Maisie Fushomas  Register   On: 05/14/2015 07:27   I have personally reviewed and evaluated these lab results as part of my medical decision-making.   EKG Interpretation None      MDM   Final diagnoses:  Small bowel obstruction (HCC)   Pt w/ extensive GI hx including previous SBO who p/w 2 days of abd pain, vomiting, and constipation similar to previous bowel obstruction. He was sent from PCP office today. On exam, he was chronically ill appearing but non-toxic and in NAD. VS normal. Diffuse abd tenderness, worst in midepigastrium but no peritonitis. Gave IVF, zofran, and morphine and obtained above labs and CT abd/pel.  Lactate normal, WBC 11.3, Cr normal, UA c/w dehydration. Ct showed SBO at mid to distal ileum. Discussed w/ general surgery, Dr. Gerrit FriendsGerkin, who [facility] and will evaluate in ED to likely admit. [facility].  I personally performed the services described in this documentation, which was scribed in my presence. The recorded information has been reviewed and is accurate.    Laurence Spatesachel Morgan Bernice Mcauliffe, MD 05/14/15 [phone]

## 2015-05-13 NOTE — Progress Notes (Signed)
Pre visit review using our clinic review tool, if applicable. No additional management support is needed unless otherwise documented below in the visit note. 

## 2015-05-13 NOTE — ED Notes (Signed)
Pt was seen by Dr Corinda GublerLebauer and sent here with c.o abdominal pain and constipation. Hx of bowel obstruction.

## 2015-05-13 NOTE — ED Notes (Signed)
Requested patient to undress and wipe down in wipes to prepare him in getting ready for surgery.

## 2015-05-14 ENCOUNTER — Inpatient Hospital Stay (HOSPITAL_COMMUNITY): Payer: Medicare Other

## 2015-05-14 LAB — MRSA PCR SCREENING: MRSA BY PCR: NEGATIVE

## 2015-05-14 MED ORDER — CETYLPYRIDINIUM CHLORIDE 0.05 % MT LIQD
7.0000 mL | Freq: Two times a day (BID) | OROMUCOSAL | Status: DC
  Administered 2015-05-14 – 2015-05-19 (×10): 7 mL via OROMUCOSAL

## 2015-05-14 MED ORDER — BISACODYL 10 MG RE SUPP
10.0000 mg | Freq: Once | RECTAL | Status: AC
  Administered 2015-05-14: 10 mg via RECTAL
  Filled 2015-05-14: qty 1

## 2015-05-14 MED ORDER — LIDOCAINE HCL 2 % EX GEL
1.0000 "application " | Freq: Once | CUTANEOUS | Status: DC
  Filled 2015-05-14: qty 5

## 2015-05-14 MED ORDER — CHLORHEXIDINE GLUCONATE 0.12 % MT SOLN
15.0000 mL | Freq: Two times a day (BID) | OROMUCOSAL | Status: DC
  Administered 2015-05-14 – 2015-05-19 (×11): 15 mL via OROMUCOSAL
  Filled 2015-05-14 (×14): qty 15

## 2015-05-14 MED ORDER — PHENOL 1.4 % MT LIQD
1.0000 | OROMUCOSAL | Status: DC | PRN
  Filled 2015-05-14: qty 177

## 2015-05-14 MED ORDER — MENTHOL 3 MG MT LOZG
1.0000 | LOZENGE | OROMUCOSAL | Status: DC | PRN
  Filled 2015-05-14: qty 9

## 2015-05-14 NOTE — Progress Notes (Signed)
Unable to place NGT  X 4 attempts by 2 RN'S. T Gerkin,MD  notified  And patient will be assessed in AM.

## 2015-05-14 NOTE — Progress Notes (Addendum)
Pt currently refusing NGT insertion attempt, pt educated and will continue to consider the insertion.  PA aware.  Will continue to monitor.

## 2015-05-14 NOTE — Progress Notes (Signed)
Patient ID: [name], male   DOB: Jul 10, 1955, 59 y.o.   MRN: [id number]    Subjective: Still nauseated at times.  Had a BM this am.  Still bloated and with pain  Objective: Vital signs in last 24 hours: Temp:  [98 F (36.7 C)-98.9 F (37.2 C)] 98.6 F (37 C) (12/20 0540) Pulse Rate:  [83-93] 92 (12/20 0540) Resp:  [18-20] 18 (12/20 0540) BP: (120-147)/(70-97) 126/70 mmHg (12/20 0540) SpO2:  [96 %-100 %] 97 % (12/20 0540) Weight:  [55.339 kg (122 lb)] 55.339 kg (122 lb) (12/19 1531) Last BM Date: 05/11/15  Intake/Output from previous day: 12/19 0701 - 12/20 0700 In: 591.7 [I.V.:591.7] Out: 350 [Urine:350] Intake/Output this shift: Total I/O In: 625 [I.V.:625] Out: -   PE: Abd: distended and tympanitic, few BS, tender to palpation, but no guarding Heart: regular  Lab Results:   Recent Labs  05/13/15 1650 05/13/15 2150  WBC 11.3* 9.7  HGB 14.0 13.8  HCT 42.3 42.0  PLT 251 240   BMET  Recent Labs  05/13/15 1650 05/13/15 2150  NA 136 142  K 3.3* 3.9  CL 97* 106  CO2 31 28  GLUCOSE 119* 108*  BUN 15 13  CREATININE 0.84 0.81  CALCIUM 9.2 9.4   PT/INR No results for input(s): LABPROT, INR in the last 72 hours. CMP     Component Value Date/Time   NA 142 05/13/2015 2150   K 3.9 05/13/2015 2150   CL 106 05/13/2015 2150   CO2 28 05/13/2015 2150   GLUCOSE 108* 05/13/2015 2150   BUN 13 05/13/2015 2150   CREATININE 0.81 05/13/2015 2150   CALCIUM 9.4 05/13/2015 2150   PROT 6.9 05/13/2015 1650   ALBUMIN 4.2 05/13/2015 1650   AST 19 05/13/2015 1650   ALT 11* 05/13/2015 1650   ALKPHOS 59 05/13/2015 1650   BILITOT 0.9 05/13/2015 1650   GFRNONAA >60 05/13/2015 2150   GFRAA >60 05/13/2015 2150   Lipase     Component Value Date/Time   LIPASE 23 05/13/2015 1650       Studies/Results: Ct Abdomen Pelvis W Contrast  05/13/2015  CLINICAL DATA:  Generalized abdominal pain with constipation EXAM: CT ABDOMEN AND PELVIS WITH CONTRAST TECHNIQUE:  Multidetector CT imaging of the abdomen and pelvis was performed using the standard protocol following bolus administration of intravenous contrast. CONTRAST:  OMNIPAQUE IOHEXOL 300 MG/ML  SOLN COMPARISON:  July 28, 2011 FINDINGS: Lower chest: There is mild bibasilar atelectasis. Lung bases are otherwise clear. Hepatobiliary: No focal liver lesions are identified. The gallbladder wall is not appreciably thickened. There is no biliary duct dilatation. Pancreas: No pancreatic mass or inflammatory focus is appreciable. Spleen: No splenic lesions are identified. Adrenals/Urinary Tract: Adrenals appear normal bilaterally. 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 There is no renal mass or hydronephrosis on either side. There is no renal or ureteral calculus on either side. Urinary bladder is midline with wall thickness within normal limits. Stomach/Bowel: There is diffuse stool throughout the entire colon. There is no appreciable colonic wall thickening. There is diffuse small bowel dilatation to the level of the mid to distal ileum consistent with a degree of bowel obstruction with transition zone in the mid to distal ileal region. There is no free air or portal venous air. Vascular/Lymphatic: There is no abdominal aortic aneurysm. The major mesenteric vessels are patent. There is no appreciable adenopathy in the abdomen or pelvis. Reproductive: There are prostatic calculi. Prostate is upper normal in size. The seminal vesicles appear  unremarkable. Other: Appendix appears unremarkable. No abscess or ascites is appreciable in the abdomen or pelvis. Musculoskeletal: There is lumbar dextroscoliosis. No blastic or lytic bone lesions are appreciable. No intramuscular lesions or abdominal wall lesions are appreciable. IMPRESSION: Small bowel obstruction with transition zone at mid to distal ileal level. No free air apparent. Diffuse stool throughout the colon consistent with constipation. No colonic wall thickening. Multiple prostatic  calculi. Prostate upper normal in size. This finding may warrant correlation with PSA. 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 Electronically Signed   By: Bretta BangWilliam  Woodruff III M.D.   On: 05/13/2015 18:43   Dg Abd Portable 2v  05/14/2015  CLINICAL DATA:  Small-bowel obstruction. EXAM: PORTABLE ABDOMEN - 2 VIEW COMPARISON:  CT 05/13/2015.  Abdomen series 6 02/12/2015 . FINDINGS: Dilated loops of small bowel remain. The colon is nondistended. Stool is present in the colon. Findings consistent with partial small bowel obstruction. Small bowel distention has decreased slightly from prior CT of 05/13/2015. No free air identified. Degenerative changes lumbar spine and both hips . IMPRESSION: Persistent dilatation of small bowel normal colonic gas pattern in stool in the colon. Findings consistent with persist partial small-bowel obstruction . Small-bowel distention has decreased slightly from prior CT of 05/13/2015. No free air. Electronically Signed   By: Maisie Fushomas  Register   On: 05/14/2015 07:27    Anti-infectives: Anti-infectives    None       Assessment/Plan  1. SBO -will have RN try an NGT again today.  Would really like to get this in and try SBO protocol to attempt resolution without surgical management. -he also is severely constipated.  Will give a suppository to try to get things moving as well to see if this helps some too. -repeat films in am  2. DVT Proph SCDs/Lovenox  LOS: 1 day    Kehinde Totzke E 05/14/2015, 11:37 AM Pager: [phone]

## 2015-05-15 MED ORDER — SORBITOL 70 % SOLN
960.0000 mL | TOPICAL_OIL | Freq: Once | ORAL | Status: AC
  Administered 2015-05-15: 960 mL via RECTAL
  Filled 2015-05-15: qty 240

## 2015-05-15 NOTE — Progress Notes (Signed)
Patient ID: [name], male   DOB: 09/21/1955, 59 y.o.   MRN: [id number]    Subjective: Pt says his nausea is better today.  Passing minimal flatus.  Still distended with some tenderness  Objective: Vital signs in last 24 hours: Temp:  [98.1 F (36.7 C)-99.2 F (37.3 C)] 99 F (37.2 C) (12/21 0518) Pulse Rate:  [80-85] 84 (12/21 0518) Resp:  [16-18] 16 (12/21 0518) BP: (107-121)/(68-79) 121/75 mmHg (12/21 0518) SpO2:  [97 %-100 %] 98 % (12/21 0518) Last BM Date: 05/14/15  Intake/Output from previous day: 12/20 0701 - 12/21 0700 In: 3126 [P.O.:1; I.V.:3125] Out: 500 [Urine:500] Intake/Output this shift:    PE: Abd: soft, but still distended, few BS, still mildly tender Heart: regular Lungs: CTAB  Lab Results:   Recent Labs  05/13/15 1650 05/13/15 2150  WBC 11.3* 9.7  HGB 14.0 13.8  HCT 42.3 42.0  PLT 251 240   BMET  Recent Labs  05/13/15 1650 05/13/15 2150  NA 136 142  K 3.3* 3.9  CL 97* 106  CO2 31 28  GLUCOSE 119* 108*  BUN 15 13  CREATININE 0.84 0.81  CALCIUM 9.2 9.4   PT/INR No results for input(s): LABPROT, INR in the last 72 hours. CMP     Component Value Date/Time   NA 142 05/13/2015 2150   K 3.9 05/13/2015 2150   CL 106 05/13/2015 2150   CO2 28 05/13/2015 2150   GLUCOSE 108* 05/13/2015 2150   BUN 13 05/13/2015 2150   CREATININE 0.81 05/13/2015 2150   CALCIUM 9.4 05/13/2015 2150   PROT 6.9 05/13/2015 1650   ALBUMIN 4.2 05/13/2015 1650   AST 19 05/13/2015 1650   ALT 11* 05/13/2015 1650   ALKPHOS 59 05/13/2015 1650   BILITOT 0.9 05/13/2015 1650   GFRNONAA >60 05/13/2015 2150   GFRAA >60 05/13/2015 2150   Lipase     Component Value Date/Time   LIPASE 23 05/13/2015 1650       Studies/Results: Ct Abdomen Pelvis W Contrast  05/13/2015  CLINICAL DATA:  Generalized abdominal pain with constipation EXAM: CT ABDOMEN AND PELVIS WITH CONTRAST TECHNIQUE: Multidetector CT imaging of the abdomen and pelvis was performed using the  standard protocol following bolus administration of intravenous contrast. CONTRAST:  100mL OMNIPAQUE IOHEXOL 300 MG/ML  SOLN COMPARISON:  July 28, 2011 FINDINGS: Lower chest: There is mild bibasilar atelectasis. Lung bases are otherwise clear. Hepatobiliary: No focal liver lesions are identified. The gallbladder wall is not appreciably thickened. There is no biliary duct dilatation. Pancreas: No pancreatic mass or inflammatory focus is appreciable. Spleen: No splenic lesions are identified. Adrenals/Urinary Tract: Adrenals appear normal bilaterally. 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 There is no renal mass or hydronephrosis on either side. There is no renal or ureteral calculus on either side. Urinary bladder is midline with wall thickness within normal limits. Stomach/Bowel: There is diffuse stool throughout the entire colon. There is no appreciable colonic wall thickening. There is diffuse small bowel dilatation to the level of the mid to distal ileum consistent with a degree of bowel obstruction with transition zone in the mid to distal ileal region. There is no free air or portal venous air. Vascular/Lymphatic: There is no abdominal aortic aneurysm. The major mesenteric vessels are patent. There is no appreciable adenopathy in the abdomen or pelvis. Reproductive: There are prostatic calculi. Prostate is upper normal in size. The seminal vesicles appear unremarkable. Other: Appendix appears unremarkable. No abscess or ascites is appreciable in the abdomen or pelvis.  Musculoskeletal: There is lumbar dextroscoliosis. No blastic or lytic bone lesions are appreciable. No intramuscular lesions or abdominal wall lesions are appreciable. IMPRESSION: Small bowel obstruction with transition zone at mid to distal ileal level. No free air apparent. Diffuse stool throughout the colon consistent with constipation. No colonic wall thickening. Multiple prostatic calculi. Prostate upper normal in size. This finding may warrant  correlation with PSA. 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 Electronically Signed   By: Bretta Bang III M.D.   On: 05/13/2015 18:43   Dg Abd Portable 2v  05/14/2015  CLINICAL DATA:  Small-bowel obstruction. EXAM: PORTABLE ABDOMEN - 2 VIEW COMPARISON:  CT 05/13/2015.  Abdomen series 6 02/12/2015 . FINDINGS: Dilated loops of small bowel remain. The colon is nondistended. Stool is present in the colon. Findings consistent with partial small bowel obstruction. Small bowel distention has decreased slightly from prior CT of 05/13/2015. No free air identified. Degenerative changes lumbar spine and both hips . IMPRESSION: Persistent dilatation of small bowel normal colonic gas pattern in stool in the colon. Findings consistent with persist partial small-bowel obstruction . Small-bowel distention has decreased slightly from prior CT of 05/13/2015. No free air. Electronically Signed   By: Maisie Fus  Register   On: 05/14/2015 07:27    Anti-infectives: Anti-infectives    None       Assessment/Plan  1. sbo -remains about the same.  Still refusing NGT -minimal results with suppository, will try SMOG enema today given extensive amount of stool in his colon.  This may help start moving some things along -repeat films in the am -remain NPO for now DVT proph -SCDs/Lovenox   LOS: 2 days    Tionne Dayhoff E 05/15/2015, 9:40 AM Pager: [phone]

## 2015-05-16 ENCOUNTER — Inpatient Hospital Stay (HOSPITAL_COMMUNITY): Payer: Medicare Other

## 2015-05-16 MED ORDER — SORBITOL 70 % SOLN
960.0000 mL | TOPICAL_OIL | Freq: Two times a day (BID) | ORAL | Status: AC
  Administered 2015-05-16: 960 mL via RECTAL
  Filled 2015-05-16 (×2): qty 240

## 2015-05-16 MED ORDER — POLYETHYLENE GLYCOL 3350 17 G PO PACK
17.0000 g | PACK | Freq: Every day | ORAL | Status: DC
  Administered 2015-05-16 – 2015-05-17 (×2): 17 g via ORAL
  Filled 2015-05-16 (×3): qty 1

## 2015-05-16 NOTE — Care Management Important Message (Signed)
Important Message  Patient Details  Name: Patrick BeringRandall N Schwalm MRN: [id number] Date of Birth: 10/30/1955   Medicare Important Message Given:  Yes    Haskell FlirtJamison, Lamyia Cdebaca 05/16/2015, 12:12 PMImportant Message  Patient Details  Name: Patrick BeringRandall N Reveron MRN: [id number] Date of Birth: 05/18/1956   Medicare Important Message Given:  Yes    Haskell FlirtJamison, Robley Matassa 05/16/2015, 12:11 PM

## 2015-05-16 NOTE — Progress Notes (Signed)
Report given to International PaperKim Osbourne 3 west

## 2015-05-16 NOTE — Progress Notes (Signed)
Patient ID: [name], male   DOB: 10/16/1955, 59 y.o.   MRN: [id number]    Subjective: Pt feels about the same.  Little less bloated.  Had 2 small BMs with enema yesterday.  No nausea  Objective: Vital signs in last 24 hours: Temp:  [98 F (36.7 C)-98.8 F (37.1 C)] 98.4 F (36.9 C) (12/22 0517) Pulse Rate:  [72-81] 75 (12/22 0517) Resp:  [18-20] 20 (12/22 0517) BP: (121-136)/(80-86) 121/80 mmHg (12/22 0517) SpO2:  [97 %-100 %] 97 % (12/22 0517) Last BM Date: 05/15/15 (per patient)  Intake/Output from previous day: 12/21 0701 - 12/22 0700 In: 2943.8 [I.V.:2943.8] Out: 1600 [Urine:1600] Intake/Output this shift:    PE: Abd: softer,but still with distention, less tender, few BS Heart: regular  Lab Results:   Recent Labs  05/13/15 1650 05/13/15 2150  WBC 11.3* 9.7  HGB 14.0 13.8  HCT 42.3 42.0  PLT 251 240   BMET  Recent Labs  05/13/15 1650 05/13/15 2150  NA 136 142  K 3.3* 3.9  CL 97* 106  CO2 31 28  GLUCOSE 119* 108*  BUN 15 13  CREATININE 0.84 0.81  CALCIUM 9.2 9.4   PT/INR No results for input(s): LABPROT, INR in the last 72 hours. CMP     Component Value Date/Time   NA 142 05/13/2015 2150   K 3.9 05/13/2015 2150   CL 106 05/13/2015 2150   CO2 28 05/13/2015 2150   GLUCOSE 108* 05/13/2015 2150   BUN 13 05/13/2015 2150   CREATININE 0.81 05/13/2015 2150   CALCIUM 9.4 05/13/2015 2150   PROT 6.9 05/13/2015 1650   ALBUMIN 4.2 05/13/2015 1650   AST 19 05/13/2015 1650   ALT 11* 05/13/2015 1650   ALKPHOS 59 05/13/2015 1650   BILITOT 0.9 05/13/2015 1650   GFRNONAA >60 05/13/2015 2150   GFRAA >60 05/13/2015 2150   Lipase     Component Value Date/Time   LIPASE 23 05/13/2015 1650       Studies/Results: Dg Abd 2 Views  05/16/2015  CLINICAL DATA:  Followup small bowel obstruction. Abdominal pain for 4 days. Constipation. EXAM: ABDOMEN - 2 VIEW COMPARISON:  05/14/2015 FINDINGS: Moderately dilated small bowel loops containing air-fluid  levels show no significant change. Large amount of stool again seen throughout the colon. No evidence of free intraperitoneal air. IMPRESSION: Moderate small bowel dilatation and air-fluid levels, without significant change. Large colonic stool burden again noted. Electronically Signed   By: Myles RosenthalJohn  Stahl M.D.   On: 05/16/2015 08:37    Anti-infectives: Anti-infectives    None       Assessment/Plan  1. sbo with constipation -still distended, but softer.   -will give more SMOG enemas today to continue working on large stool burden that may be contributing some to his distention and obstructive symptoms.  He is still at risk for adhesive disease from prior surgery, but he refuses an NGT -?try some clear liquids given at least partial obstruction in nature with no further nausea.  Will d/w Dr. Carolynne Edouardoth   LOS: 3 days    [name] E 05/16/2015, 9:22 AM Pager: [phone]

## 2015-05-17 MED ORDER — BISACODYL 10 MG RE SUPP
10.0000 mg | Freq: Two times a day (BID) | RECTAL | Status: DC
  Administered 2015-05-18 (×2): 10 mg via RECTAL
  Filled 2015-05-17 (×4): qty 1

## 2015-05-17 MED ORDER — MAGNESIUM CITRATE PO SOLN
0.5000 | Freq: Once | ORAL | Status: AC
  Administered 2015-05-17: 0.5 via ORAL
  Filled 2015-05-17 (×2): qty 296

## 2015-05-17 NOTE — Care Management Note (Signed)
Case Management Note  Patient Details  Name: [name] MRN: [id number] Date of Birth: 08/24/1955  Subjective/Objective: 59 y/o m admitted w/abd pain/distention. From home.                   Action/Plan:d/c plan home.   Expected Discharge Date:                  Expected Discharge Plan:  Home/Self Care  In-House Referral:     Discharge planning Services  CM Consult  Post Acute Care Choice:    Choice offered to:     DME Arranged:    DME Agency:     HH Arranged:    HH Agency:     Status of Service:  In process, will continue to follow  Medicare Important Message Given:  Yes Date Medicare IM Given:    Medicare IM give by:    Date Additional Medicare IM Given:    Additional Medicare Important Message give by:     If discussed at Long Length of Stay Meetings, dates discussed:    Additional Comments:  Lanier ClamMahabir, Xenia Nile, RN 05/17/2015, 2:05 PM

## 2015-05-17 NOTE — Progress Notes (Signed)
Patient upset and refusing medications magnesium citrate, miralax, and dulcolax suppository.  Patient reporting that he was told that he was to take the medication after he had his abdominal xray completed.  Calls to xray confirmed the note by PA that patient was to take medication and have xray completed in AM for results of medication.  Patient continues to refuse after being relayed information from note.

## 2015-05-17 NOTE — Progress Notes (Signed)
Patient ID: [name], male   DOB: 02/25/1956, 59 y.o.   MRN: [id number]    Subjective: Pt feels about the same.  Had more small results only with his enemas  Objective: Vital signs in last 24 hours: Temp:  [97.6 F (36.4 C)-98.7 F (37.1 C)] 98.7 F (37.1 C) (12/23 0600) Pulse Rate:  [69-77] 77 (12/23 0600) Resp:  [16-20] 16 (12/23 0600) BP: (105-137)/(63-85) 137/85 mmHg (12/23 0600) SpO2:  [98 %-100 %] 99 % (12/23 0600) Last BM Date: 05/15/15  Intake/Output from previous day: 12/22 0701 - 12/23 0700 In: -  Out: 600 [Urine:600] Intake/Output this shift: Total I/O In: -  Out: 425 [Urine:425]  PE: Abd: softer, less distended, +BS, minimally tender Heart: regular LungS: CTAB  Lab Results:  No results for input(s): WBC, HGB, HCT, PLT in the last 72 hours. BMET No results for input(s): NA, K, CL, CO2, GLUCOSE, BUN, CREATININE, CALCIUM in the last 72 hours. PT/INR No results for input(s): LABPROT, INR in the last 72 hours. CMP     Component Value Date/Time   NA 142 05/13/2015 2150   K 3.9 05/13/2015 2150   CL 106 05/13/2015 2150   CO2 28 05/13/2015 2150   GLUCOSE 108* 05/13/2015 2150   BUN 13 05/13/2015 2150   CREATININE 0.81 05/13/2015 2150   CALCIUM 9.4 05/13/2015 2150   PROT 6.9 05/13/2015 1650   ALBUMIN 4.2 05/13/2015 1650   AST 19 05/13/2015 1650   ALT 11* 05/13/2015 1650   ALKPHOS 59 05/13/2015 1650   BILITOT 0.9 05/13/2015 1650   GFRNONAA >60 05/13/2015 2150   GFRAA >60 05/13/2015 2150   Lipase     Component Value Date/Time   LIPASE 23 05/13/2015 1650       Studies/Results: Dg Abd 2 Views  05/16/2015  CLINICAL DATA:  Followup small bowel obstruction. Abdominal pain for 4 days. Constipation. EXAM: ABDOMEN - 2 VIEW COMPARISON:  05/14/2015 FINDINGS: Moderately dilated small bowel loops containing air-fluid levels show no significant change. Large amount of stool again seen throughout the colon. No evidence of free intraperitoneal air. IMPRESSION:  Moderate small bowel dilatation and air-fluid levels, without significant change. Large colonic stool burden again noted. Electronically Signed   By: Myles RosenthalJohn  Stahl M.D.   On: 05/16/2015 08:37    Anti-infectives: Anti-infectives    None       Assessment/Plan  1. Abdominal distention -?PSBO vs constipation as source of distention -his colon is full of stool.  Cont suppositories, miralax, and half bottle of mag citrate to try and move things through -repeat films in the am to see what his distention looks like -cont sips of clears from the floor. DVT proph scds/lovenox   LOS: 4 days    [name] 05/17/2015, 9:38 AM Pager: [phone]

## 2015-05-18 ENCOUNTER — Inpatient Hospital Stay (HOSPITAL_COMMUNITY): Payer: Medicare Other

## 2015-05-18 NOTE — Progress Notes (Signed)
  Subjective: PT with no BM, min flatus KUB con't to show stool load and dilated SB   Objective: Vital signs in last 24 hours: Temp:  [98.4 F (36.9 C)-98.6 F (37 C)] 98.4 F (36.9 C) (12/24 0603) Pulse Rate:  [73-87] 79 (12/24 0603) Resp:  [16-20] 17 (12/24 0603) BP: (116-132)/(69-90) 132/86 mmHg (12/24 0603) SpO2:  [99 %-100 %] 100 % (12/24 0603) Last BM Date: 05/15/15  Intake/Output from previous day: 12/23 0701 - 12/24 0700 In: -  Out: 1800 [Urine:1800] Intake/Output this shift:    General appearance: alert and cooperative GI: soft, distended, non ttp  Lab Results:  No results for input(s): WBC, HGB, HCT, PLT in the last 72 hours. BMET No results for input(s): NA, K, CL, CO2, GLUCOSE, BUN, CREATININE, CALCIUM in the last 72 hours. PT/INR No results for input(s): LABPROT, INR in the last 72 hours. ABG No results for input(s): PHART, HCO3 in the last 72 hours.  Invalid input(s): PCO2, PO2  Studies/Results: Dg Abd 2 Views  05/16/2015  CLINICAL DATA:  Followup small bowel obstruction. Abdominal pain for 4 days. Constipation. EXAM: ABDOMEN - 2 VIEW COMPARISON:  05/14/2015 FINDINGS: Moderately dilated small bowel loops containing air-fluid levels show no significant change. Large amount of stool again seen throughout the colon. No evidence of free intraperitoneal air. IMPRESSION: Moderate small bowel dilatation and air-fluid levels, without significant change. Large colonic stool burden again noted. Electronically Signed   By: Myles RosenthalJohn  Stahl M.D.   On: 05/16/2015 08:37    Anti-infectives: Anti-infectives    None      Assessment/Plan: 1. Abdominal distention -?PSBO vs constipation as source of distention -his colon is full of stool. Cont suppositories, miralax, and half bottle of mag citrate to today, since didn't take yesterday.  If non productive will obtain SBFT -cont sips of clears from the floor. DVT proph scds/lovenox   LOS: 5 days    Marigene EhlersRamirez Jr.,  [facility] 05/18/2015

## 2015-05-19 ENCOUNTER — Inpatient Hospital Stay (HOSPITAL_COMMUNITY): Payer: Medicare Other

## 2015-05-19 MED ORDER — DIATRIZOATE MEGLUMINE & SODIUM 66-10 % PO SOLN
90.0000 mL | Freq: Once | ORAL | Status: AC
  Administered 2015-05-19: 90 mL via NASOGASTRIC
  Filled 2015-05-19: qty 90

## 2015-05-19 NOTE — Progress Notes (Signed)
  Subjective: Pt with small BM yesterday and con't with flatus. Had some n/v with mag citrate.  Objective: Vital signs in last 24 hours: Temp:  [97.8 F (36.6 C)-98.5 F (36.9 C)] 98 F (36.7 C) (12/25 0545) Pulse Rate:  [72-87] 82 (12/25 0545) Resp:  [16-18] 16 (12/25 0545) BP: (112-120)/(71-75) 119/71 mmHg (12/25 0545) SpO2:  [99 %-100 %] 100 % (12/25 0545) Last BM Date: 05/15/15 (taking suppositories, mirlax)  Intake/Output from previous day: 12/24 0701 - 12/25 0700 In: -  Out: 200 [Urine:200] Intake/Output this shift:    General appearance: alert and cooperative GI: soft, min distension  Lab Results:  No results for input(s): WBC, HGB, HCT, PLT in the last 72 hours. BMET No results for input(s): NA, K, CL, CO2, GLUCOSE, BUN, CREATININE, CALCIUM in the last 72 hours. PT/INR No results for input(s): LABPROT, INR in the last 72 hours. ABG No results for input(s): PHART, HCO3 in the last 72 hours.  Invalid input(s): PCO2, PO2  Studies/Results: Dg Abd 2 Views  05/18/2015  CLINICAL DATA:  Abdominal distention abdominal pain for 3 days. EXAM: ABDOMEN - 2 VIEW COMPARISON:  05/16/2015 FINDINGS: There are gas-filled loops of small bowel which are mildly dilated up to 3 cm. There are multiple short air-fluid levels within the small bowel. These findings are similar to comparison exam. There is stool in the ascending colon and transverse colon. Small amount of gas in the rectum. Upright exam demonstrates no intraperitoneal free air. IMPRESSION: Small bowel bowel obstructive pattern without change. Gas and stool throughout the colon with gas in the rectum. No interval change. Electronically Signed   By: Genevive BiStewart  Edmunds M.D.   On: 05/18/2015 09:26    Anti-infectives: Anti-infectives    None      Assessment/Plan: 1. Abdominal distention -?PSBO vs constipation as source of distention -Will obtain SBFT -cont sips of clears from the floor. DVT proph scds/lovenox    LOS:  6 days    Marigene Ehlersamirez Jr., [facility] 05/19/2015

## 2015-05-20 LAB — CREATININE, SERUM: CREATININE: 0.88 mg/dL (ref 0.61–1.24)

## 2015-05-20 NOTE — Discharge Instructions (Signed)
Polyethylene Glycol powder  What is this medicine?  POLYETHYLENE GLYCOL 3350 (pol ee ETH i leen; GLYE col) powder is a laxative used to treat constipation. It increases the amount of water in the stool. Bowel movements become easier and more frequent.  This medicine may be used for other purposes; ask your health care provider or pharmacist if you have questions.  What should I tell my health care provider before I take this medicine?  They need to know if you have any of these conditions:  -a history of blockage of the stomach or intestine  -current abdomen distension or pain  -difficulty swallowing  -diverticulitis, ulcerative colitis, or other chronic bowel disease  -phenylketonuria  -an unusual or allergic reaction to polyethylene glycol, other medicines, dyes, or preservatives  -pregnant or trying to get pregnant  -breast-feeding  How should I use this medicine?  Take this medicine by mouth. The bottle has a measuring cap that is marked with a line. Pour the powder into the cap up to the marked line (the dose is about 1 heaping tablespoon). Add the powder in the cap to a full glass (4 to 8 ounces or 120 to 240 ml) of water, juice, soda, coffee or tea. Mix the powder well. Drink the solution. Take exactly as directed. Do not take your medicine more often than directed.  Talk to your pediatrician regarding the use of this medicine in children. Special care may be needed.  Overdosage: If you think you have taken too much of this medicine contact a poison control center or emergency room at once.  NOTE: This medicine is only for you. Do not share this medicine with others.  What if I miss a dose?  If you miss a dose, take it as soon as you can. If it is almost time for your next dose, take only that dose. Do not take double or extra doses.  What may interact with this medicine?  Interactions are not expected.  This list may not describe all possible interactions. Give your health care provider a list of all the  medicines, herbs, non-prescription drugs, or dietary supplements you use. Also tell them if you smoke, drink alcohol, or use illegal drugs. Some items may interact with your medicine.  What should I watch for while using this medicine?  Do not use for more than 2 weeks without advice from your doctor or health care professional. It can take 2 to 4 days to have a bowel movement and to experience improvement in constipation. See your health care professional for any changes in bowel habits, including constipation, that are severe or last longer than three weeks.  Always take this medicine with plenty of water.  What side effects may I notice from receiving this medicine?  Side effects that you should report to your doctor or health care professional as soon as possible:  -diarrhea  -difficulty breathing  -itching of the skin, hives, or skin rash  -severe bloating, pain, or distension of the stomach  -vomiting  Side effects that usually do not require medical attention (report to your doctor or health care professional if they continue or are bothersome):  -bloating or gas  -lower abdominal discomfort or cramps  -nausea  This list may not describe all possible side effects. Call your doctor for medical advice about side effects. You may report side effects to FDA at 1-800-FDA-1088.  Where should I keep my medicine?  Keep out of the reach of children.  Store between   15 and 30 degrees C (59 and 86 degrees F). Throw away any unused medicine after the expiration date.  NOTE: This sheet is a summary. It may not cover all possible information. If you have questions about this medicine, talk to your doctor, pharmacist, or health care provider.      2016, [employer]vier/Gold Standard. (2007-12-12 16:50:45)

## 2015-05-20 NOTE — Progress Notes (Signed)
  Subjective: Pt with multp BMs yesterday.  Feels great today  Objective: Vital signs in last 24 hours: Temp:  [98.7 F (37.1 C)-99.5 F (37.5 C)] 98.7 F (37.1 C) (12/26 0449) Pulse Rate:  [61-80] 61 (12/26 0449) Resp:  [16] 16 (12/26 0449) BP: (100-123)/(62-77) 100/64 mmHg (12/26 0449) SpO2:  [97 %-99 %] 97 % (12/26 0449) Last BM Date: 05/19/15  Intake/Output from previous day: 12/25 0701 - 12/26 0700 In: 3087.8 [P.O.:120; I.V.:2967.8] Out: 800 [Urine:800] Intake/Output this shift:    General appearance: alert and cooperative GI: soft, non-tender; bowel sounds normal; no masses,  no organomegaly  Lab Results:  No results for input(s): WBC, HGB, HCT, PLT in the last 72 hours. BMET  Recent Labs  05/20/15 0427  CREATININE 0.88   PT/INR No results for input(s): LABPROT, INR in the last 72 hours. ABG No results for input(s): PHART, HCO3 in the last 72 hours.  Invalid input(s): PCO2, PO2  Studies/Results: Dg Abd 2 Views  05/18/2015  CLINICAL DATA:  Abdominal distention abdominal pain for 3 days. EXAM: ABDOMEN - 2 VIEW COMPARISON:  05/16/2015 FINDINGS: There are gas-filled loops of small bowel which are mildly dilated up to 3 cm. There are multiple short air-fluid levels within the small bowel. These findings are similar to comparison exam. There is stool in the ascending colon and transverse colon. Small amount of gas in the rectum. Upright exam demonstrates no intraperitoneal free air. IMPRESSION: Small bowel bowel obstructive pattern without change. Gas and stool throughout the colon with gas in the rectum. No interval change. Electronically Signed   By: Genevive BiStewart  Edmunds M.D.   On: 05/18/2015 09:26   Dg Abd Portable 1v-small Bowel Obstruction Protocol-initial, 8 Hr Delay  05/19/2015  CLINICAL DATA:  59 year old male with small bowel obstruction follow-up. EXAM: PORTABLE ABDOMEN - 1 VIEW COMPARISON:  Radiograph dated 05/18/2015 and CT dated 05/13/15 FINDINGS: Diffuse  lead dilated air-filled loops of small bowel again noted. There is air within the colon. There is dense stool within the colon. Oral contrast mixed with stool noted in the left upper abdomen possibly within the colon. There is dense stool within the rectum. High-density content in the right upper abdomen may be mixed of oral contrast and stool within the right colon. IMPRESSION: Dense stool throughout the colon and rectum. There is air noted within the colon. Findings may represent constipation with fecal impaction within the rectum. Air-fluid dilated loops of small bowel may represent the obstruction versus ileus. Follow-up recommended. Electronically Signed   By: Elgie CollardArash  Radparvar M.D.   On: 05/19/2015 21:32    Anti-infectives: Anti-infectives    None      Assessment/Plan: Ileus vs constipation Advance diet to FLD Home later today if tol PO  LOS: 7 days    Marigene Ehlersamirez Jr., [facility] 05/20/2015

## 2015-05-20 NOTE — Plan of Care (Signed)
Problem: Nutrition: Goal: Adequate nutrition will be maintained Outcome: Progressing Patient given ginger ale and patient able to drink it without difficulty

## 2015-05-20 NOTE — Discharge Summary (Signed)
Physician Discharge Summary  Patient ID: [name] MRN: [id number] DOB/AGE: 58/10/1955 58 y.o.  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  He was made NPO.  He was not having n/v.  He was tol some ice chips on the floor.  Pt undewent gastrograffin and post kub began having multp BMs with relief of abd distention.  He was tol PO well and deemed stable for DC.  He was encouraged to continue at home with Miralax  Consults: None  Significant Diagnostic Studies: kub-revealed constipation vs ileus  Treatments: IV hydration  Discharge Exam: Blood pressure 100/64, pulse 61, temperature 98.7 F (37.1 C), temperature source Oral, resp. rate 16, height 5\' 6"  (1.676 m), weight 55.339 kg (122 lb), SpO2 97 %. General appearance: alert and cooperative GI: soft, non-tender; bowel sounds normal; no masses,  no organomegaly  Disposition: 01-Home or Self Care  Discharge Instructions    Diet - low sodium heart healthy    Complete by:  As directed      Increase activity slowly    Complete by:  As directed             Medication List    TAKE these medications        ALPRAZolam 1 MG tablet  Commonly known as:  XANAX  0.5mg  oral daily as needed for anxiety     bismuth subsalicylate 262 MG chewable tablet  Commonly known as:  PEPTO BISMOL  Chew 524 mg by mouth as needed for indigestion.     multivitamin capsule  Take 1 capsule by mouth daily.     traMADol 50 MG tablet  Commonly known as:  ULTRAM  Take by mouth every 6 (six) hours as needed. Reported on 05/13/2015         Signed: Marigene EhlersRamirez Jr., Jed LimerickArmando 05/20/2015, 7:55 AM

## 2015-05-20 NOTE — Progress Notes (Signed)
Patient refused his lovenox and suppository tonight.

## 2015-05-20 NOTE — Progress Notes (Signed)
Patient reports having 4-5 watery stools in last 1.5 hours. Requesting antidiarrheal. Told patient that he could not have an antidiarrhea since we are trying to "get him cleaned out" and get his SBO resolved.

## 2015-05-20 NOTE — Progress Notes (Signed)
Patient is alert and oriented. VS stable. No s/s of acute distress. Tolerating Soft diet well, with no pain/discomfort or nausea/vomitting. Up ambulating independently in room and voiding. Patient refused miralax and suppository stating he had loose bowel movements last night. MD is aware. Per MD patient is to be discharged home. Reviewed discharge education, information and instructions. Patient states understanding and has no questions. IV removed and tolerated well. Discharged with family via nurse tech Hilda LiasMarie in wheelchair.

## 2015-05-20 NOTE — Care Management Note (Signed)
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                Action/Plan: Discharge planning, no HH needs identified  Expected Discharge Date:                  Expected Discharge Plan:  Home/Self Care  In-House Referral:  NA  Discharge planning Services  CM Consult  Post Acute Care Choice:  NA Choice offered to:  NA  DME Arranged:  N/A DME Agency:  NA  HH Arranged:  NA HH Agency:  NA  Status of Service:  Completed, signed off  Medicare Important Message Given:  Yes Date Medicare IM Given:    Medicare IM give by:    Date Additional Medicare IM Given:    Additional Medicare Important Message give by:     If discussed at Long Length of Stay Meetings, dates discussed:    Additional Comments:  Alexis Goodelleele, Maryori Weide K, RN 05/20/2015, 1:40 PM

## 2015-05-20 NOTE — Progress Notes (Signed)
Patient reports having a "medium" size stool.

## 2015-06-03 ENCOUNTER — Ambulatory Visit (INDEPENDENT_AMBULATORY_CARE_PROVIDER_SITE_OTHER): Payer: Medicare Other | Admitting: Medical

## 2015-06-03 ENCOUNTER — Encounter: Payer: Self-pay | Admitting: Medical

## 2015-06-03 VITALS — BP 100/70 | HR 71 | Temp 97.9°F | Ht 66.0 in | Wt 122.4 lb

## 2015-06-03 DIAGNOSIS — R197 Diarrhea, unspecified: Secondary | ICD-10-CM

## 2015-06-03 LAB — CBC WITH DIFFERENTIAL/PLATELET
BASOS PCT: 0.6 % (ref 0.0–3.0)
Basophils Absolute: 0 10*3/uL (ref 0.0–0.1)
EOS ABS: 0.2 10*3/uL (ref 0.0–0.7)
Eosinophils Relative: 2.9 % (ref 0.0–5.0)
HEMATOCRIT: 41 % (ref 39.0–52.0)
Hemoglobin: 13.4 g/dL (ref 13.0–17.0)
LYMPHS PCT: 14 % (ref 12.0–46.0)
Lymphs Abs: 0.9 10*3/uL (ref 0.7–4.0)
MCHC: 32.5 g/dL (ref 30.0–36.0)
MCV: 85.7 fl (ref 78.0–100.0)
Monocytes Absolute: 0.2 10*3/uL (ref 0.1–1.0)
Monocytes Relative: 3.7 % (ref 3.0–12.0)
NEUTROS ABS: 5.1 10*3/uL (ref 1.4–7.7)
Neutrophils Relative %: 78.8 % — ABNORMAL HIGH (ref 43.0–77.0)
PLATELETS: 311 10*3/uL (ref 150.0–400.0)
RBC: 4.79 Mil/uL (ref 4.22–5.81)
RDW: 15.1 % (ref 11.5–15.5)
WBC: 6.5 10*3/uL (ref 4.0–10.5)

## 2015-06-03 LAB — COMPREHENSIVE METABOLIC PANEL
ALT: 12 U/L (ref 0–53)
AST: 15 U/L (ref 0–37)
Albumin: 4.3 g/dL (ref 3.5–5.2)
Alkaline Phosphatase: 53 U/L (ref 39–117)
BUN: 12 mg/dL (ref 6–23)
CO2: 30 mEq/L (ref 19–32)
Calcium: 9.2 mg/dL (ref 8.4–10.5)
Chloride: 104 mEq/L (ref 96–112)
Creatinine, Ser: 0.9 mg/dL (ref 0.40–1.50)
GFR: 91.98 mL/min (ref >60.00)
Glucose, Bld: 86 mg/dL (ref 70–99)
Potassium: 3.8 mEq/L (ref 3.5–5.1)
Sodium: 139 mEq/L (ref 135–145)
Total Bilirubin: 0.3 mg/dL (ref 0.2–1.2)
Total Protein: 6.2 g/dL (ref 6.0–8.3)

## 2015-06-03 MED ORDER — DIPHENOXYLATE-ATROPINE 2.5-0.025 MG PO TABS: ORAL_TABLET | ORAL | Status: DC

## 2015-06-03 NOTE — Progress Notes (Signed)
Pre visit review using our clinic review tool, if applicable. No additional management support is needed unless otherwise documented below in the visit note. 

## 2015-06-03 NOTE — Progress Notes (Signed)
Subjective:    Patient ID: [name], male    DOB: Sep 11, 1955, 60 y.o.   MRN: [id number]  HPI   Pt in for follow up. He has been out of hospital for about a week. Pt has tried some immodium. Pt went in to hospital for concern for recurrent small obstruction. He was given iv hydration and some laxative per patients. Then eventually discharged. But shortly after hospitialization he began with loose watery stools.  Pt states he is having loose stools about 4-5 times a day(for about 2 wks). He states it is watery.   No nausea or vomiting. Pt denies any abdomen pain.  Pt was told to take miralax daily but he has not since made loose stools a lot worse. Pt no longer on peptobismol.    Review of Systems  Constitutional: Negative for fever, chills and fatigue.  Respiratory: Negative for cough, chest tightness, shortness of breath and wheezing.   Cardiovascular: Negative for chest pain and palpitations.  Gastrointestinal: Positive for diarrhea. Negative for nausea, vomiting, abdominal pain, constipation, blood in stool, anal bleeding and rectal pain.  Genitourinary: Negative for dysuria, frequency and hematuria.  Musculoskeletal: Negative for back pain.  Skin: Negative for pallor and rash.  Neurological: Negative for dizziness and light-headedness.  Hematological: Negative for adenopathy. Does not bruise/bleed easily.  Psychiatric/Behavioral: Negative for behavioral problems and confusion.    Past Medical History  Diagnosis Date  . H/O: GI bleed   . Idiopathic scoliosis   . Colonic inertia     with chronic lifelong costipation  . Congenital fusion of cervical spine   . 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 Anxiety and depression 03/14/2010  . FECAL INCONTINENCE 08/19/2007  . Hydrocephalus --per CT 04/2014 07/23/2014  . Anxiety   . Depression     Social History   Social History  . Marital Status: Single    Spouse Name: N/A  . Number of Children: 0  . Years of Education: N/A    Occupational History  . 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 Smoking status: Never Smoker   . Smokeless tobacco: Never Used  . Alcohol Use: No     Comment: socially   . Drug Use: No  . Sexual Activity: No   Other Topics Concern  . Not on file   Social History Narrative   Lives w/ mother , has a younger brother and sister     Past Surgical History  Procedure Laterality Date  . Left knee surgery  1994, 1992  . Hernia repair  1980    left inguinal hernia  . Surgery for imperforate anus  1958  . Upper gastrointestinal endoscopy  05/15/03  . Colonoscopy  11/08/2006  . Laparotomy  07/18/2011    Procedure: EXPLORATORY LAPAROTOMY;  Surgeon: Mariella Saa, MD;  Location: [facility];  Service: General;  Laterality: N/A;  lysis of adhesions entero enterostomy  . Laparotomy  07/28/2011    Procedure: EXPLORATORY LAPAROTOMY;  Surgeon: Mariella Saa, MD;  Location: [facility];  Service: General;  Laterality: N/A;  . Orif patella Right 12/14/2014    Procedure: OPEN REDUCTION INTERNAL (ORIF) FIXATION PATELLA;  Surgeon: Ollen Gross, MD;  Location: [facility];  Service: Orthopedics;  Laterality: Right;    Family History  Problem Relation Age of Onset  . Alzheimer's disease Father   . Diabetes Mother   . Colon cancer Neg Hx   . Prostate cancer Neg Hx  No Known Allergies  Current Outpatient Prescriptions on File Prior to Visit  Medication Sig Dispense Refill  . ALPRAZolam (XANAX) 1 MG tablet 0.5mg  oral daily as needed for anxiety  0   No current facility-administered medications on file prior to visit.    BP 100/70 mmHg  Pulse 71  Temp(Src) 97.9 F (36.6 C) (Oral)  Ht 5\' 6"  (1.676 m)  Wt 122 lb 6.4 oz (55.52 kg)  BMI 19.77 kg/m2  SpO2 96%       Objective:   Physical Exam  General Appearance- Not in acute distress.  HEENT Eyes- Scleraeral/Conjuntiva-bilat- Not Yellow. Mouth & Throat- Normal.  Chest and Lung  Exam Auscultation: Breath sounds:-Normal. Adventitious sounds:- No Adventitious sounds.  Cardiovascular Auscultation:Rythm - Regular. Heart Sounds -Normal heart sounds.  Abdomen Inspection:-Inspection Normal.  Palpation/Perucssion: Palpation and Percussion of the abdomen reveal- Non Tender, No Rebound tenderness, No rigidity(Guarding) and No Palpable abdominal masses.  Liver:-Normal.  Spleen:- Normal.   Back- no cva tenderness  Skin- no tenting. Does not feel dry.      Assessment & Plan:  For you loose stools will get cbc, cmp, abd xray and stool studies.   Hydrate well and bland diet guidelines.  Will rx very limited day of lomotil since you have history of sbo and hx of surgery. We need to get studies and see if you have infection such as cdif in light of recent hosptilalization. Please take hand written order(to  [facility] main hospital lab) and turn in stool sample for c dif testing today.  If with lomotil you start to have obstrucution type symptoms notify ans stop lomotil.  Follow up Friday am or as needed.

## 2015-06-03 NOTE — Patient Instructions (Addendum)
For you loose stools will get cbc, cmp, abd xray and stool studies.   Hydrate well and bland diet guidelines.  Will rx very limited day of lomotil since you have history of sbo and hx of surgery. We need to get studies and see if you have infection such as cdif in light of recent hosptilalization. Please take hand written order(to [facility] main [facility])and turn in stool sample for c dif testing today.  If with lomotil you start to have obstrucution type symptoms notify ans stop lomotil.  Follow up Friday am or as needed.

## 2015-06-04 ENCOUNTER — Ambulatory Visit (HOSPITAL_BASED_OUTPATIENT_CLINIC_OR_DEPARTMENT_OTHER)
Admission: RE | Admit: 2015-06-04 | Discharge: 2015-06-04 | Disposition: A | Discharge status: Home / Self Care | Payer: Medicare Other | Source: Ambulatory Visit | Attending: Medical | Admitting: Medical

## 2015-06-04 ENCOUNTER — Telehealth: Payer: Self-pay | Admitting: Medical

## 2015-06-04 DIAGNOSIS — R197 Diarrhea, unspecified: Secondary | ICD-10-CM | POA: Diagnosis present

## 2015-06-04 NOTE — Telephone Encounter (Signed)
Did discuss with pt his xrays. Advised not to take the lomotil. Use dulcolax or miralax since it shows some constipation rather that diarrhea. He states he is no longer having any loose stools and did not turn in the c dificile test. Give us update on if feeling worse constipation or abdomen pain. Pt expressed understanding.

## 2015-06-05 ENCOUNTER — Other Ambulatory Visit (HOSPITAL_COMMUNITY)
Admission: RE | Admit: 2015-06-05 | Discharge: 2015-06-05 | Disposition: A | Discharge status: Home / Self Care | Payer: Medicare Other | Source: Ambulatory Visit | Attending: Family Medicine | Admitting: Family Medicine

## 2015-06-05 DIAGNOSIS — R197 Diarrhea, unspecified: Secondary | ICD-10-CM | POA: Insufficient documentation

## 2015-06-05 LAB — C DIFFICILE QUICK SCREEN W PCR REFLEX
C DIFFICILE (CDIFF) INTERP: NEGATIVE
C DIFFICILE (CDIFF) TOXIN: NEGATIVE
C Diff antigen: NEGATIVE

## 2015-06-06 ENCOUNTER — Telehealth: Payer: Self-pay | Admitting: Medical

## 2015-06-06 NOTE — Telephone Encounter (Signed)
Would you call [name] tomorrow first thing. Ask him to come in for exam. With his hx of obstruction, recent diarrhea and  recent xray the other day that favored constipation. Exam is needed before deciding on med to treat symptoms. If he can come in the morning this would be best. If can't come in morning then early afternoon. Wanted to talk with him early but Thursday our long day.

## 2015-06-06 NOTE — Telephone Encounter (Signed)
Relation to ZO:XWRUpt:self Call back number:[phone] Pharmacy: [facility] - [place], [place] - [id number]  Reason for call:  Patient requesting a refill diphenoxylate-atropine (LOMOTIL) 2.5-0.025 MG tablet, patient experiencing diarrhea

## 2015-06-06 NOTE — Telephone Encounter (Signed)
Spoke with pt and he states that he is having diarrhea 3 times a day and it started yesterday. Pt would like to know if he could have something called in to the pharmacy to help with this or does he need an office visit? Please advise.

## 2015-06-07 NOTE — Telephone Encounter (Signed)
Pt [facility]. Would you call them and see if that test has been done. What was the result if he dropped it off?

## 2015-06-07 NOTE — Telephone Encounter (Signed)
Pt is coming in for an appointment on the 16th. He was not available to come in for an appointment 06/07/15.

## 2015-06-10 ENCOUNTER — Telehealth: Payer: Self-pay | Admitting: Medical

## 2015-06-10 ENCOUNTER — Encounter: Payer: Medicare Other | Admitting: Medical

## 2015-06-10 NOTE — Progress Notes (Signed)
This encounter was created in error - please disregard.

## 2015-06-10 NOTE — Telephone Encounter (Signed)
[name]  Relationship to patient: Self  Can be reached: [phone]  Reason for call: Pt lvm on vm at 9:55 stating that he is having car trouble and is currently at the repair shop waiting.

## 2015-06-10 NOTE — Telephone Encounter (Signed)
cdiff was resulted on 06/05/15 .Marland Kitchen. Looks to be negative.

## 2015-06-10 NOTE — Telephone Encounter (Signed)
Please have the pt reschedule?

## 2015-06-11 NOTE — Telephone Encounter (Signed)
Called pt, he states that he will call us when ready to reschedule.

## 2015-06-12 NOTE — Telephone Encounter (Signed)
No charge this time. 

## 2015-06-12 NOTE — Telephone Encounter (Signed)
Charge or no charge? °

## 2015-06-13 DIAGNOSIS — B351 Tinea unguium: Secondary | ICD-10-CM | POA: Diagnosis not present

## 2015-06-13 DIAGNOSIS — M79674 Pain in right toe(s): Secondary | ICD-10-CM | POA: Diagnosis not present

## 2015-06-13 DIAGNOSIS — L03031 Cellulitis of right toe: Secondary | ICD-10-CM | POA: Diagnosis not present

## 2015-06-28 ENCOUNTER — Encounter: Payer: Self-pay | Admitting: Neurology

## 2015-06-28 ENCOUNTER — Ambulatory Visit (INDEPENDENT_AMBULATORY_CARE_PROVIDER_SITE_OTHER): Payer: Medicare Other | Admitting: Neurology

## 2015-06-28 VITALS — BP 100/70 | HR 64 | Ht 67.0 in | Wt 125.0 lb

## 2015-06-28 DIAGNOSIS — M25569 Pain in unspecified knee: Secondary | ICD-10-CM

## 2015-06-28 NOTE — Progress Notes (Signed)
No charge.  Patient came to the wrong physician for knee pain.

## 2015-09-18 DIAGNOSIS — S82044D Nondisplaced comminuted fracture of right patella, subsequent encounter for closed fracture with routine healing: Secondary | ICD-10-CM | POA: Diagnosis not present

## 2015-09-18 DIAGNOSIS — Z4789 Encounter for other orthopedic aftercare: Secondary | ICD-10-CM | POA: Diagnosis not present

## 2015-10-11 DIAGNOSIS — S82044D Nondisplaced comminuted fracture of right patella, subsequent encounter for closed fracture with routine healing: Secondary | ICD-10-CM | POA: Diagnosis not present

## 2015-10-11 DIAGNOSIS — Z4789 Encounter for other orthopedic aftercare: Secondary | ICD-10-CM | POA: Diagnosis not present

## 2015-10-15 ENCOUNTER — Encounter (HOSPITAL_COMMUNITY): Payer: Self-pay | Admitting: *Deleted

## 2015-10-17 ENCOUNTER — Ambulatory Visit: Payer: Self-pay | Admitting: Orthopedic Surgery

## 2015-10-22 DIAGNOSIS — T8484XA Pain due to internal orthopedic prosthetic devices, implants and grafts, initial encounter: Secondary | ICD-10-CM | POA: Diagnosis present

## 2015-10-22 NOTE — H&P (Signed)
  CC- [name] is a 60 y.o. male who presents with right knee pain.  HPI- . Knee Pain: Patient presents with knee pain involving the  right knee. Onset of the symptoms was several months ago. Inciting event: He had ORIF of a right patella fracture several months ago which healed unevenfully but he now has anterior knee pain related to the hardware. He presents now for hardware removal.. Current symptoms include pain located anteriorly. Pain is aggravated by rising after sitting, squatting and walking.  Patient has had prior knee problems. Evaluation to date: plain films: healed fracture with intact hardware.   Past Medical History  Diagnosis Date  . H/O: GI bleed   . Idiopathic scoliosis   . Colonic inertia     with chronic lifelong costipation  . Congenital fusion of cervical spine   . 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 Anxiety and depression 03/14/2010  . FECAL INCONTINENCE 08/19/2007  . Hydrocephalus --per CT 04/2014 07/23/2014  . Anxiety   . Depression   . Heart murmur     early childhood   . Pneumonia     childhood     Past Surgical History  Procedure Laterality Date  . Left knee surgery  1994, 1992  . Hernia repair  1980    left inguinal hernia  . Surgery for imperforate anus  1958  . Upper gastrointestinal endoscopy  05/15/03  . Colonoscopy  11/08/2006  . Laparotomy  07/18/2011    Procedure: EXPLORATORY LAPAROTOMY;  Surgeon: Mariella SaaBenjamin T Hoxworth, MD;  Location: [facility];  Service: General;  Laterality: N/A;  lysis of adhesions entero enterostomy  . Laparotomy  07/28/2011    Procedure: EXPLORATORY LAPAROTOMY;  Surgeon: Mariella SaaBenjamin T Hoxworth, MD;  Location: [facility];  Service: General;  Laterality: N/A;  . Orif patella Right 12/14/2014    Procedure: OPEN REDUCTION INTERNAL (ORIF) FIXATION PATELLA;  Surgeon: Ollen GrossFrank Alexio Sroka, MD;  Location: [facility];  Service: Orthopedics;  Laterality: Right;  . Tonsillectomy      Prior to Admission medications   Medication Sig Start Date End Date Taking?  Authorizing Provider  traMADol (ULTRAM) 50 MG tablet Take 50 mg by mouth every 6 (six) hours. 10/04/15  Yes Historical Provider, MD   KNEE EXAM antalgic gait, soft tissue tenderness over anterior knee with palpable hardware, no effusion  Physical Examination: General appearance - alert, well appearing, and in no distress Mental status - alert, oriented to person, place, and time Chest - clear to auscultation, no wheezes, rales or rhonchi, symmetric air entry Heart - normal rate, regular rhythm, normal S1, S2, no murmurs, rubs, clicks or gallops Abdomen - soft, nontender, nondistended, no masses or organomegaly Neurological - alert, oriented, normal speech, no focal findings or movement disorder noted   Asessment/Plan--- Painful hardware right knee- - Plan hardware removal right knee. Procedure risks and potential comps discussed with patient who elects to proceed. Goals are decreased pain and increased function with a high likelihood of achieving both

## 2015-10-23 ENCOUNTER — Ambulatory Visit (HOSPITAL_COMMUNITY): Payer: Medicare Other | Admitting: Certified Registered Nurse Anesthetist

## 2015-10-23 ENCOUNTER — Encounter (HOSPITAL_COMMUNITY): Payer: Self-pay | Admitting: *Deleted

## 2015-10-23 ENCOUNTER — Ambulatory Visit (HOSPITAL_COMMUNITY)
Admission: RE | Admit: 2015-10-23 | Discharge: 2015-10-23 | Disposition: A | Discharge status: Home / Self Care | Payer: Medicare Other | Source: Ambulatory Visit | Attending: Orthopedic Surgery | Admitting: Orthopedic Surgery

## 2015-10-23 ENCOUNTER — Encounter (HOSPITAL_COMMUNITY)
Admission: RE | Disposition: A | Discharge status: Home / Self Care | Payer: Self-pay | Source: Ambulatory Visit | Attending: Orthopedic Surgery

## 2015-10-23 DIAGNOSIS — M419 Scoliosis, unspecified: Secondary | ICD-10-CM | POA: Diagnosis not present

## 2015-10-23 DIAGNOSIS — T8484XA Pain due to internal orthopedic prosthetic devices, implants and grafts, initial encounter: Secondary | ICD-10-CM | POA: Diagnosis present

## 2015-10-23 DIAGNOSIS — X58XXXA Exposure to other specified factors, initial encounter: Secondary | ICD-10-CM | POA: Insufficient documentation

## 2015-10-23 DIAGNOSIS — M25561 Pain in right knee: Secondary | ICD-10-CM | POA: Diagnosis present

## 2015-10-23 HISTORY — PX: HARDWARE REMOVAL: SHX979

## 2015-10-23 HISTORY — DX: Cardiac murmur, unspecified: R01.1

## 2015-10-23 HISTORY — DX: Pneumonia, unspecified organism: J18.9

## 2015-10-23 LAB — CBC
HEMATOCRIT: 41.6 % (ref 39.0–52.0)
Hemoglobin: 13.7 g/dL (ref 13.0–17.0)
MCH: 28 pg (ref 26.0–34.0)
MCHC: 32.9 g/dL (ref 30.0–36.0)
MCV: 85.1 fL (ref 78.0–100.0)
PLATELETS: 244 10*3/uL (ref 150–400)
RBC: 4.89 MIL/uL (ref 4.22–5.81)
RDW: 13.9 % (ref 11.5–15.5)
WBC: 4.9 10*3/uL (ref 4.0–10.5)

## 2015-10-23 LAB — SURGICAL PCR SCREEN
MRSA, PCR: NEGATIVE
STAPHYLOCOCCUS AUREUS: NEGATIVE

## 2015-10-23 SURGERY — REMOVAL, HARDWARE
Anesthesia: General | Site: Knee | Laterality: Right

## 2015-10-23 MED ORDER — LIDOCAINE HCL (CARDIAC) 20 MG/ML IV SOLN
INTRAVENOUS | Status: DC | PRN
  Administered 2015-10-23: 75 mg via INTRAVENOUS

## 2015-10-23 MED ORDER — FENTANYL CITRATE (PF) 100 MCG/2ML IJ SOLN: 25.0000 ug | INTRAMUSCULAR | Status: DC | PRN

## 2015-10-23 MED ORDER — PROPOFOL 10 MG/ML IV BOLUS
INTRAVENOUS | Status: DC | PRN
  Administered 2015-10-23: 150 mg via INTRAVENOUS

## 2015-10-23 MED ORDER — DEXAMETHASONE SODIUM PHOSPHATE 10 MG/ML IJ SOLN
INTRAMUSCULAR | Status: DC | PRN
  Administered 2015-10-23: 10 mg via INTRAVENOUS

## 2015-10-23 MED ORDER — CEFAZOLIN SODIUM-DEXTROSE 2-4 GM/100ML-% IV SOLN
2.0000 g | INTRAVENOUS | Status: AC
  Administered 2015-10-23: 2 g via INTRAVENOUS

## 2015-10-23 MED ORDER — POVIDONE-IODINE 10 % EX SWAB: 2.0000 "application " | Freq: Once | CUTANEOUS | Status: DC

## 2015-10-23 MED ORDER — LACTATED RINGERS IV SOLN: INTRAVENOUS | Status: DC

## 2015-10-23 MED ORDER — LACTATED RINGERS IV SOLN
INTRAVENOUS | Status: DC | PRN
  Administered 2015-10-23 (×2): via INTRAVENOUS

## 2015-10-23 MED ORDER — ONDANSETRON HCL 4 MG/2ML IJ SOLN
INTRAMUSCULAR | Status: DC | PRN
  Administered 2015-10-23 (×2): 2 mg via INTRAVENOUS

## 2015-10-23 MED ORDER — METOCLOPRAMIDE HCL 5 MG/ML IJ SOLN
INTRAMUSCULAR | Status: DC | PRN
  Administered 2015-10-23: 10 mg via INTRAVENOUS

## 2015-10-23 MED ORDER — FENTANYL CITRATE (PF) 100 MCG/2ML IJ SOLN
INTRAMUSCULAR | Status: DC | PRN
  Administered 2015-10-23 (×2): 25 ug via INTRAVENOUS
  Administered 2015-10-23: 50 ug via INTRAVENOUS

## 2015-10-23 MED ORDER — BUPIVACAINE-EPINEPHRINE (PF) 0.25% -1:200000 IJ SOLN
INTRAMUSCULAR | Status: AC
  Filled 2015-10-23: qty 30

## 2015-10-23 MED ORDER — CHLORHEXIDINE GLUCONATE 4 % EX LIQD: 60.0000 mL | Freq: Once | CUTANEOUS | Status: DC

## 2015-10-23 MED ORDER — EPHEDRINE SULFATE 50 MG/ML IJ SOLN
INTRAMUSCULAR | Status: DC | PRN
  Administered 2015-10-23: 5 mg via INTRAVENOUS
  Administered 2015-10-23: 10 mg via INTRAVENOUS

## 2015-10-23 MED ORDER — ACETAMINOPHEN 10 MG/ML IV SOLN
1000.0000 mg | Freq: Once | INTRAVENOUS | Status: AC
  Administered 2015-10-23: 1000 mg via INTRAVENOUS
  Filled 2015-10-23: qty 100

## 2015-10-23 MED ORDER — SODIUM CHLORIDE 0.9 % IV SOLN: INTRAVENOUS | Status: DC

## 2015-10-23 MED ORDER — PROPOFOL 10 MG/ML IV BOLUS
INTRAVENOUS | Status: AC
  Filled 2015-10-23: qty 20

## 2015-10-23 MED ORDER — BUPIVACAINE-EPINEPHRINE 0.25% -1:200000 IJ SOLN
INTRAMUSCULAR | Status: DC | PRN
  Administered 2015-10-23: 30 mL

## 2015-10-23 MED ORDER — METOCLOPRAMIDE HCL 5 MG/ML IJ SOLN: 10.0000 mg | Freq: Once | INTRAMUSCULAR | Status: DC

## 2015-10-23 MED ORDER — MIDAZOLAM HCL 5 MG/5ML IJ SOLN
INTRAMUSCULAR | Status: DC | PRN
  Administered 2015-10-23 (×4): 0.5 mg via INTRAVENOUS

## 2015-10-23 MED ORDER — METOCLOPRAMIDE HCL 5 MG/ML IJ SOLN
10.0000 mg | Freq: Once | INTRAMUSCULAR | Status: AC
  Administered 2015-10-23: 10 mg via INTRAVENOUS

## 2015-10-23 MED ORDER — LIDOCAINE HCL (CARDIAC) 20 MG/ML IV SOLN
INTRAVENOUS | Status: AC
  Filled 2015-10-23: qty 5

## 2015-10-23 MED ORDER — MIDAZOLAM HCL 2 MG/2ML IJ SOLN
INTRAMUSCULAR | Status: AC
  Filled 2015-10-23: qty 2

## 2015-10-23 MED ORDER — METOCLOPRAMIDE HCL 5 MG/ML IJ SOLN
INTRAMUSCULAR | Status: AC
  Filled 2015-10-23: qty 2

## 2015-10-23 MED ORDER — ONDANSETRON HCL 4 MG/2ML IJ SOLN
INTRAMUSCULAR | Status: AC
  Filled 2015-10-23: qty 2

## 2015-10-23 MED ORDER — LACTATED RINGERS IV SOLN
INTRAVENOUS | Status: DC
  Administered 2015-10-23: 11:00:00 via INTRAVENOUS

## 2015-10-23 MED ORDER — DEXAMETHASONE SODIUM PHOSPHATE 10 MG/ML IJ SOLN: 10.0000 mg | Freq: Once | INTRAMUSCULAR | Status: DC

## 2015-10-23 MED ORDER — ACETAMINOPHEN 10 MG/ML IV SOLN
INTRAVENOUS | Status: AC
  Filled 2015-10-23: qty 100

## 2015-10-23 MED ORDER — FENTANYL CITRATE (PF) 100 MCG/2ML IJ SOLN
INTRAMUSCULAR | Status: AC
  Filled 2015-10-23: qty 2

## 2015-10-23 MED ORDER — DEXAMETHASONE SODIUM PHOSPHATE 10 MG/ML IJ SOLN
INTRAMUSCULAR | Status: AC
  Filled 2015-10-23: qty 1

## 2015-10-23 MED ORDER — CEFAZOLIN SODIUM-DEXTROSE 2-4 GM/100ML-% IV SOLN
INTRAVENOUS | Status: AC
  Filled 2015-10-23: qty 100

## 2015-10-23 MED ORDER — 0.9 % SODIUM CHLORIDE (POUR BTL) OPTIME
TOPICAL | Status: DC | PRN
  Administered 2015-10-23: 1000 mL

## 2015-10-23 SURGICAL SUPPLY — 38 items
BANDAGE ACE 6X5 VEL STRL LF (GAUZE/BANDAGES/DRESSINGS) ×3 IMPLANT
BANDAGE ESMARK 6X9 LF (GAUZE/BANDAGES/DRESSINGS) ×1 IMPLANT
BNDG ESMARK 6X9 LF (GAUZE/BANDAGES/DRESSINGS) ×3
CLOSURE WOUND 1/2 X4 (GAUZE/BANDAGES/DRESSINGS) ×1
CUFF TOURN SGL QUICK 34 (TOURNIQUET CUFF) ×2
CUFF TRNQT CYL 34X4X40X1 (TOURNIQUET CUFF) ×1 IMPLANT
DRAPE EXTREMITY T 121X128X90 (DRAPE) ×3 IMPLANT
DRAPE INCISE IOBAN 66X45 STRL (DRAPES) ×3 IMPLANT
DRAPE ORTHO SPLIT 77X108 STRL (DRAPES)
DRAPE SURG ORHT 6 SPLT 77X108 (DRAPES) IMPLANT
DRSG ADAPTIC 3X8 NADH LF (GAUZE/BANDAGES/DRESSINGS) ×3 IMPLANT
DRSG PAD ABDOMINAL 8X10 ST (GAUZE/BANDAGES/DRESSINGS) ×3 IMPLANT
DURAPREP 26ML APPLICATOR (WOUND CARE) ×3 IMPLANT
ELECT REM PT RETURN 9FT ADLT (ELECTROSURGICAL) ×3
ELECTRODE REM PT RTRN 9FT ADLT (ELECTROSURGICAL) ×1 IMPLANT
GAUZE SPONGE 4X4 12PLY STRL (GAUZE/BANDAGES/DRESSINGS) ×3 IMPLANT
GLOVE BIO SURGEON STRL SZ8 (GLOVE) ×6 IMPLANT
GLOVE BIOGEL PI IND STRL 6.5 (GLOVE) ×2 IMPLANT
GLOVE BIOGEL PI IND STRL 8 (GLOVE) ×3 IMPLANT
GLOVE BIOGEL PI INDICATOR 6.5 (GLOVE) ×4
GLOVE BIOGEL PI INDICATOR 8 (GLOVE) ×6
GOWN STRL REUS W/TWL LRG LVL3 (GOWN DISPOSABLE) ×3 IMPLANT
GOWN STRL REUS W/TWL XL LVL3 (GOWN DISPOSABLE) ×3 IMPLANT
KIT BASIN OR (CUSTOM PROCEDURE TRAY) ×3 IMPLANT
MANIFOLD NEPTUNE II (INSTRUMENTS) ×3 IMPLANT
PACK TOTAL JOINT (CUSTOM PROCEDURE TRAY) ×3 IMPLANT
PAD ABD 8X10 STRL (GAUZE/BANDAGES/DRESSINGS) ×3 IMPLANT
PADDING CAST COTTON 6X4 STRL (CAST SUPPLIES) ×6 IMPLANT
POSITIONER SURGICAL ARM (MISCELLANEOUS) ×3 IMPLANT
STAPLER VISISTAT 35W (STAPLE) IMPLANT
STRIP CLOSURE SKIN 1/2X4 (GAUZE/BANDAGES/DRESSINGS) ×2 IMPLANT
SUT MNCRL AB 4-0 PS2 18 (SUTURE) ×3 IMPLANT
SUT VIC AB 0 CT1 36 (SUTURE) ×6 IMPLANT
SUT VIC AB 2-0 CT1 27 (SUTURE) ×4
SUT VIC AB 2-0 CT1 TAPERPNT 27 (SUTURE) ×2 IMPLANT
TOWEL OR 17X26 10 PK STRL BLUE (TOWEL DISPOSABLE) ×6 IMPLANT
UNDERPAD 30X30 INCONTINENT (UNDERPADS AND DIAPERS) ×3 IMPLANT
WRAP KNEE MAXI GEL POST OP (GAUZE/BANDAGES/DRESSINGS) ×3 IMPLANT

## 2015-10-23 NOTE — Anesthesia Preprocedure Evaluation (Addendum)
Anesthesia Evaluation  Patient identified by MRN, date of birth, ID band Patient awake    Reviewed: Allergy & Precautions, H&P , NPO status , Patient's Chart, lab work & pertinent test results  Airway Mallampati: II  TM Distance: >3 FB Neck ROM: full    Dental  (+) Dental Advisory Given, Missing Many missing front teeth.:   Pulmonary shortness of breath and with exertion,    Pulmonary exam normal breath sounds clear to auscultation       Cardiovascular negative cardio ROS Normal cardiovascular exam Rhythm:regular Rate:Normal     Neuro/Psych Hydrocephalus per CT 2015. Fusion of cervical spine negative psych ROS   GI/Hepatic negative GI ROS, Neg liver ROS,   Endo/Other  negative endocrine ROS  Renal/GU negative Renal ROS  negative genitourinary   Musculoskeletal   Abdominal   Peds  Hematology negative hematology ROS (+)   Anesthesia Other Findings   Reproductive/Obstetrics negative OB ROS                            Anesthesia Physical Anesthesia Plan  ASA: III  Anesthesia Plan: General   Post-op Pain Management:    Induction: Intravenous  Airway Management Planned: LMA  Additional Equipment:   Intra-op Plan:   Post-operative Plan:   Informed Consent: I have reviewed the patients History and Physical, chart, labs and discussed the procedure including the risks, benefits and alternatives for the proposed anesthesia with the patient or authorized representative who has indicated his/her understanding and acceptance.   Dental Advisory Given  Plan Discussed with: CRNA  Anesthesia Plan Comments:         Anesthesia Quick Evaluation

## 2015-10-23 NOTE — Interval H&P Note (Signed)
History and Physical Interval Note:  10/23/2015 11:44 AM  [name]  has presented today for surgery, with the diagnosis of PAINFUL HARDWARE RIGHT KNEE   The various methods of treatment have been discussed with the patient and family. After consideration of risks, benefits and other options for treatment, the patient has consented to  Procedure(s): RIGHT KNEE HARDWARE REMOVAL (Right) as a surgical intervention .  The patient's history has been reviewed, patient examined, no change in status, stable for surgery.  I have reviewed the patient's chart and labs.  Questions were answered to the patient's satisfaction.     Loanne DrillingALUISIO,Dawnetta Copenhaver V

## 2015-10-23 NOTE — Anesthesia Procedure Notes (Signed)
Procedure Name: LMA Insertion Date/Time: 10/23/2015 11:55 AM Performed by: Edison PaceGRAY, Amel Kitch E Pre-anesthesia Checklist: Patient identified, Timeout performed, Emergency Drugs available, Suction available and Patient being monitored Patient Re-evaluated:Patient Re-evaluated prior to inductionOxygen Delivery Method: Circle system utilized Preoxygenation: Pre-oxygenation with 100% oxygen Intubation Type: IV induction Ventilation: Mask ventilation without difficulty LMA: LMA with gastric port inserted LMA Size: 4.0 Number of attempts: 1 Placement Confirmation: positive ETCO2 Tube secured with: Tape Dental Injury: Teeth and Oropharynx as per pre-operative assessment

## 2015-10-23 NOTE — Discharge Instructions (Signed)
General Anesthesia, Adult, Care After Refer to this sheet in the next few weeks. These instructions provide you with information on caring for yourself after your procedure. Your health care provider may also give you more specific instructions. Your treatment has been planned according to current medical practices, but problems sometimes occur. Call your health care provider if you have any problems or questions after your procedure. WHAT TO EXPECT AFTER THE PROCEDURE After the procedure, it is typical to experience:  Sleepiness.  Nausea and vomiting. HOME CARE INSTRUCTIONS  For the first 24 hours after general anesthesia:  Have a responsible person with you.  Do not drive a car. If you are alone, do not take public transportation.  Do not drink alcohol.  Do not take medicine that has not been prescribed by your health care provider.  Do not sign important papers or make important decisions.  You may resume a normal diet and activities as directed by your health care provider.  If you have questions or problems that seem related to general anesthesia, call the hospital and ask for the anesthetist or anesthesiologist on call. SEEK MEDICAL CARE IF:  You have nausea and vomiting that continue the day after anesthesia.  You develop a rash. SEEK IMMEDIATE MEDICAL CARE IF:   You have difficulty breathing.  You have chest pain.  You have any allergic problems.   This information is not intended to replace advice given to you by your health care provider. Make sure you discuss any questions you have with your health care provider.   Document Released: 08/17/2000 Document Revised: 06/01/2014 Document Reviewed: 09/09/2011 Elsevier Interactive Patient Education 2016 [employer]      YOU MAY SHOWER IN 24 HOURS.Marland Kitchen. WEIGHT BEARING AS TOLERATED.Marland Kitchen..Marland Kitchen

## 2015-10-23 NOTE — Anesthesia Postprocedure Evaluation (Signed)
Anesthesia Post Note  Patient: [name]  Procedure(s) Performed: Procedure(s) (LRB): RIGHT KNEE HARDWARE REMOVAL (Right)  Patient location during evaluation: PACU Anesthesia Type: General Level of consciousness: awake and alert Pain management: pain level controlled Vital Signs Assessment: post-procedure vital signs reviewed and stable Respiratory status: spontaneous breathing, nonlabored ventilation, respiratory function stable and patient connected to nasal cannula oxygen Cardiovascular status: blood pressure returned to baseline and stable Postop Assessment: no signs of nausea or vomiting Anesthetic complications: no    Last Vitals:  Filed Vitals:   10/23/15 1245 10/23/15 1300  BP: 121/82 118/80  Pulse: 81 91  Temp:  36.4 C  Resp: 11 17    Last Pain:  Filed Vitals:   10/23/15 1304  PainSc: 5         RLE Motor Response: Purposeful movement;Responds to commands (10/23/15 1300) RLE Sensation: Full sensation (10/23/15 1300)      Jamira Barfuss L

## 2015-10-23 NOTE — Brief Op Note (Signed)
10/23/2015  12:33 PM  PATIENT:  [name]  60 y.o. male  PRE-OPERATIVE DIAGNOSIS:  PAINFUL HARDWARE RIGHT KNEE   POST-OPERATIVE DIAGNOSIS:  PAINFUL HARDWARE RIGHT KNEE   PROCEDURE:  Procedure(s) with comments: RIGHT KNEE HARDWARE REMOVAL (Right) - LMA  SURGEON:  Surgeon(s) and Role:    * Ollen GrossFrank Keiden Deskin, MD - Primary  PHYSICIAN ASSISTANT:   ASSISTANTS: none   ANESTHESIA:   general  EBL:  Total I/O In: 1000 [I.V.:1000] Out: 0   BLOOD ADMINISTERED:none  DRAINS: none   LOCAL MEDICATIONS USED:  MARCAINE     COUNTS:  YES  TOURNIQUET:    DICTATION: .Other Dictation: Dictation Number [id number]  PLAN OF CARE: Discharge to home after PACU  PATIENT DISPOSITION:  PACU - hemodynamically stable.

## 2015-10-23 NOTE — Transfer of Care (Signed)
Immediate Anesthesia Transfer of Care Note  Patient: [name]  Procedure(s) Performed: Procedure(s) with comments: RIGHT KNEE HARDWARE REMOVAL (Right) - LMA  Patient Location: PACU  Anesthesia Type:General  Level of Consciousness: awake, oriented, patient cooperative, lethargic and responds to stimulation  Airway & Oxygen Therapy: Patient Spontanous Breathing and Patient connected to face mask oxygen  Post-op Assessment: Report given to RN, Post -op Vital signs reviewed and stable and Patient moving all extremities  Post vital signs: Reviewed and stable  Last Vitals:  Filed Vitals:   10/23/15 0923  BP: 125/80  Pulse: 72  Temp: 36.4 C  Resp: 18    Last Pain:  Filed Vitals:   10/23/15 0943  PainSc: 2       Patients Stated Pain Goal: 4 (10/23/15 0942)  Complications: No apparent anesthesia complications

## 2015-10-24 ENCOUNTER — Encounter (HOSPITAL_COMMUNITY): Payer: Self-pay | Admitting: Orthopedic Surgery

## 2015-10-24 NOTE — Op Note (Signed)
[name], Patrick               ACCOUNT NO.:  [id number]  MEDICAL RECORD NO.:  [id number]  LOCATION:  [facility]                         [facility]  PHYSICIAN:  [name], M.D.    DATE OF BIRTH:  [birth year]  DATE OF PROCEDURE:  10/23/2015 DATE OF DISCHARGE:  10/23/2015                              OPERATIVE REPORT   PREOPERATIVE DIAGNOSIS:  Painful hardware, right knee.  POSTOPERATIVE DIAGNOSIS:  Painful hardware, right knee.  PROCEDURE:  Right knee hardware removal.  SURGEON:  Patrick GrossFrank Daquavion Singh, M.D.  ASSISTANT:  None.  ANESTHESIA:  General.  ESTIMATED BLOOD LOSS:  Minimal.  DRAINS:  None.  COMPLICATIONS:  None.  CONDITION:  Stable to recovery.  BRIEF CLINICAL NOTE:  [name] is a 60 year old male, ORIF of a right patella fracture last year, it healed uneventfully, but has developed pain related to the hardware.  He presents now for hardware removal.  PROCEDURE IN DETAIL:  After successful administration of general anesthetic, tourniquet was placed high on the right thigh.  Right lower extremity was prepped and draped in usual sterile fashion.  We did not utilize a tourniquet for the procedure.  The knot for the wire was palpable subcutaneously.  I made an incision starting at that point, coursing distally, total of 2 inches.  The skin was cut with 10-blade through subcutaneous tissue.  Meticulous hemostasis was achieved.  The knot was identified and the wire cut.  The wire was removed and this was the 16-gauge cerclage wire.  The tips of the K-wires were then palpated distally and they were identified and the K-wires removed.  This effectively removed all the hardware from the patella.  The wound was then copiously irrigated with saline solution and meticulous hemostasis was achieved with electrocautery.  I irrigated further and then closed the subcutaneous tissue with interrupted 2-0 Vicryl and subcuticular tissue with running 4-0 Monocryl.  Prior to closure, I  injected the subcutaneous tissues with 30 mL of 0.25% Marcaine with epi.  Once the incision was closed, then, this was cleaned and dried and Steri-Strips and bulky sterile dressing were applied.  He was then awakened and transported to recovery in stable condition.     [name], M.D.     FA/MEDQ  D:  10/23/2015  T:  10/24/2015  Job:  [id number]

## 2015-10-24 NOTE — Op Note (Signed)
NAMDennie Maizes:  Urschel, Edger               ACCOUNT NO.:  [id number]  MEDICAL RECORD NO.:  [id number]  LOCATION:  [facility]                         [facility]  PHYSICIAN:  Ollen GrossFrank Corazon Nickolas, M.D.    DATE OF BIRTH:  1955/09/08  DATE OF PROCEDURE:  10/23/2015 DATE OF DISCHARGE:  10/23/2015                              OPERATIVE REPORT   PREOPERATIVE DIAGNOSIS:  Painful hardware, right knee.  POSTOPERATIVE DIAGNOSIS:  Painful hardware, right knee.  PROCEDURE:  Hardware removal, right knee.  SURGEON:  Ollen GrossFrank Tegan Britain, MD  ASSISTANT:  No assistant.  ANESTHESIA:  General.  ESTIMATED BLOOD LOSS:  Minimal.  DRAIN:  None.  COMPLICATIONS:  None.  CONDITION:  Stable to recovery.  BRIEF CLINICAL NOTE:  [name] is a 60 year old male, had a right patella fracture last year.  He healed uneventfully.  He said he has developed pain related to his hardware.  He presents now for hardware removal.  PROCEDURE IN DETAIL:  After successful administration of general anesthetic, tourniquet was placed high on the right thigh, right lower extremity, prepped and draped in the usual sterile fashion.  We did not utilize a tourniquet for the case.  Previous midline incisions were utilized.  A small portion incision about 2 inches long was made starting at the area of the mid patella where the wire was palpable and coursing down to inferior pole of the patella where the other 2 K-wires were present.  Skin was cut with a 10 blade through the subcutaneous tissue.  Meticulous hemostasis was achieved.  The knot over the wire is identified and then a wire cut.  I removed the 16-gauge cerclage wire. I then extended the incision inferiorly to palpate the distal portion of the K-wires.  They were identified and the K-wires were removed.  This effectively removes all the hardware from the patella.  Wound was irrigated with saline solution.  Meticulous hemostasis was achieved with electrocautery.  A total of 30 mL of 0.25%  Marcaine with epinephrine was injected into the subcutaneous tissues.  Subcu was then closed with interrupted 2-0 Vicryl and subcuticular running 4-0 Monocryl.  Incision was cleaned and dried and Steri-Strips and a bulky sterile dressing applied.  He was then awakened and transported to recovery in stable condition.     Ollen GrossFrank Lamel Mccarley, M.D.     FA/MEDQ  D:  10/23/2015  T:  10/24/2015  Job:  [id number]

## 2015-11-05 DIAGNOSIS — T8484XD Pain due to internal orthopedic prosthetic devices, implants and grafts, subsequent encounter: Secondary | ICD-10-CM | POA: Diagnosis not present

## 2015-11-05 DIAGNOSIS — Z9889 Other specified postprocedural states: Secondary | ICD-10-CM | POA: Diagnosis not present

## 2016-05-01 ENCOUNTER — Telehealth: Payer: Self-pay | Admitting: Medical

## 2016-05-01 NOTE — Telephone Encounter (Signed)
Called patient to schedule annual wellness appt. Patient did not answer. Left msg for patient to call office to schedule appt.

## 2016-05-04 ENCOUNTER — Ambulatory Visit: Payer: Self-pay | Admitting: Medical

## 2016-05-05 ENCOUNTER — Ambulatory Visit (INDEPENDENT_AMBULATORY_CARE_PROVIDER_SITE_OTHER): Payer: Medicare Other | Admitting: Medical

## 2016-05-05 ENCOUNTER — Encounter: Payer: Self-pay | Admitting: Medical

## 2016-05-05 ENCOUNTER — Ambulatory Visit (HOSPITAL_BASED_OUTPATIENT_CLINIC_OR_DEPARTMENT_OTHER)
Admission: RE | Admit: 2016-05-05 | Discharge: 2016-05-05 | Disposition: A | Discharge status: Home / Self Care | Payer: Medicare Other | Source: Ambulatory Visit | Attending: Medical | Admitting: Medical

## 2016-05-05 VITALS — BP 100/68 | HR 72 | Temp 97.9°F | Ht 67.0 in | Wt 128.4 lb

## 2016-05-05 DIAGNOSIS — M47892 Other spondylosis, cervical region: Secondary | ICD-10-CM | POA: Diagnosis not present

## 2016-05-05 DIAGNOSIS — M8588 Other specified disorders of bone density and structure, other site: Secondary | ICD-10-CM | POA: Diagnosis not present

## 2016-05-05 DIAGNOSIS — M4124 Other idiopathic scoliosis, thoracic region: Secondary | ICD-10-CM | POA: Diagnosis not present

## 2016-05-05 DIAGNOSIS — M4312 Spondylolisthesis, cervical region: Secondary | ICD-10-CM | POA: Insufficient documentation

## 2016-05-05 DIAGNOSIS — M542 Cervicalgia: Secondary | ICD-10-CM

## 2016-05-05 DIAGNOSIS — S46819A Strain of other muscles, fascia and tendons at shoulder and upper arm level, unspecified arm, initial encounter: Secondary | ICD-10-CM | POA: Diagnosis not present

## 2016-05-05 DIAGNOSIS — M47812 Spondylosis without myelopathy or radiculopathy, cervical region: Secondary | ICD-10-CM | POA: Diagnosis not present

## 2016-05-05 MED ORDER — TRAMADOL HCL 50 MG PO TABS: 50.0000 mg | ORAL_TABLET | Freq: Four times a day (QID) | ORAL | 0 refills | Status: DC | PRN

## 2016-05-05 MED ORDER — TIZANIDINE HCL 4 MG PO CAPS: ORAL_CAPSULE | ORAL | 0 refills | Status: DC

## 2016-05-05 NOTE — Progress Notes (Signed)
Pre visit review using our clinic review tool, if applicable. No additional management support is needed unless otherwise documented below in the visit note. 

## 2016-05-05 NOTE — Patient Instructions (Addendum)
Your pain appears to be in trapezius muscles and may be related to your scoliosis of thoracic spine. I do want to get cspine xray today since pain on both sides. Evaluate it degenerative component to pain.  For your pain I want you to take ibuprofen 200-400 mg every 8 hours as needed for pain. At night if trapezius feel real tight rx tinazadine. For more severe pain rx tramadol.(limted number given)  Follow up in 3 weeks or as needed

## 2016-05-05 NOTE — Progress Notes (Signed)
Subjective:    Patient ID: [name], male    DOB: 10/20/1955, 60 y.o.   MRN: [id number]  HPI  Pt in for follow evaluation.  Pt states he has some upper back trapezius pain on daily basis. He attributes pain to scoliois. In past would have rare pain. More self limited. Pt states tylenol otc. 2 tab every 6 hours.  Pt states pain daily for 2 months. Pt states pain level is 7/10. He request stronger med.  Pt does have known thoracic scoliosis.  No pain radiating down his arms. No trauma or injury before onset of pain.  Pt clarifies with me no ha. Pain is in trapezius muscles both sides. Worse on rt side. Some pain where base occiptial area. Neg neurologic signs or symptoms on review.  Review of Systems  Constitutional: Negative for chills, fatigue and fever.  Respiratory: Negative for cough, chest tightness, shortness of breath and wheezing.   Cardiovascular: Negative for chest pain and palpitations.  Gastrointestinal: Negative for abdominal pain.  Endocrine: Negative for polydipsia, polyphagia and polyuria.  Musculoskeletal: Positive for neck pain. Negative for neck stiffness.       Trapezius pain.  Skin: Negative for rash.  Neurological: Negative for dizziness, seizures, syncope, speech difficulty, weakness, numbness and headaches.       No ha pt clarifies.  Hematological: Negative for adenopathy. Does not bruise/bleed easily.  Psychiatric/Behavioral: Negative for behavioral problems and confusion.   Past Medical History:  Diagnosis Date  . Anxiety   . Anxiety and depression 03/14/2010  . Colonic inertia    with chronic lifelong costipation  . Congenital fusion of cervical spine   . Depression   . FECAL INCONTINENCE 08/19/2007  . H/O: GI bleed   . Heart murmur    early childhood   . Hydrocephalus --per CT 04/2014 07/23/2014  . Idiopathic scoliosis   . 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 Pneumonia    childhood      Social History   Social History  . Marital status:  Single    Spouse name: N/A  . Number of children: 0  . Years of education: N/A   Occupational History  . 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 Smoking status: Never Smoker  . Smokeless tobacco: Never Used  . Alcohol use 0.0 oz/week     Comment: socially   . Drug use: No  . Sexual activity: No   Other Topics Concern  . Not on file   Social History Narrative   Lives w/ mother , has a younger brother and sister     Past Surgical History:  Procedure Laterality Date  . COLONOSCOPY  11/08/2006  . HARDWARE REMOVAL Right 10/23/2015   Procedure: RIGHT KNEE HARDWARE REMOVAL;  Surgeon: Ollen GrossFrank Aluisio, MD;  Location: [facility];  Service: Orthopedics;  Laterality: Right;  LMA  . HERNIA REPAIR  1980   left inguinal hernia  . LAPAROTOMY  07/18/2011   Procedure: EXPLORATORY LAPAROTOMY;  Surgeon: Mariella SaaBenjamin T Hoxworth, MD;  Location: [facility];  Service: General;  Laterality: N/A;  lysis of adhesions entero enterostomy  . LAPAROTOMY  07/28/2011   Procedure: EXPLORATORY LAPAROTOMY;  Surgeon: Mariella SaaBenjamin T Hoxworth, MD;  Location: [facility];  Service: General;  Laterality: N/A;  . Left Knee surgery  1994, 1992  . ORIF PATELLA Right 12/14/2014   Procedure: OPEN REDUCTION INTERNAL (ORIF) FIXATION PATELLA;  Surgeon: Ollen GrossFrank Aluisio, MD;  Location: [facility];  Service:  Orthopedics;  Laterality: Right;  . Surgery for imperforate anus  1958  . TONSILLECTOMY    . UPPER GASTROINTESTINAL ENDOSCOPY  05/15/03    Family History  Problem Relation Age of Onset  . Alzheimer's disease Father   . Diabetes Mother   . Colon cancer Neg Hx   . Prostate cancer Neg Hx     No Known Allergies  No current outpatient prescriptions on file prior to visit.   No current facility-administered medications on file prior to visit.     BP 100/68 (BP Location: Left Arm, Patient Position: Sitting, Cuff Size: Normal)   Pulse 72   Temp 97.9 F (36.6 C) (Oral)   Ht 5\' 7"  (1.702 m)   Wt 128 lb 6.4  oz (58.2 kg)   SpO2 99%   BMI 20.11 kg/m       Objective:   Physical Exam  General- No acute distress. Pleasant patient. Neck- Full range of motion, no jvd. No mid spine pain. But both side trapezius tender to palpation. Worse on rt side.(pain area include insertion point of trapezius in occiptial area. Lungs- Clear, even and unlabored. Heart- regular rate and rhythm. Neurologic- CNII- XII grossly intact. Skin- no rash on skin present.  Back- upper back. Lower trapezius area tender to palpation.       Assessment & Plan:  Your pain appears to be in trapezius muscles and may be related to your scoliosis of thoracic spine. I do want to get cspine xray today.  I do want to get cspine xray today since pain on both sides. Evaluate it degenerative component to pain.  For your pain I want you to take ibuprofen 200-400 mg every 8 hours as needed for pain. At night if trapezius feel real tight rx tinazadine. For more severe pain rx tramadol.(limited number given)  Follow up in 3 weeks or as needed  Lakeithia Rasor, Ramon DredgeEdward, VF CorporationPA-C

## 2016-06-09 ENCOUNTER — Telehealth: Payer: Self-pay | Admitting: Medical

## 2016-06-09 NOTE — Telephone Encounter (Signed)
Relation to WU:JWJXpt:self Call back number: [phone]   Reason for call:  Patient requesting a refill traMADol (ULTRAM) 50 MG tablet

## 2016-06-09 NOTE — Telephone Encounter (Signed)
Refill request for Tramadol 50mg  Last filled by MD on - 05/05/16, #12x0 Last AEX - 05/05/16 Next AEX - Was to F/U in 3 wks, if not better; he has not scheduled any f/u appointment Please Advise on refills/SLS 01/16

## 2016-06-10 MED ORDER — TRAMADOL HCL 50 MG PO TABS: 50.0000 mg | ORAL_TABLET | Freq: Four times a day (QID) | ORAL | 0 refills | Status: DC | PRN

## 2016-06-10 NOTE — Telephone Encounter (Signed)
I am going to fill rx of tramadol 12 tabs only. Pt can pick up rx or you can fax. But advise pt he needs to schedule follow up. Depending on how he is doing and if it appears he will need future refills of tramadol then he needs to sign controlled med contract and give uds. Does he have insurance? If not would ask Kristi in lab what is cost of drug screen for patients with no insurance?

## 2016-06-12 NOTE — Telephone Encounter (Signed)
Patient informed, understood & agreed; scheduled OV for Monday 06/16/15, [facility] 01/19

## 2016-06-15 ENCOUNTER — Ambulatory Visit (INDEPENDENT_AMBULATORY_CARE_PROVIDER_SITE_OTHER): Payer: Medicare Other | Admitting: Medical

## 2016-06-15 ENCOUNTER — Encounter: Payer: Self-pay | Admitting: Medical

## 2016-06-15 VITALS — BP 101/72 | HR 64 | Temp 98.0°F | Resp 16 | Ht 67.0 in | Wt 127.4 lb

## 2016-06-15 DIAGNOSIS — M542 Cervicalgia: Secondary | ICD-10-CM

## 2016-06-15 MED ORDER — DICLOFENAC SODIUM 75 MG PO TBEC: 75.0000 mg | DELAYED_RELEASE_TABLET | Freq: Two times a day (BID) | ORAL | 0 refills | Status: DC

## 2016-06-15 MED ORDER — TRAMADOL HCL 50 MG PO TABS: ORAL_TABLET | ORAL | 0 refills | Status: DC

## 2016-06-15 NOTE — Patient Instructions (Signed)
For your neck and shoulder pain would advise will rx diclofenac but stop otc nsaids. For severe pain rx tramadol.   Controlled med contract signed today and UDS given today(insrtucted to go to lab and give uds).  Recommend calling us about 3 days in advance before you run out so we can refill med.  Follow up in 3-4 months.

## 2016-06-15 NOTE — Progress Notes (Signed)
Subjective:    Patient ID: [name], male    DOB: 12-22-1955, 61 y.o.   MRN: [id number]  HPI  Pt in for follow up. Pt has severe diffuse degenerative changes of c-spine.  He has hx of trapezius myalgia on last visit as well and known thoracic scoliosis.   No pain radiating down his arms. No trauma or injury before onset of pain.  Pt states ibuprofen did not work well for his pain. He did take the tramadol and found that it helped a lot. He only takes twice a day.   Pt is out of tramadol now.  Pt declines flu vaccine today. Discussed benefit. He decided against.  Pt states neck pain has been for years but recently getting worse.    Review of Systems  Constitutional: Negative for chills and fatigue.  Respiratory: Negative for cough, chest tightness and wheezing.   Cardiovascular: Negative for chest pain and palpitations.  Gastrointestinal: Negative for abdominal pain.  Musculoskeletal: Positive for back pain.       Neck pain worse. Some shoulder pain and upper tspine pain.  Skin: Negative for rash.  Neurological: Negative for dizziness, speech difficulty, weakness and headaches.  Hematological: Negative for adenopathy. Does not bruise/bleed easily.  Psychiatric/Behavioral: Negative for behavioral problems, confusion, dysphoric mood and suicidal ideas.   Past Medical History:  Diagnosis Date  . Anxiety   . Anxiety and depression 03/14/2010  . Colonic inertia    with chronic lifelong costipation  . Congenital fusion of cervical spine   . Depression   . FECAL INCONTINENCE 08/19/2007  . H/O: GI bleed   . Heart murmur    early childhood   . Hydrocephalus --per CT 04/2014 07/23/2014  . Idiopathic scoliosis   . 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 Pneumonia    childhood      Social History   Social History  . Marital status: Single    Spouse name: N/A  . Number of children: 0  . Years of education: N/A   Occupational History  . 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 Smoking status: Never Smoker  . Smokeless tobacco: Never Used  . Alcohol use 0.0 oz/week     Comment: socially   . Drug use: No  . Sexual activity: No   Other Topics Concern  . Not on file   Social History Narrative   Lives w/ mother , has a younger brother and sister     Past Surgical History:  Procedure Laterality Date  . COLONOSCOPY  11/08/2006  . HARDWARE REMOVAL Right 10/23/2015   Procedure: RIGHT KNEE HARDWARE REMOVAL;  Surgeon: Patrick Gross, MD;  Location: [facility];  Service: Orthopedics;  Laterality: Right;  LMA  . HERNIA REPAIR  1980   left inguinal hernia  . LAPAROTOMY  07/18/2011   Procedure: EXPLORATORY LAPAROTOMY;  Surgeon: Patrick Saa, MD;  Location: [facility];  Service: General;  Laterality: N/A;  lysis of adhesions entero enterostomy  . LAPAROTOMY  07/28/2011   Procedure: EXPLORATORY LAPAROTOMY;  Surgeon: Patrick Saa, MD;  Location: [facility];  Service: General;  Laterality: N/A;  . Left Knee surgery  1994, 1992  . ORIF PATELLA Right 12/14/2014   Procedure: OPEN REDUCTION INTERNAL (ORIF) FIXATION PATELLA;  Surgeon: Patrick Gross, MD;  Location: [facility];  Service: Orthopedics;  Laterality: Right;  . Surgery for imperforate anus  1958  . TONSILLECTOMY    . UPPER GASTROINTESTINAL  ENDOSCOPY  05/15/03    Family History  Problem Relation Age of Onset  . Alzheimer's disease Father   . Diabetes Mother   . Colon cancer Neg Hx   . Prostate cancer Neg Hx     No Known Allergies  Current Outpatient Prescriptions on File Prior to Visit  Medication Sig Dispense Refill  . traMADol (ULTRAM) 50 MG tablet Take 1 tablet (50 mg total) by mouth every 6 (six) hours as needed. 12 tablet 0   No current facility-administered medications on file prior to visit.     BP 101/72 (BP Location: Right Arm, Patient Position: Sitting, Cuff Size: Normal)   Pulse 64   Temp 98 F (36.7 C) (Oral)   Resp 16   Ht 5\' 7"  (1.702 m)   Wt 127  lb 6 oz (57.8 kg)   SpO2 98%   BMI 19.95 kg/m       Objective:   Physical Exam  General- No acute distress. Pleasant patient. Neck- Full range of motion, no jvd. Trapezius pain on palpation. No mid cspine. Upper back- no t-spine. Lungs- Clear, even and unlabored. Heart- regular rate and rhythm. Cranial Nerve exam:- CN III-XII intact(No nystagmus), symmetric smile. Strength:- 5/5 equal and symmetric strength both upper and lower extremities.       Assessment & Plan:  For your neck and shoulder pain would advise will rx diclofenac but stop otc nsaids. For severe pain rx tramadol.   Controlled med contract signed today and UDS given today(insrtucted to go to lab and give uds).  Recommend calling us about 3 days in advance before you run out so we can refill med.  Follow up in 3-4 months.  [name], Ramon DredgeEdward, PA-C

## 2016-06-15 NOTE — Progress Notes (Signed)
Pre visit review using our clinic review tool, if applicable. No additional management support is needed unless otherwise documented below in the visit note/SLS  

## 2016-06-16 ENCOUNTER — Encounter: Payer: Self-pay | Admitting: Medical

## 2016-06-16 DIAGNOSIS — Z79891 Long term (current) use of opiate analgesic: Secondary | ICD-10-CM | POA: Diagnosis not present

## 2016-06-22 IMAGING — RF DG KNEE 1-2V*R*
1 series · 2 of 2 positions shown · non-contrast
Comparison: December 11, 2014

CLINICAL DATA: Open reduction internal fixation for patellar
fracture

EXAM:
DG C-ARM 1-60 MIN - [facility]; RIGHT KNEE - 1-2 VIEW

[Series 1: run · 2 of 2 slices shown]
[im 1/2]
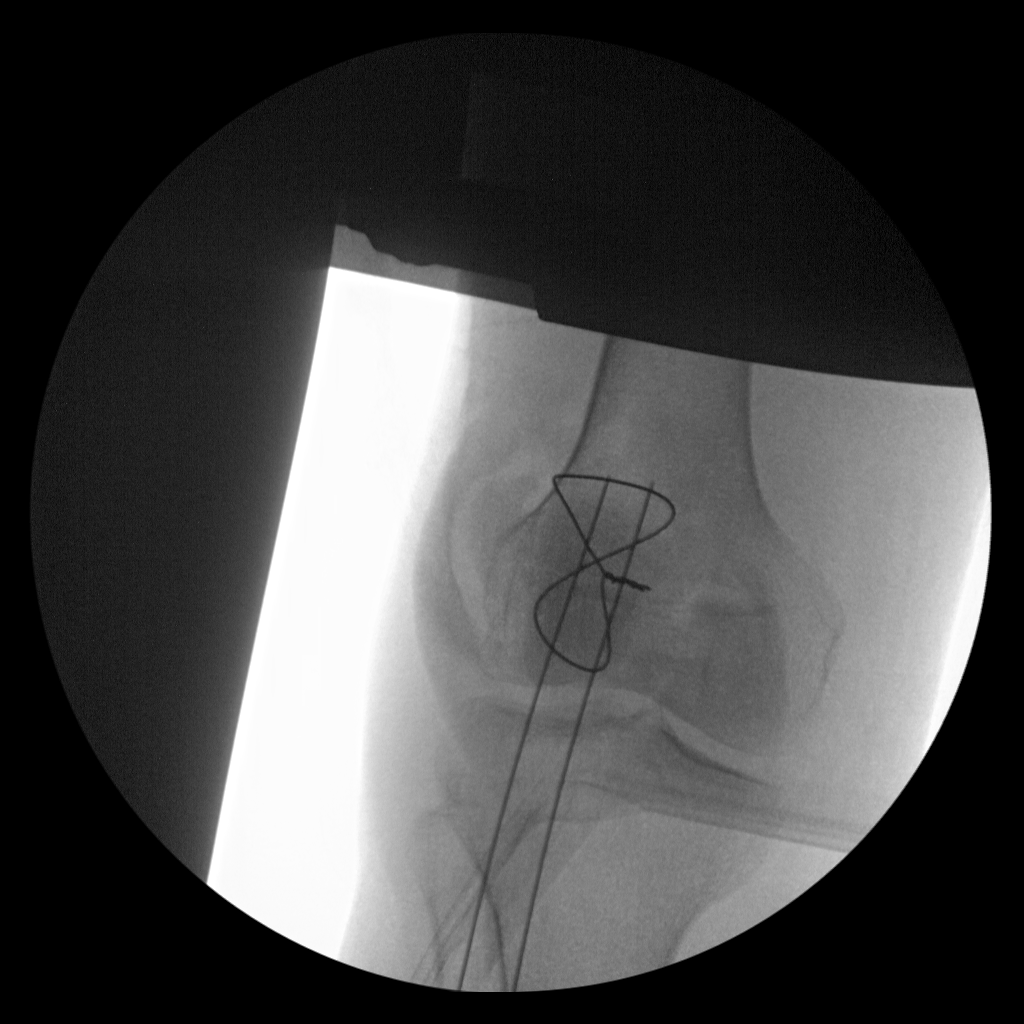
[im 2/2]
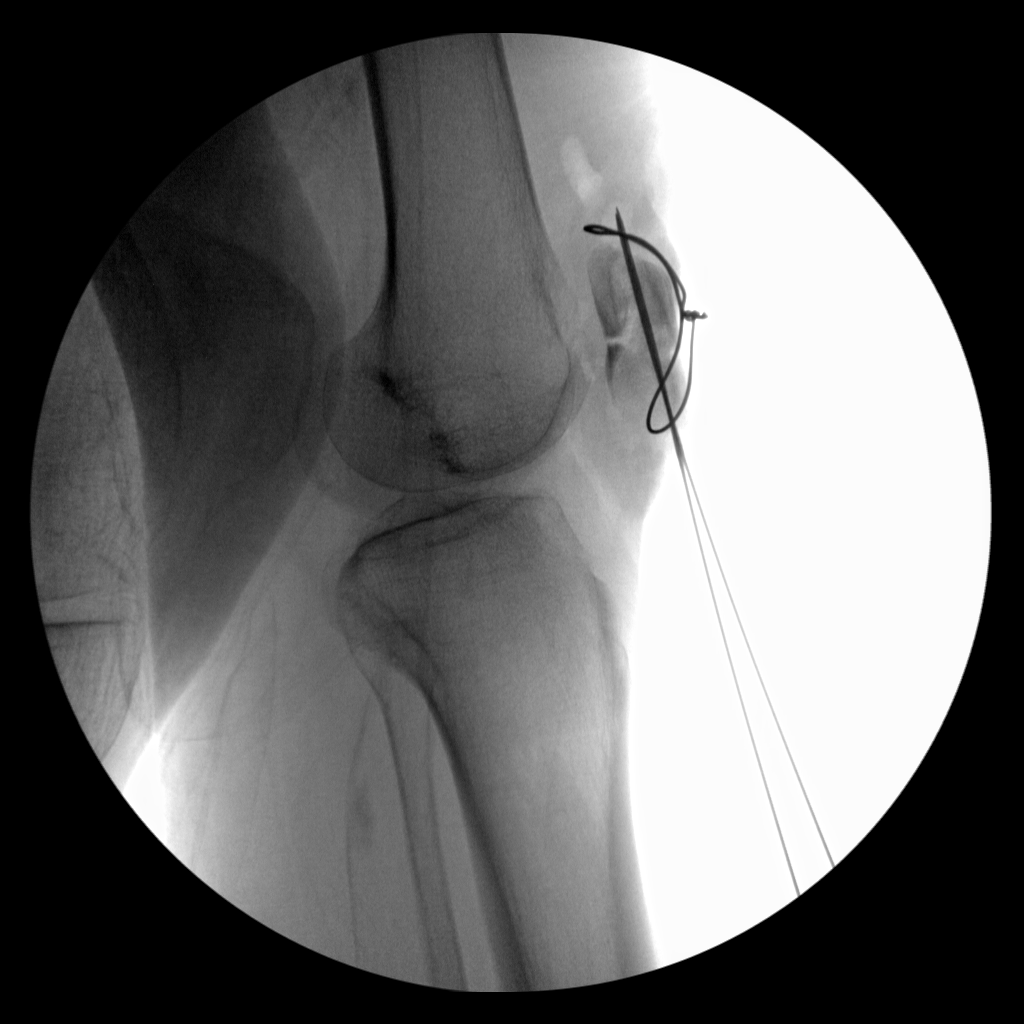

[2 of 2 positions shown; findings below may reference images not displayed]

FLUOROSCOPY TIME:  Fluoroscopy Time:  0 minutes 6 second

Number of Acquired Images:  2
FINDINGS: Frontal and lateral views were obtained. Images show pin fixation
through a comminuted fracture of the patella. Alignment is near
anatomic on the submitted images. No other fracture. No dislocation.
Joint spaces appear intact.
IMPRESSION: Pin fixation through a patellar fracture with alignment near
anatomic on submitted images.

## 2016-06-30 ENCOUNTER — Telehealth: Payer: Self-pay | Admitting: Medical

## 2016-06-30 NOTE — Telephone Encounter (Signed)
Patient scheduled medicare wellness [facility]2016 [facility]

## 2016-07-06 NOTE — Progress Notes (Deleted)
Subjective:   Fabio BeringRandall N Toren is a 61 y.o. male who presents for an Initial Medicare Annual Wellness Visit.  Review of Systems  No ROS.  Medicare Wellness Visit.    Sleep patterns: {SX; SLEEP PATTERNS:18802::"feels rested on waking","does not get up to void","gets up *** times nightly to void","sleeps *** hours nightly"}.   Home Safety/Smoke Alarms:   Living environment; residence and Firearm Safety: {Rehab home environment / accessibility:30080::"no firearms","firearms stored safely"}. Seat Belt Safety/Bike Helmet: Wears seat belt.   Counseling:   Eye Exam-  Dental-  Male:   [facility]-     PSA-  Lab Results  Component Value Date   PSA 0.19 07/26/2014       Objective:    There were no vitals filed for this visit. There is no height or weight on file to calculate BMI.  Current Medications (verified) Outpatient Encounter Prescriptions as of 07/09/2016  Medication Sig  . diclofenac (VOLTAREN) 75 MG EC tablet Take 1 tablet (75 mg total) by mouth 2 (two) times daily.  . traMADol (ULTRAM) 50 MG tablet 1 tab po q 12 hrs as needed moderate to severe pain   No facility-administered encounter medications on file as of 07/09/2016.     Allergies (verified) Patient has no known allergies.   History: Past Medical History:  Diagnosis Date  . Anxiety   . Anxiety and depression 03/14/2010  . Colonic inertia    with chronic lifelong costipation  . Congenital fusion of cervical spine   . Depression   . FECAL INCONTINENCE 08/19/2007  . H/O: GI bleed   . Heart murmur    early childhood   . Hydrocephalus --per CT 04/2014 07/23/2014  . Idiopathic scoliosis   . 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 Pneumonia    childhood    Past Surgical History:  Procedure Laterality Date  . COLONOSCOPY  11/08/2006  . HARDWARE REMOVAL Right 10/23/2015   Procedure: RIGHT KNEE HARDWARE REMOVAL;  Surgeon: Ollen GrossFrank Aluisio, MD;  Location: [facility];  Service: Orthopedics;  Laterality: Right;  LMA  . HERNIA REPAIR   1980   left inguinal hernia  . LAPAROTOMY  07/18/2011   Procedure: EXPLORATORY LAPAROTOMY;  Surgeon: Mariella SaaBenjamin T Hoxworth, MD;  Location: [facility];  Service: General;  Laterality: N/A;  lysis of adhesions entero enterostomy  . LAPAROTOMY  07/28/2011   Procedure: EXPLORATORY LAPAROTOMY;  Surgeon: Mariella SaaBenjamin T Hoxworth, MD;  Location: [facility];  Service: General;  Laterality: N/A;  . Left Knee surgery  1994, 1992  . ORIF PATELLA Right 12/14/2014   Procedure: OPEN REDUCTION INTERNAL (ORIF) FIXATION PATELLA;  Surgeon: Ollen GrossFrank Aluisio, MD;  Location: [facility];  Service: Orthopedics;  Laterality: Right;  . Surgery for imperforate anus  1958  . TONSILLECTOMY    . UPPER GASTROINTESTINAL ENDOSCOPY  05/15/03   Family History  Problem Relation Age of Onset  . Alzheimer's disease Father   . Diabetes Mother   . Colon cancer Neg Hx   . Prostate cancer Neg Hx    Social History   Occupational History  . 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 Smoking status: Never Smoker  . Smokeless tobacco: Never Used  . Alcohol use 0.0 oz/week     Comment: socially   . Drug use: No  . Sexual activity: No   Tobacco Counseling Counseling given: Not Answered   Activities of Daily Living In your present state of health, do you have any difficulty performing  the following activities: 10/23/2015  Hearing? N  Vision? N  Difficulty concentrating or making decisions? N  Dressing or bathing? N  Some recent data might be hidden    Immunizations and Health Maintenance Immunization History  Administered Date(s) Administered  . Tdap 07/23/2014   Health Maintenance Due  Topic Date Due  . Hepatitis C Screening  December 02, 1955  . HIV Screening  12/29/1971  . COLONOSCOPY  12/29/2006    Patient Care Team: Esperanza Richters, PA-C as PCP - General (Physician Assistant)  Indicate any recent Medical Services you may have received from other than [facility] providers in the past year (date may be  approximate).    Assessment:   This is a routine wellness examination for Stanislav. Physical assessment deferred to PCP.   Hearing/Vision screen No exam data present  Dietary issues and exercise activities discussed:   Diet (meal preparation, eat out, water intake, caffeinated beverages, dairy products, fruits and vegetables): {Desc; diets:16563} Breakfast: Lunch:  Dinner:      Goals    None     Depression Screen No flowsheet data found.  Fall Risk Fall Risk  06/28/2015 02/26/2015  Falls in the past year? No No    Cognitive Function: MMSE - Mini Mental State Exam 02/26/2015  Orientation to time 5  Orientation to Place 5        Screening Tests Health Maintenance  Topic Date Due  . Hepatitis C Screening  18-May-1956  . HIV Screening  12/29/1971  . COLONOSCOPY  12/29/2006  . INFLUENZA VACCINE  08/22/2016 (Originally 12/24/2015)  . TETANUS/TDAP  07/22/2024        Plan:   ***  During the course of the visit Zavian was educated and counseled about the following appropriate screening and preventive services:   Vaccines to include Pneumoccal, Influenza, Hepatitis B, Td, Zostavax, HCV  Electrocardiogram  Colorectal cancer screening  Cardiovascular disease screening  Diabetes screening  Glaucoma screening  Nutrition counseling  Prostate cancer screening  Smoking cessation counseling  Patient Instructions (the written plan) were given to the patient.   Mady Haagensen [place], [place]   07/06/2016

## 2016-07-06 NOTE — Progress Notes (Deleted)
Pre visit review using our clinic review tool, if applicable. No additional management support is needed unless otherwise documented below in the visit note. 

## 2016-07-09 ENCOUNTER — Ambulatory Visit: Payer: Self-pay | Admitting: *Deleted

## 2016-08-04 ENCOUNTER — Telehealth: Payer: Self-pay | Admitting: Medical

## 2016-08-04 MED ORDER — TRAMADOL HCL 50 MG PO TABS: ORAL_TABLET | ORAL | 0 refills | Status: DC

## 2016-08-04 NOTE — Telephone Encounter (Signed)
Caller name: Relationship to patient: Self Can be reached: [phone]  Pharmacy:  [facility] [address] [place] - [id number] [phone] (Phone) [phone] (Fax)     Reason for call: Refill traMADol (ULTRAM) 50 MG tablet

## 2016-08-04 NOTE — Telephone Encounter (Signed)
Pt can pick up his tramadol rx today or you can fax. Signed order on your desk.

## 2016-08-04 NOTE — Telephone Encounter (Signed)
Rx faxed to pharmacy/SLS 03/13

## 2016-08-04 NOTE — Telephone Encounter (Signed)
Refill request forTtramadol 50 mg Last filled by MD on - 06/15/16, #60x0 Last AEX - 06/15/2016 [UDS & CSC completed] Next AEX - 3-4 Mths. Please Advise on refills/SLS 03/13

## 2016-08-11 NOTE — Telephone Encounter (Signed)
Pt no-showed previously scheduled AWV.   Called patient to reschedule, primary number is not accepting calls.  Left message on home # to return call to schedule AWV w/ Health Coach.

## 2016-09-28 ENCOUNTER — Other Ambulatory Visit: Payer: Self-pay | Admitting: Medical

## 2016-09-28 MED ORDER — TRAMADOL HCL 50 MG PO TABS: ORAL_TABLET | ORAL | 0 refills | Status: DC

## 2016-09-28 NOTE — Telephone Encounter (Signed)
I printed the rx of tramadol. Will sign and can have him pickup or you can fax. Looks like he had drug screen per note. But I could not find it in epic. Will you print the drug screen so I can review. Isaw result summary on  06-16-2016 but where is the actual report.

## 2016-09-28 NOTE — Telephone Encounter (Signed)
[name]  Relation to pt: self Call back number: [phone] Pharmacy:  Reason for call: Pt is requesting refill on traMADol (ULTRAM) 50 MG tablet. Please advise.

## 2016-09-28 NOTE — Telephone Encounter (Signed)
Refill Request: Tramadol    Last RX:08/04/16 Last OV:06/15/16 Next ZO:XWRUOV:None  UDS:06/15/16 CSC:06/15/16  Please Advise

## 2016-09-29 NOTE — Telephone Encounter (Signed)
Tried to reach pt no answer. Faxed rx to pharmacy.

## 2016-09-29 NOTE — Telephone Encounter (Signed)
Tried to reach pt no answer. Will try again later

## 2016-09-30 ENCOUNTER — Ambulatory Visit: Payer: Self-pay | Admitting: Medical

## 2016-10-27 ENCOUNTER — Ambulatory Visit (INDEPENDENT_AMBULATORY_CARE_PROVIDER_SITE_OTHER): Payer: Medicare Other | Admitting: *Deleted

## 2016-10-27 ENCOUNTER — Encounter: Payer: Self-pay | Admitting: *Deleted

## 2016-10-27 VITALS — BP 112/68 | HR 65 | Ht 67.0 in | Wt 122.0 lb

## 2016-10-27 DIAGNOSIS — Z0000 Encounter for general adult medical examination without abnormal findings: Secondary | ICD-10-CM

## 2016-10-27 NOTE — Progress Notes (Addendum)
Subjective:   [name] is a 61 y.o. male who presents for an Initial Medicare Annual Wellness Visit.  Review of Systems  No ROS.  Medicare Wellness Visit.  Cardiac Risk Factors include: advanced age (>67men, >24 women) Sleep patterns: Sleeps 6-7 hrs per night. Feels rested. Home Safety/Smoke Alarms:  Feels safe in home. Smoke alarms in place.  Living environment; residence and Firearm Safety: Lives with fiance and 3 cats. 1 story home. Guns stored safely. Seat Belt Safety/Bike Helmet: Wears seat belt.   Counseling:   Eye Exam- Wears OTC reading glasses.  Dental- Dr.Kaplan as needed.   Male:   CCS-  Last 11/09/06-normal   PSA-  Lab Results  Component Value Date   PSA 0.19 07/26/2014      Objective:    Today's Vitals   10/27/16 1020  BP: 112/68  Pulse: 65  SpO2: 98%  Weight: 122 lb (55.3 kg)  Height: 5\' 7"  (1.702 m)   Body mass index is 19.11 kg/m.  Current Medications (verified) Outpatient Encounter Prescriptions as of 10/27/2016  Medication Sig  . traMADol (ULTRAM) 50 MG tablet take 1 tablet by mouth every 12 hours if needed for MODERATE TO SEVERE PAIN  . [DISCONTINUED] diclofenac (VOLTAREN) 75 MG EC tablet Take 1 tablet (75 mg total) by mouth 2 (two) times daily.  . [DISCONTINUED] traMADol (ULTRAM) 50 MG tablet 1 tab po q 12 hrs as needed moderate to severe pain   No facility-administered encounter medications on file as of 10/27/2016.     Allergies (verified) Patient has no known allergies.   History: Past Medical History:  Diagnosis Date  . Anxiety   . Anxiety and depression 03/14/2010  . Colonic inertia    with chronic lifelong costipation  . Congenital fusion of cervical spine   . Depression   . FECAL INCONTINENCE 08/19/2007  . H/O: GI bleed   . Heart murmur    early childhood   . Hydrocephalus --per CT 04/2014 07/23/2014  . Idiopathic scoliosis   . 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 Pneumonia    childhood    Past Surgical History:  Procedure  Laterality Date  . COLONOSCOPY  11/08/2006  . HARDWARE REMOVAL Right 10/23/2015   Procedure: RIGHT KNEE HARDWARE REMOVAL;  Surgeon: Ollen Gross, MD;  Location: [facility];  Service: Orthopedics;  Laterality: Right;  LMA  . HERNIA REPAIR  1980   left inguinal hernia  . LAPAROTOMY  07/18/2011   Procedure: EXPLORATORY LAPAROTOMY;  Surgeon: Mariella Saa, MD;  Location: [facility];  Service: General;  Laterality: N/A;  lysis of adhesions entero enterostomy  . LAPAROTOMY  07/28/2011   Procedure: EXPLORATORY LAPAROTOMY;  Surgeon: Mariella Saa, MD;  Location: [facility];  Service: General;  Laterality: N/A;  . Left Knee surgery  1994, 1992  . ORIF PATELLA Right 12/14/2014   Procedure: OPEN REDUCTION INTERNAL (ORIF) FIXATION PATELLA;  Surgeon: Ollen Gross, MD;  Location: [facility];  Service: Orthopedics;  Laterality: Right;  . Surgery for imperforate anus  1958  . TONSILLECTOMY    . UPPER GASTROINTESTINAL ENDOSCOPY  05/15/03   Family History  Problem Relation Age of Onset  . Alzheimer's disease Father   . Diabetes Mother   . Colon cancer Neg Hx   . Prostate cancer Neg Hx    Social History   Occupational History  . 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 Smoking status: Never Smoker  . Smokeless  tobacco: Never Used  . Alcohol use No  . Drug use: No  . Sexual activity: Yes   Tobacco Counseling Counseling given: No   Activities of Daily Living In your present state of health, do you have any difficulty performing the following activities: 10/27/2016  Hearing? N  Vision? N  Difficulty concentrating or making decisions? N  Walking or climbing stairs? N  Dressing or bathing? N  Doing errands, shopping? N  Preparing Food and eating ? N  Using the Toilet? N  In the past six months, have you accidently leaked urine? N  Do you have problems with loss of bowel control? N  Managing your Medications? N  Managing your Finances? N  Housekeeping or  managing your Housekeeping? N  Some recent data might be hidden    Immunizations and Health Maintenance Immunization History  Administered Date(s) Administered  . Tdap 07/23/2014   Health Maintenance Due  Topic Date Due  . Hepatitis C Screening  Dec 27, 1955  . HIV Screening  12/29/1971  . COLONOSCOPY  12/29/2006    Patient Care Team: Saguier, Kateri McEdward, PA-C as PCP - General (Physician Assistant)  Indicate any recent Medical Services you may have received from other than [facility] providers in the past year (date may be approximate).    Assessment:   This is a routine wellness examination for [name]. Physical assessment deferred to PCP.  Hearing/Vision screen  Visual Acuity Screening   Right eye Left eye Both eyes  Without correction: 20/20 20/20 20/20   With correction:       Dietary issues and exercise activities discussed: Current Exercise Habits: Home exercise routine, Type of exercise: walking, Time (Minutes): 30, Frequency (Times/Week): 7, Weekly Exercise (Minutes/Week): 210, Intensity: Mild   Diet (meal preparation, eat out, water intake, caffeinated beverages, dairy products, fruits and vegetables): in general, a "healthy" diet   Breakfast: energy bar and coffee. Lunch: tuna sandwich and soup, coffee Dinner:  Chicken or [place]aw[place]urkey sandwich Drinks a lot of water and gatorade per pt.  Goals      Patient Stated   . Feel more rested. (pt-stated)      Depression Screen PHQ 2/9 Scores 10/27/2016  PHQ - 2 Score 0    Fall Risk Fall Risk  10/27/2016 06/28/2015 02/26/2015  Falls in the past year? No No No    Cognitive Function: MMSE - Mini Mental State Exam 02/26/2015  Orientation to time 5  Orientation to Place 5   Ad8 score reviewed for issues:  Issues making decisions:no  Less interest in hobbies / activities:no  Repeats questions, stories (family complaining):no  Trouble using ordinary gadgets (microwave, computer, phone):no  Forgets the month or year:  no  Mismanaging finances: no  Remembering appts:no  Daily problems with thinking and/or memory: Ad8 score is=0       Screening Tests Health Maintenance  Topic Date Due  . Hepatitis C Screening  Dec 27, 1955  . HIV Screening  12/29/1971  . COLONOSCOPY  12/29/2006  . INFLUENZA VACCINE  12/23/2016  . TETANUS/TDAP  07/22/2024        Plan:   Follow up with PCP as directed.   Continue to eat heart healthy diet (full of fruits, vegetables, whole grains, lean protein, water--limit salt, fat, and sugar intake) and increase physical activity as tolerated.  Continue doing brain stimulating activities (puzzles, reading, adult coloring books, staying active) to keep memory sharp.   Bring a copy of your advance directives to your next office visit.   I have personally reviewed  and noted the following in the patient's chart:   . Medical and social history . Use of alcohol, tobacco or illicit drugs  . Current medications and supplements . Functional ability and status . Nutritional status . Physical activity . Advanced directives . List of other physicians . Hospitalizations, surgeries, and ER visits in previous 12 months . Vitals . Screenings to include cognitive, depression, and falls . Referrals and appointments  In addition, I have reviewed and discussed with patient certain preventive protocols, quality metrics, and best practice recommendations. A written personalized care plan for preventive services as well as general preventive health recommendations were provided to patient.     Avon Gully, RN   10/27/2016    I have reviewed and agree with assessment and plan of RN.  Saguier, Ramon Dredge, PA-C

## 2016-10-27 NOTE — Patient Instructions (Addendum)
[name] , Thank you for taking time to come for your Medicare Wellness Visit. I appreciate your ongoing commitment to your health goals. Please review the following plan we discussed and let me know if I can assist you in the future.   These are the goals we discussed: Goals      Patient Stated   . Feel more rested. (pt-stated)       This is a list of the screening recommended for you and due dates:  Health Maintenance  Topic Date Due  .  Hepatitis C: One time screening is recommended by Center for Disease Control  (CDC) for  adults born from [birth year] through 1965.   December 23, 1955  . HIV Screening  12/29/1971  . Colon Cancer Screening  12/29/2006  . Flu Shot  12/23/2016  . Tetanus Vaccine  07/22/2024   Continue to eat heart healthy diet (full of fruits, vegetables, whole grains, lean protein, water--limit salt, fat, and sugar intake) and increase physical activity as tolerated.  Continue doing brain stimulating activities (puzzles, reading, adult coloring books, staying active) to keep memory sharp.   Bring a copy of your advance directives to your next office visit. Health Maintenance, Male A healthy lifestyle and preventive care is important for your health and wellness. Ask your health care provider about what schedule of regular examinations is right for you. What should I know about weight and diet? Eat a Healthy Diet  Eat plenty of vegetables, fruits, whole grains, low-fat dairy products, and lean protein.  Do not eat a lot of foods high in solid fats, added sugars, or salt.  Maintain a Healthy Weight Regular exercise can help you achieve or maintain a healthy weight. You should:  Do at least 150 minutes of exercise each week. The exercise should increase your heart rate and make you sweat (moderate-intensity exercise).  Do strength-training exercises at least twice a week.  Watch Your Levels of Cholesterol and Blood Lipids  Have your blood tested for lipids and  cholesterol every 5 years starting at 60 years of age. If you are at high risk for heart disease, you should start having your blood tested when you are 60 years old. You may need to have your cholesterol levels checked more often if: ? Your lipid or cholesterol levels are high. ? You are older than 60 years of age. ? You are at high risk for heart disease.  What should I know about cancer screening? Many types of cancers can be detected early and may often be prevented. Lung Cancer  You should be screened every year for lung cancer if: ? You are a current smoker who has smoked for at least 30 years. ? You are a former smoker who has quit within the past 15 years.  Talk to your health care provider about your screening options, when you should start screening, and how often you should be screened.  Colorectal Cancer  Routine colorectal cancer screening usually begins at 60 years of age and should be repeated every 5-10 years until you are 60 years old. You may need to be screened more often if early forms of precancerous polyps or small growths are found. Your health care provider may recommend screening at an earlier age if you have risk factors for colon cancer.  Your health care provider may recommend using home test kits to check for hidden blood in the stool.  A small camera at the end of a tube can be used to examine  your colon (sigmoidoscopy or colonoscopy). This checks for the earliest forms of colorectal cancer.  Prostate and Testicular Cancer  Depending on your age and overall health, your health care provider may do certain tests to screen for prostate and testicular cancer.  Talk to your health care provider about any symptoms or concerns you have about testicular or prostate cancer.  Skin Cancer  Check your skin from head to toe regularly.  Tell your health care provider about any new moles or changes in moles, especially if: ? There is a change in a mole's size, shape,  or color. ? You have a mole that is larger than a pencil eraser.  Always use sunscreen. Apply sunscreen liberally and repeat throughout the day.  Protect yourself by wearing long sleeves, pants, a wide-brimmed hat, and sunglasses when outside.  What should I know about heart disease, diabetes, and high blood pressure?  If you are 48-86 years of age, have your blood pressure checked every 3-5 years. If you are 26 years of age or older, have your blood pressure checked every year. You should have your blood pressure measured twice-once when you are at a hospital or clinic, and once when you are not at a hospital or clinic. Record the average of the two measurements. To check your blood pressure when you are not at a hospital or clinic, you can use: ? An automated blood pressure machine at a pharmacy. ? A home blood pressure monitor.  Talk to your health care provider about your target blood pressure.  If you are between 61-32 years old, ask your health care provider if you should take aspirin to prevent heart disease.  Have regular diabetes screenings by checking your fasting blood sugar level. ? If you are at a normal weight and have a low risk for diabetes, have this test once every three years after the age of 39. ? If you are overweight and have a high risk for diabetes, consider being tested at a younger age or more often.  A one-time screening for abdominal aortic aneurysm (AAA) by ultrasound is recommended for men aged 37-75 years who are current or former smokers. What should I know about preventing infection? Hepatitis B If you have a higher risk for hepatitis B, you should be screened for this virus. Talk with your health care provider to find out if you are at risk for hepatitis B infection. Hepatitis C Blood testing is recommended for:  Everyone born from [birth year] through 1965.  Anyone with known risk factors for hepatitis C.  Sexually Transmitted Diseases (STDs)  You should  be screened each year for STDs including gonorrhea and chlamydia if: ? You are sexually active and are younger than 60 years of age. ? You are older than 60 years of age and your health care provider tells you that you are at risk for this type of infection. ? Your sexual activity has changed since you were last screened and you are at an increased risk for chlamydia or gonorrhea. Ask your health care provider if you are at risk.  Talk with your health care provider about whether you are at high risk of being infected with HIV. Your health care provider may recommend a prescription medicine to help prevent HIV infection.  What else can I do?  Schedule regular health, dental, and eye exams.  Stay current with your vaccines (immunizations).  Do not use any tobacco products, such as cigarettes, chewing tobacco, and e-cigarettes. If you need help  quitting, ask your health care provider.  Limit alcohol intake to no more than 2 drinks per day. One drink equals 12 ounces of beer, 5 ounces of wine, or 1 ounces of hard liquor.  Do not use street drugs.  Do not share needles.  Ask your health care provider for help if you need support or information about quitting drugs.  Tell your health care provider if you often feel depressed.  Tell your health care provider if you have ever been abused or do not feel safe at home. This information is not intended to replace advice given to you by your health care provider. Make sure you discuss any questions you have with your health care provider. Document Released: 11/07/2007 Document Revised: 01/08/2016 Document Reviewed: 02/12/2015 Elsevier Interactive Patient Education  [employer].

## 2016-11-05 ENCOUNTER — Other Ambulatory Visit: Payer: Self-pay | Admitting: Medical

## 2016-11-05 NOTE — Telephone Encounter (Signed)
Relation to ZO:XWRUpt:self Call back [phone] [phone] - [place], [place] - [address] [facility] [address]  Reason for call:  Patient requesting a refill traMADol (ULTRAM) 50 MG tablet

## 2016-11-06 ENCOUNTER — Telehealth: Payer: Self-pay | Admitting: Family

## 2016-11-06 NOTE — Telephone Encounter (Signed)
Notified pt and scheduled appt with Dr Carmelia RollerWendling on Monday, 11/10/16 at 11am.

## 2016-11-06 NOTE — Telephone Encounter (Signed)
Please advise pt that he will need office visit prior to consideration for tramadol refills.

## 2016-11-06 NOTE — Telephone Encounter (Signed)
Refill Request; Tramadol  Last RX:[id number] Last OV:06/15/16 Next OV: None scheduled  ZOX:WRUEDS:None on file CSC: none on file

## 2016-11-06 NOTE — Addendum Note (Signed)
Addended by: Orlene OchRENCE, Shavy Beachem N on: 11/06/2016 08:52 AM   Modules accepted: Orders

## 2016-11-09 ENCOUNTER — Ambulatory Visit (INDEPENDENT_AMBULATORY_CARE_PROVIDER_SITE_OTHER): Payer: Medicare Other | Admitting: Family Medicine

## 2016-11-09 ENCOUNTER — Encounter: Payer: Self-pay | Admitting: Family Medicine

## 2016-11-09 VITALS — BP 90/68 | HR 73 | Temp 98.1°F | Ht 67.0 in | Wt 121.6 lb

## 2016-11-09 DIAGNOSIS — M542 Cervicalgia: Secondary | ICD-10-CM

## 2016-11-09 DIAGNOSIS — K029 Dental caries, unspecified: Secondary | ICD-10-CM

## 2016-11-09 MED ORDER — TRAMADOL HCL 50 MG PO TABS: ORAL_TABLET | ORAL | 1 refills | Status: DC

## 2016-11-09 NOTE — Progress Notes (Signed)
Chief Complaint  Patient presents with  . Follow-up    pt requesting med refill on Tramadol  . Referral to Dentist    [facility]    Subjective: Patient is a 61 y.o. male here for med check.  On tramadol for neck/trap pain. Doing well with medicine, takes on average of 1.5 tabs daily. States he does do stretches/exercises at home. No numbness, tingling or weakness.  He also is requesting for [facility]. He was told he needs referral from our office.   ROS: MSK: +tightness in neck/shoulders  Family History  Problem Relation Age of Onset  . Alzheimer's disease Father   . Diabetes Mother   . Colon cancer Neg Hx   . Prostate cancer Neg Hx    Past Medical History:  Diagnosis Date  . Anxiety   . Anxiety and depression 03/14/2010  . Colonic inertia    with chronic lifelong costipation  . Congenital fusion of cervical spine   . Depression   . FECAL INCONTINENCE 08/19/2007  . H/O: GI bleed   . Heart murmur    early childhood   . Hydrocephalus --per CT 04/2014 07/23/2014  . Idiopathic scoliosis   . 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 Pneumonia    childhood    No Known Allergies  Current Outpatient Prescriptions:  .  traMADol (ULTRAM) 50 MG tablet, take 1 tablet by mouth every 12 hours if needed for MODERATE TO SEVERE PAIN, Disp: 60 tablet, Rfl: 1  Objective: BP 90/68 (BP Location: Right Arm, Patient Position: Sitting, Cuff Size: Normal)   Pulse 73   Temp 98.1 F (36.7 C) (Oral)   Ht 5\' 7"  (1.702 m)   Wt 121 lb 9.6 oz (55.2 kg)   SpO2 98%   BMI 19.05 kg/m  General: Awake, appears stated age HEENT: numerous missing teeth, +widespread dental decay Heart: RRR, no murmurs Lungs: CTAB, no rales, wheezes or rhonchi. No accessory muscle use MSK: 5/5 strength b/l UE's, mild TTP over medial traps b/l, minimal tightness over cervical paraspinal musculature Neuro: 2/4 biceps reflex b/l, no clonus, neg Spurling's Psych: Age  appropriate judgment and insight, normal affect and mood  Assessment and Plan: Neck pain - Plan: traMADol (ULTRAM) 50 MG tablet  Tooth decay - Plan: Ambulatory referral to Dentistry  Will refill tramadol on behalf of reg PCP. Pt instructed he needed referral to dentistry from PCP's office. OK. F/u with reg PCP in 3 mo. The patient voiced understanding and agreement to the plan.  Jilda Rocheicholas Paul RioWendling, DO 11/09/16  11:24 AM

## 2016-11-09 NOTE — Patient Instructions (Signed)
If you do not hear anything about your referral in the next 1-2 weeks, call our office and ask for an update.  Heat (pad or rice pillow in microwave) over affected area, 10-15 minutes every 2-3 hours while awake.   [facility] Ask your health care provider which exercises are safe for you. Do exercises exactly as told by your health care provider and adjust them as directed. It is normal to feel mild stretching, pulling, tightness, or discomfort as you do these exercises, but you should stop right away if you feel sudden pain or your pain gets worse.Do not begin these exercises until told by your health care provider. Stretching and range of motion exercises These exercises warm up your muscles and joints and improve the movement and flexibility of your shoulder. These exercises can also help to relieve pain, numbness, and tingling. If you are unable to do any of the following for any reason, do not further attempt to do it.  Exercise A: Flexion, standing    1. Stand and hold a broomstick, a cane, or a similar object. Place your hands a little more than shoulder-width apart on the object. Your left / right hand should be palm-up, and your other hand should be palm-down. 2. Push the stick to raise your left / right arm out to your side and then over your head. Use your other hand to help move the stick. Stop when you feel a stretch in your shoulder, or when you reach the angle that is recommended by your health care provider. ? Avoid shrugging your shoulder while you raise your arm. Keep your shoulder blade tucked down toward your spine. 3. Hold for 10-15 seconds. 4. Slowly return to the starting position. Repeat 2-3 times. Complete this exercise 1 time a day.  Exercise B: Abduction, supine    1. Lie on your back and hold a broomstick, a cane, or a similar object. Place your hands a little more than shoulder-width apart on the object. Your left / right hand should be palm-up, and your  other hand should be palm-down. 2. Push the stick to raise your left / right arm out to your side and then over your head. Use your other hand to help move the stick. Stop when you feel a stretch in your shoulder, or when you reach the angle that is recommended by your health care provider. ? Avoid shrugging your shoulder while you raise your arm. Keep your shoulder blade tucked down toward your spine. 3. Hold for 10-15 seconds. 4. Slowly return to the starting position. Repeat 2-3 times. Complete this exercise 1 time  a day.  Exercise C: Flexion, active-assisted    1. Lie on your back. You may bend your knees for comfort. 2. Hold a broomstick, a cane, or a similar object. Place your hands about shoulder-width apart on the object. Your palms should face toward your feet. 3. Raise the stick and move your arms over your head and behind your head, toward the floor. Use your healthy arm to help your left / right arm move farther. Stop when you feel a gentle stretch in your shoulder, or when you reach the angle where your health care provider tells you to stop. 4. Hold for 10-15 seconds. 5. Slowly return to the starting position. Repeat 2-3 times. Complete this exercise 1 time  a day.  Exercise D: External rotation and abduction    1. Stand in a door frame with one of your feet slightly in  front of the other. This is called a staggered stance. 2. Choose one of the following positions as told by your health care provider: ? Place your hands and forearms on the door frame above your head. ? Place your hands and forearms on the door frame at the height of your head. ? Place your hands on the door frame at the height of your elbows. 3. Slowly move your weight onto your front foot until you feel a stretch across your chest and in the front of your shoulders. Keep your head and chest upright and keep your abdominal muscles tight. 4. Hold for 10-15 seconds. 5. To release the stretch, shift your  weight to your back foot. Repeat 2-3 times. Complete this stretch 1 time  a day.  Strengthening exercises These exercises build strength and endurance in your shoulder. Endurance is the ability to use your muscles for a long time, even after your muscles get tired. Exercise E: Scapular depression and adduction  1. Sit on a stable chair. Support your arms in front of you with pillows, armrests, or a tabletop. Keep your elbows in line with the sides of your body. 2. Gently move your shoulder blades down toward your middle back. Relax the muscles on the tops of your shoulders and in the back of your neck. 3. Hold for 10-15 seconds. 4. Slowly release the tension and relax your muscles completely before doing this exercise again. 5. After you have practiced this exercise, try doing the exercise without the arm support. Then, try the exercise while standing instead of sitting. Repeat 2-3 times. Complete this exercise 1 time  a day.  Exercise F: Shoulder abduction, isometric    1. Stand or sit about 4-6 inches (10-15 cm) from a wall with your left / right side facing the wall. 2. Bend your left / right elbow and gently press your elbow against the wall. 3. Increase the pressure slowly until you are pressing as hard as you can without shrugging your shoulder. 4. Hold for 10-15 seconds. 5. Slowly release the tension and relax your muscles completely. Repeat 2-3 times. Complete this exercise 1 time  a day.  Exercise G: Shoulder flexion, isometric    1. Stand or sit about 4-6 inches (10-15 cm) away from a wall with your left / right side facing the wall. 2. Keep your left / right elbow straight and gently press the top of your fist against the wall. Increase the pressure slowly until you are pressing as hard as you can without shrugging your shoulder. 3. Hold for 10-15 seconds. 4. Slowly release the tension and relax your muscles completely. Repeat 2-3 times. Complete this exercise 1 time  a  day.  Exercise H: Internal rotation    1. Sit in a stable chair without armrests, or stand. Secure an exercise band at your left / right side, at elbow height. 2. Place a soft object, such as a folded towel or a small pillow, under your left / right upper arm so your elbow is a few inches (about 8 cm) away from your side. 3. Hold the end of the exercise band so the band stretches. 4. Keeping your elbow pressed against the soft object under your arm, move your forearm across your body toward your abdomen. Keep your body steady so the movement is only coming from your shoulder. 5. Hold for 10-15 seconds. 6. Slowly return to the starting position. Repeat 2-3 times. Complete this exercise 1 time  a day.  Exercise I: External rotation    1. Sit in a stable chair without armrests, or stand. 2. Secure an exercise band at your left / right side, at elbow height. 3. Place a soft object, such as a folded towel or a small pillow, under your left / right upper arm so your elbow is a few inches (about 8 cm) away from your side. 4. Hold the end of the exercise band so the band stretches. 5. Keeping your elbow pressed against the soft object under your arm, move your forearm out, away from your abdomen. Keep your body steady so the movement is only coming from your shoulder. 6. Hold for 10-15 seconds. 7. Slowly return to the starting position. Repeat 2-3 times. Complete this exercise 1 time  a day. Exercise J: Shoulder extension  1. Sit in a stable chair without armrests, or stand. Secure an exercise band to a stable object in front of you so the band is at shoulder height. 2. Hold one end of the exercise band in each hand. Your palms should face each other. 3. Straighten your elbows and lift your hands up to shoulder height. 4. Step back, away from the secured end of the exercise band, until the band stretches. 5. Squeeze your shoulder blades together and pull your hands down to the sides of your  thighs. Stop when your hands are straight down by your sides. Do not let your hands go behind your body. 6. Hold for 10-15 seconds. 7. Slowly return to the starting position. Repeat 2-3 times. Complete this exercise 1 time  a day.  Exercise K: Shoulder extension, prone    1. Lie on your abdomen on a firm surface so your left / right arm hangs over the edge. 2. Hold a 5 lb weight in your hand so your palm faces in toward your body. Your arm should be straight. 3. Squeeze your shoulder blade down toward the middle of your back. 4. Slowly raise your arm behind you, up to the height of the surface that you are lying on. Keep your arm straight. 5. Hold for 10-15 seconds. 6. Slowly return to the starting position and relax your muscles. Repeat 2-3 times. Complete this exercise 1 time  a day.  Exercise L: Horizontal abduction, prone  1. Lie on your abdomen on a firm surface so your left / right arm hangs over the edge. 2. Hold a 5 lb weight in your hand so your palm faces toward your feet. Your arm should be straight. 3. Squeeze your shoulder blade down toward the middle of your back. 4. Bend your elbow so your hand moves up, until your elbow is bent to an "L" shape (90 degrees). With your elbow bent, slowly move your forearm forward and up. Raise your hand up to the height of the surface that you are lying on. ? Your upper arm should not move, and your elbow should stay bent. ? At the top of the movement, your palm should face the floor. 5. Hold for 10-15 seconds. 6. Slowly return to the starting position and relax your muscles. Repeat 2-3 times. Complete this exercise 1 time a day.  Exercise M: Horizontal abduction, standing  1. Sit on a stable chair, or stand. 2. Secure an exercise band to a stable object in front of you so the band is at shoulder height. 3. Hold one end of the exercise band in each hand. 4. Straighten your elbows and lift your hands straight in front of  you, up to  shoulder height. Your palms should face down, toward the floor. 5. Step back, away from the secured end of the exercise band, until the band stretches. 6. Move your arms out to your sides, and keep your arms straight. 7. Hold for 10-15 seconds. 8. Slowly return to the starting position. Repeat 2-3 times. Complete this exercise 1 time a day.  Exercise N: Scapular retraction and elevation  1. Sit on a stable chair, or stand. 2. Secure an exercise band to a stable object in front of you so the band is at shoulder height. 3. Hold one end of the exercise band in each hand. Your palms should face each other. 4. Sit in a stable chair without armrests, or stand. 5. Step back, away from the secured end of the exercise band, until the band stretches. 6. Squeeze your shoulder blades together and lift your hands over your head. Keep your elbows straight. 7. Hold for 10-15 seconds. 8. Slowly return to the starting position. Repeat 3-4 times. Complete this exercise 1-2 times a day.  This information is not intended to replace advice given to you by your health care provider. Make sure you discuss any questions you have with your health care provider. Document Released: 05/11/2005 Document Revised: 01/16/2016 Document Reviewed: 03/28/2015 Elsevier Interactive Patient Education  2017 [employer].

## 2016-12-09 ENCOUNTER — Ambulatory Visit (INDEPENDENT_AMBULATORY_CARE_PROVIDER_SITE_OTHER): Payer: Medicare Other | Admitting: Medical

## 2016-12-09 ENCOUNTER — Encounter: Payer: Self-pay | Admitting: Medical

## 2016-12-09 VITALS — BP 102/62 | HR 75 | Temp 98.2°F | Resp 16 | Ht 67.0 in | Wt 122.6 lb

## 2016-12-09 DIAGNOSIS — Z125 Encounter for screening for malignant neoplasm of prostate: Secondary | ICD-10-CM | POA: Diagnosis not present

## 2016-12-09 DIAGNOSIS — Z1211 Encounter for screening for malignant neoplasm of colon: Secondary | ICD-10-CM

## 2016-12-09 DIAGNOSIS — R5383 Other fatigue: Secondary | ICD-10-CM

## 2016-12-09 LAB — COMPREHENSIVE METABOLIC PANEL
ALBUMIN: 4.5 g/dL (ref 3.5–5.2)
ALT: 7 U/L (ref 0–53)
AST: 15 U/L (ref 0–37)
Alkaline Phosphatase: 53 U/L (ref 39–117)
BUN: 13 mg/dL (ref 6–23)
CHLORIDE: 104 meq/L (ref 96–112)
CO2: 29 mEq/L (ref 19–32)
CREATININE: 0.97 mg/dL (ref 0.40–1.50)
Calcium: 9.5 mg/dL (ref 8.4–10.5)
GFR: 83.92 mL/min (ref >60.00)
GLUCOSE: 87 mg/dL (ref 70–99)
Potassium: 3.9 mEq/L (ref 3.5–5.1)
SODIUM: 141 meq/L (ref 135–145)
TOTAL PROTEIN: 6.5 g/dL (ref 6.0–8.3)
Total Bilirubin: 0.4 mg/dL (ref 0.2–1.2)

## 2016-12-09 LAB — CBC WITH DIFFERENTIAL/PLATELET
Basophils Absolute: 0.1 10*3/uL (ref 0.0–0.1)
Basophils Relative: 1.3 % (ref 0.0–3.0)
EOS ABS: 0.2 10*3/uL (ref 0.0–0.7)
Eosinophils Relative: 3.9 % (ref 0.0–5.0)
HCT: 42.7 % (ref 39.0–52.0)
Hemoglobin: 14.2 g/dL (ref 13.0–17.0)
LYMPHS ABS: 1.4 10*3/uL (ref 0.7–4.0)
Lymphocytes Relative: 27.8 % (ref 12.0–46.0)
MCHC: 33.2 g/dL (ref 30.0–36.0)
MCV: 84.4 fl (ref 78.0–100.0)
MONO ABS: 0.3 10*3/uL (ref 0.1–1.0)
Monocytes Relative: 6.4 % (ref 3.0–12.0)
NEUTROS ABS: 3 10*3/uL (ref 1.4–7.7)
NEUTROS PCT: 60.6 % (ref 43.0–77.0)
PLATELETS: 243 10*3/uL (ref 150.0–400.0)
RBC: 5.06 Mil/uL (ref 4.22–5.81)
RDW: 14.1 % (ref 11.5–15.5)
WBC: 4.9 10*3/uL (ref 4.0–10.5)

## 2016-12-09 LAB — VITAMIN B12: VITAMIN B 12: 289 pg/mL (ref 211–911)

## 2016-12-09 LAB — TSH: TSH: 1.76 u[IU]/mL (ref 0.35–4.50)

## 2016-12-09 LAB — PSA: PSA: 0.19 ng/mL (ref 0.10–4.00)

## 2016-12-09 NOTE — Progress Notes (Signed)
Subjective:    Patient ID: [name], male    DOB: [birth year], 61 y.o.   MRN: [id number]  HPI   Pt in states he has been fatigued for 3 days.   He state no excess work or outdoor acitivities. No fever, no chills or sweats. No cough. No diarrha or abdomen pain.  Not urinary symptoms.  Pt wait fluctuates in the past. Has been lower than usual.  He does explain works part time 3 days a week. 8 hour day. Work in [employer]. It is not air conditioned. Pt feels like he does not sweat a lot at work.  Pt last GI study was in 2008 with Dr. Dickie La.   Psa checked but last in 2016. Pt is non smoker.      Review of Systems  Constitutional: Positive for fatigue. Negative for chills, diaphoresis and fever.  HENT: Negative for congestion, ear pain, rhinorrhea, sinus pain, sinus pressure and sore throat.   Respiratory: Negative for cough, chest tightness, shortness of breath and wheezing.   Cardiovascular: Negative for chest pain and palpitations.  Gastrointestinal: Negative for abdominal distention, abdominal pain, nausea and vomiting.  Genitourinary: Negative for dysuria, frequency, hematuria, scrotal swelling, testicular pain and urgency.  Musculoskeletal: Negative for back pain and myalgias.  Skin: Negative for rash.  Neurological: Negative for dizziness and headaches.  Hematological: Negative for adenopathy. Does not bruise/bleed easily.  Psychiatric/Behavioral: Negative for behavioral problems, confusion, sleep disturbance and suicidal ideas.   Past Medical History:  Diagnosis Date  . Anxiety   . Anxiety and depression 03/14/2010  . Colonic inertia    with chronic lifelong costipation  . Congenital fusion of cervical spine   . Depression   . FECAL INCONTINENCE 08/19/2007  . H/O: GI bleed   . Heart murmur    early childhood   . Hydrocephalus --per CT 04/2014 07/23/2014  . Idiopathic scoliosis   . 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 Pneumonia    childhood      Social  History   Social History  . Marital status: Single    Spouse name: N/A  . Number of children: 0  . Years of education: N/A   Occupational History  . 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 Smoking status: Never Smoker  . Smokeless tobacco: Never Used  . Alcohol use No  . Drug use: No  . Sexual activity: Yes   Other Topics Concern  . Not on file   Social History Narrative   Lives w/ mother , has a younger brother and sister     Past Surgical History:  Procedure Laterality Date  . COLONOSCOPY  11/08/2006  . HARDWARE REMOVAL Right 10/23/2015   Procedure: RIGHT KNEE HARDWARE REMOVAL;  Surgeon: Ollen Gross, MD;  Location: [facility];  Service: Orthopedics;  Laterality: Right;  LMA  . HERNIA REPAIR  1980   left inguinal hernia  . LAPAROTOMY  07/18/2011   Procedure: EXPLORATORY LAPAROTOMY;  Surgeon: Mariella Saa, MD;  Location: [facility];  Service: General;  Laterality: N/A;  lysis of adhesions entero enterostomy  . LAPAROTOMY  07/28/2011   Procedure: EXPLORATORY LAPAROTOMY;  Surgeon: Mariella Saa, MD;  Location: [facility];  Service: General;  Laterality: N/A;  . Left Knee surgery  1994, 1992  . ORIF PATELLA Right 12/14/2014   Procedure: OPEN REDUCTION INTERNAL (ORIF) FIXATION PATELLA;  Surgeon: Ollen Gross, MD;  Location: [facility];  Service: Orthopedics;  Laterality:  Right;  . Surgery for imperforate anus  1958  . TONSILLECTOMY    . UPPER GASTROINTESTINAL ENDOSCOPY  05/15/03    Family History  Problem Relation Age of Onset  . Alzheimer's disease Father   . Diabetes Mother   . Colon cancer Neg Hx   . Prostate cancer Neg Hx     No Known Allergies  Current Outpatient Prescriptions on File Prior to Visit  Medication Sig Dispense Refill  . traMADol (ULTRAM) 50 MG tablet take 1 tablet by mouth every 12 hours if needed for MODERATE TO SEVERE PAIN 60 tablet 1   No current facility-administered medications on file prior to visit.      BP 102/62   Pulse 75   Temp 98.2 F (36.8 C) (Oral)   Resp 16   Ht 5\' 7"  (1.702 m)   Wt 122 lb 9.6 oz (55.6 kg)   SpO2 99%   BMI 19.20 kg/m       Objective:   Physical Exam  General Mental Status- Alert. General Appearance- Not in acute distress.   Skin General: Color- Normal Color. Moisture- Normal Moisture.  Neck Carotid Arteries- Normal color. Moisture- Normal Moisture. No carotid bruits.no thyromegaly,  No JVD.  Chest and Lung Exam Auscultation: Breath Sounds:-Normal.  Cardiovascular Auscultation:Rythm- Regular. Murmurs & Other Heart Sounds:Auscultation of the heart reveals- No Murmurs.  Abdomen Inspection:-Inspeection Normal. Palpation/Percussion:Note:No mass. Palpation and Percussion of the abdomen reveal- Non Tender, Non Distended + BS, no rebound or guarding.   Neurologic Cranial Nerve exam:- CN III-XII intact(No nystagmus), symmetric smile. Strength:- 5/5 equal and symmetric strength both upper and lower extremities.     Assessment & Plan:  For recent fatigue I want you to make sure you stay hydrated at work. Recommend propel fitness water. 1-2 bottles on day you work. However will also do lab work up to determine potential cause. Cbc, cmp, tsh, b1 and b12 level.  Also screening psa today as well.  We will let you know results when they are in.  Follow up to be determined based on lab results and based on how your are doing.   [place], Ramon DredgeEdward, PA-C

## 2016-12-09 NOTE — Patient Instructions (Addendum)
For recent fatigue I want you to make sure you stay hydrated at work. Recommend propel fitness water. 1-2 bottles on day you work. However will also do lab work up to determine potential cause. Cbc, cmp, tsh, b1 and b12 level.  Also screening psa today as well.  We will let you know results when they are in.  Follow up to be determined based on lab results and based how you are doing.

## 2016-12-11 ENCOUNTER — Telehealth: Payer: Self-pay | Admitting: Medical

## 2016-12-11 NOTE — Telephone Encounter (Signed)
Pt called in. He would like to have his lab results mailed to him at the address on file.

## 2016-12-14 LAB — VITAMIN B1: Vitamin B1 (Thiamine): 13 nmol/L (ref 8–30)

## 2016-12-14 NOTE — Telephone Encounter (Signed)
Results mailed 

## 2017-01-01 ENCOUNTER — Telehealth: Payer: Self-pay | Admitting: Medical

## 2017-01-01 NOTE — Telephone Encounter (Signed)
Patient called in and asked could you prescribe an anti-diarrhea medication. He did not want to come in to be assessed he asked if I would ask provider first.

## 2017-01-01 NOTE — Telephone Encounter (Signed)
Without assessment in office can only recommend immodium otc for his diarrhea. Recommend hydrate well with propel fitness water. If diarrhea persist by Monday then recommend office visit. He may need stool panel studies and rx med.

## 2017-01-01 NOTE — Telephone Encounter (Signed)
Opened to review 

## 2017-01-04 ENCOUNTER — Encounter: Payer: Self-pay | Admitting: Medical

## 2017-01-04 ENCOUNTER — Ambulatory Visit (INDEPENDENT_AMBULATORY_CARE_PROVIDER_SITE_OTHER): Payer: Medicare Other | Admitting: Medical

## 2017-01-04 VITALS — BP 103/67 | HR 57 | Temp 97.7°F | Resp 16 | Ht 67.0 in | Wt 117.6 lb

## 2017-01-04 DIAGNOSIS — R197 Diarrhea, unspecified: Secondary | ICD-10-CM

## 2017-01-04 MED ORDER — DIPHENOXYLATE-ATROPINE 2.5-0.025 MG PO TABS: ORAL_TABLET | ORAL | 0 refills | Status: DC

## 2017-01-04 NOTE — Progress Notes (Signed)
Subjective:    Patient ID: [name], male    DOB: 1955-06-03, 61 y.o.   MRN: [id number]  HPI  Pt in for some recent severe diarrhea. Pt states in past about every 2 years he will get diarrhea. He state typically needs prescriptions strength med to resolve. No fevers, no chills or sweats.   No recent antibiotics. No known close contact with person with GI illness.  Pt states having about 10 loose stools a day.  No severe stomach pain. No preceding constipation before loose stool.   Hx of small bowel obstruction.    Review of Systems  Constitutional: Negative for chills, diaphoresis and fatigue.  Respiratory: Negative for cough, choking, chest tightness, shortness of breath and wheezing.   Cardiovascular: Negative for chest pain and palpitations.  Gastrointestinal: Positive for abdominal pain and diarrhea. Negative for abdominal distention, blood in stool, constipation, nausea and vomiting.  Musculoskeletal: Negative for back pain.  Skin: Negative for rash.  Neurological: Negative for dizziness, seizures, weakness, light-headedness and numbness.  Hematological: Negative for adenopathy. Does not bruise/bleed easily.  Psychiatric/Behavioral: Negative for behavioral problems and confusion.   Past Medical History:  Diagnosis Date  . Anxiety   . Anxiety and depression 03/14/2010  . Colonic inertia    with chronic lifelong costipation  . Congenital fusion of cervical spine   . Depression   . FECAL INCONTINENCE 08/19/2007  . H/O: GI bleed   . Heart murmur    early childhood   . Hydrocephalus --per CT 04/2014 07/23/2014  . Idiopathic scoliosis   . 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 Pneumonia    childhood      Social History   Social History  . Marital status: Single    Spouse name: N/A  . Number of children: 0  . Years of education: N/A   Occupational History  . 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 Smoking status: Never  Smoker  . Smokeless tobacco: Never Used  . Alcohol use No  . Drug use: No  . Sexual activity: Yes   Other Topics Concern  . Not on file   Social History Narrative   Lives w/ mother , has a younger brother and sister     Past Surgical History:  Procedure Laterality Date  . COLONOSCOPY  11/08/2006  . HARDWARE REMOVAL Right 10/23/2015   Procedure: RIGHT KNEE HARDWARE REMOVAL;  Surgeon: Ollen Gross, MD;  Location: [facility];  Service: Orthopedics;  Laterality: Right;  LMA  . HERNIA REPAIR  1980   left inguinal hernia  . LAPAROTOMY  07/18/2011   Procedure: EXPLORATORY LAPAROTOMY;  Surgeon: Mariella Saa, MD;  Location: [facility];  Service: General;  Laterality: N/A;  lysis of adhesions entero enterostomy  . LAPAROTOMY  07/28/2011   Procedure: EXPLORATORY LAPAROTOMY;  Surgeon: Mariella Saa, MD;  Location: [facility];  Service: General;  Laterality: N/A;  . Left Knee surgery  1994, 1992  . ORIF PATELLA Right 12/14/2014   Procedure: OPEN REDUCTION INTERNAL (ORIF) FIXATION PATELLA;  Surgeon: Ollen Gross, MD;  Location: [facility];  Service: Orthopedics;  Laterality: Right;  . Surgery for imperforate anus  1958  . TONSILLECTOMY    . UPPER GASTROINTESTINAL ENDOSCOPY  05/15/03    Family History  Problem Relation Age of Onset  . Alzheimer's disease Father   . Diabetes Mother   . Colon cancer Neg Hx   . Prostate cancer Neg Hx  No Known Allergies  Current Outpatient Prescriptions on File Prior to Visit  Medication Sig Dispense Refill  . traMADol (ULTRAM) 50 MG tablet take 1 tablet by mouth every 12 hours if needed for MODERATE TO SEVERE PAIN 60 tablet 1   No current facility-administered medications on file prior to visit.     BP 103/67   Pulse (!) 57   Temp 97.7 F (36.5 C) (Oral)   Resp 16   Ht 5\' 7"  (1.702 m)   Wt 117 lb 9.6 oz (53.3 kg)   SpO2 100%   BMI 18.42 kg/m       Objective:   Physical Exam  General Appearance- Not in acute distress.  HEENT Eyes-  Scleraeral/Conjuntiva-bilat- Not Yellow. Mouth & Throat- Normal.  Chest and Lung Exam Auscultation: Breath sounds:-Normal. Adventitious sounds:- No Adventitious sounds.  Cardiovascular Auscultation:Rythm - Regular. Heart Sounds -Normal heart sounds.  Abdomen Inspection:-Inspection Normal.  Palpation/Perucssion: Palpation and Percussion of the abdomen reveal- Non Tender, No Rebound tenderness, No rigidity(Guarding) and No Palpable abdominal masses.  Liver:-Normal.  Spleen:- Normal.   Back- no cva tenderness.      Assessment & Plan:  For diarrhea rx of lomotil(propel hydration and bland foods). Cause may be viral. With your history we need  to be cautious if by wednesday your loose stools persist notify us and will have you do stool panel and get labs.   If you get abdomen or constipation please let us know as well.  Follow up 7 days or as needed  Siyah Mault, Ramon DredgeEdward, VF CorporationPA-C

## 2017-01-04 NOTE — Telephone Encounter (Signed)
Pt has been scheduled.  °

## 2017-01-04 NOTE — Patient Instructions (Addendum)
For diarrhea rx of lomotil (propel hydration and bland foodsl. Cause may be viral. With your history we need  to be cautious if by wednesday your loose stools persist notify us and will have you do stool panel and get labs.  If you get abdomen or constipation please let us know as well.  Follow up 7 days or as needed

## 2017-01-11 ENCOUNTER — Other Ambulatory Visit: Payer: Self-pay | Admitting: Medical

## 2017-01-11 DIAGNOSIS — M542 Cervicalgia: Secondary | ICD-10-CM

## 2017-01-11 NOTE — Telephone Encounter (Signed)
Caller name: Elster Peterson Relationship to patient: self Can be reached: [phone] Pharmacy: [facility] [address] [place], [place] - [address]  Reason for call: pt called for refill on Tramadol. Takes 2/day. Has 1 1/2 days left. Pt requesting call to p/u RX or notify it has been sent to pharmacy.

## 2017-01-13 ENCOUNTER — Ambulatory Visit (INDEPENDENT_AMBULATORY_CARE_PROVIDER_SITE_OTHER): Payer: Medicare Other | Admitting: Medical

## 2017-01-13 ENCOUNTER — Ambulatory Visit (HOSPITAL_BASED_OUTPATIENT_CLINIC_OR_DEPARTMENT_OTHER)
Admission: RE | Admit: 2017-01-13 | Discharge: 2017-01-13 | Disposition: A | Discharge status: Home / Self Care | Payer: Medicare Other | Source: Ambulatory Visit | Attending: Medical | Admitting: Medical

## 2017-01-13 ENCOUNTER — Encounter: Payer: Self-pay | Admitting: Medical

## 2017-01-13 VITALS — BP 99/65 | HR 93 | Temp 98.0°F | Resp 16 | Ht 67.0 in | Wt 117.4 lb

## 2017-01-13 DIAGNOSIS — R197 Diarrhea, unspecified: Secondary | ICD-10-CM

## 2017-01-13 DIAGNOSIS — R195 Other fecal abnormalities: Secondary | ICD-10-CM | POA: Insufficient documentation

## 2017-01-13 DIAGNOSIS — Z8719 Personal history of other diseases of the digestive system: Secondary | ICD-10-CM | POA: Insufficient documentation

## 2017-01-13 LAB — CBC WITH DIFFERENTIAL/PLATELET
BASOS PCT: 1.3 % (ref 0.0–3.0)
Basophils Absolute: 0.1 10*3/uL (ref 0.0–0.1)
EOS ABS: 0.1 10*3/uL (ref 0.0–0.7)
EOS PCT: 1.7 % (ref 0.0–5.0)
HEMATOCRIT: 41.7 % (ref 39.0–52.0)
Hemoglobin: 13.8 g/dL (ref 13.0–17.0)
Lymphocytes Relative: 22.1 % (ref 12.0–46.0)
Lymphs Abs: 1 10*3/uL (ref 0.7–4.0)
MCHC: 33.1 g/dL (ref 30.0–36.0)
MCV: 84.2 fl (ref 78.0–100.0)
MONO ABS: 0.3 10*3/uL (ref 0.1–1.0)
Monocytes Relative: 6.1 % (ref 3.0–12.0)
NEUTROS ABS: 3.2 10*3/uL (ref 1.4–7.7)
NEUTROS PCT: 68.8 % (ref 43.0–77.0)
PLATELETS: 230 10*3/uL (ref 150.0–400.0)
RBC: 4.96 Mil/uL (ref 4.22–5.81)
RDW: 14.4 % (ref 11.5–15.5)
WBC: 4.7 10*3/uL (ref 4.0–10.5)

## 2017-01-13 MED ORDER — TRAMADOL HCL 50 MG PO TABS: ORAL_TABLET | ORAL | 1 refills | Status: DC

## 2017-01-13 NOTE — Telephone Encounter (Signed)
Last RX: 11/09/16 Last OV:01/04/17 Next OV:02/12/17 UDS:06/15/16 CSC:06/15/16

## 2017-01-13 NOTE — Progress Notes (Signed)
Subjective:    Patient ID: [name], male    DOB: 07/16/1955, 61 y.o.   MRN: [id number]  HPI   Pt in for follow up(please see prior note)  Today patient states his stools are not back to normal. He states stool are not watery. But states on loose side(7-8 a day). Previously had bulky formed stools. No fever, no chills or sweats.   He states while on lomotil he did well but afterwards stool still little loose. Symptoms still for 2 weeks.  Pt denies any abdomen pain. No vomiting and no nausea. Not feeling bloated. No belching.  Pt in past has had sbo and surgery for this in past.   Pt has seen Dr. Juanda Chance in the past. Pt has hx of colonic inertia, imperforate anus, surgery/neorectum and obstruction with subsequent surgery in 2013.      Review of Systems  Constitutional: Negative for chills, fatigue and fever.  HENT: Negative for congestion and drooling.   Respiratory: Negative for cough, chest tightness and shortness of breath.   Cardiovascular: Negative for chest pain and palpitations.  Gastrointestinal: Positive for diarrhea. Negative for abdominal distention, anal bleeding, blood in stool, constipation, nausea, rectal pain and vomiting.       More loose stools/ see history of present illness.  Genitourinary: Negative for decreased urine volume, discharge, dysuria, flank pain, penile pain, scrotal swelling and testicular pain.  Musculoskeletal: Negative for back pain.  Neurological: Negative for dizziness, tremors, seizures, weakness and light-headedness.  Hematological: Negative for adenopathy. Does not bruise/bleed easily.  Psychiatric/Behavioral: Negative for behavioral problems, confusion and sleep disturbance. The patient is not nervous/anxious.     Past Medical History:  Diagnosis Date  . Anxiety   . Anxiety and depression 03/14/2010  . Colonic inertia    with chronic lifelong costipation  . Congenital fusion of cervical spine   . Depression   . FECAL  INCONTINENCE 08/19/2007  . H/O: GI bleed   . Heart murmur    early childhood   . Hydrocephalus --per CT 04/2014 07/23/2014  . Idiopathic scoliosis   . 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 Pneumonia    childhood      Social History   Social History  . Marital status: Single    Spouse name: N/A  . Number of children: 0  . Years of education: N/A   Occupational History  . 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 Smoking status: Never Smoker  . Smokeless tobacco: Never Used  . Alcohol use No  . Drug use: No  . Sexual activity: Yes   Other Topics Concern  . Not on file   Social History Narrative   Lives w/ mother , has a younger brother and sister     Past Surgical History:  Procedure Laterality Date  . COLONOSCOPY  11/08/2006  . HARDWARE REMOVAL Right 10/23/2015   Procedure: RIGHT KNEE HARDWARE REMOVAL;  Surgeon: Ollen Gross, MD;  Location: [facility];  Service: Orthopedics;  Laterality: Right;  LMA  . HERNIA REPAIR  1980   left inguinal hernia  . LAPAROTOMY  07/18/2011   Procedure: EXPLORATORY LAPAROTOMY;  Surgeon: Mariella Saa, MD;  Location: [facility];  Service: General;  Laterality: N/A;  lysis of adhesions entero enterostomy  . LAPAROTOMY  07/28/2011   Procedure: EXPLORATORY LAPAROTOMY;  Surgeon: Mariella Saa, MD;  Location: [facility];  Service: General;  Laterality: N/A;  . Left Knee surgery  1994, 1992  . ORIF PATELLA Right 12/14/2014   Procedure: OPEN REDUCTION INTERNAL (ORIF) FIXATION PATELLA;  Surgeon: Ollen Gross, MD;  Location: [facility];  Service: Orthopedics;  Laterality: Right;  . Surgery for imperforate anus  1958  . TONSILLECTOMY    . UPPER GASTROINTESTINAL ENDOSCOPY  05/15/03    Family History  Problem Relation Age of Onset  . Alzheimer's disease Father   . Diabetes Mother   . Colon cancer Neg Hx   . Prostate cancer Neg Hx     No Known Allergies  Current Outpatient Prescriptions on File Prior to Visit    Medication Sig Dispense Refill  . diphenoxylate-atropine (LOMOTIL) 2.5-0.025 MG tablet 1-2 tab po qid as needed diarrhea 16 tablet 0  . traMADol (ULTRAM) 50 MG tablet take 1 tablet by mouth every 12 hours if needed for MODERATE TO SEVERE PAIN 60 tablet 1   No current facility-administered medications on file prior to visit.     BP 99/65   Pulse 93   Temp 98 F (36.7 C) (Oral)   Resp 16   Ht 5\' 7"  (1.702 m)   Wt 117 lb 6.4 oz (53.3 kg)   SpO2 99%   BMI 18.39 kg/m       Objective:   Physical Exam  General Appearance- Not in acute distress.  HEENT Eyes- Scleraeral/Conjuntiva-bilat- Not Yellow. Mouth & Throat- Normal.  Chest and Lung Exam Auscultation: Breath sounds:-Normal. Adventitious sounds:- No Adventitious sounds.  Cardiovascular Auscultation:Rythm - Regular. Heart Sounds -Normal heart sounds.  Abdomen Inspection:-Inspection Normal.  Palpation/Perucssion: Palpation and Percussion of the abdomen reveal- Non Tender, No Rebound tenderness, No rigidity(Guarding) and No Palpable abdominal masses.  Liver:-Normal.  Spleen:- Normal.         Assessment & Plan:  For persisting loose stools will get abdomen x-ray, CBC, CMP and stool panel studies.  For present use metamucil 1 rounded table spoon in 8 oz of water 3 times daily. Avoid immodium presently pending lab review.  After labs and imaging back may refer to GI to get opinion on meds you can use in light of your GI history.  Follow up in 7 days or as needed

## 2017-01-13 NOTE — Patient Instructions (Signed)
For persisting loose stools will get abdomen x-ray, CBC, CMP and stool panel studies.  For present use metamucil 1 rounded table spoon in 8 oz of water 3 times daily. Avoid immodium presently pending lab review.  After labs and imaging back may refer to GI to get opinion on meds you can use in light of your GI history.  Follow up in 7 days or as needed

## 2017-01-13 NOTE — Telephone Encounter (Signed)
Rx faxed to pharmacy  

## 2017-01-14 ENCOUNTER — Telehealth: Payer: Self-pay | Admitting: Medical

## 2017-01-14 LAB — COMPLETE METABOLIC PANEL WITH GFR
ALBUMIN: 4.4 g/dL (ref 3.6–5.1)
ALK PHOS: 52 U/L (ref 40–115)
ALT: 8 U/L — ABNORMAL LOW (ref 9–46)
AST: 18 U/L (ref 10–35)
BILIRUBIN TOTAL: 0.4 mg/dL (ref 0.2–1.2)
BUN: 14 mg/dL (ref 7–25)
CO2: 28 mmol/L (ref 20–32)
Calcium: 9.5 mg/dL (ref 8.6–10.3)
Chloride: 103 mmol/L (ref 98–110)
Creat: 0.89 mg/dL (ref 0.70–1.25)
GLUCOSE: 66 mg/dL (ref 65–99)
Potassium: 4.7 mmol/L (ref 3.5–5.3)
Sodium: 140 mmol/L (ref 135–146)
TOTAL PROTEIN: 6.5 g/dL (ref 6.1–8.1)

## 2017-01-14 NOTE — Telephone Encounter (Signed)
Pt returned call for lab results.

## 2017-01-15 NOTE — Telephone Encounter (Signed)
Notified pt. 

## 2017-02-10 ENCOUNTER — Ambulatory Visit (HOSPITAL_BASED_OUTPATIENT_CLINIC_OR_DEPARTMENT_OTHER)
Admission: RE | Admit: 2017-02-10 | Discharge: 2017-02-10 | Disposition: A | Discharge status: Home / Self Care | Payer: Medicare Other | Source: Ambulatory Visit | Attending: Medical | Admitting: Medical

## 2017-02-10 ENCOUNTER — Ambulatory Visit (INDEPENDENT_AMBULATORY_CARE_PROVIDER_SITE_OTHER): Payer: Medicare Other | Admitting: Medical

## 2017-02-10 ENCOUNTER — Encounter: Payer: Self-pay | Admitting: Medical

## 2017-02-10 VITALS — BP 100/60 | HR 88 | Temp 98.1°F | Resp 16 | Ht 67.0 in | Wt 118.0 lb

## 2017-02-10 DIAGNOSIS — Z8719 Personal history of other diseases of the digestive system: Secondary | ICD-10-CM

## 2017-02-10 DIAGNOSIS — M542 Cervicalgia: Secondary | ICD-10-CM

## 2017-02-10 DIAGNOSIS — M419 Scoliosis, unspecified: Secondary | ICD-10-CM | POA: Diagnosis not present

## 2017-02-10 DIAGNOSIS — K5900 Constipation, unspecified: Secondary | ICD-10-CM

## 2017-02-10 MED ORDER — TRAMADOL HCL 50 MG PO TABS: ORAL_TABLET | ORAL | 1 refills | Status: DC

## 2017-02-10 NOTE — Progress Notes (Signed)
Subjective:    Patient ID: [name], male    DOB: 19-May-1956, 61 y.o.   MRN: [id number]  HPI  Pt in for follow up.   Pt states his last bowel movement was today But he had small hard stool.  Pt had some mild loose stools for the last 4 days preceding most recent small hard stool.  He reports that he started to feel little bit distended last day or 2.  Patient has history of bowel obstruction and GI surgeries. Towards the end of August he had similar presentation. He thought that time he had loose stools/possible viral illness. However when his bowel pattern persisted I decided to get an x-ray based on his history and last abdomen x-ray showed.   The x-ray showed large amount of stool seen throughout the colon suggesting constipation. No abnormal bowel dilatation is noted.  At that time I advised magnesium citrate and he states he had a large bowel movement and felt a lot better. Then about 4 days ago he had the above described again.    Pt has history of neck pain.  Pt has a lot of arthritis(see x-ray report). Nsaids were not adequate. He takes 1 tablet twice a day.     Review of Systems  Constitutional: Negative for chills, fatigue and fever.  Respiratory: Negative for cough, chest tightness, shortness of breath and wheezing.   Cardiovascular: Negative for chest pain and palpitations.  Gastrointestinal: Positive for abdominal distention and constipation. Negative for anal bleeding, nausea, rectal pain and vomiting.       Mild distended but no pain .  Constipation with some loose stools.  Genitourinary: Negative for dysuria, flank pain, frequency, penile pain, penile swelling, testicular pain and urgency.  Musculoskeletal: Negative for back pain.  Neurological: Negative for dizziness, seizures, light-headedness, numbness and headaches.  Hematological: Negative for adenopathy. Does not bruise/bleed easily.  Psychiatric/Behavioral: Negative for behavioral problems and  confusion.     Past Medical History:  Diagnosis Date  . Anxiety   . Anxiety and depression 03/14/2010  . Colonic inertia    with chronic lifelong costipation  . Congenital fusion of cervical spine   . Depression   . FECAL INCONTINENCE 08/19/2007  . H/O: GI bleed   . Heart murmur    early childhood   . Hydrocephalus --per CT 04/2014 07/23/2014  . Idiopathic scoliosis   . 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 Pneumonia    childhood      Social History   Social History  . Marital status: Single    Spouse name: N/A  . Number of children: 0  . Years of education: N/A   Occupational History  . 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 Smoking status: Never Smoker  . Smokeless tobacco: Never Used  . Alcohol use No  . Drug use: No  . Sexual activity: Yes   Other Topics Concern  . Not on file   Social History Narrative   Lives w/ mother , has a younger brother and sister     Past Surgical History:  Procedure Laterality Date  . COLONOSCOPY  11/08/2006  . HARDWARE REMOVAL Right 10/23/2015   Procedure: RIGHT KNEE HARDWARE REMOVAL;  Surgeon: Ollen Gross, MD;  Location: [facility];  Service: Orthopedics;  Laterality: Right;  LMA  . HERNIA REPAIR  1980   left inguinal hernia  . LAPAROTOMY  07/18/2011   Procedure: EXPLORATORY LAPAROTOMY;  Surgeon: Sharlet Salina  Lurlean Leyden, MD;  Location: [facility];  Service: General;  Laterality: N/A;  lysis of adhesions entero enterostomy  . LAPAROTOMY  07/28/2011   Procedure: EXPLORATORY LAPAROTOMY;  Surgeon: Mariella Saa, MD;  Location: [facility];  Service: General;  Laterality: N/A;  . Left Knee surgery  1994, 1992  . ORIF PATELLA Right 12/14/2014   Procedure: OPEN REDUCTION INTERNAL (ORIF) FIXATION PATELLA;  Surgeon: Ollen Gross, MD;  Location: [facility];  Service: Orthopedics;  Laterality: Right;  . Surgery for imperforate anus  1958  . TONSILLECTOMY    . UPPER GASTROINTESTINAL ENDOSCOPY  05/15/03    Family  History  Problem Relation Age of Onset  . Alzheimer's disease Father   . Diabetes Mother   . Colon cancer Neg Hx   . Prostate cancer Neg Hx     No Known Allergies  Current Outpatient Prescriptions on File Prior to Visit  Medication Sig Dispense Refill  . diphenoxylate-atropine (LOMOTIL) 2.5-0.025 MG tablet 1-2 tab po qid as needed diarrhea 16 tablet 0  . traMADol (ULTRAM) 50 MG tablet take 1 tablet by mouth every 12 hours if needed for MODERATE TO SEVERE PAIN 60 tablet 1   No current facility-administered medications on file prior to visit.     BP 97/68   Pulse 88   Temp 98.1 F (36.7 C) (Oral)   Resp 16   Ht  (1.702 m)   Wt 118 lb (53.5 kg)   SpO2 98%   BMI 18.48 kg/m       Objective:   Physical Exam  General Appearance- Not in acute distress.  HEENT Eyes- Scleraeral/Conjuntiva-bilat- Not Yellow. Mouth & Throat- Normal.  Chest and Lung Exam Auscultation: Breath sounds:-Normal. Adventitious sounds:- No Adventitious sounds.  Cardiovascular Auscultation:Rythm - Regular. Heart Sounds -Normal heart sounds.  Abdomen Inspection:-Inspection Normal.  Palpation/Perucssion: Palpation and Percussion of the abdomen reveal- Non Tender, No Rebound tenderness, No rigidity(Guarding) and No Palpable abdominal masses.  Liver:-Normal.  Spleen:- Normal.        Assessment & Plan:  For your recent constipation with some mild loose stools over the past 4 days, I will get a abdomen x-ray to see if you have a large amount stool as you had with previous similar presentation. With your history of bowel obstruction and prior surgeries, will follow you closely.  If x-rays show significant amount of stool again as before will likely advise mag citrate over-the-counter again and confirm that you had good bowel movement.  If your pattern of recurrent constipation continues might consider getting GI opinion.  Regarding her neck pain, we have prescribed tramadol. During any future  constipation episodes it may be beneficial for you to stop the tramadol as opioid medications can be associated/cause constipation. However based on your neck x-ray imaging findings and your level pain I am hesitant to stop tramadol.   Follow-up in 7-10 days or as needed.  Carrah Eppolito, Ramon Dredge, PA-C

## 2017-02-10 NOTE — Patient Instructions (Signed)
For your recent constipation with some mild loose stools over the past 4 days, I will get a abdomen x-ray to see if you have a large amount stool as you had with previous similar presentation. With your history of bowel obstruction and prior surgeries, will follow you closely.  If x-rays show significant amount of stool again as before will likely advise mag citrate over-the-counter again and confirm that you had good bowel movement.  If your pattern of recurrent constipation continues might consider getting GI opinion.  Regarding her neck pain, we have prescribed tramadol. During any future constipation episodes it may be beneficial for you to stop the tramadol as opioid medications can be associated/cause constipation. However based on your neck x-ray imaging findings and your level pain I am hesitant to stop tramadol.   Follow-up in 7-10 days or as needed.

## 2017-02-11 ENCOUNTER — Telehealth: Payer: Self-pay | Admitting: Medical

## 2017-02-11 NOTE — Telephone Encounter (Signed)
Pt would like to make provider aware that the medication that he prescribed is working well and he will be sure to call if he need him.

## 2017-02-12 ENCOUNTER — Ambulatory Visit: Payer: Self-pay | Admitting: Medical

## 2017-02-16 ENCOUNTER — Encounter: Payer: Self-pay | Admitting: Family

## 2017-02-16 ENCOUNTER — Telehealth: Payer: Self-pay | Admitting: Medical

## 2017-02-16 ENCOUNTER — Ambulatory Visit (INDEPENDENT_AMBULATORY_CARE_PROVIDER_SITE_OTHER): Payer: Medicare Other | Admitting: Family Medicine

## 2017-02-16 ENCOUNTER — Encounter: Payer: Self-pay | Admitting: Family Medicine

## 2017-02-16 ENCOUNTER — Ambulatory Visit (INDEPENDENT_AMBULATORY_CARE_PROVIDER_SITE_OTHER): Payer: Medicare Other | Admitting: Family

## 2017-02-16 VITALS — BP 103/71 | HR 64 | Temp 97.7°F | Resp 16 | Ht 67.0 in | Wt 117.2 lb

## 2017-02-16 VITALS — BP 103/71 | HR 64 | Temp 97.7°F | Resp 16 | Ht 67.0 in | Wt 117.0 lb

## 2017-02-16 DIAGNOSIS — K921 Melena: Secondary | ICD-10-CM | POA: Diagnosis not present

## 2017-02-16 DIAGNOSIS — K5900 Constipation, unspecified: Secondary | ICD-10-CM

## 2017-02-16 DIAGNOSIS — Q428 Congenital absence, atresia and stenosis of other parts of large intestine: Secondary | ICD-10-CM

## 2017-02-16 DIAGNOSIS — Q423 Congenital absence, atresia and stenosis of anus without fistula: Secondary | ICD-10-CM | POA: Diagnosis not present

## 2017-02-16 DIAGNOSIS — Q421 Congenital absence, atresia and stenosis of rectum without fistula: Secondary | ICD-10-CM

## 2017-02-16 LAB — COMPREHENSIVE METABOLIC PANEL
ALBUMIN: 4.4 g/dL (ref 3.5–5.2)
ALT: 7 U/L (ref 0–53)
AST: 15 U/L (ref 0–37)
Alkaline Phosphatase: 51 U/L (ref 39–117)
BUN: 11 mg/dL (ref 6–23)
CHLORIDE: 103 meq/L (ref 96–112)
CO2: 30 mEq/L (ref 19–32)
Calcium: 9.4 mg/dL (ref 8.4–10.5)
Creatinine, Ser: 0.95 mg/dL (ref 0.40–1.50)
GFR: 85.91 mL/min (ref >60.00)
Glucose, Bld: 95 mg/dL (ref 70–99)
POTASSIUM: 3.6 meq/L (ref 3.5–5.1)
SODIUM: 139 meq/L (ref 135–145)
Total Bilirubin: 0.4 mg/dL (ref 0.2–1.2)
Total Protein: 6.7 g/dL (ref 6.0–8.3)

## 2017-02-16 LAB — CBC
HEMATOCRIT: 42.7 % (ref 39.0–52.0)
Hemoglobin: 14.1 g/dL (ref 13.0–17.0)
MCHC: 33 g/dL (ref 30.0–36.0)
MCV: 85.1 fl (ref 78.0–100.0)
Platelets: 244 10*3/uL (ref 150.0–400.0)
RBC: 5.02 Mil/uL (ref 4.22–5.81)
RDW: 14.5 % (ref 11.5–15.5)
WBC: 5 10*3/uL (ref 4.0–10.5)

## 2017-02-16 LAB — HEMOCCULT GUIAC POC 1CARD (OFFICE): Fecal Occult Blood, POC: NEGATIVE

## 2017-02-16 NOTE — Progress Notes (Signed)
   Subjective:    Patient ID: [name], male    DOB: 1955/11/29, 61 y.o.   MRN: [id number]  HPI   Review of Systems   Objective:   Physical Exam        Assessment & Plan:  Attempted to begin visit with pt and he declined. Would like male provider only.  Patient placed on a male provider schedule instead.

## 2017-02-16 NOTE — Assessment & Plan Note (Signed)
His most recent x-rays showing significant stool burden within the colon. This is likely related to some of the symptoms. Did not feel any stool burden with rectal exam today. - Advised increasing his water intake and fiber supplement until he loose stools achieved

## 2017-02-16 NOTE — Progress Notes (Signed)
[name] - 61 y.o. male MRN [id number]  Date of birth: Mar 01, 1956  SUBJECTIVE:  Including CC & ROS.  Chief Complaint  Patient presents with  . Blood In Stools    Pt reports 3 episodes of blood in stool over the last week. Reports stool are a little darker than usual. Notes some lightheadedness.     [name] is a 61 year old male that is presenting with reported bloody stools. He has been having these symptoms for about 2 weeks. He has painless loose stools. He denies any bright red blood. He notices some looseness of his stool and that's where he recognizes or feels that there is blood in it. He has had similar symptoms when he had an obstruction in 2013. He denies any history of inflammatory bowel disease. He also had surgery as an infant for imperforate anus. He reports his last bowel movement was this morning. There is no pain associated with it. He denies any recent fevers or chills.   He has a history of GI obstruction in 2013. His blood work performed on 01/13/17 shows normal hemoglobin and white count. Review of a abdominal x-ray from 02/10/17 shows extensive stool throughout the colon indicative of chronic constipation. There is no bowel obstruction or free air. Review of CT abdomen and pelvis from 05/13/15 shows a small bowel obstruction with diffuse stool throughout the colon consistent with constipation.  Review of Systems  Constitutional: Negative for fever.  Gastrointestinal: Positive for blood in stool. Negative for abdominal distention, abdominal pain and anal bleeding.  Musculoskeletal: Negative for gait problem.    HISTORY: Past Medical, Surgical, Social, and Family History Reviewed & Updated per EMR.   Pertinent Historical Findings include:  Past Medical History:  Diagnosis Date  . Anxiety   . Anxiety and depression 03/14/2010  . Colonic inertia    with chronic lifelong costipation  . Congenital fusion of cervical spine   . Depression   . FECAL INCONTINENCE  08/19/2007  . H/O: GI bleed   . Heart murmur    early childhood   . Hydrocephalus --per CT 04/2014 07/23/2014  . Idiopathic scoliosis   . 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 Pneumonia    childhood     Past Surgical History:  Procedure Laterality Date  . COLONOSCOPY  11/08/2006  . HARDWARE REMOVAL Right 10/23/2015   Procedure: RIGHT KNEE HARDWARE REMOVAL;  Surgeon: Ollen Gross, MD;  Location: [facility];  Service: Orthopedics;  Laterality: Right;  LMA  . HERNIA REPAIR  1980   left inguinal hernia  . LAPAROTOMY  07/18/2011   Procedure: EXPLORATORY LAPAROTOMY;  Surgeon: Mariella Saa, MD;  Location: [facility];  Service: General;  Laterality: N/A;  lysis of adhesions entero enterostomy  . LAPAROTOMY  07/28/2011   Procedure: EXPLORATORY LAPAROTOMY;  Surgeon: Mariella Saa, MD;  Location: [facility];  Service: General;  Laterality: N/A;  . Left Knee surgery  1994, 1992  . ORIF PATELLA Right 12/14/2014   Procedure: OPEN REDUCTION INTERNAL (ORIF) FIXATION PATELLA;  Surgeon: Ollen Gross, MD;  Location: [facility];  Service: Orthopedics;  Laterality: Right;  . Surgery for imperforate anus  1958  . TONSILLECTOMY    . UPPER GASTROINTESTINAL ENDOSCOPY  05/15/03    No Known Allergies  Family History  Problem Relation Age of Onset  . Alzheimer's disease Father   . Diabetes Mother   . Colon cancer Neg Hx   . Prostate cancer Neg Hx      Social History  Social History  . Marital status: Single    Spouse name: N/A  . Number of children: 0  . Years of education: N/A   Occupational History  . 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 Smoking status: Never Smoker  . Smokeless tobacco: Never Used  . Alcohol use No  . Drug use: No  . Sexual activity: Yes   Other Topics Concern  . Not on file   Social History Narrative   Lives w/ mother , has a younger brother and sister      PHYSICAL EXAM:  VS: BP 103/71 (BP Location: Right Arm, Patient Position:  Sitting, Cuff Size: Normal)   Pulse 64   Temp 97.7 F (36.5 C) (Oral)   Resp 16   Ht  (1.702 m)   Wt 117 lb (53.1 kg)   SpO2 100%   BMI 18.32 kg/m  Physical Exam Gen: NAD, alert, cooperative with exam,  ENT: normal lips, normal nasal mucosa,  Eye: normal EOM, normal conjunctiva and lids CV:  no edema, +2 pedal pulses, regular rate and rhythm, S1-S2   Resp: no accessory muscle use, non-labored, clear to auscultation bilaterally, GI: no masses or tenderness, no hernia, soft, no distention  Rectum: Abnormal tone as reconstruction of the anus, some outpouching of the inner rectum but reported as normal from the patient, no bright red blood, no pain with exam Skin: no rashes, no areas of induration  Neuro: normal tone, normal sensation to touch Psych:  normal insight, alert and oriented MSK: Normal gait, normal strength      ASSESSMENT & PLAN:   Constipation His most recent x-rays showing significant stool burden within the colon. This is likely related to some of the symptoms. Did not feel any stool burden with rectal exam today. - Advised increasing his water intake and fiber supplement until he loose stools achieved  Bloody stools Fecal occult stool card was negative today. He reports he had similar symptoms when he had an obstruction a few years ago. Reports having a bowel movement this morning and no signs on exam today to suggest obstruction - CMP, CBC - Referral to GI

## 2017-02-16 NOTE — Assessment & Plan Note (Signed)
Fecal occult stool card was negative today. He reports he had similar symptoms when he had an obstruction a few years ago. Reports having a bowel movement this morning and no signs on exam today to suggest obstruction - CMP, CBC - Referral to GI

## 2017-02-16 NOTE — Telephone Encounter (Signed)
Pt called in to schedule an apt. Pt says that he have blood in his stool. Transferred pt to team health for triaging.

## 2017-02-16 NOTE — Addendum Note (Signed)
Addended by: Harley Alto on: 02/16/2017 02:46 PM   Modules accepted: Orders

## 2017-02-16 NOTE — Patient Instructions (Signed)
Thank you for coming in,   Please try to drink plenty of water and use a fiber supplement.   We will call you with the results from today.    Please feel free to call with any questions or concerns at any time, at [phone]. --Dr. Jordan Likes

## 2017-02-16 NOTE — Telephone Encounter (Signed)
Patient Name: [name]  DOB: [birth year]    Initial Comment Caller states he has blood in his stool.   Nurse Assessment  Nurse: Earlene Plater RN, Lupita Leash Date/Time Lamount Cohen Time): 02/16/2017 9:51:47 AM  Confirm and document reason for call. If symptomatic, describe symptoms. ---Caller states he has blood in his stool. Started a few days ago. Denies constipation. Having diarrhea. 9 times a day. Urinated in last 12 hours. Has a history. No fever.  Does the patient have any new or worsening symptoms? ---Yes  Will a triage be completed? ---Yes  Related visit to physician within the last 2 weeks? ---Yes  Does the PT have any chronic conditions? (i.e. diabetes, asthma, etc.) ---No  Is this a behavioral health or substance abuse call? ---No     Guidelines    Guideline Title Affirmed Question Affirmed Notes  Rectal Bleeding Tarry or jet black-colored stool (not dark green)    Final Disposition User   Go to ED Now Earlene Plater, RN, Lupita Leash    Comments  Caller has an appointment. at 12:45.   Referrals  REFERRED TO PCP OFFICE   Caller Disagree/Comply Disagree  Caller Understands Yes  PreDisposition Call Doctor

## 2017-02-18 ENCOUNTER — Telehealth: Payer: Self-pay | Admitting: Medical

## 2017-02-18 ENCOUNTER — Encounter: Payer: Self-pay | Admitting: Medical

## 2017-02-18 DIAGNOSIS — R198 Other specified symptoms and signs involving the digestive system and abdomen: Secondary | ICD-10-CM

## 2017-02-18 DIAGNOSIS — R195 Other fecal abnormalities: Secondary | ICD-10-CM

## 2017-02-18 DIAGNOSIS — Z8719 Personal history of other diseases of the digestive system: Secondary | ICD-10-CM

## 2017-02-18 NOTE — Telephone Encounter (Signed)
Please advise pt to go to radiology dept for xray. I know he was seen recently in our office but with his history of some chronic constipation and bowel obstruction I need to know what x-ray shows before advising on treatment. He wants me to give medication for diarrhea/loose stools.  But in recent past he actually had a lot of stool present and needed to clear out the stool/was actually constipated. So please notify him that I replaced order and if he would do the study this afternoon.

## 2017-02-18 NOTE — Telephone Encounter (Signed)
Patient needs to see GI M.D. Looks like he was referred but [facility] states he was dismissed. See referral note. So would you please refer him to his former GI. He may have to ask the patient who he saw before. Explained the situation that I do think it is best for him to see the former GI who is familiar with his medical history.

## 2017-02-18 NOTE — Telephone Encounter (Signed)
Open chart to review

## 2017-02-18 NOTE — Telephone Encounter (Signed)
Pt aware/he just got off work and will have X-Ray done shortly/thx dmf

## 2017-02-18 NOTE — Telephone Encounter (Signed)
°  Relation to ZO:XWRU Call back number:[phone] Pharmacy: [facility] [address], [place] - [address] Sherian Maroon [phone] (Phone) [phone] (Fax)     Reason for call:  Patient requesting anti diarrhea extra strength Rx, patient states PCP is aware of he's on going gastro concerns, please advise

## 2017-02-18 NOTE — Telephone Encounter (Signed)
Referral faxed to [facility], awaiting appt °

## 2017-02-19 ENCOUNTER — Ambulatory Visit (HOSPITAL_BASED_OUTPATIENT_CLINIC_OR_DEPARTMENT_OTHER)
Admission: RE | Admit: 2017-02-19 | Discharge: 2017-02-19 | Disposition: A | Discharge status: Home / Self Care | Payer: Medicare Other | Source: Ambulatory Visit | Attending: Medical | Admitting: Medical

## 2017-02-19 DIAGNOSIS — Z8719 Personal history of other diseases of the digestive system: Secondary | ICD-10-CM | POA: Diagnosis not present

## 2017-02-19 DIAGNOSIS — R195 Other fecal abnormalities: Secondary | ICD-10-CM | POA: Diagnosis not present

## 2017-02-19 DIAGNOSIS — R197 Diarrhea, unspecified: Secondary | ICD-10-CM | POA: Diagnosis not present

## 2017-02-24 ENCOUNTER — Telehealth: Payer: Self-pay | Admitting: Medical

## 2017-02-24 NOTE — Telephone Encounter (Signed)
Pt called in to make provider aware that medication is working fairly well. Pt says that he was exhausted yesterday so he missed work.    Pt would like a excused work note for missing work yesterday. Pt would like to be called when ready for pick up.    CB: [id number]

## 2017-02-24 NOTE — Telephone Encounter (Signed)
You can write him work note and I will sign.

## 2017-02-24 NOTE — Telephone Encounter (Signed)
ES-Plz see pt req for work note for yesterday/plz advise/thx dmf

## 2017-02-25 NOTE — Telephone Encounter (Signed)
Created note and gave to JT/she will call pt to P/U/thx dmf

## 2017-03-10 ENCOUNTER — Ambulatory Visit (INDEPENDENT_AMBULATORY_CARE_PROVIDER_SITE_OTHER): Payer: Medicare Other | Admitting: Medical

## 2017-03-10 VITALS — BP 106/60 | HR 67 | Temp 97.7°F | Resp 16 | Ht 67.0 in | Wt 118.4 lb

## 2017-03-10 DIAGNOSIS — R198 Other specified symptoms and signs involving the digestive system and abdomen: Secondary | ICD-10-CM

## 2017-03-10 NOTE — Progress Notes (Signed)
Subjective:    Patient ID: [name], male    DOB: April 07, 1956, 61 y.o.   MRN: [id number]  HPI   Pt in states he has appointment with [facility] on Oct 29,2018.   Pt states overall he is doing better. He states states eating nutrition bars and white bread. He states presently this is best regimen he found Staying hydrated seems to help his bowel movements as well. He is having bowel movements about twice a day since I last saw him.   Pt request some days off until he can see GI.   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 Some times when stomach condition flares he will have up to 9 loose stools a day and will have to excuse himself and notify his supervisor.He indicates this is embarrassing.  Pt declined flu vaccine.   Review of Systems  Constitutional: Negative for chills, fatigue and fever.  Respiratory: Negative for cough, chest tightness, shortness of breath and wheezing.   Cardiovascular: Negative for chest pain and palpitations.  Gastrointestinal: Negative for abdominal distention, abdominal pain, rectal pain and vomiting.       Hx of constipation with intermittnet bouts of loose stools associated. By history and confirmed by xray. Large retained stools often.  Musculoskeletal: Negative for back pain, myalgias and neck pain.  Skin: Negative for rash.  Neurological: Negative for dizziness, speech difficulty, weakness and light-headedness.  Hematological: Negative for adenopathy. Does not bruise/bleed easily.  Psychiatric/Behavioral: Negative for confusion.    Past Medical History:  Diagnosis Date  . Anxiety   . Anxiety and depression 03/14/2010  . Colonic inertia    with chronic lifelong costipation  . Congenital fusion of cervical spine   . Depression   . FECAL INCONTINENCE 08/19/2007  . H/O: GI bleed   . Heart murmur    early childhood   . Hydrocephalus --per CT 04/2014 07/23/2014  . Idiopathic scoliosis   . 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 Pneumonia    childhood      Social History   Social History  . Marital status: Single    Spouse name: N/A  . Number of children: 0  . Years of education: N/A   Occupational History  . 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 Smoking status: Never Smoker  . Smokeless tobacco: Never Used  . Alcohol use No  . Drug use: No  . Sexual activity: Yes   Other Topics Concern  . Not on file   Social History Narrative   Lives w/ mother , has a younger brother and sister     Past Surgical History:  Procedure Laterality Date  . COLONOSCOPY  11/08/2006  . HARDWARE REMOVAL Right 10/23/2015   Procedure: RIGHT KNEE HARDWARE REMOVAL;  Surgeon: Ollen Gross, MD;  Location: [facility];  Service: Orthopedics;  Laterality: Right;  LMA  . HERNIA REPAIR  1980   left inguinal hernia  . LAPAROTOMY  07/18/2011   Procedure: EXPLORATORY LAPAROTOMY;  Surgeon: Mariella Saa, MD;  Location: [facility];  Service: General;  Laterality: N/A;  lysis of adhesions entero enterostomy  . LAPAROTOMY  07/28/2011   Procedure: EXPLORATORY LAPAROTOMY;  Surgeon: Mariella Saa, MD;  Location: [facility];  Service: General;  Laterality: N/A;  . Left Knee surgery  1994, 1992  . ORIF PATELLA Right 12/14/2014   Procedure: OPEN REDUCTION INTERNAL (ORIF) FIXATION PATELLA;  Surgeon: Ollen Gross, MD;  Location:  [facility];  Service: Orthopedics;  Laterality: Right;  . Surgery for imperforate anus  1958  . TONSILLECTOMY    . UPPER GASTROINTESTINAL ENDOSCOPY  05/15/03    Family History  Problem Relation Age of Onset  . Alzheimer's disease Father   . Diabetes Mother   . Colon cancer Neg Hx   . Prostate cancer Neg Hx     No Known Allergies  Current Outpatient Prescriptions on File Prior to Visit  Medication Sig Dispense Refill  . traMADol (ULTRAM) 50 MG tablet take 1 tablet by mouth every 12 hours if needed for MODERATE TO SEVERE PAIN 60 tablet 1   No current facility-administered medications on  file prior to visit.     BP 106/60   Pulse 67   Temp 97.7 F (36.5 C) (Oral)   Resp 16   Ht 5\' 7"  (1.702 m)   Wt 118 lb 6.4 oz (53.7 kg)   SpO2 100%   BMI 18.54 kg/m       Objective:   Physical Exam  General Appearance- Not in acute distress.  HEENT Eyes- Scleraeral/Conjuntiva-bilat- Not Yellow. Mouth & Throat- Normal.  Chest and Lung Exam Auscultation: Breath sounds:-Normal. Adventitious sounds:- No Adventitious sounds.  Cardiovascular Auscultation:Rythm - Regular. Heart Sounds -Normal heart sounds.  Abdomen Inspection:-Inspection Normal.  Palpation/Perucssion: Palpation and Percussion of the abdomen reveal- Non Tender, No Rebound tenderness, No rigidity(Guarding) and No Palpable abdominal masses.  Liver:-Normal.  Spleen:- Normal.   Back- no cva entertainment.      Assessment & Plan:  For your history of abnormal bowel movements, I am going to write you work note until GI evaluates you. Then will defer treatment and further time off to GI.  You're presently arestable but if you get recurrent repetitive loose stools with no significant bowel movement please let me know. I could repeat the x-ray of abdomen and see if you have significant retained stool burden. Continue current diet regimen as you report that has been helpful.  Follow-up date to be determined after GI evaluation.  Bridie Colquhoun, Ramon DredgeEdward, PA-C  Also explained to pt about FMLA forms that we could fill out for chronic conditions that require intermittent work abcenses.

## 2017-03-10 NOTE — Patient Instructions (Addendum)
For your history of abnormal bowel movements, I am going to write you work note until GI evaluates you. Then will defer treatment and further time off to GI.  You're presently arestable but if you get recurrent repetitive loose stools with no significant bowel movement please let me know. I could repeat the x-ray of abdomen and see if you have significant retained stool burden. Continue current diet regimen as you report that has been helpful.  Follow-up date to be determined after GI evaluation.

## 2017-03-18 DIAGNOSIS — R195 Other fecal abnormalities: Secondary | ICD-10-CM | POA: Diagnosis not present

## 2017-03-18 DIAGNOSIS — Z8719 Personal history of other diseases of the digestive system: Secondary | ICD-10-CM | POA: Diagnosis not present

## 2017-03-18 DIAGNOSIS — K599 Functional intestinal disorder, unspecified: Secondary | ICD-10-CM | POA: Diagnosis not present

## 2017-03-18 DIAGNOSIS — K5909 Other constipation: Secondary | ICD-10-CM | POA: Diagnosis not present

## 2017-03-18 DIAGNOSIS — R197 Diarrhea, unspecified: Secondary | ICD-10-CM | POA: Diagnosis not present

## 2017-03-18 DIAGNOSIS — Z87738 Personal history of other specified (corrected) congenital malformations of digestive system: Secondary | ICD-10-CM | POA: Diagnosis not present

## 2017-03-24 ENCOUNTER — Telehealth: Payer: Self-pay | Admitting: Medical

## 2017-03-24 NOTE — Telephone Encounter (Signed)
Pt request return to work letter stating ok to return to work tomorrow 03/25/17. Pt plans to pick it up today.

## 2017-03-24 NOTE — Telephone Encounter (Signed)
Note signed return to work. Did not see. He came in and requested.

## 2017-04-27 DIAGNOSIS — R197 Diarrhea, unspecified: Secondary | ICD-10-CM | POA: Diagnosis not present

## 2017-04-27 DIAGNOSIS — Z8719 Personal history of other diseases of the digestive system: Secondary | ICD-10-CM | POA: Diagnosis not present

## 2017-04-27 DIAGNOSIS — K599 Functional intestinal disorder, unspecified: Secondary | ICD-10-CM | POA: Diagnosis not present

## 2017-04-27 DIAGNOSIS — Z87738 Personal history of other specified (corrected) congenital malformations of digestive system: Secondary | ICD-10-CM | POA: Diagnosis not present

## 2017-04-27 DIAGNOSIS — K5909 Other constipation: Secondary | ICD-10-CM | POA: Diagnosis not present

## 2017-05-31 ENCOUNTER — Encounter: Payer: Self-pay | Admitting: Physician Assistant

## 2017-05-31 ENCOUNTER — Other Ambulatory Visit: Payer: Self-pay

## 2017-05-31 ENCOUNTER — Ambulatory Visit: Payer: Self-pay | Admitting: Physician Assistant

## 2017-05-31 VITALS — BP 98/68 | HR 83 | Temp 98.0°F | Resp 18 | Ht 66.54 in | Wt 118.2 lb

## 2017-05-31 DIAGNOSIS — R591 Generalized enlarged lymph nodes: Secondary | ICD-10-CM

## 2017-05-31 DIAGNOSIS — K089 Disorder of teeth and supporting structures, unspecified: Secondary | ICD-10-CM

## 2017-05-31 DIAGNOSIS — K047 Periapical abscess without sinus: Secondary | ICD-10-CM

## 2017-05-31 MED ORDER — AMOXICILLIN-POT CLAVULANATE 875-125 MG PO TABS: 1.0000 | ORAL_TABLET | Freq: Two times a day (BID) | ORAL | 0 refills | Status: DC

## 2017-05-31 NOTE — Patient Instructions (Addendum)
   Due to the appearance of your left lower dentition along with your inflamed lymph nodes, I am worried about underlying abscess.  We are going to treat you with an antibiotic to help with any underlying infection but the mainstay of treatment is to see a dentist and have this tooth examined as it may need to be removed.  In the meantime, I have obtained a CBC to look at your white blood cell and red blood cell count.  We expect the lymph node to decrease in size and tenderness with the use of antibiotic.  He may also use over-the-counter anti-inflammatory such as ibuprofen to help with swelling.  Please follow-up in office in 1 week for reevaluation.  Return sooner if any of your symptoms worsen or he develop new concerning symptoms. Dental Abscess A dental abscess is pus in or around a tooth. Follow these instructions at home:  Take medicines only as told by your dentist.  If you were prescribed antibiotic medicine, finish all of it even if you start to feel better.  Rinse your mouth (gargle) often with salt water.  Do not drive or use heavy machinery, like a lawn mower, while taking pain medicine.  Do not apply heat to the outside of your mouth.  Keep all follow-up visits as told by your dentist. This is important. Contact a doctor if:  Your pain is worse, and medicine does not help. Get help right away if:  You have a fever or chills.  Your symptoms suddenly get worse.  You have a very bad headache.  You have problems breathing or swallowing.  You have trouble opening your mouth.  You have puffiness (swelling) in your neck or around your eye. This information is not intended to replace advice given to you by your health care provider. Make sure you discuss any questions you have with your health care provider. Document Released: 09/25/2014 Document Revised: 10/17/2015 Document Reviewed: 05/08/2014 Elsevier Interactive Patient Education  2018 [employer]   IF you received  an x-ray today, you will receive an invoice from [facility]. Please contact [facility] at [phone] with questions or concerns regarding your invoice.   IF you received labwork today, you will receive an invoice from [facility]. Please contact [facility] at [phone] with questions or concerns regarding your invoice.   Our billing staff will not be able to assist you with questions regarding bills from these companies.  You will be contacted with the lab results as soon as they are available. The fastest way to get your results is to activate your My Chart account. Instructions are located on the last page of this paperwork. If you have not heard from us regarding the results in 2 weeks, please contact this office.

## 2017-05-31 NOTE — Progress Notes (Signed)
Patrick BeringRandall N Knezevic  MRN: [id number] DOB: 02/02/1956  Subjective:  [name] is a 62 y.o. male seen in office today for a chief complaint of swollen lymph nodes in left-sided neck x 5 days.  Has associated pain.  Notes the lymph nodes have increased in size.  Has had some mild tooth discomfort.  Denies sore throat, ear pain, difficulty swallowing, cough, congestion, shortness of breath, fever, unexplained weight loss, chills, night sweats, and diaphoresis.  Patient has not seen a dentist in quite some time.  Denies smoking.  No new medication use.  Takes tramadol daily for arthritis.  No past medical history of cancer.  No family history of lymphoma.  Has no other questions or concerns today.  Review of Systems  Constitutional: Positive for fatigue.  HENT: Negative for voice change.   Respiratory: Negative for chest tightness and wheezing.   Cardiovascular: Negative for chest pain, palpitations and leg swelling.  Gastrointestinal: Negative for abdominal pain, nausea and vomiting.  Neurological: Negative for dizziness and light-headedness.    Patient Active Problem List   Diagnosis Date Noted  . Bloody stools 02/16/2017  . Painful orthopaedic hardware (HCC) 10/22/2015  . Small bowel obstruction due to adhesions (HCC) 05/13/2015  . 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  . Patella fracture 12/14/2014  . Constipation 11/21/2014  . Annual physical exam 07/23/2014  . Hydrocephalus --per CT 04/2014 07/23/2014  . Dizziness and giddiness 05/09/2014  . Obstipation 07/16/2011  . FATIGUE 07/02/2010  . DYSPNEA 07/02/2010  . Anxiety and depression 03/14/2010  . GI BLEEDING 03/14/2010  . SCOLIOSIS 03/14/2010  . ARTHRITIS 08/19/2007  . 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 FECAL INCONTINENCE 08/19/2007  . INGUINAL HERNIA, HX OF 08/19/2007    Current Outpatient Medications on File Prior to Visit  Medication Sig Dispense Refill  . traMADol (ULTRAM) 50 MG tablet take 1 tablet by mouth every 12  hours if needed for MODERATE TO SEVERE PAIN 60 tablet 1   No current facility-administered medications on file prior to visit.     No Known Allergies   Objective:  BP 98/68 (BP Location: Left Arm, Patient Position: Sitting, Cuff Size: Normal)   Pulse 83   Temp 98 F (36.7 C) (Oral)   Resp 18   Ht 5' 6.54" (1.69 m)   Wt 118 lb 3.2 oz (53.6 kg)   SpO2 97%   BMI 18.77 kg/m   Physical Exam  Constitutional: He is oriented to person, place, and time and well-developed, well-nourished, and in no distress. He appears cachectic. No distress.  HENT:  Head: Normocephalic and atraumatic.  Right Ear: Tympanic membrane, external ear and ear canal normal.  Left Ear: Tympanic membrane, external ear and ear canal normal.  Nose: No mucosal edema or rhinorrhea. Right sinus exhibits no maxillary sinus tenderness and no frontal sinus tenderness. Left sinus exhibits no maxillary sinus tenderness and no frontal sinus tenderness.  Mouth/Throat: Oropharynx is clear and moist and mucous membranes are normal. Abnormal dentition ( left lower gum with fractured tooth with blackened discoloration, small amount of purulent drainage surrounding tooth remnants at left lower gum, mild tenderness with palpation of surrounding gum, no fluctuance or erythema noted ). Dental caries (multiple dental caries noted) present.  Eyes: Conjunctivae are normal.  Neck: Normal range of motion.  Pulmonary/Chest: Effort normal and breath sounds normal. He has no wheezes. He has no rhonchi. He has no rales.  Lymphadenopathy:       Head (right side): No submental, no submandibular, no tonsillar,  no preauricular, no posterior auricular and no occipital adenopathy present.       Head (left side): No submental, no submandibular, no tonsillar, no preauricular, no posterior auricular and no occipital adenopathy present.    He has cervical adenopathy (one enlarged anterior cervical lymph node measuring ~2 cm, freely mobile, mild tenderness  with palpation, no overlying warmth or erythema).       Right cervical: No superficial cervical, no deep cervical and no posterior cervical adenopathy present.      Left cervical: Posterior cervical ( one small, freely mobile, tender posterior cervical lymph node palpated, no overlying warmth or erythema noted) adenopathy present. No superficial cervical and no deep cervical adenopathy present.    He has no axillary adenopathy.       Right: No inguinal, no supraclavicular and no epitrochlear adenopathy present.       Left: No inguinal, no supraclavicular and no epitrochlear adenopathy present.  Neurological: He is alert and oriented to person, place, and time. Gait normal.  Skin: Skin is warm and dry.  Psychiatric: Affect normal.  Vitals reviewed.   Assessment and Plan :  1. Lymphadenopathy - CBC with Differential/Platelet 2. Poor dentition 3. Dental infection Physical exam findings suspicious for underlying dental abscess.  Patient has poor dentition throughout oral cavity.  On left lower gum there is a fractured tooth with blackened discoloration and minimal purulent drainage surrounding remnants.  No further purulent drainage can be expressed.  No fluctuance noted on gingiva.  Left-sided cervical lymphadenopathy likely due to underlying dental infection.  Will treat with oral antibiotic at this time.  Patient strongly encouraged to contact a dentist and schedule an appointment for further evaluation and treatment as soon as possible. Plan to follow up here in 1 week or sooner if symptoms worsen or he develops new concerning symptoms. - amoxicillin-clavulanate (AUGMENTIN) 875-125 MG tablet; Take 1 tablet by mouth 2 (two) times daily.  Dispense: 20 tablet; Refill: 0  Benjiman Core PA-C  [facility] 05/31/2017 12:57 PM

## 2017-06-01 LAB — CBC WITH DIFFERENTIAL/PLATELET
BASOS: 1 %
Basophils Absolute: 0.1 10*3/uL (ref 0.0–0.2)
EOS (ABSOLUTE): 0.1 10*3/uL (ref 0.0–0.4)
Eos: 2 %
HEMATOCRIT: 42.3 % (ref 37.5–51.0)
HEMOGLOBIN: 13.8 g/dL (ref 13.0–17.7)
IMMATURE GRANS (ABS): 0 10*3/uL (ref 0.0–0.1)
Immature Granulocytes: 0 %
LYMPHS: 24 %
Lymphocytes Absolute: 1.3 10*3/uL (ref 0.7–3.1)
MCH: 27 pg (ref 26.6–33.0)
MCHC: 32.6 g/dL (ref 31.5–35.7)
MCV: 83 fL (ref 79–97)
MONOCYTES: 8 %
Monocytes Absolute: 0.4 10*3/uL (ref 0.1–0.9)
NEUTROS ABS: 3.3 10*3/uL (ref 1.4–7.0)
Neutrophils: 65 %
Platelets: 210 10*3/uL (ref 150–379)
RBC: 5.12 x10E6/uL (ref 4.14–5.80)
RDW: 14.4 % (ref 12.3–15.4)
WBC: 5.2 10*3/uL (ref 3.4–10.8)

## 2017-06-03 ENCOUNTER — Encounter: Payer: Self-pay | Admitting: *Deleted

## 2017-06-04 NOTE — Progress Notes (Signed)
Spoke to pt regarding his lab results. All is well.

## 2017-06-07 ENCOUNTER — Other Ambulatory Visit: Payer: Self-pay | Admitting: Medical

## 2017-06-07 DIAGNOSIS — Z79899 Other long term (current) drug therapy: Secondary | ICD-10-CM

## 2017-06-07 DIAGNOSIS — M542 Cervicalgia: Secondary | ICD-10-CM

## 2017-06-08 NOTE — Telephone Encounter (Signed)
Refill Request: Tramadol   Last RX:02/10/17 Last OV:03/10/17 Next OV: None scheduled  UDS:06/15/16 CSC:06/15/16

## 2017-06-08 NOTE — Telephone Encounter (Signed)
He needs uds and we need him to get new controlled med contract. Then he can pick up rx of tramadol. Ask pt if he has contacted dentist for recent probable tooth abscess. Saw recent note where [facility].

## 2017-06-09 NOTE — Telephone Encounter (Signed)
Notified pt he has to come in to give UDS and contract. Pt stated he will call to be place on lab schedule.

## 2017-06-11 ENCOUNTER — Other Ambulatory Visit: Payer: Medicare Other

## 2017-06-11 DIAGNOSIS — Z79899 Other long term (current) drug therapy: Secondary | ICD-10-CM | POA: Diagnosis not present

## 2017-06-12 LAB — PAIN MGMT, PROFILE 8 W/CONF, U
6 Acetylmorphine: NEGATIVE ng/mL (ref <10)
ALCOHOL METABOLITES: NEGATIVE ng/mL (ref <500)
Amphetamines: NEGATIVE ng/mL (ref <500)
Benzodiazepines: NEGATIVE ng/mL (ref <100)
Buprenorphine, Urine: NEGATIVE ng/mL (ref <5)
Cocaine Metabolite: NEGATIVE ng/mL (ref <150)
Creatinine: 34.3 mg/dL
MARIJUANA METABOLITE: NEGATIVE ng/mL (ref <20)
MDMA: NEGATIVE ng/mL (ref <500)
OXIDANT: NEGATIVE ug/mL (ref <200)
OXYCODONE: NEGATIVE ng/mL (ref <100)
Opiates: NEGATIVE ng/mL (ref <100)
PH: 7.7 (ref 4.5–9.0)

## 2017-07-06 ENCOUNTER — Other Ambulatory Visit: Payer: Self-pay | Admitting: Medical

## 2017-07-06 DIAGNOSIS — M542 Cervicalgia: Secondary | ICD-10-CM

## 2017-07-07 ENCOUNTER — Telehealth: Payer: Self-pay | Admitting: Medical

## 2017-07-07 NOTE — Telephone Encounter (Signed)
Refill request: Tramadol   Last RX:06/08/2017 Last OV:05/31/2017 Next VW:[id number]:None scheduled  UDS:06/11/2017 CSC:06/11/2016

## 2017-07-07 NOTE — Telephone Encounter (Signed)
Did you write the prescription for tramadol.  It sedates authorized by you.  But do I need to send it now to the pharmacy.  It appears that it has been 30 days since his last prescription refill.  Let me know.  Thanks

## 2017-07-07 NOTE — Telephone Encounter (Signed)
Could you call and schedule appointment.  

## 2017-07-07 NOTE — Telephone Encounter (Signed)
But also on review it has been October since I have seen him.  So he is due for a controlled medication visit.  He should be scheduled for the end of this month.

## 2017-07-07 NOTE — Telephone Encounter (Signed)
Signed the rx. But have him schedule within next month with me.He will be overdue for visit after this month.

## 2017-07-07 NOTE — Telephone Encounter (Signed)
Rx is printed and waiting for signature

## 2017-07-13 NOTE — Telephone Encounter (Signed)
Pt has been scheduled.  °

## 2017-07-14 ENCOUNTER — Ambulatory Visit (INDEPENDENT_AMBULATORY_CARE_PROVIDER_SITE_OTHER): Payer: Medicare Other | Admitting: Medical

## 2017-07-14 ENCOUNTER — Encounter: Payer: Self-pay | Admitting: Medical

## 2017-07-14 VITALS — BP 113/67 | HR 99 | Temp 97.7°F | Resp 16 | Ht 66.0 in | Wt 126.8 lb

## 2017-07-14 DIAGNOSIS — M25512 Pain in left shoulder: Secondary | ICD-10-CM

## 2017-07-14 DIAGNOSIS — M542 Cervicalgia: Secondary | ICD-10-CM

## 2017-07-14 DIAGNOSIS — M25511 Pain in right shoulder: Secondary | ICD-10-CM | POA: Diagnosis not present

## 2017-07-14 DIAGNOSIS — G8929 Other chronic pain: Secondary | ICD-10-CM

## 2017-07-14 NOTE — Progress Notes (Signed)
   Subjective:    Patient ID: [name], male    DOB: 09/23/1955, 62 y.o.   MRN: [id number]  HPI  Pt in for follow up. He has severe neck pain. Has diffuse severe djd of neck seen on xray. Pain controlled with tramadol adequatley.  Patient has done well with tramadol for this pain.    In the past he had taken 2 tabs daily.  He does have history of some chronic constipation and he is aware that decreasing dosage at times will be beneficial and will help his bowel movements.  Also he has shoulder pain bilaterally now as well. Off and on shoulder pain for years. But since last time I saw him pain worse. Left shoulder is worse.  Declines x-rays of the shoulders today.  He states that he has been doing with the pain for years and would let me know if you decide worsen.      Review of Systems  Constitutional: Negative for chills, fatigue and fever.  Respiratory: Negative for cough, chest tightness and wheezing.   Cardiovascular: Negative for chest pain and palpitations.  Musculoskeletal:       Neck pain and shoulder pain.  Neurological: Negative for dizziness, seizures, speech difficulty, weakness, light-headedness, numbness and headaches.  Hematological: Negative for adenopathy. Does not bruise/bleed easily.  Psychiatric/Behavioral: Negative for behavioral problems and confusion.       Objective:   Physical Exam  General- No acute distress. Pleasant patient. Neck- Full range of motion, no jvd. Lungs- Clear, even and unlabored. Heart- regular rate and rhythm. Neurologic- CNII- XII grossly intact.  Bilateral shoulders-patient actually has good range of motion of both shoulders.  No direct pain on palpation on either side.      Assessment & Plan:  For your both your chronic neck pain and shoulder pain, I want you to continue the tramadol.  You are up-to-date on both your contract and your urine drug screen.  You can use the tramadol twice daily but you could taper down if you  start to notice constipation.  When you run out of your current tramadol prescription please call us and let me know.  I will try to send in the prescription refill electronically.  I do need you to follow-up every 3 months following our controlled medication policy.  Follow-up in 3 months or as needed.  Narx reviewed today as well. I saw only rx by myself.  Esperanza RichtersEdward Carolene Gitto, PA-C

## 2017-07-14 NOTE — Patient Instructions (Signed)
For your both your chronic neck pain and shoulder pain, I want you to continue the tramadol.  You are up-to-date on both your contract and your urine drug screen.  You can use the tramadol twice daily but you could taper down if you start to notice constipation.  When you run out of your current tramadol prescription please call us and let me know.  I will try to send in the prescription refill electronically.  I do need you to follow-up every 3 months following our controlled medication policy.  Follow-up in 3 months or as needed.

## 2017-07-26 DIAGNOSIS — K5909 Other constipation: Secondary | ICD-10-CM | POA: Diagnosis not present

## 2017-07-26 DIAGNOSIS — K623 Rectal prolapse: Secondary | ICD-10-CM | POA: Diagnosis not present

## 2017-08-11 ENCOUNTER — Ambulatory Visit (INDEPENDENT_AMBULATORY_CARE_PROVIDER_SITE_OTHER): Payer: Medicare Other | Admitting: Medical

## 2017-08-11 ENCOUNTER — Ambulatory Visit (HOSPITAL_BASED_OUTPATIENT_CLINIC_OR_DEPARTMENT_OTHER)
Admission: RE | Admit: 2017-08-11 | Discharge: 2017-08-11 | Disposition: A | Discharge status: Home / Self Care | Payer: Medicare Other | Source: Ambulatory Visit | Attending: Medical | Admitting: Medical

## 2017-08-11 ENCOUNTER — Encounter: Payer: Self-pay | Admitting: Medical

## 2017-08-11 VITALS — BP 107/69 | HR 72 | Resp 16 | Ht 66.0 in | Wt 127.2 lb

## 2017-08-11 DIAGNOSIS — R944 Abnormal results of kidney function studies: Secondary | ICD-10-CM | POA: Diagnosis not present

## 2017-08-11 DIAGNOSIS — G8929 Other chronic pain: Secondary | ICD-10-CM | POA: Insufficient documentation

## 2017-08-11 DIAGNOSIS — M25512 Pain in left shoulder: Secondary | ICD-10-CM | POA: Insufficient documentation

## 2017-08-11 DIAGNOSIS — M25511 Pain in right shoulder: Secondary | ICD-10-CM | POA: Diagnosis not present

## 2017-08-11 DIAGNOSIS — M19012 Primary osteoarthritis, left shoulder: Secondary | ICD-10-CM | POA: Diagnosis not present

## 2017-08-11 MED ORDER — MELOXICAM 7.5 MG PO TABS: ORAL_TABLET | ORAL | 0 refills | Status: DC

## 2017-08-11 NOTE — Progress Notes (Signed)
Subjective:    Patient ID: [name], male    DOB: 11-Oct-1955, 62 y.o.   MRN: [id number]  HPI  Pt in with bilateral shoulder pain. He states has had shoulder pain off and on past year. Last 2 weeks both shoulder hurt more. Both shoulder sore and feel stiff.   Pt has neck pain and known degenerative c-spine.Takes tramadol for neck pain but not helping his shoulder.  Pt kidney function looks good. Pt alleve and ibuprofen and he states does not help his shoulders.  States pain in shoulder worse early morning and then late evening after work.   Review of Systems  Constitutional: Negative for chills, fatigue and fever.  Respiratory: Negative for cough, chest tightness, shortness of breath and wheezing.   Cardiovascular: Negative for chest pain and palpitations.  Gastrointestinal: Negative for abdominal pain.  Musculoskeletal: Positive for neck pain. Negative for back pain.       Bilateral shoulder pain.  Skin: Negative for rash.  Neurological: Negative for dizziness, speech difficulty, weakness, light-headedness, numbness and headaches.  Hematological: Negative for adenopathy. Does not bruise/bleed easily.  Psychiatric/Behavioral: Negative for behavioral problems, confusion, dysphoric mood and sleep disturbance. The patient is not nervous/anxious.    Past Medical History:  Diagnosis Date  . Anxiety   . Anxiety and depression 03/14/2010  . Colonic inertia    with chronic lifelong costipation  . Congenital fusion of cervical spine   . Depression   . FECAL INCONTINENCE 08/19/2007  . H/O: GI bleed   . Heart murmur    early childhood   . Hydrocephalus --per CT 04/2014 07/23/2014  . Idiopathic scoliosis   . 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 Pneumonia    childhood      Social History   Socioeconomic History  . Marital status: Single    Spouse name: Not on file  . Number of children: 0  . Years of education: Not on file  . Highest education level: Not on file  Social Needs    . Financial resource strain: Not on file  . Food insecurity - worry: Not on file  . Food insecurity - inability: Not on file  . Transportation needs - medical: Not on file  . Transportation needs - non-medical: Not on file  Occupational History  . 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 Smoking status: Never Smoker  . Smokeless tobacco: Never Used  Substance and Sexual Activity  . Alcohol use: No    Alcohol/week: 0.0 oz  . Drug use: No  . Sexual activity: Yes  Other Topics Concern  . Not on file  Social History Narrative   Lives w/ mother , has a younger brother and sister     Past Surgical History:  Procedure Laterality Date  . COLONOSCOPY  11/08/2006  . HARDWARE REMOVAL Right 10/23/2015   Procedure: RIGHT KNEE HARDWARE REMOVAL;  Surgeon: Ollen Gross, MD;  Location: [facility];  Service: Orthopedics;  Laterality: Right;  LMA  . HERNIA REPAIR  1980   left inguinal hernia  . LAPAROTOMY  07/18/2011   Procedure: EXPLORATORY LAPAROTOMY;  Surgeon: Mariella Saa, MD;  Location: [facility];  Service: General;  Laterality: N/A;  lysis of adhesions entero enterostomy  . LAPAROTOMY  07/28/2011   Procedure: EXPLORATORY LAPAROTOMY;  Surgeon: Mariella Saa, MD;  Location: [facility];  Service: General;  Laterality: N/A;  . Left Knee surgery  1994, 1992  . ORIF PATELLA Right 12/14/2014  Procedure: OPEN REDUCTION INTERNAL (ORIF) FIXATION PATELLA;  Surgeon: Ollen GrossFrank Aluisio, MD;  Location: [facility];  Service: Orthopedics;  Laterality: Right;  . Surgery for imperforate anus  1958  . TONSILLECTOMY    . UPPER GASTROINTESTINAL ENDOSCOPY  05/15/03    Family History  Problem Relation Age of Onset  . Alzheimer's disease Father   . Diabetes Mother   . Cancer Mother   . Colon cancer Neg Hx   . Prostate cancer Neg Hx     No Known Allergies  Current Outpatient Medications on File Prior to Visit  Medication Sig Dispense Refill  . traMADol (ULTRAM) 50 MG tablet  TAKE ONE TABLET BY MOUTH EVERY 12 HOURS AS NEEDED FOR MODERATE-TO-SEVERE PAIN 60 tablet 0   No current facility-administered medications on file prior to visit.     BP 107/69 (BP Location: Right Arm, Patient Position: Sitting, Cuff Size: Normal)   Pulse 72   Resp 16   Ht 5\' 6"  (1.676 m)   Wt 127 lb 3.2 oz (57.7 kg)   SpO2 100%   BMI 20.53 kg/m       Objective:   Physical Exam  General Mental Status- Alert. General Appearance- Not in acute distress.   Neck Carotid Arteries- Normal color. Moisture- Normal Moisture. No carotid bruits. No JVD.  Chest and Lung Exam Auscultation: Breath Sounds:-Normal.  Cardiovascular Auscultation:Rythm- Regular. Murmurs & Other Heart Sounds:Auscultation of the heart reveals- No Murmurs.  Abdomen Inspection:-Inspeection Normal. Palpation/Percussion:Note:No mass. Palpation and Percussion of the abdomen reveal- Non Tender, Non Distended + BS, no rebound or guarding.    Neurologic Cranial Nerve exam:- CN III-XII intact(No nystagmus), symmetric smile. Strength:- 5/5 equal and symmetric strength both upper and lower extremities.  Shoulders- both shoulder good range of motion. No crepitus. No pain on palpation.       Assessment & Plan:  For your bilateral shoulder pain, I do want to get x-rays of both.  Continue with tramadol for both the neck and shoulder pains.  But I am going to add meloxicam 7.5 mg tablets.  You can take 1-2 tablets a day if needed.  If  you do not have significant pain recommend not taking tablets as we did discussed benefits versus risk of medication.   We did discuss briefly exercise options.  I think slow movement exercises such as Pilates would keep in shape but not irritate any of your joints.  Follow-up in 3-4 weeks or as needed.

## 2017-08-11 NOTE — Patient Instructions (Addendum)
For your bilateral shoulder pain, I do want to get x-rays of both.  Continue with tramadol for both the neck and shoulder pains.  But I am going to add meloxicam 7.5 mg tablets.  You can take 1-2 tablets a day if needed.  If  you do not have significant pain recommend not taking tablets as we did discussed benefits versus risk of medication.   We did discuss briefly exercise options.  I think slow movement exercises such as Pilates would keep in shape but not irritate any of your joints.  Follow-up in 3-4 weeks or as needed.

## 2017-08-12 LAB — COMPREHENSIVE METABOLIC PANEL
ALT: 8 U/L (ref 0–53)
AST: 19 U/L (ref 0–37)
Albumin: 4.6 g/dL (ref 3.5–5.2)
Alkaline Phosphatase: 64 U/L (ref 39–117)
BUN: 10 mg/dL (ref 6–23)
CO2: 30 meq/L (ref 19–32)
Calcium: 9.4 mg/dL (ref 8.4–10.5)
Chloride: 104 mEq/L (ref 96–112)
Creatinine, Ser: 0.94 mg/dL (ref 0.40–1.50)
GFR: 86.83 mL/min (ref >60.00)
GLUCOSE: 92 mg/dL (ref 70–99)
Potassium: 4.1 mEq/L (ref 3.5–5.1)
SODIUM: 141 meq/L (ref 135–145)
Total Bilirubin: 0.2 mg/dL (ref 0.2–1.2)
Total Protein: 6.8 g/dL (ref 6.0–8.3)

## 2017-08-17 ENCOUNTER — Other Ambulatory Visit: Payer: Self-pay | Admitting: Medical

## 2017-08-17 DIAGNOSIS — M542 Cervicalgia: Secondary | ICD-10-CM

## 2017-08-18 NOTE — Telephone Encounter (Signed)
Refill of tramadol sent to pt pharmacy. 

## 2017-08-18 NOTE — Telephone Encounter (Signed)
Refill Request: Tramadol  Last RX:07/07/2017 Last OV:08/11/2016 Next ZH:YQMVOV:none scheduled  UDS:06/11/2017 CSC:06/11/2017

## 2017-09-27 ENCOUNTER — Telehealth: Payer: Self-pay | Admitting: Medical

## 2017-09-27 DIAGNOSIS — M542 Cervicalgia: Secondary | ICD-10-CM

## 2017-09-27 NOTE — Telephone Encounter (Signed)
Copied from CRM [id number]. Topic: Quick Communication - Rx Refill/Question >> Sep 27, 2017 10:38 AM Diana Eves B wrote: Medication: traMADol (ULTRAM) 50 MG tablet  Has the patient contacted their pharmacy? Yes.   (Agent: If no, request that the patient contact the pharmacy for the refill.) Preferred Pharmacy (with phone number or street name): [id number] - [place], [place] - [address] Agent: Please be advised that RX refills may take up to 3 business days. We ask that you follow-up with your pharmacy.

## 2017-09-29 MED ORDER — TRAMADOL HCL 50 MG PO TABS: ORAL_TABLET | ORAL | 0 refills | Status: DC

## 2017-09-29 NOTE — Telephone Encounter (Signed)
Called Patient and made appointment for 11-29-2017

## 2017-09-29 NOTE — Telephone Encounter (Signed)
Prescription of tramadol sent to patient's pharmacy.  Please have him follow-up with me sometime in July.  This should be required for controlled medication visit.

## 2017-09-29 NOTE — Telephone Encounter (Signed)
Requesting:Tramadol Contract:06/11/17 UDS:06/11/17 Last Visit:08/11/17 Next Visit:none with pcp/10/28/17 with Lawanna Kobus Last Refill:08/18/17  Please Advise

## 2017-10-22 NOTE — Progress Notes (Deleted)
Subjective:   [name] is a 62 y.o. male who presents for Medicare Annual/Subsequent preventive examination.  Review of Systems: No ROS.  Medicare Wellness Visit. Additional risk factors are reflected in the social history.    Sleep patterns:   Home Safety/Smoke Alarms: Feels safe in home. Smoke alarms in place.  Living environment; residence and Firearm Safety:   Male:   CCS-     PSA-  Lab Results  Component Value Date   PSA 0.19 12/09/2016   PSA 0.19 07/26/2014       Objective:    Vitals: There were no vitals taken for this visit.  There is no height or weight on file to calculate BMI.  Advanced Directives 10/27/2016 10/15/2015 05/13/2015 12/14/2014 12/11/2014 08/22/2014 05/09/2014  Does Patient Have a Medical Advance Directive? No No No No No No No  Copy of Healthcare Power of Attorney in Chart? - No - copy requested - - - - -  Would patient like information on creating a medical advance directive? Yes (MAU/Ambulatory/Procedural Areas - Information given) No - patient declined information - No - patient declined information - No - patient declined information No - patient declined information  Pre-existing out of facility DNR order (yellow form or pink MOST form) - - - - - - -    Tobacco Social History   Tobacco Use  Smoking Status Never Smoker  Smokeless Tobacco Never Used     Counseling given: Not Answered   Clinical Intake:                       Past Medical History:  Diagnosis Date  . Anxiety   . Anxiety and depression 03/14/2010  . Colonic inertia    with chronic lifelong costipation  . Congenital fusion of cervical spine   . Depression   . FECAL INCONTINENCE 08/19/2007  . H/O: GI bleed   . Heart murmur    early childhood   . Hydrocephalus --per CT 04/2014 07/23/2014  . Idiopathic scoliosis   . 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 Pneumonia    childhood    Past Surgical History:  Procedure Laterality Date  . COLONOSCOPY  11/08/2006  .  HARDWARE REMOVAL Right 10/23/2015   Procedure: RIGHT KNEE HARDWARE REMOVAL;  Surgeon: Ollen Gross, MD;  Location: [facility];  Service: Orthopedics;  Laterality: Right;  LMA  . HERNIA REPAIR  1980   left inguinal hernia  . LAPAROTOMY  07/18/2011   Procedure: EXPLORATORY LAPAROTOMY;  Surgeon: Mariella Saa, MD;  Location: [facility];  Service: General;  Laterality: N/A;  lysis of adhesions entero enterostomy  . LAPAROTOMY  07/28/2011   Procedure: EXPLORATORY LAPAROTOMY;  Surgeon: Mariella Saa, MD;  Location: [facility];  Service: General;  Laterality: N/A;  . Left Knee surgery  1994, 1992  . ORIF PATELLA Right 12/14/2014   Procedure: OPEN REDUCTION INTERNAL (ORIF) FIXATION PATELLA;  Surgeon: Ollen Gross, MD;  Location: [facility];  Service: Orthopedics;  Laterality: Right;  . Surgery for imperforate anus  1958  . TONSILLECTOMY    . UPPER GASTROINTESTINAL ENDOSCOPY  05/15/03   Family History  Problem Relation Age of Onset  . Alzheimer's disease Father   . Diabetes Mother   . Cancer Mother   . Colon cancer Neg Hx   . Prostate cancer Neg Hx    Social History   Socioeconomic History  . Marital status: Single    Spouse name: Not on file  . Number  of children: 0  . Years of education: Not on file  . Highest education level: Not on file  Occupational History  . 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 Financial resource strain: Not on file  . Food insecurity:    Worry: Not on file    Inability: Not on file  . Transportation needs:    Medical: Not on file    Non-medical: Not on file  Tobacco Use  . Smoking status: Never Smoker  . Smokeless tobacco: Never Used  Substance and Sexual Activity  . Alcohol use: No    Alcohol/week: 0.0 oz  . Drug use: No  . Sexual activity: Yes  Lifestyle  . Physical activity:    Days per week: Not on file    Minutes per session: Not on file  . Stress: Not on file  Relationships  . Social connections:    Talks  on phone: Not on file    Gets together: Not on file    Attends religious service: Not on file    Active member of club or organization: Not on file    Attends meetings of clubs or organizations: Not on file    Relationship status: Not on file  Other Topics Concern  . Not on file  Social History Narrative   Lives w/ mother , has a younger brother and sister     Outpatient Encounter Medications as of 10/28/2017  Medication Sig  . meloxicam (MOBIC) 7.5 MG tablet 1-2 tab po q day  . traMADol (ULTRAM) 50 MG tablet 1 tab po bid prn pain   No facility-administered encounter medications on file as of 10/28/2017.     Activities of Daily Living In your present state of health, do you have any difficulty performing the following activities: 10/27/2016  Hearing? N  Vision? N  Difficulty concentrating or making decisions? N  Walking or climbing stairs? N  Dressing or bathing? N  Doing errands, shopping? N  Preparing Food and eating ? N  Using the Toilet? N  In the past six months, have you accidently leaked urine? N  Do you have problems with loss of bowel control? N  Managing your Medications? N  Managing your Finances? N  Housekeeping or managing your Housekeeping? N  Some recent data might be hidden    Patient Care Team: Saguier, Kateri Mc as PCP - General (Physician Assistant)   Assessment:   This is a routine wellness examination for [name]. Physical assessment deferred to PCP.  Exercise Activities and Dietary recommendations   Diet (meal preparation, eat out, water intake, caffeinated beverages, dairy products, fruits and vegetables): {Desc; diets:16563} Breakfast: Lunch:  Dinner:      Goals    None      Fall Risk Fall Risk  05/31/2017 10/27/2016 06/28/2015 02/26/2015  Falls in the past year? No No No No    Depression Screen PHQ 2/9 Scores 05/31/2017 10/27/2016  PHQ - 2 Score 0 0    Cognitive Function MMSE - Mini Mental State Exam 02/26/2015  Orientation to time 5    Orientation to Place 5        Immunization History  Administered Date(s) Administered  . Tdap 07/23/2014    Screening Tests Health Maintenance  Topic Date Due  . Hepatitis C Screening  27-Mar-1956  . HIV Screening  12/29/1971  . COLONOSCOPY  12/29/2006  . INFLUENZA VACCINE  01/31/2018 (Originally 12/23/2017)  . TETANUS/TDAP  07/22/2024  Plan:   ***  I have personally reviewed and noted the following in the patient's chart:   . Medical and social history . Use of alcohol, tobacco or illicit drugs  . Current medications and supplements . Functional ability and status . Nutritional status . Physical activity . Advanced directives . List of other physicians . Hospitalizations, surgeries, and ER visits in previous 12 months . Vitals . Screenings to include cognitive, depression, and falls . Referrals and appointments  In addition, I have reviewed and discussed with patient certain preventive protocols, quality metrics, and best practice recommendations. A written personalized care plan for preventive services as well as general preventive health recommendations were provided to patient.     Avon Gully, [place]  10/22/2017

## 2017-10-28 ENCOUNTER — Encounter: Payer: Self-pay | Admitting: *Deleted

## 2017-10-28 ENCOUNTER — Telehealth: Payer: Self-pay | Admitting: Medical

## 2017-10-28 ENCOUNTER — Ambulatory Visit (INDEPENDENT_AMBULATORY_CARE_PROVIDER_SITE_OTHER): Payer: Medicare Other | Admitting: *Deleted

## 2017-10-28 ENCOUNTER — Ambulatory Visit: Payer: Medicare Other | Admitting: *Deleted

## 2017-10-28 VITALS — BP 110/60 | HR 85 | Ht 66.0 in | Wt 124.4 lb

## 2017-10-28 DIAGNOSIS — M542 Cervicalgia: Secondary | ICD-10-CM

## 2017-10-28 DIAGNOSIS — Z0000 Encounter for general adult medical examination without abnormal findings: Secondary | ICD-10-CM

## 2017-10-28 MED ORDER — TRAMADOL HCL 50 MG PO TABS: ORAL_TABLET | ORAL | 0 refills | Status: DC

## 2017-10-28 NOTE — Telephone Encounter (Signed)
Sent in tramadol to pharmacy. He need appointment next month.

## 2017-10-28 NOTE — Progress Notes (Addendum)
Subjective:   [name] is a 62 y.o. male who presents for Medicare Annual/Subsequent preventive examination.  Review of Systems: No ROS.  Medicare Wellness Visit. Additional risk factors are reflected in the social history. Cardiac Risk Factors include: advanced age (>100men, >18 women);male gender Sleep patterns:   Sleeps 7-8 hrs. Feels rested.  Home Safety/Smoke Alarms: Feels safe in home. Smoke alarms in place.  Living environment; residence and Firearm Safety: 1 story. Lives with Fiance and 3 cats.  Male:   CCS-   Pt states he will let us know in July if he wants it done.  PSA-  Lab Results  Component Value Date   PSA 0.19 12/09/2016   PSA 0.19 07/26/2014       Objective:    Vitals: BP 110/60 (BP Location: Left Arm, Patient Position: Sitting, Cuff Size: Normal)   Pulse 85   Ht 5\' 6"  (1.676 m)   Wt 124 lb 6.4 oz (56.4 kg)   SpO2 99%   BMI 20.08 kg/m   Body mass index is 20.08 kg/m.  Advanced Directives 10/28/2017 10/27/2016 10/15/2015 05/13/2015 12/14/2014 12/11/2014 08/22/2014  Does Patient Have a Medical Advance Directive? No No No No No No No  Copy of Healthcare Power of Attorney in Chart? - - No - copy requested - - - -  Would patient like information on creating a medical advance directive? Yes (MAU/Ambulatory/Procedural Areas - Information given) Yes (MAU/Ambulatory/Procedural Areas - Information given) No - patient declined information - No - patient declined information - No - patient declined information  Pre-existing out of facility DNR order (yellow form or pink MOST form) - - - - - - -    Tobacco Social History   Tobacco Use  Smoking Status Never Smoker  Smokeless Tobacco Never Used     Counseling given: Not Answered   Clinical Intake: Pain : No/denies pain   Past Medical History:  Diagnosis Date  . Anxiety   . Anxiety and depression 03/14/2010  . Colonic inertia    with chronic lifelong costipation  . Congenital fusion of cervical spine   .  Depression   . FECAL INCONTINENCE 08/19/2007  . H/O: GI bleed   . Heart murmur    early childhood   . Hydrocephalus --per CT 04/2014 07/23/2014  . Idiopathic scoliosis   . 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 Pneumonia    childhood    Past Surgical History:  Procedure Laterality Date  . COLONOSCOPY  11/08/2006  . HARDWARE REMOVAL Right 10/23/2015   Procedure: RIGHT KNEE HARDWARE REMOVAL;  Surgeon: Ollen Gross, MD;  Location: [facility];  Service: Orthopedics;  Laterality: Right;  LMA  . HERNIA REPAIR  1980   left inguinal hernia  . LAPAROTOMY  07/18/2011   Procedure: EXPLORATORY LAPAROTOMY;  Surgeon: Mariella Saa, MD;  Location: [facility];  Service: General;  Laterality: N/A;  lysis of adhesions entero enterostomy  . LAPAROTOMY  07/28/2011   Procedure: EXPLORATORY LAPAROTOMY;  Surgeon: Mariella Saa, MD;  Location: [facility];  Service: General;  Laterality: N/A;  . Left Knee surgery  1994, 1992  . ORIF PATELLA Right 12/14/2014   Procedure: OPEN REDUCTION INTERNAL (ORIF) FIXATION PATELLA;  Surgeon: Ollen Gross, MD;  Location: [facility];  Service: Orthopedics;  Laterality: Right;  . Surgery for imperforate anus  1958  . TONSILLECTOMY    . UPPER GASTROINTESTINAL ENDOSCOPY  05/15/03   Family History  Problem Relation Age of Onset  . Alzheimer's disease Father   .  Diabetes Mother   . Cancer Mother   . Colon cancer Neg Hx   . Prostate cancer Neg Hx    Social History   Socioeconomic History  . Marital status: Single    Spouse name: Not on file  . Number of children: 0  . Years of education: Not on file  . Highest education level: Not on file  Occupational History  . 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 Financial resource strain: Not on file  . Food insecurity:    Worry: Not on file    Inability: Not on file  . Transportation needs:    Medical: Not on file    Non-medical: Not on file  Tobacco Use  . Smoking status: Never Smoker  .  Smokeless tobacco: Never Used  Substance and Sexual Activity  . Alcohol use: No    Alcohol/week: 0.0 oz  . Drug use: No  . Sexual activity: Yes  Lifestyle  . Physical activity:    Days per week: Not on file    Minutes per session: Not on file  . Stress: Not on file  Relationships  . Social connections:    Talks on phone: Not on file    Gets together: Not on file    Attends religious service: Not on file    Active member of club or organization: Not on file    Attends meetings of clubs or organizations: Not on file    Relationship status: Not on file  Other Topics Concern  . Not on file  Social History Narrative   Lives w/ mother , has a younger brother and sister     Outpatient Encounter Medications as of 10/28/2017  Medication Sig  . traMADol (ULTRAM) 50 MG tablet 1 tab po bid prn pain  . meloxicam (MOBIC) 7.5 MG tablet 1-2 tab po q day (Patient not taking: Reported on 10/28/2017)   No facility-administered encounter medications on file as of 10/28/2017.     Activities of Daily Living In your present state of health, do you have any difficulty performing the following activities: 10/28/2017  Hearing? N  Vision? N  Comment wears glasses for reading  Difficulty concentrating or making decisions? N  Walking or climbing stairs? N  Dressing or bathing? N  Doing errands, shopping? N  Preparing Food and eating ? N  Using the Toilet? N  In the past six months, have you accidently leaked urine? N  Do you have problems with loss of bowel control? N  Managing your Medications? N  Managing your Finances? N  Housekeeping or managing your Housekeeping? N  Some recent data might be hidden    Patient Care Team: Saguier, Kateri McEdward, PA-C as PCP - General (Physician Assistant)   Assessment:   This is a routine wellness examination for [name]. Physical assessment deferred to PCP.  Exercise Activities and Dietary recommendations Current Exercise Habits: Home exercise routine, Type of  exercise: walking, Time (Minutes): 30, Frequency (Times/Week): 7, Weekly Exercise (Minutes/Week): 210, Intensity: Mild, Exercise limited by: None identified Diet (meal preparation, eat out, water intake, caffeinated beverages, dairy products, fruits and vegetables): in general, a "healthy" diet     Goals    . Gain weight     Gain at least 10 lbs       Fall Risk Fall Risk  10/28/2017 05/31/2017 10/27/2016 06/28/2015 02/26/2015  Falls in the past year? No No No No No    Depression Screen PHQ 2/9  Scores 10/28/2017 05/31/2017 10/27/2016  PHQ - 2 Score 0 0 0    Cognitive Function Ad8 score reviewed for issues:  Issues making decisions:no  Less interest in hobbies / activities:no  Repeats questions, stories (family complaining):no  Trouble using ordinary gadgets (microwave, computer, phone):no  Forgets the month or year: no  Mismanaging finances: no  Remembering appts:no Daily problems with thinking and/or memory:no Ad8 score is=0     MMSE - Mini Mental State Exam 02/26/2015  Orientation to time 5  Orientation to Place 5        Immunization History  Administered Date(s) Administered  . Tdap 07/23/2014    Screening Tests Health Maintenance  Topic Date Due  . Hepatitis C Screening  01/05/1956  . HIV Screening  12/29/1971  . COLONOSCOPY  12/29/2006  . INFLUENZA VACCINE  01/31/2018 (Originally 12/23/2017)  . TETANUS/TDAP  07/22/2024        Plan:    Please schedule your next medicare wellness visit with me in 1 yr.  Continue to eat heart healthy diet (full of fruits, vegetables, whole grains, lean protein, water--limit salt, fat, and sugar intake) and increase physical activity as tolerated.  Continue doing brain stimulating activities (puzzles, reading, adult coloring books, staying active) to keep memory sharp.   Bring a copy of your living will and/or healthcare power of attorney to your next office visit.  Consider having colonoscopy.   I have personally reviewed  and noted the following in the patient's chart:   . Medical and social history . Use of alcohol, tobacco or illicit drugs  . Current medications and supplements . Functional ability and status . Nutritional status . Physical activity . Advanced directives . List of other physicians . Hospitalizations, surgeries, and ER visits in previous 12 months . Vitals . Screenings to include cognitive, depression, and falls . Referrals and appointments  In addition, I have reviewed and discussed with patient certain preventive protocols, quality metrics, and best practice recommendations. A written personalized care plan for preventive services as well as general preventive health recommendations were provided to patient.     Avon Gully, [place]  10/28/2017  Reviewed and Agree with evaluation/recommendation of RN. Esperanza Richters, PA-C

## 2017-10-28 NOTE — Patient Instructions (Signed)
Please schedule your next medicare wellness visit with me in 1 yr.  Continue to eat heart healthy diet (full of fruits, vegetables, whole grains, lean protein, water--limit salt, fat, and sugar intake) and increase physical activity as tolerated.  Continue doing brain stimulating activities (puzzles, reading, adult coloring books, staying active) to keep memory sharp.   Bring a copy of your living will and/or healthcare power of attorney to your next office visit.  Consider having colonoscopy.    [name] , Thank you for taking time to come for your Medicare Wellness Visit. I appreciate your ongoing commitment to your health goals. Please review the following plan we discussed and let me know if I can assist you in the future.   These are the goals we discussed: Goals    . Gain weight     Gain at least 10 lbs       This is a list of the screening recommended for you and due dates:  Health Maintenance  Topic Date Due  .  Hepatitis C: One time screening is recommended by Center for Disease Control  (CDC) for  adults born from [birth year] through 1964.   05/28/1955  . HIV Screening  12/29/1971  . Colon Cancer Screening  12/29/2006  . Flu Shot  01/31/2018*  . Tetanus Vaccine  07/22/2024  *Topic was postponed. The date shown is not the original due date.    Health Maintenance, Male A healthy lifestyle and preventive care is important for your health and wellness. Ask your health care provider about what schedule of regular examinations is right for you. What should I know about weight and diet? Eat a Healthy Diet  Eat plenty of vegetables, fruits, whole grains, low-fat dairy products, and lean protein.  Do not eat a lot of foods high in solid fats, added sugars, or salt.  Maintain a Healthy Weight Regular exercise can help you achieve or maintain a healthy weight. You should:  Do at least 150 minutes of exercise each week. The exercise should increase your heart rate and make you  sweat (moderate-intensity exercise).  Do strength-training exercises at least twice a week.  Watch Your Levels of Cholesterol and Blood Lipids  Have your blood tested for lipids and cholesterol every 5 years starting at 61 years of age. If you are at high risk for heart disease, you should start having your blood tested when you are 61 years old. You may need to have your cholesterol levels checked more often if: ? Your lipid or cholesterol levels are high. ? You are older than 61 years of age. ? You are at high risk for heart disease.  What should I know about cancer screening? Many types of cancers can be detected early and may often be prevented. Lung Cancer  You should be screened every year for lung cancer if: ? You are a current smoker who has smoked for at least 30 years. ? You are a former smoker who has quit within the past 15 years.  Talk to your health care provider about your screening options, when you should start screening, and how often you should be screened.  Colorectal Cancer  Routine colorectal cancer screening usually begins at 62 years of age and should be repeated every 5-10 years until you are 61 years old. You may need to be screened more often if early forms of precancerous polyps or small growths are found. Your health care provider may recommend screening at an earlier age if you  have risk factors for colon cancer.  Your health care provider may recommend using home test kits to check for hidden blood in the stool.  A small camera at the end of a tube can be used to examine your colon (sigmoidoscopy or colonoscopy). This checks for the earliest forms of colorectal cancer.  Prostate and Testicular Cancer  Depending on your age and overall health, your health care provider may do certain tests to screen for prostate and testicular cancer.  Talk to your health care provider about any symptoms or concerns you have about testicular or prostate cancer.  Skin  Cancer  Check your skin from head to toe regularly.  Tell your health care provider about any new moles or changes in moles, especially if: ? There is a change in a mole's size, shape, or color. ? You have a mole that is larger than a pencil eraser.  Always use sunscreen. Apply sunscreen liberally and repeat throughout the day.  Protect yourself by wearing long sleeves, pants, a wide-brimmed hat, and sunglasses when outside.  What should I know about heart disease, diabetes, and high blood pressure?  If you are 59-55 years of age, have your blood pressure checked every 3-5 years. If you are 20 years of age or older, have your blood pressure checked every year. You should have your blood pressure measured twice-once when you are at a hospital or clinic, and once when you are not at a hospital or clinic. Record the average of the two measurements. To check your blood pressure when you are not at a hospital or clinic, you can use: ? An automated blood pressure machine at a pharmacy. ? A home blood pressure monitor.  Talk to your health care provider about your target blood pressure.  If you are between 77-86 years old, ask your health care provider if you should take aspirin to prevent heart disease.  Have regular diabetes screenings by checking your fasting blood sugar level. ? If you are at a normal weight and have a low risk for diabetes, have this test once every three years after the age of 20. ? If you are overweight and have a high risk for diabetes, consider being tested at a younger age or more often.  A one-time screening for abdominal aortic aneurysm (AAA) by ultrasound is recommended for men aged 57-75 years who are current or former smokers. What should I know about preventing infection? Hepatitis B If you have a higher risk for hepatitis B, you should be screened for this virus. Talk with your health care provider to find out if you are at risk for hepatitis B  infection. Hepatitis C Blood testing is recommended for:  Everyone born from [birth year] through 1964.  Anyone with known risk factors for hepatitis C.  Sexually Transmitted Diseases (STDs)  You should be screened each year for STDs including gonorrhea and chlamydia if: ? You are sexually active and are younger than 61 years of age. ? You are older than 61 years of age and your health care provider tells you that you are at risk for this type of infection. ? Your sexual activity has changed since you were last screened and you are at an increased risk for chlamydia or gonorrhea. Ask your health care provider if you are at risk.  Talk with your health care provider about whether you are at high risk of being infected with HIV. Your health care provider may recommend a prescription medicine to help prevent HIV  infection.  What else can I do?  Schedule regular health, dental, and eye exams.  Stay current with your vaccines (immunizations).  Do not use any tobacco products, such as cigarettes, chewing tobacco, and e-cigarettes. If you need help quitting, ask your health care provider.  Limit alcohol intake to no more than 2 drinks per day. One drink equals 12 ounces of beer, 5 ounces of wine, or 1 ounces of hard liquor.  Do not use street drugs.  Do not share needles.  Ask your health care provider for help if you need support or information about quitting drugs.  Tell your health care provider if you often feel depressed.  Tell your health care provider if you have ever been abused or do not feel safe at home. This information is not intended to replace advice given to you by your health care provider. Make sure you discuss any questions you have with your health care provider. Document Released: 11/07/2007 Document Revised: 01/08/2016 Document Reviewed: 02/12/2015 Elsevier Interactive Patient Education  2018 [employer].  Health Maintenance, Male A healthy lifestyle and preventive  care is important for your health and wellness. Ask your health care provider about what schedule of regular examinations is right for you. What should I know about weight and diet? Eat a Healthy Diet  Eat plenty of vegetables, fruits, whole grains, low-fat dairy products, and lean protein.  Do not eat a lot of foods high in solid fats, added sugars, or salt.  Maintain a Healthy Weight Regular exercise can help you achieve or maintain a healthy weight. You should:  Do at least 150 minutes of exercise each week. The exercise should increase your heart rate and make you sweat (moderate-intensity exercise).  Do strength-training exercises at least twice a week.  Watch Your Levels of Cholesterol and Blood Lipids  Have your blood tested for lipids and cholesterol every 5 years starting at 61 years of age. If you are at high risk for heart disease, you should start having your blood tested when you are 61 years old. You may need to have your cholesterol levels checked more often if: ? Your lipid or cholesterol levels are high. ? You are older than 61 years of age. ? You are at high risk for heart disease.  What should I know about cancer screening? Many types of cancers can be detected early and may often be prevented. Lung Cancer  You should be screened every year for lung cancer if: ? You are a current smoker who has smoked for at least 30 years. ? You are a former smoker who has quit within the past 15 years.  Talk to your health care provider about your screening options, when you should start screening, and how often you should be screened.  Colorectal Cancer  Routine colorectal cancer screening usually begins at 61 years of age and should be repeated every 5-10 years until you are 61 years old. You may need to be screened more often if early forms of precancerous polyps or small growths are found. Your health care provider may recommend screening at an earlier age if you have risk  factors for colon cancer.  Your health care provider may recommend using home test kits to check for hidden blood in the stool.  A small camera at the end of a tube can be used to examine your colon (sigmoidoscopy or colonoscopy). This checks for the earliest forms of colorectal cancer.  Prostate and Testicular Cancer  Depending on your  age and overall health, your health care provider may do certain tests to screen for prostate and testicular cancer.  Talk to your health care provider about any symptoms or concerns you have about testicular or prostate cancer.  Skin Cancer  Check your skin from head to toe regularly.  Tell your health care provider about any new moles or changes in moles, especially if: ? There is a change in a mole's size, shape, or color. ? You have a mole that is larger than a pencil eraser.  Always use sunscreen. Apply sunscreen liberally and repeat throughout the day.  Protect yourself by wearing long sleeves, pants, a wide-brimmed hat, and sunglasses when outside.  What should I know about heart disease, diabetes, and high blood pressure?  If you are 118-61 years of age, have your blood pressure checked every 3-5 years. If you are 61 years of age or older, have your blood pressure checked every year. You should have your blood pressure measured twice-once when you are at a hospital or clinic, and once when you are not at a hospital or clinic. Record the average of the two measurements. To check your blood pressure when you are not at a hospital or clinic, you can use: ? An automated blood pressure machine at a pharmacy. ? A home blood pressure monitor.  Talk to your health care provider about your target blood pressure.  If you are between 5645-61 years old, ask your health care provider if you should take aspirin to prevent heart disease.  Have regular diabetes screenings by checking your fasting blood sugar level. ? If you are at a normal weight and have a  low risk for diabetes, have this test once every three years after the age of 61. ? If you are overweight and have a high risk for diabetes, consider being tested at a younger age or more often.  A one-time screening for abdominal aortic aneurysm (AAA) by ultrasound is recommended for men aged 65-75 years who are current or former smokers. What should I know about preventing infection? Hepatitis B If you have a higher risk for hepatitis B, you should be screened for this virus. Talk with your health care provider to find out if you are at risk for hepatitis B infection. Hepatitis C Blood testing is recommended for:  Everyone born from [id number] through 1964.  Anyone with known risk factors for hepatitis C.  Sexually Transmitted Diseases (STDs)  You should be screened each year for STDs including gonorrhea and chlamydia if: ? You are sexually active and are younger than 61 years of age. ? You are older than 61 years of age and your health care provider tells you that you are at risk for this type of infection. ? Your sexual activity has changed since you were last screened and you are at an increased risk for chlamydia or gonorrhea. Ask your health care provider if you are at risk.  Talk with your health care provider about whether you are at high risk of being infected with HIV. Your health care provider may recommend a prescription medicine to help prevent HIV infection.  What else can I do?  Schedule regular health, dental, and eye exams.  Stay current with your vaccines (immunizations).  Do not use any tobacco products, such as cigarettes, chewing tobacco, and e-cigarettes. If you need help quitting, ask your health care provider.  Limit alcohol intake to no more than 2 drinks per day. One drink equals 12 ounces of  beer, 5 ounces of wine, or 1 ounces of hard liquor.  Do not use street drugs.  Do not share needles.  Ask your health care provider for help if you need support or  information about quitting drugs.  Tell your health care provider if you often feel depressed.  Tell your health care provider if you have ever been abused or do not feel safe at home. This information is not intended to replace advice given to you by your health care provider. Make sure you discuss any questions you have with your health care provider. Document Released: 11/07/2007 Document Revised: 01/08/2016 Document Reviewed: 02/12/2015 Elsevier Interactive Patient Education  [employer].

## 2017-10-28 NOTE — Telephone Encounter (Signed)
Copied from CRM [id number]. Topic: Quick Communication - Other Results >> Oct 28, 2017  4:33 PM Ninfa MeekerPoole, Bridgett H wrote:  Pt needs refill on traMADol (ULTRAM) 50 MG tablet.

## 2017-10-29 NOTE — Telephone Encounter (Signed)
Patient has appointment for 11-29-2017.

## 2017-11-29 ENCOUNTER — Encounter: Payer: Self-pay | Admitting: Medical

## 2017-11-29 ENCOUNTER — Ambulatory Visit (INDEPENDENT_AMBULATORY_CARE_PROVIDER_SITE_OTHER): Payer: Medicare Other | Admitting: Medical

## 2017-11-29 VITALS — BP 117/73 | HR 90 | Temp 97.5°F | Resp 16 | Ht 66.0 in | Wt 122.6 lb

## 2017-11-29 DIAGNOSIS — R42 Dizziness and giddiness: Secondary | ICD-10-CM

## 2017-11-29 DIAGNOSIS — Z79899 Other long term (current) drug therapy: Secondary | ICD-10-CM

## 2017-11-29 DIAGNOSIS — M542 Cervicalgia: Secondary | ICD-10-CM

## 2017-11-29 MED ORDER — TRAMADOL HCL 50 MG PO TABS: ORAL_TABLET | ORAL | 0 refills | Status: DC

## 2017-11-29 NOTE — Patient Instructions (Addendum)
For severe neck pain(see neck xray), I refilled your tramadol. Please give uds today.  Up to date on contract for controlled meds.  For light headed sensation that occurs rarely will get cbc and cmp. I think good idea to have snack every 5 hours or so since you report eating improved symptoms. May have had low sugar events.  Follow up in 3-4 months or as needed

## 2017-11-29 NOTE — Progress Notes (Signed)
Subjective:    Patient ID: [name], male    DOB: 12/14/1955, 62 y.o.   MRN: [id number]  HPI  Pt in for refill of Tramadol. He used for severe neck pain related to degenerative changes. He gets by with 1 tab twice a day treatment.  Prior xray report IMPRESSION: 1.  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  2. 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 The cervical vertebrae are numbered accordingly. Mild retrolisthesis C4 on C5 of approximately 3 mm noted. Severe multilevel degenerative change. No evidence of fracture or dislocation.  He is up to date on contract. Needs to give uds.   Review of Systems  Constitutional: Negative for chills, fatigue and fever.  Respiratory: Negative for cough, chest tightness, shortness of breath and wheezing.   Cardiovascular: Negative for chest pain and palpitations.  Gastrointestinal: Negative for abdominal distention, blood in stool, constipation, diarrhea, nausea and vomiting.  Musculoskeletal: Positive for neck pain. Negative for back pain and neck stiffness.  Skin: Negative for rash.  Neurological: Negative for dizziness and facial asymmetry.       On and off very rare occasional light headed. Very rare. Notes correlation that when he eats will better and light headed sensation resolves.  Hematological: Negative for adenopathy. Does not bruise/bleed easily.  Psychiatric/Behavioral: Negative for behavioral problems and confusion.    Past Medical History:  Diagnosis Date  . Anxiety   . Anxiety and depression 03/14/2010  . Colonic inertia    with chronic lifelong costipation  . Congenital fusion of cervical spine   . Depression   . FECAL INCONTINENCE 08/19/2007  . H/O: GI bleed   . Heart murmur    early childhood   . Hydrocephalus --per CT 04/2014 07/23/2014  . Idiopathic scoliosis   . 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 Pneumonia    childhood      Social History   Socioeconomic History  . Marital status: Single    Spouse name: Not  on file  . Number of children: 0  . Years of education: Not on file  . Highest education level: Not on file  Occupational History  . 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 Financial resource strain: Not on file  . Food insecurity:    Worry: Not on file    Inability: Not on file  . Transportation needs:    Medical: Not on file    Non-medical: Not on file  Tobacco Use  . Smoking status: Never Smoker  . Smokeless tobacco: Never Used  Substance and Sexual Activity  . Alcohol use: No    Alcohol/week: 0.0 oz  . Drug use: No  . Sexual activity: Yes  Lifestyle  . Physical activity:    Days per week: Not on file    Minutes per session: Not on file  . Stress: Not on file  Relationships  . Social connections:    Talks on phone: Not on file    Gets together: Not on file    Attends religious service: Not on file    Active member of club or organization: Not on file    Attends meetings of clubs or organizations: Not on file    Relationship status: Not on file  . Intimate partner violence:    Fear of current or ex partner: Not on file    Emotionally abused: Not on file    Physically abused: Not on file    Forced sexual activity: Not on  file  Other Topics Concern  . Not on file  Social History Narrative   Lives w/ mother , has a younger brother and sister     Past Surgical History:  Procedure Laterality Date  . COLONOSCOPY  11/08/2006  . HARDWARE REMOVAL Right 10/23/2015   Procedure: RIGHT KNEE HARDWARE REMOVAL;  Surgeon: Ollen Gross, MD;  Location: [facility];  Service: Orthopedics;  Laterality: Right;  LMA  . HERNIA REPAIR  1980   left inguinal hernia  . LAPAROTOMY  07/18/2011   Procedure: EXPLORATORY LAPAROTOMY;  Surgeon: Mariella Saa, MD;  Location: [facility];  Service: General;  Laterality: N/A;  lysis of adhesions entero enterostomy  . LAPAROTOMY  07/28/2011   Procedure: EXPLORATORY LAPAROTOMY;  Surgeon: Mariella Saa, MD;   Location: [facility];  Service: General;  Laterality: N/A;  . Left Knee surgery  1994, 1992  . ORIF PATELLA Right 12/14/2014   Procedure: OPEN REDUCTION INTERNAL (ORIF) FIXATION PATELLA;  Surgeon: Ollen Gross, MD;  Location: [facility];  Service: Orthopedics;  Laterality: Right;  . Surgery for imperforate anus  1958  . TONSILLECTOMY    . UPPER GASTROINTESTINAL ENDOSCOPY  05/15/03    Family History  Problem Relation Age of Onset  . Alzheimer's disease Father   . Diabetes Mother   . Cancer Mother   . Colon cancer Neg Hx   . Prostate cancer Neg Hx     No Known Allergies  Current Outpatient Medications on File Prior to Visit  Medication Sig Dispense Refill  . traMADol (ULTRAM) 50 MG tablet 1 tab po bid prn pain 60 tablet 0  . meloxicam (MOBIC) 7.5 MG tablet 1-2 tab po q day (Patient not taking: Reported on 10/28/2017) 30 tablet 0   No current facility-administered medications on file prior to visit.     BP 117/73   Pulse 90   Temp (!) 97.5 F (36.4 C) (Oral)   Resp 16   Ht 5\' 6"  (1.676 m)   Wt 122 lb 9.6 oz (55.6 kg)   SpO2 100%   BMI 19.79 kg/m       Objective:   Physical Exam   General Mental Status- Alert. General Appearance- Not in acute distress.   Skin General: Color- Normal Color. Moisture- Normal Moisture.  Neck Carotid Arteries- Normal color. Moisture- Normal Moisture. No carotid bruits. No JVD.  Chest and Lung Exam Auscultation: Breath Sounds:-Normal.  Cardiovascular Auscultation:Rythm- Regular. Murmurs & Other Heart Sounds:Auscultation of the heart reveals- No Murmurs.   Neurologic Cranial Nerve exam:- CN III-XII intact(No nystagmus), symmetric smile. Strength:- 5/5 equal and symmetric strength both upper and lower extremities.     Assessment & Plan:  For severe neck pain, I refilled your tramadol. Please give uds today.  Up to date on contract for controlled meds.  For light headed sensation that occurs rarely will get cbc and cmp. I think good  idea to have snack every 5 hours or so since you report eating improved symptoms. May have had low sugar events.  Follow up in 3-4 months or as needed  [employer], [employer]

## 2017-11-30 LAB — PAIN MGMT, PROFILE 8 W/CONF, U
6 ACETYLMORPHINE: NEGATIVE ng/mL (ref <10)
AMPHETAMINES: NEGATIVE ng/mL (ref <500)
Alcohol Metabolites: NEGATIVE ng/mL (ref <500)
BUPRENORPHINE, URINE: NEGATIVE ng/mL (ref <5)
Benzodiazepines: NEGATIVE ng/mL (ref <100)
Cocaine Metabolite: NEGATIVE ng/mL (ref <150)
Creatinine: 74.6 mg/dL
MARIJUANA METABOLITE: NEGATIVE ng/mL (ref <20)
MDMA: NEGATIVE ng/mL (ref <500)
OPIATES: NEGATIVE ng/mL (ref <100)
Oxidant: NEGATIVE ug/mL (ref <200)
Oxycodone: NEGATIVE ng/mL (ref <100)
pH: 6.77 (ref 4.5–9.0)

## 2017-11-30 LAB — COMPREHENSIVE METABOLIC PANEL
ALT: 10 U/L (ref 0–53)
AST: 17 U/L (ref 0–37)
Albumin: 4.3 g/dL (ref 3.5–5.2)
Alkaline Phosphatase: 49 U/L (ref 39–117)
BUN: 11 mg/dL (ref 6–23)
CO2: 31 mEq/L (ref 19–32)
Calcium: 9.4 mg/dL (ref 8.4–10.5)
Chloride: 102 mEq/L (ref 96–112)
Creatinine, Ser: 0.85 mg/dL (ref 0.40–1.50)
GFR: 97.42 mL/min (ref >60.00)
GLUCOSE: 91 mg/dL (ref 70–99)
POTASSIUM: 4 meq/L (ref 3.5–5.1)
Sodium: 138 mEq/L (ref 135–145)
Total Bilirubin: 0.5 mg/dL (ref 0.2–1.2)
Total Protein: 6.5 g/dL (ref 6.0–8.3)

## 2017-11-30 LAB — CBC WITH DIFFERENTIAL/PLATELET
BASOS PCT: 1 % (ref 0.0–3.0)
Basophils Absolute: 0 10*3/uL (ref 0.0–0.1)
Eosinophils Absolute: 0 10*3/uL (ref 0.0–0.7)
Eosinophils Relative: 1 % (ref 0.0–5.0)
HCT: 40.1 % (ref 39.0–52.0)
Hemoglobin: 13.5 g/dL (ref 13.0–17.0)
LYMPHS ABS: 0.9 10*3/uL (ref 0.7–4.0)
Lymphocytes Relative: 21.1 % (ref 12.0–46.0)
MCHC: 33.6 g/dL (ref 30.0–36.0)
MCV: 82.2 fl (ref 78.0–100.0)
MONOS PCT: 4.2 % (ref 3.0–12.0)
Monocytes Absolute: 0.2 10*3/uL (ref 0.1–1.0)
NEUTROS ABS: 3.1 10*3/uL (ref 1.4–7.7)
NEUTROS PCT: 72.7 % (ref 43.0–77.0)
Platelets: 244 10*3/uL (ref 150.0–400.0)
RBC: 4.88 Mil/uL (ref 4.22–5.81)
RDW: 14.4 % (ref 11.5–15.5)
WBC: 4.3 10*3/uL (ref 4.0–10.5)

## 2017-12-02 ENCOUNTER — Telehealth: Payer: Self-pay | Admitting: Medical

## 2017-12-02 NOTE — Telephone Encounter (Signed)
Copied from CRM [phone]. Topic: Quick Communication - Office Called Patient >> Dec 02, 2017 10:31 AM Stephannie LiSimmons, Rowen Hur L, NT wrote: Reason for CRM: Patient returned called and would like a message left .

## 2018-01-07 ENCOUNTER — Other Ambulatory Visit: Payer: Self-pay | Admitting: Medical

## 2018-01-07 DIAGNOSIS — M542 Cervicalgia: Secondary | ICD-10-CM

## 2018-01-07 NOTE — Telephone Encounter (Signed)
Refill Request: Tramadol  Last RX:11/29/17 Last OV:11/29/17 Next ZO:[id number]:None scheduled  UDS:11/29/17 CSC:06/11/17

## 2018-01-08 NOTE — Telephone Encounter (Signed)
Rx tramadol sent to pt pharmacy. 

## 2018-02-07 ENCOUNTER — Encounter: Payer: Self-pay | Admitting: Medical

## 2018-02-07 ENCOUNTER — Ambulatory Visit (INDEPENDENT_AMBULATORY_CARE_PROVIDER_SITE_OTHER): Payer: Medicare Other | Admitting: Medical

## 2018-02-07 ENCOUNTER — Ambulatory Visit: Payer: Self-pay | Admitting: Medical

## 2018-02-07 VITALS — BP 105/65 | HR 86 | Temp 98.1°F | Resp 16 | Ht 66.0 in | Wt 116.8 lb

## 2018-02-07 DIAGNOSIS — R195 Other fecal abnormalities: Secondary | ICD-10-CM | POA: Diagnosis not present

## 2018-02-07 DIAGNOSIS — M542 Cervicalgia: Secondary | ICD-10-CM | POA: Diagnosis not present

## 2018-02-07 MED ORDER — TRAMADOL HCL 50 MG PO TABS
ORAL_TABLET | ORAL | 0 refills | Status: DC
Start: 2018-02-07 — End: 2018-03-10

## 2018-02-07 NOTE — Patient Instructions (Addendum)
For your history of recent mild loose stools, I want you to use Metamucil 1 rounded tablespoon in 8 ounces water 3 times daily.  If by chance you have a day where you have more loose stools and you feel that it gets in the way of your work and you could use one Imodium tablet over-the-counter.   This is different pattern than your typical history of chronic constipation with your history prior GI surgeries.  I just want you to keep in mind that we need to make sure that you are not having any consecutive days of constipation in the future followed by loose stools.  In that event we would need to get imaging studies to rule out obstruction.  Presently you mention seeing also some product advertised on TV that you thought might help you.  If you see that advertisement again then please get the name and call or MyChart me that name.  Also referring you to a GI upstairs might be an option.  For chronic neck pain, I am refilling your tramadol.  Glad to hear that your pain in the neck is controlled with tramadol and that is not causing you constipation.  Follow-up in 3 to 4 weeks or as needed.

## 2018-02-07 NOTE — Progress Notes (Signed)
Subjective:    Patient ID: [name], male    DOB: 02/19/1956, 62 y.o.   MRN: [id number]  HPI  Pt in for follow up.  Pt states in for discussion of meds to help with  History of irregular bowel movement. Pt has history of imperforate anus with surgery. Pt in past had been given linzess in the past for chronic constipation. But he states medication was too expensive. Pt saw commercial for some product but he can't think of the name.  He thinks this might normalize his stool.   On further discussion he actually states stools are soft and runny. So much so that he feels he might have accident. He misses work on  Saturday, Sunday and Monday/today.  He will miss work about 3 days every 2 months historically per pt.  Pt does report history of using metamucil in past. But he has not used that recently.   Pt clears up that he has not had constipation at all despite not being on an linzess.. Also despite using tramadol.   Pt had bm yesterday was normal volume. Most of time stools are soft and runny. He actually complains that bm are too soft. But not diarrhea like. He expresses desire to have better formed stools.  He clarifies that his problems is not constipation as he formerly had.   Also he clarifies no soft or watery following days of constipation.   He only uses tramadol twice a day. He has known hx of known degenerative spine neck.   1.  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  2. 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 The cervical vertebrae are numbered accordingly. Mild retrolisthesis C4 on C5 of approximately 3 mm noted. Severe multilevel degenerative change. No evidence of fracture or dislocation.     Review of Systems  Constitutional: Negative for chills, diaphoresis, fatigue and fever.  Respiratory: Negative for cough, chest tightness, shortness of breath and wheezing.   Cardiovascular: Negative for chest pain and palpitations.  Gastrointestinal: Negative for  abdominal pain, blood in stool, constipation, diarrhea, nausea and vomiting.       Softer stools but no diarrhea. Also denies recent constipation.  Musculoskeletal: Positive for neck pain. Negative for back pain and joint swelling.  Skin: Negative for rash.  Hematological: Negative for adenopathy. Does not bruise/bleed easily.  Psychiatric/Behavioral: Negative for behavioral problems, confusion and sleep disturbance. The patient is not nervous/anxious.    Past Medical History:  Diagnosis Date  . Anxiety   . Anxiety and depression 03/14/2010  . Colonic inertia    with chronic lifelong costipation  . Congenital fusion of cervical spine   . Depression   . FECAL INCONTINENCE 08/19/2007  . H/O: GI bleed   . Heart murmur    early childhood   . Hydrocephalus --per CT 04/2014 07/23/2014  . Idiopathic scoliosis   . 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 Pneumonia    childhood      Social History   Socioeconomic History  . Marital status: Single    Spouse name: Not on file  . Number of children: 0  . Years of education: Not on file  . Highest education level: Not on file  Occupational History  . 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 Financial resource strain: Not on file  . Food insecurity:    Worry: Not on file    Inability: Not on file  . Transportation needs:    Medical: Not  on file    Non-medical: Not on file  Tobacco Use  . Smoking status: Never Smoker  . Smokeless tobacco: Never Used  Substance and Sexual Activity  . Alcohol use: No    Alcohol/week: 0.0 standard drinks  . Drug use: No  . Sexual activity: Yes  Lifestyle  . Physical activity:    Days per week: Not on file    Minutes per session: Not on file  . Stress: Not on file  Relationships  . Social connections:    Talks on phone: Not on file    Gets together: Not on file    Attends religious service: Not on file    Active member of club or organization: Not on file    Attends  meetings of clubs or organizations: Not on file    Relationship status: Not on file  . Intimate partner violence:    Fear of current or ex partner: Not on file    Emotionally abused: Not on file    Physically abused: Not on file    Forced sexual activity: Not on file  Other Topics Concern  . Not on file  Social History Narrative   Lives w/ mother , has a younger brother and sister     Past Surgical History:  Procedure Laterality Date  . COLONOSCOPY  11/08/2006  . HARDWARE REMOVAL Right 10/23/2015   Procedure: RIGHT KNEE HARDWARE REMOVAL;  Surgeon: Ollen Gross, MD;  Location: [facility];  Service: Orthopedics;  Laterality: Right;  LMA  . HERNIA REPAIR  1980   left inguinal hernia  . LAPAROTOMY  07/18/2011   Procedure: EXPLORATORY LAPAROTOMY;  Surgeon: Mariella Saa, MD;  Location: [facility];  Service: General;  Laterality: N/A;  lysis of adhesions entero enterostomy  . LAPAROTOMY  07/28/2011   Procedure: EXPLORATORY LAPAROTOMY;  Surgeon: Mariella Saa, MD;  Location: [facility];  Service: General;  Laterality: N/A;  . Left Knee surgery  1994, 1992  . ORIF PATELLA Right 12/14/2014   Procedure: OPEN REDUCTION INTERNAL (ORIF) FIXATION PATELLA;  Surgeon: Ollen Gross, MD;  Location: [facility];  Service: Orthopedics;  Laterality: Right;  . Surgery for imperforate anus  1958  . TONSILLECTOMY    . UPPER GASTROINTESTINAL ENDOSCOPY  05/15/03    Family History  Problem Relation Age of Onset  . Alzheimer's disease Father   . Diabetes Mother   . Cancer Mother   . Colon cancer Neg Hx   . Prostate cancer Neg Hx     No Known Allergies  No current outpatient medications on file prior to visit.   No current facility-administered medications on file prior to visit.     BP 105/65   Pulse 86   Temp 98.1 F (36.7 C) (Oral)   Resp 16   Ht 5\' 6"  (1.676 m)   Wt 116 lb 12.8 oz (53 kg)   SpO2 100%   BMI 18.85 kg/m      Objective:   Physical Exam  General Appearance- Not in acute  distress.  HEENT Eyes- Scleraeral/Conjuntiva-bilat- Not Yellow. Mouth & Throat- Normal.  Chest and Lung Exam Auscultation: Breath sounds:-Normal. Adventitious sounds:- No Adventitious sounds.  Cardiovascular Auscultation:Rythm - Regular. Heart Sounds -Normal heart sounds.  Abdomen Inspection:-Inspection Normal.  Palpation/Perucssion: Palpation and Percussion of the abdomen reveal- Non Tender, No Rebound tenderness, No rigidity(Guarding) and No Palpable abdominal masses.  Liver:-Normal.  Spleen:- Normal.   Back-no cva tenderness.     Assessment & Plan:  For your history of recent  mild loose stools, I want you to use Metamucil 1 rounded tablespoon in 8 ounces water 3 times daily.  If by chance you have a day where you have more loose stools and you feel that it gets in the way of your work and you could use one Imodium tablet over-the-counter.   This is different pattern than your typical history of chronic constipation with your history prior GI surgeries.  I just want you to keep in mind that we need to make sure that you are not having any consecutive days of constipation in the future followed by loose stools.  In that event we would need to get imaging studies to rule out obstruction.  Presently you mention seeing also some product advertised on TV that you thought might help you.  If you see that advertisement again then please get the name and call or MyChart me that name.  Also referring you to a GI upstairs might be an option.  For chronic neck pain, I am refilling your tramadol.  Glad to hear that your pain in the neck is controlled with tramadol and that is not causing you constipation.  Follow-up in 3 to 4 weeks or as needed.  Esperanza Richters, PA-C

## 2018-02-08 ENCOUNTER — Ambulatory Visit: Payer: Self-pay | Admitting: Medical

## 2018-02-10 ENCOUNTER — Encounter: Payer: Self-pay | Admitting: Medical

## 2018-02-10 ENCOUNTER — Ambulatory Visit (HOSPITAL_BASED_OUTPATIENT_CLINIC_OR_DEPARTMENT_OTHER)
Admission: RE | Admit: 2018-02-10 | Discharge: 2018-02-10 | Disposition: A | Discharge status: Home / Self Care | Payer: Medicare Other | Source: Ambulatory Visit | Attending: Medical | Admitting: Medical

## 2018-02-10 ENCOUNTER — Ambulatory Visit (INDEPENDENT_AMBULATORY_CARE_PROVIDER_SITE_OTHER): Payer: Medicare Other | Admitting: Medical

## 2018-02-10 VITALS — BP 97/77 | HR 77 | Temp 98.0°F | Resp 16 | Ht 66.0 in | Wt 118.0 lb

## 2018-02-10 DIAGNOSIS — Z8719 Personal history of other diseases of the digestive system: Secondary | ICD-10-CM | POA: Insufficient documentation

## 2018-02-10 DIAGNOSIS — K5900 Constipation, unspecified: Secondary | ICD-10-CM | POA: Diagnosis not present

## 2018-02-10 NOTE — Patient Instructions (Signed)
For your history of chronic constipation in the past and history of small bowel obstruction, we will get CMP today and one view abdomen x-ray.  After reviewing x-ray results report might need to order CT of abdomen.  I would let you know later tonight regarding results of x-rays.  If a lot of stool present but no indication of obstruction then might recommend over-the-counter medication to try to clear stool.  Then try to prescribe trulance which  your former GI prescribed when you reported that it seemed to work.  Also will likely go ahead and try to get you a follow-up appointment with Dr. Merri BrunetteFina,  Follow-up date to be determined after lab review.  But will be either call you tonight or tomorrow morning with the results of studies.

## 2018-02-10 NOTE — Progress Notes (Signed)
Subjective:    Patient ID: [name], male    DOB: December 24, 1955, 62 y.o.   MRN: [id number]  HPI  Pt in for evaluation.  He had bowel movement yesterday. He states he had soft bm. He felt bm coming on. This bowel movement was not sudden. He states he felt need to move bowel before going to bathroom. No abdomen bloating. He has one obstruction of bowel years ago which required surgery and then during hospitlation had complication and had to have surgery again. Recently he is expressing concern for softer stools but in past has had chronic constipation. Seen earlier this week. He wants xray today to assess amount of stool present.  Pt has seen Dr. Juanda Chance in the past. Pt has hx of colonic inertia, imperforate anus, surgery/neorectum and obstruction with subsequent surgery in 2013.  Then later pt saw Dr. Merri Brunette. Pt office summary note read  1. Other constipation K59.09 plecanatide (TRULANCE) 3 mg tablet   1. Colonic inertia; chronic constipation: Patient reports overflow fecal incontinence, likely 2/2 chronic constipation. Suspect his colonic inertia is 2/2 years of laxative use, which he is no longer taking. Physical exam showing rectal prolapse and absent external sphincter tone with large volume stool palpable in rectum. Recommended trial of trulance, 3mg  daily, with weekly Fleet's enema. If this does not alleviate symptoms, will try Motegrity and/or addition of senna.   Patient counseled on importance of colonoscopy, as it has been 11 years since his previous. Patient deferred at this time, stating he will reach out when he is ready to have this done.  Pt states in past when used trulance he only used half a tab since it work too well. No trulance use for 2 months.  No severe abdomen pain. Daily bm on softer side. He states too soft. Passing some gas. Always does. No vomiting.  Patient is expressing concern that he might be getting another obstruction.     Review of Systems    Constitutional: Negative for chills, fatigue and fever.  Respiratory: Negative for cough, chest tightness, shortness of breath and wheezing.   Cardiovascular: Negative for chest pain and palpitations.  Gastrointestinal: Negative for abdominal distention, abdominal pain, blood in stool, rectal pain and vomiting.       Looser stool but hx of chronic constipation.  He has concern might be getting obstruction again.  Neurological: Negative for dizziness and light-headedness.  Hematological: Negative for adenopathy. Does not bruise/bleed easily.  Psychiatric/Behavioral: Negative for behavioral problems and confusion.   Past Medical History:  Diagnosis Date  . Anxiety   . Anxiety and depression 03/14/2010  . Colonic inertia    with chronic lifelong costipation  . Congenital fusion of cervical spine   . Depression   . FECAL INCONTINENCE 08/19/2007  . H/O: GI bleed   . Heart murmur    early childhood   . Hydrocephalus --per CT 04/2014 07/23/2014  . Idiopathic scoliosis   . 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 Pneumonia    childhood      Social History   Socioeconomic History  . Marital status: Single    Spouse name: Not on file  . Number of children: 0  . Years of education: Not on file  . Highest education level: Not on file  Occupational History  . 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 Financial resource strain: Not on file  . Food insecurity:    Worry: Not on file  Inability: Not on file  . Transportation needs:    Medical: Not on file    Non-medical: Not on file  Tobacco Use  . Smoking status: Never Smoker  . Smokeless tobacco: Never Used  Substance and Sexual Activity  . Alcohol use: No    Alcohol/week: 0.0 standard drinks  . Drug use: No  . Sexual activity: Yes  Lifestyle  . Physical activity:    Days per week: Not on file    Minutes per session: Not on file  . Stress: Not on file  Relationships  . Social connections:     Talks on phone: Not on file    Gets together: Not on file    Attends religious service: Not on file    Active member of club or organization: Not on file    Attends meetings of clubs or organizations: Not on file    Relationship status: Not on file  . Intimate partner violence:    Fear of current or ex partner: Not on file    Emotionally abused: Not on file    Physically abused: Not on file    Forced sexual activity: Not on file  Other Topics Concern  . Not on file  Social History Narrative   Lives w/ mother , has a younger brother and sister     Past Surgical History:  Procedure Laterality Date  . COLONOSCOPY  11/08/2006  . HARDWARE REMOVAL Right 10/23/2015   Procedure: RIGHT KNEE HARDWARE REMOVAL;  Surgeon: Ollen GrossFrank Aluisio, MD;  Location: [facility];  Service: Orthopedics;  Laterality: Right;  LMA  . HERNIA REPAIR  1980   left inguinal hernia  . LAPAROTOMY  07/18/2011   Procedure: EXPLORATORY LAPAROTOMY;  Surgeon: Mariella SaaBenjamin T Hoxworth, MD;  Location: [facility];  Service: General;  Laterality: N/A;  lysis of adhesions entero enterostomy  . LAPAROTOMY  07/28/2011   Procedure: EXPLORATORY LAPAROTOMY;  Surgeon: Mariella SaaBenjamin T Hoxworth, MD;  Location: [facility];  Service: General;  Laterality: N/A;  . Left Knee surgery  1994, 1992  . ORIF PATELLA Right 12/14/2014   Procedure: OPEN REDUCTION INTERNAL (ORIF) FIXATION PATELLA;  Surgeon: Ollen GrossFrank Aluisio, MD;  Location: [facility];  Service: Orthopedics;  Laterality: Right;  . Surgery for imperforate anus  1958  . TONSILLECTOMY    . UPPER GASTROINTESTINAL ENDOSCOPY  05/15/03    Family History  Problem Relation Age of Onset  . Alzheimer's disease Father   . Diabetes Mother   . Cancer Mother   . Colon cancer Neg Hx   . Prostate cancer Neg Hx     No Known Allergies  Current Outpatient Medications on File Prior to Visit  Medication Sig Dispense Refill  . traMADol (ULTRAM) 50 MG tablet TAKE ONE TABLET BY MOUTH TWICE A DAY AS NEEDED FOR PAIN 60 tablet 0   No  current facility-administered medications on file prior to visit.     BP 97/77   Pulse 77   Temp 98 F (36.7 C) (Oral)   Resp 16   Ht 5\' 6"  (1.676 m)   Wt 118 lb (53.5 kg)   SpO2 100%   BMI 19.05 kg/m       Objective:   Physical Exam  General Mental Status- Alert. General Appearance- Not in acute distress.   Skin General: Color- Normal Color. Moisture- Normal Moisture.  Neck Carotid Arteries- Normal color. Moisture- Normal Moisture. No carotid bruits. No JVD.  Chest and Lung Exam Auscultation: Breath Sounds:-Normal.  Cardiovascular Auscultation:Rythm- Regular. Murmurs & Other  Heart Sounds:Auscultation of the heart reveals- No Murmurs.  Abdomen Inspection:-Inspeection Normal. Palpation/Percussion:Note:No mass. Palpation and Percussion of the abdomen reveal- Non Tender, Non Distended + BS, no rebound or guarding.    Neurologic Cranial Nerve exam:- CN III-XII intact(No nystagmus), symmetric smile. Strength:- 5/5 equal and symmetric strength both upper and lower extremities.      Assessment & Plan:  For your history of chronic constipation in the past and history of small bowel obstruction, we will get CMP today and one view abdomen x-ray.  After reviewing x-ray results report might need to order CT of abdomen.  I would let you know later tonight regarding results of x-rays.  If a lot of stool present but no indication of obstruction then might recommend over-the-counter medication to try to clear stool.  Then try to prescribe trulance which  your former GI prescribed when you reported that it seemed to work.  Also will likely go ahead and try to get you a follow-up appointment with Dr. Merri Brunette,  Follow-up date to be determined after lab review.  But will be either call you tonight or tomorrow morning with the results of studies.

## 2018-02-11 LAB — COMPREHENSIVE METABOLIC PANEL
ALBUMIN: 4.4 g/dL (ref 3.5–5.2)
ALT: 8 U/L (ref 0–53)
AST: 16 U/L (ref 0–37)
Alkaline Phosphatase: 48 U/L (ref 39–117)
BUN: 11 mg/dL (ref 6–23)
CHLORIDE: 103 meq/L (ref 96–112)
CO2: 30 mEq/L (ref 19–32)
Calcium: 9.2 mg/dL (ref 8.4–10.5)
Creatinine, Ser: 1.03 mg/dL (ref 0.40–1.50)
GFR: 78 mL/min (ref >60.00)
Glucose, Bld: 83 mg/dL (ref 70–99)
POTASSIUM: 3.9 meq/L (ref 3.5–5.1)
SODIUM: 140 meq/L (ref 135–145)
Total Bilirubin: 0.3 mg/dL (ref 0.2–1.2)
Total Protein: 6.5 g/dL (ref 6.0–8.3)

## 2018-03-01 ENCOUNTER — Telehealth: Payer: Self-pay | Admitting: Medical

## 2018-03-01 ENCOUNTER — Ambulatory Visit: Payer: Self-pay | Admitting: Medical

## 2018-03-01 DIAGNOSIS — Z0289 Encounter for other administrative examinations: Secondary | ICD-10-CM

## 2018-03-01 NOTE — Telephone Encounter (Signed)
Copied from CRM [id number]. Topic: Quick Communication - See Telephone Encounter >> Mar 01, 2018 11:15 AM Luanna Cole wrote: CRM for notification. See Telephone encounter for: 03/01/18. Pt called and stated that he is starting to feel very tired. Pt would like a call back regarding getting something for energy. Cb#[phone]

## 2018-03-02 NOTE — Telephone Encounter (Signed)
Busy with patients in office. Can't call pt. But will you let him know that he needs appointment to discuss fatigue and will likely need panel of lab to work up cause.

## 2018-03-03 NOTE — Telephone Encounter (Signed)
Please call and schedule appointment.

## 2018-03-04 ENCOUNTER — Telehealth: Payer: Self-pay

## 2018-03-04 NOTE — Telephone Encounter (Signed)
Copied from CRM [id number]. Topic: General - Other >> Mar 04, 2018  8:23 AM Ronney Lion A wrote: Reason for CRM: Patient called in wanting to have medication Trulance called in to the pharmacy. He says his PCP is aware that he is taking this, and he would like another refill.   Preferred pharmacy is on File.   Please advise

## 2018-03-06 ENCOUNTER — Telehealth: Payer: Self-pay | Admitting: Medical

## 2018-03-06 MED ORDER — PLECANATIDE 3 MG PO TABS: 3.0000 mg | ORAL_TABLET | Freq: Every day | ORAL | 3 refills | Status: DC

## 2018-03-06 NOTE — Telephone Encounter (Signed)
Rx trulance sent to pt pharmacy.

## 2018-03-06 NOTE — Telephone Encounter (Signed)
Rx trulance sent to pt pharmacy. 

## 2018-03-06 NOTE — Telephone Encounter (Signed)
Opened to review and send in prescription.  

## 2018-03-08 ENCOUNTER — Encounter: Payer: Self-pay | Admitting: Medical

## 2018-03-08 NOTE — Telephone Encounter (Signed)
Called pt and pt stated is feeling better but that if it continues he will call to make an appt with provider.

## 2018-03-09 ENCOUNTER — Other Ambulatory Visit: Payer: Self-pay | Admitting: Medical

## 2018-03-09 DIAGNOSIS — M542 Cervicalgia: Secondary | ICD-10-CM

## 2018-03-09 NOTE — Telephone Encounter (Unsigned)
Copied from CRM [id number]. Topic: Quick Communication - Rx Refill/Question >> Mar 09, 2018 12:32 PM Marylen Ponto wrote: Medication: traMADol (ULTRAM) 50 MG tablet   Has the patient contacted their pharmacy? no  Preferred Pharmacy (with phone number or street name): [facility] [address], [place] - [address] [phone] (Phone) [phone] (Fax)  Agent: Please be advised that RX refills may take up to 3 business days. We ask that you follow-up with your pharmacy.

## 2018-03-09 NOTE — Telephone Encounter (Signed)
Requested medication (s) are due for refill today: yes  Requested medication (s) are on the active medication list: yes    Last refill: 02/07/18  Future visit scheduled yes  Notes to clinic:not delegated  Requested Prescriptions  Pending Prescriptions Disp Refills   traMADol (ULTRAM) 50 MG tablet 60 tablet 0    Sig: TAKE ONE TABLET BY MOUTH TWICE A DAY AS NEEDED FOR PAIN     Not Delegated - Analgesics:  Opioid Agonists Failed - 03/09/2018 12:42 PM      Failed - This refill cannot be delegated      Failed - Urine Drug Screen completed in last 360 days.      Passed - Valid encounter within last 6 months    Recent Outpatient Visits          3 weeks ago History of constipation   Holiday representative at [facility], [place], [place]   1 month ago Neck pain   Holiday representative at [facility], [place], [place]   3 months ago Neck pain   Holiday representative at [facility], [place], [place]   7 months ago Chronic left shoulder pain   Holiday representative at [facility], [place], [place]   7 months ago Neck pain   Holiday representative at [facility], [place], [employer]            In 7 months Moshe Cipro, Philis Kendall, Gaffer at [employer], [place]

## 2018-03-10 MED ORDER — TRAMADOL HCL 50 MG PO TABS: ORAL_TABLET | ORAL | 0 refills | Status: DC

## 2018-03-10 NOTE — Telephone Encounter (Signed)
Rx tramadol sent to pt pharmacy. 

## 2018-04-01 ENCOUNTER — Other Ambulatory Visit: Payer: Self-pay | Admitting: Medical

## 2018-04-01 DIAGNOSIS — M542 Cervicalgia: Secondary | ICD-10-CM

## 2018-04-01 NOTE — Telephone Encounter (Signed)
Requested medication (s) are due for refill today: no due in 1 week  Requested medication (s) are on the active medication list: yes  Last refill:  03/10/18  Future visit scheduled: no  Notes to clinic:  Controlled substance   Requested Prescriptions  Pending Prescriptions Disp Refills   traMADol (ULTRAM) 50 MG tablet 60 tablet 0    Sig: TAKE ONE TABLET BY MOUTH TWICE A DAY AS NEEDED FOR PAIN     Not Delegated - Analgesics:  Opioid Agonists Failed - 04/01/2018  9:29 AM      Failed - This refill cannot be delegated      Failed - Urine Drug Screen completed in last 360 days.      Passed - Valid encounter within last 6 months    Recent Outpatient Visits          1 month ago History of constipation   Holiday representative at [facility] [employer], [place], [place]   1 month ago Neck pain   Holiday representative at [employer], [place], [place]   4 months ago Neck pain   Holiday representative at [employer], [place], [place]   7 months ago Chronic left shoulder pain   Holiday representative at [employer], [place], [place]   8 months ago Neck pain   Holiday representative at [employer], [place], [employer]            In 7 months Moshe Cipro, Philis Kendall, Gaffer at [employer], [place]

## 2018-04-01 NOTE — Telephone Encounter (Signed)
Copied from CRM [id number]. Topic: Quick Communication - Rx Refill/Question >> Apr 01, 2018  9:24 AM Tamela Oddi wrote: Medication: traMADol (ULTRAM) 50 MG tablet  Patient called to request a refill for the above medication  Preferred Pharmacy (with phone number or street name): [facility] [address] [place] - [address] [phone] (Phone) [phone] (Fax)

## 2018-04-03 NOTE — Telephone Encounter (Signed)
Pt asking for refill of tramadol. Not sure why as it appears I prescribed tramadol on oct 17,2019. Would you notify he is one week early getting refill. Can refill when due.

## 2018-04-07 ENCOUNTER — Ambulatory Visit (INDEPENDENT_AMBULATORY_CARE_PROVIDER_SITE_OTHER): Payer: Medicare Other | Admitting: Medical

## 2018-04-07 ENCOUNTER — Encounter: Payer: Self-pay | Admitting: Medical

## 2018-04-07 VITALS — BP 125/83 | HR 86 | Temp 97.6°F | Resp 16 | Ht 66.0 in | Wt 124.8 lb

## 2018-04-07 DIAGNOSIS — Z8719 Personal history of other diseases of the digestive system: Secondary | ICD-10-CM

## 2018-04-07 DIAGNOSIS — M542 Cervicalgia: Secondary | ICD-10-CM

## 2018-04-07 MED ORDER — TRAMADOL HCL 50 MG PO TABS: ORAL_TABLET | ORAL | 0 refills | Status: DC

## 2018-04-07 NOTE — Patient Instructions (Addendum)
You do have chronic neck pain which has been controlled by tramadol twice daily.  In the past, this was adequate.  However now you need more frequent dosing.  So I am increasing the dosing to 1 tablet 3 times daily.   For your history of chronic intermittent constipation, I want you to follow conservative guidelines as discussed in the past and also use your Trulance if needed.  If you do get constipated and ask you cut back on the tramadol to just once or twice daily.  For severe constipation might have to hold tramadol completely.  If you get constipation for more than 3 days or severe pain associated constipation then recommend ED evaluation.  Follow-up in 2 months or as needed.  Asked medical assistant to update the patient controlled medication contract to 3 times daily dosing.

## 2018-04-07 NOTE — Progress Notes (Signed)
Subjective:    Patient ID: [name], male    DOB: 02/17/1956, 62 y.o.   MRN: [id number]  HPI  Pt in for neck pain and shoulder pain. See prior xray in the past. Pt has done well with tramadol up until recently. However just recently bid not adequate Limited  rx in past due to constipation hx. Pt states GI symptoms are improved. He does have trulance. Has seen GI MD in past due to his constipation history.    FINDINGS: Diffuse severe multilevel degenerative change and osteopenia. 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 The cervical vertebrae are numbered accordingly. Mild approximately 3 mm retrolisthesis of C4 on C5 noted. This is most likely degenerative. No evidence of fracture or dislocation. Biapical pleural thickening noted consistent with scarring .  IMPRESSION: 1.  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  2. 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 The cervical vertebrae are numbered accordingly. Mild retrolisthesis C4 on C5 of approximately 3 mm noted. Severe multilevel degenerative change. No evidence of fracture or dislocation.   Review of Systems  Constitutional: Negative for chills, fatigue and fever.  Respiratory: Negative for cough, chest tightness and stridor.   Cardiovascular: Negative for chest pain and palpitations.  Gastrointestinal: Negative for abdominal pain.  Musculoskeletal: Positive for neck pain. Negative for back pain, joint swelling and neck stiffness.  Skin: Negative for rash.  Neurological: Negative for dizziness, seizures, weakness and headaches.  Hematological: Negative for adenopathy. Does not bruise/bleed easily.  Psychiatric/Behavioral: Negative for behavioral problems, decreased concentration and sleep disturbance. The patient is not nervous/anxious.    Past Medical History:  Diagnosis Date  . Anxiety   . Anxiety and depression 03/14/2010  . Colonic inertia    with chronic lifelong costipation  . Congenital fusion of cervical spine   .  Depression   . FECAL INCONTINENCE 08/19/2007  . H/O: GI bleed   . Heart murmur    early childhood   . Hydrocephalus --per CT 04/2014 07/23/2014  . Idiopathic scoliosis   . 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 Pneumonia    childhood      Social History   Socioeconomic History  . Marital status: Single    Spouse name: Not on file  . Number of children: 0  . Years of education: Not on file  . Highest education level: Not on file  Occupational History  . 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 Financial resource strain: Not on file  . Food insecurity:    Worry: Not on file    Inability: Not on file  . Transportation needs:    Medical: Not on file    Non-medical: Not on file  Tobacco Use  . Smoking status: Never Smoker  . Smokeless tobacco: Never Used  Substance and Sexual Activity  . Alcohol use: No    Alcohol/week: 0.0 standard drinks  . Drug use: No  . Sexual activity: Yes  Lifestyle  . Physical activity:    Days per week: Not on file    Minutes per session: Not on file  . Stress: Not on file  Relationships  . Social connections:    Talks on phone: Not on file    Gets together: Not on file    Attends religious service: Not on file    Active member of club or organization: Not on file    Attends meetings of clubs or organizations: Not on file    Relationship status: Not on file  .  Intimate partner violence:    Fear of current or ex partner: Not on file    Emotionally abused: Not on file    Physically abused: Not on file    Forced sexual activity: Not on file  Other Topics Concern  . Not on file  Social History Narrative   Lives w/ mother , has a younger brother and sister     Past Surgical History:  Procedure Laterality Date  . COLONOSCOPY  11/08/2006  . HARDWARE REMOVAL Right 10/23/2015   Procedure: RIGHT KNEE HARDWARE REMOVAL;  Surgeon: Ollen Gross, MD;  Location: [facility];  Service: Orthopedics;  Laterality: Right;  LMA  .  HERNIA REPAIR  1980   left inguinal hernia  . LAPAROTOMY  07/18/2011   Procedure: EXPLORATORY LAPAROTOMY;  Surgeon: Mariella Saa, MD;  Location: [facility];  Service: General;  Laterality: N/A;  lysis of adhesions entero enterostomy  . LAPAROTOMY  07/28/2011   Procedure: EXPLORATORY LAPAROTOMY;  Surgeon: Mariella Saa, MD;  Location: [facility];  Service: General;  Laterality: N/A;  . Left Knee surgery  1994, 1992  . ORIF PATELLA Right 12/14/2014   Procedure: OPEN REDUCTION INTERNAL (ORIF) FIXATION PATELLA;  Surgeon: Ollen Gross, MD;  Location: [facility];  Service: Orthopedics;  Laterality: Right;  . Surgery for imperforate anus  1958  . TONSILLECTOMY    . UPPER GASTROINTESTINAL ENDOSCOPY  05/15/03    Family History  Problem Relation Age of Onset  . Alzheimer's disease Father   . Diabetes Mother   . Cancer Mother   . Colon cancer Neg Hx   . Prostate cancer Neg Hx     No Known Allergies  Current Outpatient Medications on File Prior to Visit  Medication Sig Dispense Refill  . Plecanatide (TRULANCE) 3 MG TABS Take 3 mg by mouth daily. 30 tablet 3  . traMADol (ULTRAM) 50 MG tablet TAKE ONE TABLET BY MOUTH TWICE A DAY AS NEEDED FOR PAIN 60 tablet 0   No current facility-administered medications on file prior to visit.     BP 125/83   Pulse 86   Temp 97.6 F (36.4 C) (Oral)   Resp 16   Ht 5\' 6"  (1.676 m)   Wt 124 lb 12.8 oz (56.6 kg)   SpO2 100%   BMI 20.14 kg/m       Objective:   Physical Exam  General- No acute distress. Pleasant patient. Neck- Full range of motion, no jvd, mild  tenderness to palpation mid aspect. Lungs- Clear, even and unlabored. Heart- regular rate and rhythm. Neurologic- CNII- XII grossly intact.        Assessment & Plan:  You do have chronic neck pain which has been controlled by tramadol twice daily.  In the past, this was adequate.  However now you need more frequent dosing.  So I am increasing the dosing to 1 tablet 3 times  daily.   For your history of chronic intermittent constipation, I want you to follow conservative guidelines as discussed in the past and also use your Trulance if needed.  If you do get constipated and ask you cut back on the tramadol to just once or twice daily.  For severe constipation might have to hold tramadol completely.  If you get constipation for more than 3 days or severe pain associated constipation then recommend ED evaluation.  Follow-up in 2 months or as needed.  Asked medical assistant to update the patient controlled medication contract to 3 times daily dosing  Ramon Dredge  Raylen Tangonan, PA-C

## 2018-04-19 ENCOUNTER — Telehealth: Payer: Self-pay | Admitting: Medical

## 2018-04-19 ENCOUNTER — Telehealth: Payer: Self-pay

## 2018-04-19 DIAGNOSIS — K5900 Constipation, unspecified: Secondary | ICD-10-CM

## 2018-04-19 DIAGNOSIS — R140 Abdominal distension (gaseous): Secondary | ICD-10-CM | POA: Diagnosis not present

## 2018-04-19 DIAGNOSIS — R194 Change in bowel habit: Secondary | ICD-10-CM | POA: Diagnosis not present

## 2018-04-19 NOTE — Telephone Encounter (Signed)
Recommend he keep that appointment in January. I can try to initiate new referral but no guarantee that will get him in sooner. So keep current until we get definite quicker referral. Continue trulace if needed. If severe gi symptoms over thanksgiving then ED evaluation.

## 2018-04-19 NOTE — Telephone Encounter (Signed)
Routed to ChinchillaEdward, PCP, to advise on best course of action.   Copied from CRM [phone]. Topic: Referral - Status >> Apr 19, 2018  9:15 AM Baldo DaubAlexander, Amber L wrote: Reason for CRM:   Pt states that he had a GI appt with Dr. Merri BrunetteFina yesterday that he was late to so he was unable to be seen, they rescheduled him but it isn't until January.  Pt wants to know if there is another GI he can be referred to because he still isn't well.  States that the [facility] to [facility]. Pt can be reached at [phone]

## 2018-04-19 NOTE — Telephone Encounter (Signed)
Referral to new gi placed. 

## 2018-04-20 NOTE — Telephone Encounter (Signed)
Author attempted to contact pt. At number provided, but no answer, unable to leave VM due to full box. OK for PEC to relay message if pt. Returns call. Routed to [place] to follow-up upon her return to the office.

## 2018-05-11 ENCOUNTER — Telehealth: Payer: Self-pay | Admitting: Medical

## 2018-05-11 DIAGNOSIS — M542 Cervicalgia: Secondary | ICD-10-CM

## 2018-05-11 NOTE — Telephone Encounter (Signed)
Copied from CRM [phone]. Topic: Quick Communication - Rx Refill/Question >> May 11, 2018 11:34 AM Burchel, Abbi R wrote: Medication: traMADol Janean Sark(ULTRAM) 50 MG tablet    [facility] [address] [facility] [place], [place] [address] [place] [address] Phone: [phone] Fax: [phone]  Pt was  advised that RX refills may take up to 3 business days. We ask that you follow-up with your pharmacy.

## 2018-05-12 MED ORDER — TRAMADOL HCL 50 MG PO TABS: ORAL_TABLET | ORAL | 0 refills | Status: DC

## 2018-05-12 NOTE — Telephone Encounter (Signed)
Refilled tramadol. Up to date on uds and contract.

## 2018-05-17 ENCOUNTER — Telehealth: Payer: Self-pay | Admitting: Medical

## 2018-05-17 DIAGNOSIS — M542 Cervicalgia: Secondary | ICD-10-CM

## 2018-05-17 MED ORDER — TRAMADOL HCL 50 MG PO TABS: ORAL_TABLET | ORAL | 0 refills | Status: DC

## 2018-05-17 NOTE — Telephone Encounter (Signed)
Rx tramadol sent to pharmacy. Last rx I accidentally did not put sig.

## 2018-05-17 NOTE — Telephone Encounter (Signed)
Spoke with Pharmacy and they stated that they need to know the dosage and how often pt is to take Tramadol. It is not listed on the Rx. Pt has not been able to pick up rx since it was sent in on 05/11/18. Please advise.

## 2018-05-19 ENCOUNTER — Telehealth: Payer: Self-pay

## 2018-05-19 NOTE — Telephone Encounter (Signed)
Ok, But not sure why they only gave him 6 tabs iubuprofen? Not on med list? Does he want more tabs? Who wrote him 6 tabs?

## 2018-05-19 NOTE — Telephone Encounter (Signed)
FYI [facility] call center faxed message to relay that pt was prescribed motrin 800mg  q 8 hrs, #6, 0RF on 05/14/18.

## 2018-06-07 ENCOUNTER — Encounter: Payer: Self-pay | Admitting: Medical

## 2018-06-15 ENCOUNTER — Other Ambulatory Visit: Payer: Self-pay | Admitting: Medical

## 2018-06-15 DIAGNOSIS — M542 Cervicalgia: Secondary | ICD-10-CM

## 2018-06-15 NOTE — Telephone Encounter (Signed)
Copied from CRM [id number]. Topic: Quick Communication - Rx Refill/Question >> Jun 15, 2018  1:57 PM Percival Spanish wrote: Medication   traMADol Janean Sark) 50 MG tablet  Preferred Pharmacy  [facility]   Agent: Please be advised that RX refills may take up to 3 business days. We ask that you follow-up with your pharmacy.

## 2018-06-15 NOTE — Telephone Encounter (Signed)
Looks like pt controlled substance contract last done a year ago . He is due for new contract. Would want him to give uds on same day. If you can get him scheduled for tomorrow or Friday. If both done then will send in new script of tramadol. Took out script presently until these are done.

## 2018-06-16 NOTE — Telephone Encounter (Signed)
Pt CSC was done 04/07/18 not due until 04/08/19. UDS was done 11/29/17. Please advise

## 2018-06-17 ENCOUNTER — Telehealth: Payer: Self-pay | Admitting: Medical

## 2018-06-17 DIAGNOSIS — M542 Cervicalgia: Secondary | ICD-10-CM

## 2018-06-17 MED ORDER — TRAMADOL HCL 50 MG PO TABS: ORAL_TABLET | ORAL | 0 refills | Status: DC

## 2018-06-17 NOTE — Telephone Encounter (Signed)
Rx tramadol sent to pt pharmacy. 

## 2018-06-20 ENCOUNTER — Ambulatory Visit: Payer: Self-pay | Admitting: Medical

## 2018-06-21 ENCOUNTER — Ambulatory Visit (INDEPENDENT_AMBULATORY_CARE_PROVIDER_SITE_OTHER): Payer: Medicare Other | Admitting: Medical

## 2018-06-21 ENCOUNTER — Encounter: Payer: Self-pay | Admitting: Medical

## 2018-06-21 VITALS — BP 108/76 | HR 72 | Temp 98.0°F | Resp 16 | Ht 66.0 in | Wt 120.4 lb

## 2018-06-21 DIAGNOSIS — H6121 Impacted cerumen, right ear: Secondary | ICD-10-CM

## 2018-06-21 DIAGNOSIS — R6884 Jaw pain: Secondary | ICD-10-CM | POA: Diagnosis not present

## 2018-06-21 DIAGNOSIS — M272 Inflammatory conditions of jaws: Secondary | ICD-10-CM

## 2018-06-21 MED ORDER — AMOXICILLIN-POT CLAVULANATE 875-125 MG PO TABS: 1.0000 | ORAL_TABLET | Freq: Two times a day (BID) | ORAL | 0 refills | Status: DC

## 2018-06-21 NOTE — Progress Notes (Signed)
Subjective:    Patient ID: [name], male    DOB: 03/14/1956, 63 y.o.   MRN: [id number]  HPI  Pt in for 3-4 days of rt ear pain/mild. No obvious nasal congestion.Pt thinks notes rt lower jaw is hurting a little bit. He thinks some teeth pain.  Pt states no problems hearing.  He points pain either present in rt tmj area or over jaw. Some pain when he eats.    Review of Systems  Constitutional: Negative for chills, fatigue and fever.  HENT:       Ear area discomfort.   Rt jaw area pain.  Respiratory: Negative for cough, chest tightness, shortness of breath and wheezing.   Cardiovascular: Negative for chest pain and palpitations.  Gastrointestinal: Negative for abdominal pain.  Musculoskeletal: Negative for back pain and joint swelling.  Skin: Negative for rash.  Hematological: Negative for adenopathy. Does not bruise/bleed easily.  Psychiatric/Behavioral: Negative for behavioral problems, confusion and sleep disturbance. The patient is not nervous/anxious.     Past Medical History:  Diagnosis Date  . Anxiety   . Anxiety and depression 03/14/2010  . Colonic inertia    with chronic lifelong costipation  . Congenital fusion of cervical spine   . Depression   . FECAL INCONTINENCE 08/19/2007  . H/O: GI bleed   . Heart murmur    early childhood   . Hydrocephalus --per CT 04/2014 07/23/2014  . Idiopathic scoliosis   . 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 Pneumonia    childhood      Social History   Socioeconomic History  . Marital status: Single    Spouse name: Not on file  . Number of children: 0  . Years of education: Not on file  . Highest education level: Not on file  Occupational History  . 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 Financial resource strain: Not on file  . Food insecurity:    Worry: Not on file    Inability: Not on file  . Transportation needs:    Medical: Not on file    Non-medical: Not on file  Tobacco  Use  . Smoking status: Never Smoker  . Smokeless tobacco: Never Used  Substance and Sexual Activity  . Alcohol use: No    Alcohol/week: 0.0 standard drinks  . Drug use: No  . Sexual activity: Yes  Lifestyle  . Physical activity:    Days per week: Not on file    Minutes per session: Not on file  . Stress: Not on file  Relationships  . Social connections:    Talks on phone: Not on file    Gets together: Not on file    Attends religious service: Not on file    Active member of club or organization: Not on file    Attends meetings of clubs or organizations: Not on file    Relationship status: Not on file  . Intimate partner violence:    Fear of current or ex partner: Not on file    Emotionally abused: Not on file    Physically abused: Not on file    Forced sexual activity: Not on file  Other Topics Concern  . Not on file  Social History Narrative   Lives w/ mother , has a younger brother and sister     Past Surgical History:  Procedure Laterality Date  . COLONOSCOPY  11/08/2006  . HARDWARE REMOVAL Right 10/23/2015   Procedure: RIGHT KNEE HARDWARE  REMOVAL;  Surgeon: Ollen Gross, MD;  Location: [facility];  Service: Orthopedics;  Laterality: Right;  LMA  . HERNIA REPAIR  1980   left inguinal hernia  . LAPAROTOMY  07/18/2011   Procedure: EXPLORATORY LAPAROTOMY;  Surgeon: Mariella Saa, MD;  Location: [facility];  Service: General;  Laterality: N/A;  lysis of adhesions entero enterostomy  . LAPAROTOMY  07/28/2011   Procedure: EXPLORATORY LAPAROTOMY;  Surgeon: Mariella Saa, MD;  Location: [facility];  Service: General;  Laterality: N/A;  . Left Knee surgery  1994, 1992  . ORIF PATELLA Right 12/14/2014   Procedure: OPEN REDUCTION INTERNAL (ORIF) FIXATION PATELLA;  Surgeon: Ollen Gross, MD;  Location: [facility];  Service: Orthopedics;  Laterality: Right;  . Surgery for imperforate anus  1958  . TONSILLECTOMY    . UPPER GASTROINTESTINAL ENDOSCOPY  05/15/03    Family History    Problem Relation Age of Onset  . Alzheimer's disease Father   . Diabetes Mother   . Cancer Mother   . Colon cancer Neg Hx   . Prostate cancer Neg Hx     No Known Allergies  Current Outpatient Medications on File Prior to Visit  Medication Sig Dispense Refill  . traMADol (ULTRAM) 50 MG tablet 1 tab po tid prn pain 90 tablet 0  . Plecanatide (TRULANCE) 3 MG TABS Take 3 mg by mouth daily. (Patient not taking: Reported on 06/21/2018) 30 tablet 3   No current facility-administered medications on file prior to visit.     BP 108/76   Pulse 72   Temp 98 F (36.7 C) (Oral)   Resp 16   Ht 5\' 6"  (1.676 m)   Wt 120 lb 6.4 oz (54.6 kg)   SpO2 99%   PF (!) 10876 L/min   BMI 19.43 kg/m       Objective:   Physical Exam  General  Mental Status - Alert. General Appearance - Well groomed. Not in acute distress.  Skin Rashes- No Rashes.  HEENT Head- Normal. Ear Auditory Canal - Left- Normal. Right - Wax blocking view of rt tmTympanic Membrane- Left- Normal. Right- No tm seen due to wax. Eye Sclera/Conjunctiva- Left- Normal. Right- Normal. Nose & Sinuses Nasal Mucosa- Left-  Boggy and Congested. Right-  Boggy and  Congested.Bilateral maxillary and frontal sinus pressure. Mouth & Throat Lips: Upper Lip- Normal: no dryness, cracking, pallor, cyanosis, or vesicular eruption. Lower Lip-Normal: no dryness, cracking, pallor, cyanosis or vesicular eruption. Buccal Mucosa- Bilateral- No Aphthous ulcers. Oropharynx- No Discharge or Erythema. Tonsils: Characteristics- Bilateral- No Erythema or Congestion. Size/Enlargement- Bilateral- No enlargement. Discharge- bilateral-None.  Rt lower teeth- missing teeth and poor dentition with decayed teeth. Rt jaw line faint tender to palpation but not swollen.  Neck Neck- Supple. No Masses.   Chest and Lung Exam Auscultation: Breath Sounds:-Clear even and unlabored.  Cardiovascular Auscultation:Rythm- Regular, rate and rhythm. Murmurs &  Other Heart Sounds:Ausculatation of the heart reveal- No Murmurs.  Lymphatic Head & Neck General Head & Neck Lymphatics: Bilateral: Description- No Localized lymphadenopathy.       Assessment & Plan:  You do have recent right jaw/tooth area pain.  On exam your teeth appear to have dark caries present.  Considering a possible tooth infection.  I am going to prescribe you Augmentin antibiotic.  For tooth pain you could use ibuprofen 200 to 400 mg every 8 hours. Would recommend that you get opinion from your dentist regarding these teeth.  Let him know if the antibiotic relieved the  pain.  You do also have severe right-sided cerumen impaction.  Medical assistant did attempt to irrigate but states no wax came out.  Would recommend using Debrox over-the-counter about 3 times daily.  Then schedule appointment for this coming Friday at 140 to washout the wax.  Esperanza RichtersEdward Nijel Flink, PA-C

## 2018-06-21 NOTE — Patient Instructions (Signed)
You do have recent right jaw/tooth area pain.  On exam your teeth appear to have dark caries present.  Considering a possible tooth infection.  I am going to prescribe you Augmentin antibiotic.  For tooth pain you could use ibuprofen 200 to 400 mg every 8 hours. Would recommend that you get opinion from your dentist regarding these teeth.  Let him know if the antibiotic relieved the pain.  You do also have severe right-sided cerumen impaction.  Medical assistant did attempt to irrigate but states no wax came out.  Would recommend using Debrox over-the-counter about 3 times daily.  Then schedule appointment for this coming Friday at 140 to washout the wax.

## 2018-06-21 NOTE — Telephone Encounter (Signed)
He is up to date with contract. So can do later.

## 2018-06-24 ENCOUNTER — Ambulatory Visit (INDEPENDENT_AMBULATORY_CARE_PROVIDER_SITE_OTHER): Payer: Medicare Other | Admitting: Medical

## 2018-06-24 ENCOUNTER — Encounter: Payer: Self-pay | Admitting: Medical

## 2018-06-24 VITALS — BP 109/59 | HR 78 | Temp 98.5°F | Resp 16 | Ht 66.0 in | Wt 121.8 lb

## 2018-06-24 DIAGNOSIS — H6121 Impacted cerumen, right ear: Secondary | ICD-10-CM | POA: Diagnosis not present

## 2018-06-24 DIAGNOSIS — R6884 Jaw pain: Secondary | ICD-10-CM | POA: Diagnosis not present

## 2018-06-24 DIAGNOSIS — M272 Inflammatory conditions of jaws: Secondary | ICD-10-CM

## 2018-06-24 NOTE — Patient Instructions (Addendum)
Your right jaw region pain and ear region discomfort cleared up with Augmentin.  No pain presently.  Would recommend that you continue with the antibiotic and go ahead and notify dentist that symptoms cleared with antibiotic.  He may do x-rays of jaw area determine potential tooth source of pain.  And he might recommend extraction of tooth.  You still have wax in the right ear canal.  You declined washing wax out  today.  If you start to get blocked sensation then would recommend Debrox for 3 days and then scheduled for ear lavage/washing out of wax.  Follow-up already scheduled.

## 2018-06-24 NOTE — Progress Notes (Signed)
   Subjective:    Patient ID: [name], male    DOB: [birth year], 62 y.o.   MRN: [id number]  HPI  Patient seen for follow-up.  He states his right lower jaw area and ear pain got better quickly with the antibiotic.  Inside of his ear feels fine as well.  I did advise him he could use Debrox over-the-counter to soften up wax so we could irrigate out the wax today.  However he states that he feels fine today and does not want the wax removed.  He does not report any problems hearing.  Review of Systems  Constitutional: Negative for chills, fatigue and fever.  HENT:       Much better see HPI.  Respiratory: Negative for cough, chest tightness, shortness of breath and wheezing.   Cardiovascular: Negative for chest pain and palpitations.  Musculoskeletal: Negative for neck pain.  Neurological: Negative for dizziness and headaches.  Hematological: Negative for adenopathy. Does not bruise/bleed easily.       Objective:   Physical Exam   General-pleasant no acute distress.  HEENT- maxillary and frontal sinus tenderness nontender to palpation.  Right mandible area is no longer tender to palpation today.  It is not swollen.  Patient still has obviously decayed teeth on inspection of his mouth.  Worse on the right lower side of his mouth. Right ear canal still has wax present.  Cannot see tympanic membrane.  Lungs- clear even unlabored Heart-regular rate rhythm .        Assessment & Plan:  Your right jaw region pain and ear region discomfort cleared up with Augmentin.  No pain presently.  Would recommend that you continue with the antibiotic and go ahead and notify dentist that symptoms cleared with antibiotic.  He may do x-rays of jaw area determine potential tooth source of pain.  And he might recommend extraction of tooth.  You still have wax in the right ear canal.  You declined washing wax out  today.  If you start to get blocked sensation then would recommend Debrox for 3 days  and then scheduled for ear lavage/washing out of wax.  Follow-up already scheduled.  Esperanza Richters, PA-C

## 2018-06-27 ENCOUNTER — Telehealth: Payer: Self-pay

## 2018-06-27 NOTE — Telephone Encounter (Signed)
Pklease call pt and explain busy in office. I will try to call him Tuesday afternoon but can't guarantee. But will try. Calling patients very difficult to get to early. Example addressing this at near 10 pm. If call pt usually prioritize dangerous lab results that need to be addressed. But think late tomorrow afternoon possible. Please notify him and send me back reminder note/message.

## 2018-06-27 NOTE — Telephone Encounter (Signed)
Copied from CRM [id number]. Topic: General - Other >> Jun 27, 2018  2:18 PM Gaynelle Adu wrote: Reason for CRM:  patient is calling to request to speak with his doctor in regards to his stomach concerns. He stated he would jut like to discuss with him personally. Please advise

## 2018-06-28 NOTE — Telephone Encounter (Signed)
Spoke with pt. Pt states he want to take with PCP about stomach issues. Pt states okay for PCP to call whenever her gets a chance.

## 2018-06-29 ENCOUNTER — Telehealth: Payer: Self-pay | Admitting: Medical

## 2018-06-29 NOTE — Telephone Encounter (Signed)
Called pt after hours. Pt has some chronic constipation in past. I advised continue trulance if needed. But to also follow up with GI. He admitted he missed colonsocopy. He agreed he would call his gi office and schedule that. I advised him to get gi opinion on other options other than trulance.

## 2018-07-15 ENCOUNTER — Other Ambulatory Visit: Payer: Self-pay | Admitting: Medical

## 2018-07-15 DIAGNOSIS — M542 Cervicalgia: Secondary | ICD-10-CM

## 2018-07-15 NOTE — Telephone Encounter (Signed)
Copied from CRM [id number]. Topic: Quick Communication - Rx Refill/Question >> Jul 15, 2018  9:59 AM [name] wrote: Medication:traMADol (ULTRAM) 50 MG tablet [[id number]]   Has the patient contacted their pharmacy?no Preferred Pharmacy (with phone number or street name):[facility] [address], [place] - [address] [phone] (Phone) [phone] (Fax)   Agent: Please be advised that RX refills may take up to 3 business days. We ask that you follow-up with your pharmacy.

## 2018-07-15 NOTE — Telephone Encounter (Signed)
Requested medication (s) are due for refill today: yes  Requested medication (s) are on the active medication list: yes  Last refill: 06/15/2018  Future visit scheduled: no  Notes to clinic:  Not delegated     Requested Prescriptions  Pending Prescriptions Disp Refills   traMADol (ULTRAM) 50 MG tablet 90 tablet 0    Sig: 1 tab po tid prn pain     Not Delegated - Analgesics:  Opioid Agonists Failed - 07/15/2018 10:12 AM      Failed - This refill cannot be delegated      Passed - Urine Drug Screen completed in last 360 days.      Passed - Valid encounter within last 6 months    Recent Outpatient Visits          3 weeks ago Impacted cerumen of right ear   [facility] representative at [facility] [place], [place], [place]   3 weeks ago Impacted cerumen of right Archivist at [facility], [place], [place]   3 months ago Neck pain   Holiday representative at [facility], [place], [place]   5 months ago History of constipation   Holiday representative at [facility], [place], [place]   5 months ago Neck pain   Holiday representative at [facility], [place], [employer]            In 3 months Moshe Cipro, Philis Kendall, Gaffer at [employer], [place]

## 2018-07-16 MED ORDER — TRAMADOL HCL 50 MG PO TABS: ORAL_TABLET | ORAL | 0 refills | Status: DC

## 2018-07-16 NOTE — Telephone Encounter (Signed)
Rx filled tramadol  Narx site reviewed.

## 2018-07-22 DIAGNOSIS — R197 Diarrhea, unspecified: Secondary | ICD-10-CM | POA: Diagnosis not present

## 2018-08-04 DIAGNOSIS — Z1211 Encounter for screening for malignant neoplasm of colon: Secondary | ICD-10-CM | POA: Diagnosis not present

## 2018-08-04 DIAGNOSIS — K6289 Other specified diseases of anus and rectum: Secondary | ICD-10-CM | POA: Diagnosis not present

## 2018-08-04 DIAGNOSIS — Z86010 Personal history of colonic polyps: Secondary | ICD-10-CM | POA: Diagnosis not present

## 2018-08-04 DIAGNOSIS — K623 Rectal prolapse: Secondary | ICD-10-CM | POA: Diagnosis not present

## 2018-08-04 DIAGNOSIS — Z9889 Other specified postprocedural states: Secondary | ICD-10-CM | POA: Diagnosis not present

## 2018-08-10 ENCOUNTER — Other Ambulatory Visit: Payer: Self-pay | Admitting: Medical

## 2018-08-10 DIAGNOSIS — M542 Cervicalgia: Secondary | ICD-10-CM

## 2018-08-10 MED ORDER — TRAMADOL HCL 50 MG PO TABS: ORAL_TABLET | ORAL | 0 refills | Status: DC

## 2018-08-10 NOTE — Telephone Encounter (Signed)
Copied from CRM [id number]. Topic: Quick Communication - See Telephone Encounter >> Aug 10, 2018 10:49 AM Trula Slade wrote: CRM for notification. See Telephone encounter for: 08/10/18. Patient would like a refill on his traMADol (ULTRAM) 50 MG tablet medication and have it sent to his preferred [facility].

## 2018-08-20 ENCOUNTER — Other Ambulatory Visit: Payer: Self-pay

## 2018-08-20 ENCOUNTER — Emergency Department (HOSPITAL_COMMUNITY)
Admission: EM | Admit: 2018-08-20 | Discharge: 2018-08-20 | Disposition: A | Discharge status: Home / Self Care | Payer: Medicare Other | Attending: Emergency Medicine | Admitting: Emergency Medicine

## 2018-08-20 ENCOUNTER — Emergency Department (HOSPITAL_COMMUNITY): Payer: Medicare Other

## 2018-08-20 DIAGNOSIS — Z79899 Other long term (current) drug therapy: Secondary | ICD-10-CM | POA: Insufficient documentation

## 2018-08-20 DIAGNOSIS — K5900 Constipation, unspecified: Secondary | ICD-10-CM | POA: Insufficient documentation

## 2018-08-20 DIAGNOSIS — R109 Unspecified abdominal pain: Secondary | ICD-10-CM | POA: Diagnosis not present

## 2018-08-20 DIAGNOSIS — R1084 Generalized abdominal pain: Secondary | ICD-10-CM | POA: Insufficient documentation

## 2018-08-20 LAB — CBC WITH DIFFERENTIAL/PLATELET
Abs Immature Granulocytes: 0.01 10*3/uL (ref 0.00–0.07)
Basophils Absolute: 0.1 10*3/uL (ref 0.0–0.1)
Basophils Relative: 2 %
Eosinophils Absolute: 0.1 10*3/uL (ref 0.0–0.5)
Eosinophils Relative: 3 %
HCT: 39.3 % (ref 39.0–52.0)
Hemoglobin: 13 g/dL (ref 13.0–17.0)
IMMATURE GRANULOCYTES: 0 %
LYMPHS PCT: 32 %
Lymphs Abs: 1.3 10*3/uL (ref 0.7–4.0)
MCH: 27.6 pg (ref 26.0–34.0)
MCHC: 33.1 g/dL (ref 30.0–36.0)
MCV: 83.4 fL (ref 80.0–100.0)
Monocytes Absolute: 0.3 10*3/uL (ref 0.1–1.0)
Monocytes Relative: 7 %
NEUTROS PCT: 56 %
Neutro Abs: 2.4 10*3/uL (ref 1.7–7.7)
Platelets: 201 10*3/uL (ref 150–400)
RBC: 4.71 MIL/uL (ref 4.22–5.81)
RDW: 13.4 % (ref 11.5–15.5)
WBC: 4.2 10*3/uL (ref 4.0–10.5)
nRBC: 0 % (ref 0.0–0.2)

## 2018-08-20 LAB — URINALYSIS, ROUTINE W REFLEX MICROSCOPIC
Bilirubin Urine: NEGATIVE
Glucose, UA: NEGATIVE mg/dL
HGB URINE DIPSTICK: NEGATIVE
Ketones, ur: NEGATIVE mg/dL
Leukocytes,Ua: NEGATIVE
Nitrite: NEGATIVE
Protein, ur: NEGATIVE mg/dL
Specific Gravity, Urine: 1.017 (ref 1.005–1.030)
pH: 6 (ref 5.0–8.0)

## 2018-08-20 LAB — COMPREHENSIVE METABOLIC PANEL
ALK PHOS: 54 U/L (ref 38–126)
ALT: 11 U/L (ref 0–44)
AST: 20 U/L (ref 15–41)
Albumin: 4 g/dL (ref 3.5–5.0)
Anion gap: 11 (ref 5–15)
BUN: 7 mg/dL — ABNORMAL LOW (ref 8–23)
CO2: 23 mmol/L (ref 22–32)
Calcium: 9.2 mg/dL (ref 8.9–10.3)
Chloride: 102 mmol/L (ref 98–111)
Creatinine, Ser: 1.03 mg/dL (ref 0.61–1.24)
GFR calc Af Amer: >60 mL/min (ref >60)
GFR calc non Af Amer: >60 mL/min (ref >60)
Glucose, Bld: 87 mg/dL (ref 70–99)
POTASSIUM: 3.5 mmol/L (ref 3.5–5.1)
Sodium: 136 mmol/L (ref 135–145)
Total Bilirubin: 0.7 mg/dL (ref 0.3–1.2)
Total Protein: 6.2 g/dL — ABNORMAL LOW (ref 6.5–8.1)

## 2018-08-20 LAB — LIPASE, BLOOD: Lipase: 23 U/L (ref 11–51)

## 2018-08-20 MED ORDER — POLYETHYLENE GLYCOL 3350 17 G PO PACK: 17.0000 g | PACK | Freq: Every day | ORAL | 0 refills | Status: DC

## 2018-08-20 MED ORDER — SODIUM CHLORIDE 0.9 % IV BOLUS
1000.0000 mL | Freq: Once | INTRAVENOUS | Status: AC
  Administered 2018-08-20: 1000 mL via INTRAVENOUS

## 2018-08-20 MED ORDER — IOHEXOL 300 MG/ML  SOLN
100.0000 mL | Freq: Once | INTRAMUSCULAR | Status: AC | PRN
  Administered 2018-08-20: 100 mL via INTRAVENOUS

## 2018-08-20 NOTE — Discharge Instructions (Addendum)
Your work up today identified that you are constipated. See the instructions below to help with constipation. Stay well hydrated, increased fiber intake in your diet. Continue your usual home medications. Alternate between tylenol and ibuprofen as needed for pain. Follow up with your regular doctor in 1 week for recheck of symptoms. Return to the ER for emergent changes or worsening symptoms.   For your constipation: You should start taking miralax as directed, try starting with using it 1-2 times daily until you achieve daily soft stools-- you can use more than 2 doses daily in order to achieve the first bowel movement and then cut back to 1-2 times daily or however often you need to take it in order to continue having the soft daily stools. May consider over the counter colace as well. If you need to use something more, then you can consider over the counter glycerin suppositories, enemas, or if absolutely necessary then you can use magnesium citrate.  Increase the fiber and water intake in your diet. See the information below regarding improving your bowel health. Follow up with your regular doctor in 1 week for recheck of symptoms. Return to the ER for changes or worsening symptoms.   GETTING TO GOOD BOWEL HEALTH. Irregular bowel habits such as constipation and diarrhea can lead to many problems over time.  Having one soft bowel movement a day is the most important way to prevent further problems.  The anorectal canal is designed to handle stretching and feces to safely manage our ability to get rid of solid waste (feces, poop, stool) out of our body.  BUT, hard constipated stools can act like ripping concrete bricks and diarrhea can be a burning fire to this very sensitive area of our body, causing inflamed hemorrhoids, anal fissures, increasing risk is perirectal abscesses, abdominal pain/bloating, an making irritable bowel worse.     The goal: ONE SOFT BOWEL MOVEMENT A DAY!  To have soft, regular bowel  movements:  Drink at least 8 tall glasses of water a day.   Take plenty of fiber.  Fiber is the undigested part of plant food that passes into the colon, acting s natures broom to encourage bowel motility and movement.  Fiber can absorb and hold large amounts of water. This results in a larger, bulkier stool, which is soft and easier to pass. Work gradually over several weeks up to 6 servings a day of fiber (25g a day even more if needed) in the form of: Vegetables -- Root (potatoes, carrots, turnips), leafy green (lettuce, salad greens, celery, spinach), or cooked high residue (cabbage, broccoli, etc) Fruit -- Fresh (unpeeled skin & pulp), Dried (prunes, apricots, cherries, etc ),  or stewed ( applesauce)  Whole grain breads, pasta, etc (whole wheat)  Bran cereals  Bulking Agents -- This type of water-retaining fiber generally is easily obtained each day by one of the following:  Psyllium bran -- The psyllium plant is remarkable because its ground seeds can retain so much water. This product is available as Metamucil, Konsyl, Effersyllium, Per Diem Fiber, or the less expensive generic preparation in drug and health food stores. Although labeled a laxative, it really is not a laxative.  Methylcellulose -- This is another fiber derived from wood which also retains water. It is available as Citrucel. Polyethylene Glycol - and artificial fiber commonly called Miralax or Glycolax.  It is helpful for people with gassy or bloated feelings with regular fiber Flax Seed - a less gassy fiber than psyllium No  reading or other relaxing activity while on the toilet. If bowel movements take longer than 5 minutes, you are too constipated AVOID CONSTIPATION.  High fiber and water intake usually takes care of this.  Sometimes a laxative is needed to stimulate more frequent bowel movements, but  Laxatives are not a good long-term solution as it can wear the colon out. Osmotics (Milk of Magnesia, Fleets phosphosoda,  Magnesium citrate, MiraLax, GoLytely) are safer than  Stimulants (Senokot, Castor Oil, Dulcolax, Ex Lax)    Do not take laxatives for more than 7days in a row.  IF SEVERELY CONSTIPATED, try a Bowel Retraining Program: Do not use laxatives.  Eat a diet high in roughage, such as bran cereals and leafy vegetables.  Drink six (6) ounces of prune or apricot juice each morning.  Eat two (2) large servings of stewed fruit each day.  Take one (1) heaping tablespoon of a psyllium-based bulking agent twice a day. Use sugar-free sweetener when possible to avoid excessive calories.  Eat a normal breakfast.  Set aside 15 minutes after breakfast to sit on the toilet, but do not strain to have a bowel movement.  If you do not have a bowel movement by the third day, use an enema and repeat the above steps.  Controlling diarrhea Switch to liquids and simpler foods for a few days to avoid stressing your intestines further. Avoid dairy products (especially milk & ice cream) for a short time.  The intestines often can lose the ability to digest lactose when stressed. Avoid foods that cause gassiness or bloating.  Typical foods include beans and other legumes, cabbage, broccoli, and dairy foods.  Every person has some sensitivity to other foods, so listen to our body and avoid those foods that trigger problems for you. Adding fiber (Citrucel, Metamucil, psyllium, Miralax) gradually can help thicken stools by absorbing excess fluid and retrain the intestines to act more normally.  Slowly increase the dose over a few weeks.  Too much fiber too soon can backfire and cause cramping & bloating. Probiotics (such as active yogurt, Align, etc) may help repopulate the intestines and colon with normal bacteria and calm down a sensitive digestive tract.  Most studies show it to be of mild help, though, and such products can be costly. Medicines: Bismuth subsalicylate (ex. Kayopectate, Pepto Bismol) every 30 minutes for up to 6  doses can help control diarrhea.  Avoid if pregnant. Loperamide (Immodium) can slow down diarrhea.  Start with two tablets (4mg  total) first and then try one tablet every 6 hours.  Avoid if you are having fevers or severe pain.  If you are not better or start feeling worse, stop all medicines and call your doctor for advice Call your doctor if you are getting worse or not better.  Sometimes further testing (cultures, endoscopy, X-ray studies, bloodwork, etc) may be needed to help diagnose and treat the cause of the diarrhea.  Managing Pain  Pain after surgery or related to activity is often due to strain/injury to muscle, tendon, nerves and/or incisions.  This pain is usually short-term and will improve in a few months.   Many people find it helpful to do the following things TOGETHER to help speed the process of healing and to get back to regular activity more quickly:  Avoid heavy physical activity  no lifting greater than 20 pounds Do not push through the pain.  Listen to your body and avoid positions and maneuvers than reproduce the pain Walking is okay as  tolerated, but go slowly and stop when getting sore.  Remember: If it hurts to do it, then dont do it! Take Anti-inflammatory medication  Take with food/snack around the clock for 1-2 weeks This helps the muscle and nerve tissues become less irritable and calm down faster Choose ONE of the following over-the-counter medications: Naproxen 220mg  tabs (ex. Aleve) 1-2 pills twice a day  Ibuprofen 200mg  tabs (ex. Advil, Motrin) 3-4 pills with every meal and just before bedtime Acetaminophen 500mg  tabs (Tylenol) 1-2 pills with every meal and just before bedtime Use a Heating pad or Ice/Cold Pack 4-6 times a day May use warm bath/hottub  or showers Try Gentle Massage and/or Stretching  at the area of pain many times a day stop if you feel pain - do not overdo it  Try these steps together to help you body heal faster and avoid making  things get worse.  Doing just one of these things may not be enough.    If you are not getting better after two weeks or are noticing you are getting worse, contact our office for further advice; we may need to re-evaluate you & see what other things we can do to help.

## 2018-08-20 NOTE — ED Triage Notes (Addendum)
Pt here for evaluation of abdominal pain and states that he has a significant history of bowel obstructions. LBM 2 days ago and watery. Denies bloody stools.

## 2018-08-20 NOTE — ED Notes (Signed)
ED Provider at bedside. 

## 2018-08-20 NOTE — ED Notes (Signed)
Pt transported to CT ?

## 2018-08-20 NOTE — ED Notes (Signed)
Pt states that he has to go to the restroom before RN able to obtain vital signs. Will obtain vital signs upon patient return to room.

## 2018-08-20 NOTE — ED Provider Notes (Signed)
  Face-to-face evaluation   History: He presents for evaluation no bowel movement in 2 or 3 days.  History of same and he thought he might have a bowel obstruction, currently.  He denies vomiting, dizziness, fever or chills.  Physical exam: Heart, calm, cooperative.  He is lucid.  No respiratory distress.  He walks with normal gait.  Medical screening examination/treatment/procedure(s) were conducted as a shared visit with non-physician practitioner(s) and myself.  I personally evaluated the patient during the encounter    Mancel Bale, MD 08/21/18 [phone]

## 2018-08-20 NOTE — ED Provider Notes (Signed)
[facility] EMERGENCY DEPARTMENT Provider Note   CSN: [id number] Arrival date & time: 08/20/18  1157    History   Chief Complaint Chief Complaint  Patient presents with   Abdominal Pain    HPI    [name] is a 63 y.o. 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  Patient states for the last 2 days he has had abdominal pain and constipation.  He describes his abdominal pain is 5/10 intermittent pressure nonradiating generalized abdominal pain that worsens with nothing and has been unrelieved with his Trulance and a another laxative that he cannot recall the name of.  He reports that he has not had a bowel movement in 2 days, the last bowel movement was somewhat watery but nonbloody.  He reports having a hemorrhoid which is typical for him and is not bothering him at this time.  He denies any rectal pain, rectal bleeding, hematochezia, or melena.  He also denies having any fevers, chills, chest pain, shortness of breath, nausea, vomiting, diarrhea, obstipation, dysuria, hematuria, numbness, tingling, focal weakness, or any other complaints at this time.  He denies any recent travel, sick contacts, suspicious food intake, alcohol use, or NSAID use.  His PCP is at [facility] person) and his GI is at [facility]   The history is provided by the patient and medical records. No language interpreter was used.  Abdominal Pain  Associated symptoms: constipation   Associated symptoms: no chest pain, no chills, no diarrhea, no dysuria, no fever, no hematuria, no nausea, no shortness of breath and no vomiting     Past Medical History:  Diagnosis Date   Anxiety    Anxiety and depression 03/14/2010   Colonic inertia    with chronic lifelong costipation   Congenital  fusion of cervical spine    Depression    FECAL INCONTINENCE 08/19/2007   H/O: GI bleed    Heart murmur    early childhood    Hydrocephalus --per CT 04/2014 07/23/2014   Idiopathic scoliosis    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   Pneumonia    childhood     Patient Active Problem List   Diagnosis Date Noted   Bloody stools 02/16/2017   Painful orthopaedic hardware (HCC) 10/22/2015   Small bowel obstruction due to adhesions (HCC) 05/13/2015   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   Patella fracture 12/14/2014   Constipation 11/21/2014   Annual physical exam 07/23/2014   Hydrocephalus --per CT 04/2014 07/23/2014   Dizziness and giddiness 05/09/2014   Obstipation 07/16/2011   FATIGUE 07/02/2010   DYSPNEA 07/02/2010   Anxiety and depression 03/14/2010   GI BLEEDING 03/14/2010   SCOLIOSIS 03/14/2010   ARTHRITIS 08/19/2007   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   FECAL INCONTINENCE 08/19/2007   INGUINAL HERNIA, HX OF 08/19/2007    Past Surgical History:  Procedure Laterality Date   COLONOSCOPY  11/08/2006   HARDWARE REMOVAL Right 10/23/2015   Procedure: RIGHT KNEE HARDWARE REMOVAL;  Surgeon: Ollen Gross, MD;  Location: [facility];  Service: Orthopedics;  Laterality: Right;  LMA   HERNIA REPAIR  1980   left inguinal hernia   LAPAROTOMY  07/18/2011   Procedure: EXPLORATORY LAPAROTOMY;  Surgeon: Mariella Saa, MD;  Location: [facility];  Service: General;  Laterality: N/A;  lysis of adhesions entero enterostomy  LAPAROTOMY  07/28/2011   Procedure: EXPLORATORY LAPAROTOMY;  Surgeon: Mariella SaaBenjamin T Hoxworth, MD;  Location: [facility];  Service: General;  Laterality: N/A;   Left Knee surgery  1994, 1992   ORIF PATELLA Right 12/14/2014   Procedure: OPEN REDUCTION INTERNAL (ORIF) FIXATION PATELLA;  Surgeon: Ollen GrossFrank Aluisio, MD;  Location: [facility];  Service: Orthopedics;  Laterality: Right;   Surgery for imperforate anus  1958   TONSILLECTOMY     UPPER GASTROINTESTINAL  ENDOSCOPY  05/15/03        Home Medications    Prior to Admission medications   Medication Sig Start Date End Date Taking? Authorizing Provider  amoxicillin-clavulanate (AUGMENTIN) 875-125 MG tablet Take 1 tablet by mouth 2 (two) times daily. 06/21/18   Saguier, Ramon DredgeEdward, PA-C  Plecanatide (TRULANCE) 3 MG TABS Take 3 mg by mouth daily. 03/06/18   Saguier, Ramon DredgeEdward, PA-C  traMADol Janean Sark(ULTRAM) 50 MG tablet 1 tab po tid prn pain 08/10/18   Saguier, Ramon DredgeEdward, PA-C    Family History Family History  Problem Relation Age of Onset   Alzheimer's disease Father    Diabetes Mother    Cancer Mother    Colon cancer Neg Hx    Prostate cancer Neg Hx     Social History Social History   Tobacco Use   Smoking status: Never Smoker   Smokeless tobacco: Never Used  Substance Use Topics   Alcohol use: No    Alcohol/week: 0.0 standard drinks   Drug use: No     Allergies   Patient has no known allergies.   Review of Systems Review of Systems  Constitutional: Negative for chills and fever.  Respiratory: Negative for shortness of breath.   Cardiovascular: Negative for chest pain.  Gastrointestinal: Positive for abdominal pain and constipation. Negative for anal bleeding, blood in stool, diarrhea, nausea, rectal pain and vomiting.  Genitourinary: Negative for dysuria and hematuria.  Musculoskeletal: Negative for arthralgias and myalgias.  Skin: Negative for color change.  Allergic/Immunologic: Negative for immunocompromised state.  Neurological: Negative for weakness and numbness.  Psychiatric/Behavioral: Negative for confusion.   All other systems reviewed and are negative for acute change except as noted in the HPI.    Physical Exam Updated Vital Signs BP 117/81    Pulse 65    Temp 98 F (36.7 C) (Oral)    Resp 16    Ht 5\' 7"  (1.702 m)    Wt 53.5 kg    SpO2 100%    BMI 18.48 kg/m   Physical Exam Vitals signs and nursing note reviewed. Exam conducted with a chaperone present.    Constitutional:      General: He is not in acute distress.    Appearance: Normal appearance. He is well-developed and underweight. He is not toxic-appearing.     Comments: Afebrile, nontoxic, NAD, appears thin  HENT:     Head: Normocephalic and atraumatic.  Eyes:     General:        Right eye: No discharge.        Left eye: No discharge.     Conjunctiva/sclera: Conjunctivae normal.  Neck:     Musculoskeletal: Normal range of motion and neck supple.  Cardiovascular:     Rate and Rhythm: Normal rate and regular rhythm.     Pulses: Normal pulses.     Heart sounds: Normal heart sounds, S1 normal and S2 normal. No murmur. No friction rub. No gallop.   Pulmonary:     Effort: Pulmonary effort is normal. No respiratory distress.  Breath sounds: Normal breath sounds. No decreased breath sounds, wheezing, rhonchi or rales.  Abdominal:     General: Bowel sounds are normal. There is no distension.     Palpations: Abdomen is soft. Abdomen is not rigid.     Tenderness: There is generalized abdominal tenderness. There is no right CVA tenderness, left CVA tenderness, guarding or rebound. Negative signs include Murphy's sign and McBurney's sign.     Comments: Soft, nondistended, +BS throughout, with mild generalized abd TTP mostly on R side and across lower abdomen, no r/g/r, neg murphy's, neg mcburney's, no CVA TTP   Genitourinary:    Prostate: Normal.     Rectum: Internal hemorrhoid present. No mass, tenderness, anal fissure or external hemorrhoid. Abnormal anal tone (chronic per pt).     Comments: Chaperone present Poor anal tone which pt states is chronic, no rectal tenderness, no mass or fissure, +prolapsed internal hemorrhoid which is easily reduced and does not appear thrombosed, slightly friable with scant red blood noted to glove after palpating this area, no definite external hemorrhoids. Dark brown stool noted to glove after rectal exam, no frank hematochezia, no fecal impaction.   Prostate without enlargement, warmth, tenderness, or bogginess.  Musculoskeletal: Normal range of motion.  Skin:    General: Skin is warm and dry.     Findings: No rash.  Neurological:     Mental Status: He is alert and oriented to person, place, and time.     Sensory: Sensation is intact. No sensory deficit.     Motor: Motor function is intact.  Psychiatric:        Mood and Affect: Mood and affect normal.        Behavior: Behavior normal.      ED Treatments / Results  Labs (all labs ordered are listed, but only abnormal results are displayed) Labs Reviewed  COMPREHENSIVE METABOLIC PANEL - Abnormal; Notable for the following components:      Result Value   BUN 7 (*)    Total Protein 6.2 (*)    All other components within normal limits  CBC WITH DIFFERENTIAL/PLATELET  LIPASE, BLOOD  URINALYSIS, ROUTINE W REFLEX MICROSCOPIC    EKG None  Radiology Ct Abdomen Pelvis W Contrast  Result Date: 08/20/2018 CLINICAL DATA:  Abdominal pain. History of bowel obstructions. Last bowel movement 2 days ago. Prior bowel obstruction surgeries. Right inguinal hernia repair. EXAM: CT ABDOMEN AND PELVIS WITH CONTRAST TECHNIQUE: Multidetector CT imaging of the abdomen and pelvis was performed using the standard protocol following bolus administration of intravenous contrast. CONTRAST:  OMNIPAQUE IOHEXOL 300 MG/ML  SOLN COMPARISON:  02/10/2018 plain film.  05/13/2015 CT. FINDINGS: Lower chest: Clear lung bases. Normal heart size without pericardial or pleural effusion. Hepatobiliary: Normal liver. Normal gallbladder, without biliary ductal dilatation. Pancreas: Normal, without mass or ductal dilatation. Spleen: Normal in size, without focal abnormality. Adrenals/Urinary Tract: Normal adrenal glands. Malrotated left kidney. Normal right kidney, without hydronephrosis. Normal urinary bladder. Stomach/Bowel: Normal stomach, without wall thickening. Large colonic stool burden again identified.  Appendix not visualized, but no right lower quadrant inflammation is seen. Normal small bowel caliber. Prior enterotomy. Vascular/Lymphatic: Normal caliber of the aorta and branch vessels. No abdominopelvic adenopathy. Reproductive: Normal prostate. Other: No significant free fluid. Musculoskeletal: Right paramidline abdominal wall subcutaneous nodule of 1.3 cm on image 36/3 is present on the prior of 2016. Degenerative partial fusion of the bilateral sacroiliac joints. Convex right lumbar spine curvature. IMPRESSION: 1. Possible constipation. No other explanation for patient's symptoms.  2. No bowel obstruction. 3. Right abdominal wall nodule is grossly similar to 2016. Consider physical exam correlation. Electronically Signed   By: Jeronimo Greaves M.D.   On: 08/20/2018 14:26    Procedures Procedures (including critical care time)  Medications Ordered in ED Medications  sodium chloride 0.9 % bolus 1,000 mL (1,000 mLs Intravenous New Bag/Given 08/20/18 1239)  iohexol (OMNIPAQUE) 300 MG/ML solution 100 mL (100 mLs Intravenous Contrast Given 08/20/18 1359)     Initial Impression / Assessment and Plan / ED Course  I have reviewed the triage vital signs and the nursing notes.  Pertinent labs & imaging results that were available during my care of the patient were reviewed by me and considered in my medical decision making (see chart for details).        63 y.o. male here with abdominal pain for the last 2 days concerned about a bowel obstruction.  On exam, mild generalized abdominal tenderness mostly on the right side and lower abdomen, but non-peritoneal, adequate bowel sounds throughout.  Rectal exam revealed a small internal hemorrhoid which is prolapsed but is easily reduced, somewhat friable with some scant blood noted on glove, but no frank hematochezia, no fecal impaction, some dark brown stool noted to glove, poor rectal tone but pt states this is chronic and stable. Suspect some component of his  chronic constipation vs partial SBO. Will get labs and CT imaging, will reassess shortly. Pt declines wanting anything for pain at this time. Will reassess shortly.   3:33 PM CBC w/diff WNL. CMP fairly unremarkable. Lipase WNL. U/A unremarkable. CT abd/pelv with large colonic stool burden but no obstruction or other acute findings. Suspect constipation is largely to blame for his abdominal discomfort. Will start on miralax regimen, advised other OTC remedies for symptomatic relief, and f/up with PCP in 1wk for recheck. I explained the diagnosis and have given explicit precautions to return to the ER including for any other new or worsening symptoms. The patient understands and accepts the medical plan as it's been dictated and I have answered their questions. Discharge instructions concerning home care and prescriptions have been given. The patient is STABLE and is discharged to home in good condition.    Final Clinical Impressions(s) / ED Diagnoses   Final diagnoses:  Generalized abdominal pain  Constipation, unspecified constipation type    ED Discharge Orders         Ordered    polyethylene glycol Eather Colas / GLYCOLAX) packet  Daily     08/20/18 [address] [place], [place] 08/20/18 1533    Mancel Bale, MD 08/21/18 [phone]

## 2018-08-22 ENCOUNTER — Telehealth: Payer: Self-pay

## 2018-08-22 NOTE — Telephone Encounter (Signed)
Copied from CRM [id number]. Topic: Referral - Request for Referral >> Aug 22, 2018  1:02 PM Angela Nevin wrote: Has patient seen PCP for this complaint? Yes.   Referral for which specialty: Laurette Schimke Preferred provider/office: None specified  Reason for referral:  Recurring abdominal issues- history of

## 2018-08-22 NOTE — Telephone Encounter (Signed)
Pt seen recently in the ED for abdomen pain. History of bowel obstruction. He is requesting referral to GI. But they might be closed for some time. So I need to know how much pain he is in. He might needs office visit. Know we are not seeing patients in office but with his history might need to make exception. Need to know level of pain in abdomen and has he had bm since leaving ED. Any vomiting?

## 2018-08-24 NOTE — Telephone Encounter (Signed)
Patient returning call to [facility]. Patient states that his pain level is a 4/10 and he has had very little success with a bowel movement since leaving the ED on 08/20/2018. States that he has not had any vomiting, but he is very miserable.

## 2018-08-24 NOTE — Telephone Encounter (Signed)
Tried to reach pt. Pt has no vm telephone just rings

## 2018-08-25 NOTE — Telephone Encounter (Signed)
Spoke with pt. Pt states he is now having diarrhea pretty bad. Pt scheduled for tomorrow

## 2018-08-25 NOTE — Telephone Encounter (Signed)
Has patient been taking his trulance daily? He has history of abdomen surgery and obstruction. I can't do web ex or phone visit under circumstances physical exam very important. I am at home today. Probably best for him to be evaluated in the ED again today. We tried to call him the other day but no answer.  See how he is doing presently.   I could come in later today in afternoon(but if today afternoon needs to opened up) or he could scheduled tomorrow morning but he needs to be made aware even if I evaluate him might recommend ED evaluation.

## 2018-08-26 ENCOUNTER — Ambulatory Visit (INDEPENDENT_AMBULATORY_CARE_PROVIDER_SITE_OTHER): Payer: Medicare Other | Admitting: Medical

## 2018-08-26 ENCOUNTER — Ambulatory Visit (HOSPITAL_BASED_OUTPATIENT_CLINIC_OR_DEPARTMENT_OTHER)
Admission: RE | Admit: 2018-08-26 | Discharge: 2018-08-26 | Disposition: A | Discharge status: Home / Self Care | Payer: Medicare Other | Source: Ambulatory Visit | Attending: Medical | Admitting: Medical

## 2018-08-26 ENCOUNTER — Encounter: Payer: Self-pay | Admitting: Medical

## 2018-08-26 ENCOUNTER — Other Ambulatory Visit: Payer: Self-pay

## 2018-08-26 VITALS — BP 104/80 | HR 55 | Temp 97.4°F | Resp 16 | Ht 67.0 in | Wt 118.6 lb

## 2018-08-26 DIAGNOSIS — M542 Cervicalgia: Secondary | ICD-10-CM | POA: Diagnosis not present

## 2018-08-26 DIAGNOSIS — R5383 Other fatigue: Secondary | ICD-10-CM

## 2018-08-26 DIAGNOSIS — Z8719 Personal history of other diseases of the digestive system: Secondary | ICD-10-CM

## 2018-08-26 DIAGNOSIS — K5900 Constipation, unspecified: Secondary | ICD-10-CM | POA: Diagnosis not present

## 2018-08-26 LAB — CBC WITH DIFFERENTIAL/PLATELET
Basophils Absolute: 0.1 10*3/uL (ref 0.0–0.1)
Basophils Relative: 1.4 % (ref 0.0–3.0)
Eosinophils Absolute: 0.2 10*3/uL (ref 0.0–0.7)
Eosinophils Relative: 5 % (ref 0.0–5.0)
HCT: 41.2 % (ref 39.0–52.0)
Hemoglobin: 13.9 g/dL (ref 13.0–17.0)
Lymphocytes Relative: 31.9 % (ref 12.0–46.0)
Lymphs Abs: 1.3 10*3/uL (ref 0.7–4.0)
MCHC: 33.8 g/dL (ref 30.0–36.0)
MCV: 83.2 fl (ref 78.0–100.0)
Monocytes Absolute: 0.3 10*3/uL (ref 0.1–1.0)
Monocytes Relative: 6.2 % (ref 3.0–12.0)
Neutro Abs: 2.3 10*3/uL (ref 1.4–7.7)
Neutrophils Relative %: 55.5 % (ref 43.0–77.0)
Platelets: 237 10*3/uL (ref 150.0–400.0)
RBC: 4.95 Mil/uL (ref 4.22–5.81)
RDW: 13.9 % (ref 11.5–15.5)
WBC: 4.1 10*3/uL (ref 4.0–10.5)

## 2018-08-26 LAB — COMPREHENSIVE METABOLIC PANEL
ALT: 8 U/L (ref 0–53)
AST: 17 U/L (ref 0–37)
Albumin: 4.4 g/dL (ref 3.5–5.2)
Alkaline Phosphatase: 58 U/L (ref 39–117)
BUN: 13 mg/dL (ref 6–23)
CO2: 29 mEq/L (ref 19–32)
Calcium: 9.4 mg/dL (ref 8.4–10.5)
Chloride: 99 mEq/L (ref 96–112)
Creatinine, Ser: 1.04 mg/dL (ref 0.40–1.50)
GFR: 72.45 mL/min (ref >60.00)
Glucose, Bld: 78 mg/dL (ref 70–99)
Potassium: 4 mEq/L (ref 3.5–5.1)
Sodium: 137 mEq/L (ref 135–145)
Total Bilirubin: 0.5 mg/dL (ref 0.2–1.2)
Total Protein: 6.6 g/dL (ref 6.0–8.3)

## 2018-08-26 NOTE — Patient Instructions (Addendum)
You do have history of chronic intermittent constipation with history of bowel obstructions as well.  I am glad that you are not having to use tramadol recently as that can predispose you to constipation.  Recently since he left the emergency department you are having small loose stools after you eat.  I do want to get x-ray of your abdomen today to evaluate the volume of stool that might be present as it September you had a massive amount of stool on that x-ray and sometimes you can have leaking type stools present.  Recent CT abdomen was negative for obstruction and I do not think based on your exam today that repeating CT is necessary.  We will follow your x-ray results and then potentially advise you on using a lower dose to [place] as you report in the past when you used full tablet that caused you to have diarrhea.  Some might consider reducing the dose to 1/2 tablet a day.  I will let you know after review of x-ray.  You have recently decreased your oral intake due to having loose stools after eating.  Reports some slight fatigue and lightheadedness so we will get a CBC and metabolic panel.  Would recommend proceeding with caution and making sure that you stay well-hydrated and eating bland foods.  We will give you an update on lab results when those are in.  With your history of abdomen surgeries and history of bowel obstruction will need to proceed with caution.  If you do start to get severely constipated or have increasing abdomen pain then you would need to be seen in the emergency department.  Presently you have good bowel sounds and soft abdomen so obstruction does not appear to be the case presently.  Follow-up date to be determined after reviewing studies and see how you respond to treatment plan.  Esperanza Richters, PA-C

## 2018-08-26 NOTE — Progress Notes (Signed)
Subjective:    Patient ID: [name], male    DOB: 01/26/1956, 63 y.o.   MRN: [id number]  HPI  Pt in for evaluation of his some loose stools but before that he was constipated and evaluated in the ED.   ED summary. 63 y.o. male here with abdominal pain for the last 2 days concerned about a bowel obstruction.  On exam, mild generalized abdominal tenderness mostly on the right side and lower abdomen, but non-peritoneal, adequate bowel sounds throughout.  Rectal exam revealed a small internal hemorrhoid which is prolapsed but is easily reduced, somewhat friable with some scant blood noted on glove, but no frank hematochezia, no fecal impaction, some dark brown stool noted to glove, poor rectal tone but pt states this is chronic and stable. Suspect some component of his chronic constipation vs partial SBO. Will get labs and CT imaging, will reassess shortly. Pt declines wanting anything for pain at this time. Will reassess shortly.   3:33 PM CBC w/diff WNL. CMP fairly unremarkable. Lipase WNL. U/A unremarkable. CT abd/pelv with large colonic stool burden but no obstruction or other acute findings. Suspect constipation is largely to blame for his abdominal discomfort. Will start on miralax regimen, advised other OTC remedies for symptomatic relief, and f/up with PCP in 1wk for recheck. I explained the diagnosis and have given explicit precautions to return to the ER including for any other new or worsening symptoms. The patient understands and accepts the medical plan as it's been dictated and I have answered their questions. Discharge instructions concerning home care and prescriptions have been given. The patient is STABLE and is discharged to home in good condition.  Before he went to ED he was constipated for 3 days. On review he has not been using his tramadol as his chronic pain is controlled.  Pt states he was using trulance in the past but not in the preceeding week before ED evaluation.  He states this helps his constipation in the past but if used whole tablet would have diarrhea.   ED gave pt miralax so he has not been using that.   Pt states he is eating less overall. He states small watery stool after eating. Like diarrhea but not a lot volume. Pt states stomach feels fine currently but he will have pain if he eats will have loose stool  He feels a little weak and light headed since he is not eating as much over past week.  He had colonoscopy scheduled by GI MD but that was canceled. He did not clear out colon after prep so procedure was canceled.   Review of Systems  Constitutional: Positive for fatigue. Negative for chills and fever.  Respiratory: Negative for cough, chest tightness, shortness of breath and wheezing.   Cardiovascular: Negative for chest pain and palpitations.  Gastrointestinal: Positive for constipation and diarrhea. Negative for abdominal distention and abdominal pain.  Genitourinary: Negative for difficulty urinating and enuresis.  Skin: Negative for rash.    Past Medical History:  Diagnosis Date  . Anxiety   . Anxiety and depression 03/14/2010  . Colonic inertia    with chronic lifelong costipation  . Congenital fusion of cervical spine   . Depression   . FECAL INCONTINENCE 08/19/2007  . H/O: GI bleed   . Heart murmur    early childhood   . Hydrocephalus --per CT 04/2014 07/23/2014  . Idiopathic scoliosis   . 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 Pneumonia    childhood  Social History   Socioeconomic History  . Marital status: Single    Spouse name: Not on file  . Number of children: 0  . Years of education: Not on file  . Highest education level: Not on file  Occupational History  . 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 Financial resource strain: Not on file  . Food insecurity:    Worry: Not on file    Inability: Not on file  . Transportation needs:    Medical: Not on file     Non-medical: Not on file  Tobacco Use  . Smoking status: Never Smoker  . Smokeless tobacco: Never Used  Substance and Sexual Activity  . Alcohol use: No    Alcohol/week: 0.0 standard drinks  . Drug use: No  . Sexual activity: Yes  Lifestyle  . Physical activity:    Days per week: Not on file    Minutes per session: Not on file  . Stress: Not on file  Relationships  . Social connections:    Talks on phone: Not on file    Gets together: Not on file    Attends religious service: Not on file    Active member of club or organization: Not on file    Attends meetings of clubs or organizations: Not on file    Relationship status: Not on file  . Intimate partner violence:    Fear of current or ex partner: Not on file    Emotionally abused: Not on file    Physically abused: Not on file    Forced sexual activity: Not on file  Other Topics Concern  . Not on file  Social History Narrative   Lives w/ mother , has a younger brother and sister     Past Surgical History:  Procedure Laterality Date  . COLONOSCOPY  11/08/2006  . HARDWARE REMOVAL Right 10/23/2015   Procedure: RIGHT KNEE HARDWARE REMOVAL;  Surgeon: Ollen Gross, MD;  Location: [facility];  Service: Orthopedics;  Laterality: Right;  LMA  . HERNIA REPAIR  1980   left inguinal hernia  . LAPAROTOMY  07/18/2011   Procedure: EXPLORATORY LAPAROTOMY;  Surgeon: Mariella Saa, MD;  Location: [facility];  Service: General;  Laterality: N/A;  lysis of adhesions entero enterostomy  . LAPAROTOMY  07/28/2011   Procedure: EXPLORATORY LAPAROTOMY;  Surgeon: Mariella Saa, MD;  Location: [facility];  Service: General;  Laterality: N/A;  . Left Knee surgery  1994, 1992  . ORIF PATELLA Right 12/14/2014   Procedure: OPEN REDUCTION INTERNAL (ORIF) FIXATION PATELLA;  Surgeon: Ollen Gross, MD;  Location: [facility];  Service: Orthopedics;  Laterality: Right;  . Surgery for imperforate anus  1958  . TONSILLECTOMY    . UPPER GASTROINTESTINAL ENDOSCOPY   05/15/03    Family History  Problem Relation Age of Onset  . Alzheimer's disease Father   . Diabetes Mother   . Cancer Mother   . Colon cancer Neg Hx   . Prostate cancer Neg Hx     No Known Allergies  Current Outpatient Medications on File Prior to Visit  Medication Sig Dispense Refill  . amoxicillin-clavulanate (AUGMENTIN) 875-125 MG tablet Take 1 tablet by mouth 2 (two) times daily. 20 tablet 0  . Plecanatide (TRULANCE) 3 MG TABS Take 3 mg by mouth daily. 30 tablet 3  . polyethylene glycol (MIRALAX / GLYCOLAX) packet Take 17 g by mouth daily. Take daily until you achieve soft daily bowel movements, then  titrate dosing to continue having daily soft bowel movements 30 each 0  . traMADol (ULTRAM) 50 MG tablet 1 tab po tid prn pain 90 tablet 0   No current facility-administered medications on file prior to visit.     BP 104/80   Pulse (!) 55   Temp (!) 97.4 F (36.3 C) (Oral)   Resp 16   Ht 5\' 7"  (1.702 m)   Wt 118 lb 9.6 oz (53.8 kg)   SpO2 100%   BMI 18.58 kg/m    Past Medical History:  Diagnosis Date  . Anxiety   . Anxiety and depression 03/14/2010  . Colonic inertia    with chronic lifelong costipation  . Congenital fusion of cervical spine   . Depression   . FECAL INCONTINENCE 08/19/2007  . H/O: GI bleed   . Heart murmur    early childhood   . Hydrocephalus --per CT 04/2014 07/23/2014  . Idiopathic scoliosis   . 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 Pneumonia    childhood      Social History   Socioeconomic History  . Marital status: Single    Spouse name: Not on file  . Number of children: 0  . Years of education: Not on file  . Highest education level: Not on file  Occupational History  . 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 Financial resource strain: Not on file  . Food insecurity:    Worry: Not on file    Inability: Not on file  . Transportation needs:    Medical: Not on file    Non-medical: Not on file   Tobacco Use  . Smoking status: Never Smoker  . Smokeless tobacco: Never Used  Substance and Sexual Activity  . Alcohol use: No    Alcohol/week: 0.0 standard drinks  . Drug use: No  . Sexual activity: Yes  Lifestyle  . Physical activity:    Days per week: Not on file    Minutes per session: Not on file  . Stress: Not on file  Relationships  . Social connections:    Talks on phone: Not on file    Gets together: Not on file    Attends religious service: Not on file    Active member of club or organization: Not on file    Attends meetings of clubs or organizations: Not on file    Relationship status: Not on file  . Intimate partner violence:    Fear of current or ex partner: Not on file    Emotionally abused: Not on file    Physically abused: Not on file    Forced sexual activity: Not on file  Other Topics Concern  . Not on file  Social History Narrative   Lives w/ mother , has a younger brother and sister     Past Surgical History:  Procedure Laterality Date  . COLONOSCOPY  11/08/2006  . HARDWARE REMOVAL Right 10/23/2015   Procedure: RIGHT KNEE HARDWARE REMOVAL;  Surgeon: Ollen Gross, MD;  Location: [facility];  Service: Orthopedics;  Laterality: Right;  LMA  . HERNIA REPAIR  1980   left inguinal hernia  . LAPAROTOMY  07/18/2011   Procedure: EXPLORATORY LAPAROTOMY;  Surgeon: Mariella Saa, MD;  Location: [facility];  Service: General;  Laterality: N/A;  lysis of adhesions entero enterostomy  . LAPAROTOMY  07/28/2011   Procedure: EXPLORATORY LAPAROTOMY;  Surgeon: Mariella Saa, MD;  Location: [facility];  Service: General;  Laterality:  N/A;  . Left Knee surgery  1994, 1992  . ORIF PATELLA Right 12/14/2014   Procedure: OPEN REDUCTION INTERNAL (ORIF) FIXATION PATELLA;  Surgeon: Ollen Gross, MD;  Location: [facility];  Service: Orthopedics;  Laterality: Right;  . Surgery for imperforate anus  1958  . TONSILLECTOMY    . UPPER GASTROINTESTINAL ENDOSCOPY  05/15/03    Family  History  Problem Relation Age of Onset  . Alzheimer's disease Father   . Diabetes Mother   . Cancer Mother   . Colon cancer Neg Hx   . Prostate cancer Neg Hx     No Known Allergies  Current Outpatient Medications on File Prior to Visit  Medication Sig Dispense Refill  . amoxicillin-clavulanate (AUGMENTIN) 875-125 MG tablet Take 1 tablet by mouth 2 (two) times daily. 20 tablet 0  . Plecanatide (TRULANCE) 3 MG TABS Take 3 mg by mouth daily. 30 tablet 3  . polyethylene glycol (MIRALAX / GLYCOLAX) packet Take 17 g by mouth daily. Take daily until you achieve soft daily bowel movements, then titrate dosing to continue having daily soft bowel movements 30 each 0  . traMADol (ULTRAM) 50 MG tablet 1 tab po tid prn pain 90 tablet 0   No current facility-administered medications on file prior to visit.     BP 104/80   Pulse (!) 55   Temp (!) 97.4 F (36.3 C) (Oral)   Resp 16   Ht  (1.702 m)   Wt 118 lb 9.6 oz (53.8 kg)   SpO2 100%   BMI 18.58 kg/m    General Appearance- Not in acute distress.  HEENT Eyes- Scleraeral/Conjuntiva-bilat- Not Yellow. Mouth & Throat- Normal.  Chest and Lung Exam Auscultation: Breath sounds:-Normal. Adventitious sounds:- No Adventitious sounds.  Cardiovascular Auscultation:Rythm - Regular. Heart Sounds -Normal heart sounds.  Abdomen Inspection:-Inspection Normal.  Palpation/Perucssion: Palpation and Percussion of the abdomen reveal- Non Tender, No Rebound tenderness, No rigidity(Guarding) and No Palpable abdominal masses.  + bowel sounds. Scars apparent on abdomen. Liver:-Normal.  Spleen:- Normal.        Objective:   Physical Exam  General Appearance- Not in acute distress.  HEENT Eyes- Scleraeral/Conjuntiva-bilat- Not Yellow. Mouth & Throat- Normal.  Chest and Lung Exam Auscultation: Breath sounds:-Normal. Adventitious sounds:- No Adventitious sounds.  Cardiovascular Auscultation:Rythm - Regular. Heart Sounds -Normal heart  sounds.  Abdomen Inspection:-Inspection Normal.  Palpation/Perucssion: Palpation and Percussion of the abdomen reveal- Non Tender, No Rebound tenderness, No rigidity(Guarding) and No Palpable abdominal masses.  Liver:-Normal.  Spleen:- Normal.         Assessment & Plan:  You do have history of chronic intermittent constipation with history of bowel obstructions as well.  I am glad that you are not having to use tramadol recently as that can predispose you to constipation.  Recently since he left the emergency department you are having small loose stools after you eat.  I do want to get x-ray of your abdomen today to evaluate the volume of stool that might be present as it September you had a massive amount of stool on that x-ray and sometimes you can have leaking type stools present.  Recent CT abdomen was negative for obstruction and I do not think based on your exam today that repeating CT is necessary.  We will follow your x-ray results and then potentially advise you on using a lower dose to [place] as you report in the past when you used full tablet that caused you to have diarrhea.  Some might consider  reducing the dose to 1/2 tablet a day.  I will let you know after review of x-ray.  You have recently decreased your oral intake due to having loose stools after eating.  Reports some slight fatigue and lightheadedness so we will get a CBC and metabolic panel.  Would recommend proceeding with caution and making sure that you stay well-hydrated and eating bland foods.  We will give you an update on lab results when those are in.  With your history of abdomen surgeries and history of bowel obstruction will need to proceed with caution.  If you do start to get severely constipated or have increasing abdomen pain then you would need to be seen in the emergency department.  Presently you have good bowel sounds and soft abdomen so obstruction does not appear to be the case presently.  Follow-up date  to be determined after reviewing studies and see how you respond to treatment plan.

## 2018-08-29 ENCOUNTER — Telehealth: Payer: Self-pay

## 2018-08-29 NOTE — Telephone Encounter (Signed)
Patient called office given lab results per E. Saguier,PA-C. Patient agreed.

## 2018-09-06 DIAGNOSIS — Q423 Congenital absence, atresia and stenosis of anus without fistula: Secondary | ICD-10-CM | POA: Diagnosis not present

## 2018-09-06 DIAGNOSIS — R159 Full incontinence of feces: Secondary | ICD-10-CM | POA: Diagnosis not present

## 2018-09-06 DIAGNOSIS — K5909 Other constipation: Secondary | ICD-10-CM | POA: Diagnosis not present

## 2018-09-07 ENCOUNTER — Telehealth: Payer: Self-pay | Admitting: Medical

## 2018-09-07 NOTE — Telephone Encounter (Unsigned)
Copied from CRM #[id number]. Topic: Quick Communication - Rx Refill/Question >> Sep 07, 2018  2:59 PM Mcneil, Ja-Kwan wrote: Medication: traMADol (ULTRAM) 50 MG tablet  Has the patient contacted their pharmacy? no  Preferred Pharmacy (with phone number or street name): [facility] [address] [place] - [address] [phone] (Phone) [phone] (Fax)  Agent: Please be advised that RX refills may take up to 3 business days. We ask that you follow-up with your pharmacy.

## 2018-09-07 NOTE — Telephone Encounter (Signed)
Refill Request: Tramadol   Last RX:08/10/18 Last OV:08/26/18 Next MO:QHUT scheduled  UDS: CSC:06/11/17 CSR:06/11/17

## 2018-09-11 ENCOUNTER — Other Ambulatory Visit: Payer: Self-pay | Admitting: Medical

## 2018-09-11 DIAGNOSIS — M542 Cervicalgia: Secondary | ICD-10-CM

## 2018-09-12 ENCOUNTER — Other Ambulatory Visit: Payer: Self-pay

## 2018-09-12 DIAGNOSIS — M542 Cervicalgia: Secondary | ICD-10-CM

## 2018-09-12 MED ORDER — TRAMADOL HCL 50 MG PO TABS: ORAL_TABLET | ORAL | 0 refills | Status: DC

## 2018-09-12 NOTE — Telephone Encounter (Signed)
Refilled his rx today.

## 2018-09-12 NOTE — Telephone Encounter (Signed)
Refill Request:Tramadol   Last RX:08/10/18 Last OV:08/26/18 Next DU:KGUR scheduled  UDS: 11/29/17 CSC:04/07/18 CSR:

## 2018-09-12 NOTE — Telephone Encounter (Signed)
Refilled his tramadol 

## 2018-09-21 DIAGNOSIS — K5900 Constipation, unspecified: Secondary | ICD-10-CM | POA: Diagnosis not present

## 2018-09-26 ENCOUNTER — Telehealth: Payer: Self-pay | Admitting: *Deleted

## 2018-09-26 NOTE — Telephone Encounter (Signed)
Received X-Ray Abdomen, for GI motility, Imaging Results from [facility]; forwarded to provider/SLS 05/04

## 2018-09-29 ENCOUNTER — Telehealth: Payer: Self-pay | Admitting: Medical

## 2018-09-29 NOTE — Telephone Encounter (Signed)
I saw pt early April and ordrered xray which he got done downstairs. Now getting report end for other xray done end of April. Looks like done by his GI Dr. Merri Brunette.Will scan xray to epic.

## 2018-09-30 ENCOUNTER — Telehealth: Payer: Self-pay | Admitting: *Deleted

## 2018-10-10 ENCOUNTER — Other Ambulatory Visit: Payer: Self-pay | Admitting: Medical

## 2018-10-10 DIAGNOSIS — M542 Cervicalgia: Secondary | ICD-10-CM

## 2018-10-10 MED ORDER — TRAMADOL HCL 50 MG PO TABS: ORAL_TABLET | ORAL | 0 refills | Status: DC

## 2018-10-10 NOTE — Telephone Encounter (Signed)
Copied from CRM [id number]. Topic: Quick Communication - Rx Refill/Question >> Oct 10, 2018  9:26 AM Angela Nevin wrote: Medication: traMADol (ULTRAM) 50 MG tablet   Patient is requesting a refill of this medication.    Preferred Pharmacy (with phone number or street name):[facility] [address] [place] - [address] [phone] (Phone) [phone] (Fax)

## 2018-10-10 NOTE — Telephone Encounter (Signed)
Rx tramadol sent to pt pharmacy. When did pt signs last controlled med contract?

## 2018-10-31 ENCOUNTER — Ambulatory Visit: Payer: Self-pay | Admitting: *Deleted

## 2018-11-08 ENCOUNTER — Other Ambulatory Visit: Payer: Self-pay | Admitting: Medical

## 2018-11-08 DIAGNOSIS — M542 Cervicalgia: Secondary | ICD-10-CM

## 2018-11-08 MED ORDER — TRAMADOL HCL 50 MG PO TABS: ORAL_TABLET | ORAL | 0 refills | Status: DC

## 2018-11-08 NOTE — Telephone Encounter (Signed)
For some reason epic won't let me send his tramadol electronically. Looks like he next month he due for uds so will you go ahead and get him to come in and give that. Also it looks like needs update contract as well.

## 2018-11-08 NOTE — Telephone Encounter (Signed)
Can just come in for uds and contract. I finally was able to send in refill

## 2018-11-08 NOTE — Addendum Note (Signed)
Addended by: Anabel Halon on: 11/08/2018 12:58 PM   Modules accepted: Orders

## 2018-11-08 NOTE — Telephone Encounter (Signed)
Does pt need an appointment or just to come for UDS and contract?

## 2018-11-08 NOTE — Telephone Encounter (Signed)
Copied from [place] [id number]. Topic: Quick Communication - Rx Refill/Question >> Nov 08, 2018 10:40 AM Mathis Bud wrote: Medication: traMADol (ULTRAM) 50 MG tablet   Has the patient contacted their pharmacy? No more refills  Preferred Pharmacy (with phone number or street name): [facility] [address] [place] - [place] [phone] (Phone) [phone] (Fax)    Agent: Please be advised that RX refills may take up to 3 business days. We ask that you follow-up with your pharmacy.

## 2018-11-15 DIAGNOSIS — K5901 Slow transit constipation: Secondary | ICD-10-CM | POA: Diagnosis not present

## 2018-11-15 DIAGNOSIS — Q423 Congenital absence, atresia and stenosis of anus without fistula: Secondary | ICD-10-CM | POA: Diagnosis not present

## 2018-11-21 ENCOUNTER — Telehealth: Payer: Self-pay | Admitting: Medical

## 2018-11-21 DIAGNOSIS — Z79899 Other long term (current) drug therapy: Secondary | ICD-10-CM

## 2018-11-21 NOTE — Telephone Encounter (Signed)
Pt scheduled lab visit 11/24/18

## 2018-11-21 NOTE — Telephone Encounter (Signed)
Called pt pt states he will call back

## 2018-11-21 NOTE — Telephone Encounter (Signed)
Lab appointment scheduled 11/24/18

## 2018-11-24 ENCOUNTER — Other Ambulatory Visit: Payer: Medicare Other

## 2018-11-29 DIAGNOSIS — K622 Anal prolapse: Secondary | ICD-10-CM | POA: Diagnosis not present

## 2018-11-30 ENCOUNTER — Other Ambulatory Visit: Payer: Medicare HMO

## 2018-11-30 ENCOUNTER — Other Ambulatory Visit: Payer: Self-pay

## 2018-11-30 ENCOUNTER — Telehealth: Payer: Self-pay | Admitting: *Deleted

## 2018-11-30 NOTE — Telephone Encounter (Signed)
Pt came to the office today and completed an Opioid treatment agreement and left. He did not stop in the lab and did not complete a UDS. Spoke with pt and he will return tomorrow morning at 7:30am. Appt scheduled.

## 2018-12-01 ENCOUNTER — Other Ambulatory Visit (INDEPENDENT_AMBULATORY_CARE_PROVIDER_SITE_OTHER): Payer: Medicare HMO

## 2018-12-01 DIAGNOSIS — Z79899 Other long term (current) drug therapy: Secondary | ICD-10-CM

## 2018-12-02 LAB — PAIN MGMT, PROFILE 8 W/CONF, U
6 Acetylmorphine: NEGATIVE ng/mL
Alcohol Metabolites: NEGATIVE ng/mL (ref <500)
Amphetamines: NEGATIVE ng/mL
Benzodiazepines: NEGATIVE ng/mL
Buprenorphine, Urine: NEGATIVE ng/mL
Cocaine Metabolite: NEGATIVE ng/mL
Creatinine: 56.1 mg/dL
MDMA: NEGATIVE ng/mL
Marijuana Metabolite: NEGATIVE ng/mL
Opiates: NEGATIVE ng/mL
Oxidant: NEGATIVE ug/mL
Oxycodone: NEGATIVE ng/mL
pH: 6.4 (ref 4.5–9.0)

## 2018-12-05 ENCOUNTER — Other Ambulatory Visit: Payer: Self-pay | Admitting: Medical

## 2018-12-05 DIAGNOSIS — M542 Cervicalgia: Secondary | ICD-10-CM

## 2018-12-05 MED ORDER — TRAMADOL HCL 50 MG PO TABS: ORAL_TABLET | ORAL | 0 refills | Status: DC

## 2018-12-05 NOTE — Telephone Encounter (Signed)
Rx refill of tramadol.

## 2018-12-05 NOTE — Telephone Encounter (Signed)
Medication:traMADol (ULTRAM) 50 MG tablet [[id number]   [facility] [address] [place] [phone] (Phone) [phone] (Fax)

## 2018-12-15 ENCOUNTER — Other Ambulatory Visit: Payer: Self-pay

## 2018-12-15 ENCOUNTER — Telehealth: Payer: Self-pay

## 2018-12-15 NOTE — Telephone Encounter (Signed)
Copied from [place] [id number]. Topic: General - Other >> Dec 15, 2018 10:04 AM [name] A wrote: Reason for CRM: Patient called to say that the pain in his shoulder is still there because the traMADol (ULTRAM) 50 MG tablet is not working and he need something stronger. Asking for a call back please at Ph# [phone]

## 2018-12-15 NOTE — Telephone Encounter (Signed)
Pt needs and appointment.

## 2018-12-16 ENCOUNTER — Ambulatory Visit (INDEPENDENT_AMBULATORY_CARE_PROVIDER_SITE_OTHER): Payer: Medicare HMO | Admitting: Medical

## 2018-12-16 ENCOUNTER — Encounter: Payer: Self-pay | Admitting: Medical

## 2018-12-16 VITALS — BP 120/70 | HR 60 | Temp 97.6°F | Resp 16 | Ht 67.0 in | Wt 113.6 lb

## 2018-12-16 DIAGNOSIS — M25512 Pain in left shoulder: Secondary | ICD-10-CM | POA: Diagnosis not present

## 2018-12-16 DIAGNOSIS — M25511 Pain in right shoulder: Secondary | ICD-10-CM | POA: Diagnosis not present

## 2018-12-16 DIAGNOSIS — M542 Cervicalgia: Secondary | ICD-10-CM

## 2018-12-16 DIAGNOSIS — K5900 Constipation, unspecified: Secondary | ICD-10-CM

## 2018-12-16 DIAGNOSIS — G8929 Other chronic pain: Secondary | ICD-10-CM

## 2018-12-16 MED ORDER — HYDROCODONE-ACETAMINOPHEN 5-325 MG PO TABS: ORAL_TABLET | ORAL | 0 refills | Status: DC

## 2018-12-16 NOTE — Patient Instructions (Signed)
Chronic neck and shoulder pains not responding to tramadol, I can give you a trial of 5-day course of low-dose Norco.  DC tramadol and advised not to use since adverse side effects/potential severe side effects could occur if used with Norco.  Norco is stronger pain medication but I do have some concern that it might cause severe constipation in light of your GI history.  So you might need full 1 tablet every 12 hours for severe pain.  However it might be a good idea just use 1/2 tablet every 12 hours.  If you do get constipated/no BM for 2 days straight then would recommend holding the Norco for 24 hours and using a Dulcolax.  Also I want you to ask your gastroenterologist if you can take diclofenac NSAID.  This might be beneficial use in the future on days you have constipation.   Follow-up in 2 to 3 weeks telephone or virtual visit or as needed.  Also if your shoulders are hurting worse going forward I could offer you sports medicine referral.

## 2018-12-16 NOTE — Progress Notes (Signed)
Subjective:    Patient ID: [name] N Mccathern, male    DOB: 11/10/1955, 63 y.o.   MRN: [id number]  HPI  Pt in for follow up for chronic neck pain and both shoulders. He states both area pain can be severe.  Pt had been on tramadol for a while but not strong enough.  Neck xray. IMPRESSION: 1.  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  2. 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 The cervical vertebrae are numbered accordingly. Mild retrolisthesis C4 on C5 of approximately 3 mm noted. Severe multilevel degenerative change. No evidence of fracture or dislocation.  Rt shoulder xray.  IMPRESSION: There is no acute or significant chronic bony abnormality of the right shoulder  Left shoulder xray.  IMPRESSION: There is no acute bony abnormality of the left shoulder. Mild narrowing of the subacromial subdeltoid space may be impacting the rotator cuff.  Pt thinks with his gi history if can take nsaids.  Pt currently having bm every other day.   Review of Systems  Constitutional: Negative for chills, fatigue and fever.  Respiratory: Negative for cough, chest tightness, shortness of breath and wheezing.   Cardiovascular: Negative for chest pain and palpitations.  Gastrointestinal: Negative for abdominal pain, diarrhea, nausea and vomiting.  Musculoskeletal: Positive for neck pain. Negative for back pain.       Shoulder pain.  Skin: Negative for rash.  Neurological: Negative for dizziness, weakness, numbness and headaches.  Hematological: Negative for adenopathy. Does not bruise/bleed easily.  Psychiatric/Behavioral: Negative for behavioral problems and confusion.    Past Medical History:  Diagnosis Date  . Anxiety   . Anxiety and depression 03/14/2010  . Colonic inertia    with chronic lifelong costipation  . Congenital fusion of cervical spine   . Depression   . FECAL INCONTINENCE 08/19/2007  . H/O: GI bleed   . Heart murmur    early childhood   . Hydrocephalus --per CT  04/2014 07/23/2014  . Idiopathic scoliosis   . 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 Pneumonia    childhood      Social History   Socioeconomic History  . Marital status: Single    Spouse name: Not on file  . Number of children: 0  . Years of education: Not on file  . Highest education level: Not on file  Occupational History  . 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 Financial resource strain: Not on file  . Food insecurity    Worry: Not on file    Inability: Not on file  . Transportation needs    Medical: Not on file    Non-medical: Not on file  Tobacco Use  . Smoking status: Never Smoker  . Smokeless tobacco: Never Used  Substance and Sexual Activity  . Alcohol use: No    Alcohol/week: 0.0 standard drinks  . Drug use: No  . Sexual activity: Yes  Lifestyle  . Physical activity    Days per week: Not on file    Minutes per session: Not on file  . Stress: Not on file  Relationships  . Social Musicianconnections    Talks on phone: Not on file    Gets together: Not on file    Attends religious service: Not on file    Active member of club or organization: Not on file    Attends meetings of clubs or organizations: Not on file    Relationship status: Not on file  . Intimate partner violence  Fear of current or ex partner: Not on file    Emotionally abused: Not on file    Physically abused: Not on file    Forced sexual activity: Not on file  Other Topics Concern  . Not on file  Social History Narrative   Lives w/ mother , has a younger brother and sister     Past Surgical History:  Procedure Laterality Date  . COLONOSCOPY  11/08/2006  . HARDWARE REMOVAL Right 10/23/2015   Procedure: RIGHT KNEE HARDWARE REMOVAL;  Surgeon: Gaynelle Arabian, MD;  Location: [facility];  Service: Orthopedics;  Laterality: Right;  LMA  . HERNIA REPAIR  1980   left inguinal hernia  . LAPAROTOMY  07/18/2011   Procedure: EXPLORATORY LAPAROTOMY;  Surgeon:  Jolly, MD;  Location: [facility];  Service: General;  Laterality: N/A;  lysis of adhesions entero enterostomy  . LAPAROTOMY  07/28/2011   Procedure: EXPLORATORY LAPAROTOMY;  Surgeon:  Jolly, MD;  Location: [facility];  Service: General;  Laterality: N/A;  . Left Knee surgery  1994, 1992  . ORIF PATELLA Right 12/14/2014   Procedure: OPEN REDUCTION INTERNAL (ORIF) FIXATION PATELLA;  Surgeon: Gaynelle Arabian, MD;  Location: [facility];  Service: Orthopedics;  Laterality: Right;  . Surgery for imperforate anus  1958  . TONSILLECTOMY    . UPPER GASTROINTESTINAL ENDOSCOPY  05/15/03    Family History  Problem Relation Age of Onset  . Alzheimer's disease Father   . Diabetes Mother   . Cancer Mother   . Colon cancer Neg Hx   . Prostate cancer Neg Hx     No Known Allergies  No current outpatient medications on file prior to visit.   No current facility-administered medications on file prior to visit.     BP 120/70   Pulse 60   Temp 97.6 F (36.4 C) (Oral)   Resp 16   Ht 5\' 7"  (1.702 m)   Wt 113 lb 9.6 oz (51.5 kg)   SpO2 100%   BMI 17.79 kg/m       Objective:   Physical Exam  General- No acute distress. Pleasant patient. Neck- Full range of motion, no jvd, mild  tenderness to palpation mid aspect. Lungs- Clear, even and unlabored. Heart- regular rate and rhythm. Neurologic- CNII- XII grossly intact. Left shoulder- pain on range of motion.(mild). more in are of trapezius.  Rt shoulder- mild pain over ac joint on palpation. Good rom. No crepitus.       Assessment & Plan:  Chronic neck and shoulder pains not responding to tramadol, I can give you a trial of 5-day course of low-dose Norco.  DC tramadol and advised not to use since adverse side effects/potential severe side effects could occur if used with Norco.  Norco is stronger pain medication but I do have some concern that it might cause severe constipation in light of your GI history.  So you might need full 1 tablet  every 12 hours for severe pain.  However it might be a good idea just use 1/2 tablet every 12 hours.  If you do get constipated/no BM for 2 days straight then would recommend holding the Norco for 24 hours and using a Dulcolax.  Also I want you to ask your gastroenterologist if you can take diclofenac NSAID.  This might be beneficial use in the future on days you have constipation.   Follow-up in 2 to 3 weeks telephone or virtual visit or as needed.  Also if your shoulders  are hurting worse going forward I could offer you sports medicine referral.  25 minutes spent with pt. 50% of time spent with pt counseling on plan going forward as explained above. Esperanza RichtersEdward Victormanuel Mclure, PA-C

## 2018-12-21 DIAGNOSIS — Q423 Congenital absence, atresia and stenosis of anus without fistula: Secondary | ICD-10-CM | POA: Diagnosis not present

## 2018-12-21 DIAGNOSIS — K622 Anal prolapse: Secondary | ICD-10-CM | POA: Diagnosis not present

## 2018-12-21 DIAGNOSIS — K5909 Other constipation: Secondary | ICD-10-CM | POA: Diagnosis not present

## 2018-12-29 DIAGNOSIS — K5900 Constipation, unspecified: Secondary | ICD-10-CM | POA: Diagnosis not present

## 2018-12-29 DIAGNOSIS — Z1211 Encounter for screening for malignant neoplasm of colon: Secondary | ICD-10-CM | POA: Diagnosis not present

## 2018-12-29 LAB — HM COLONOSCOPY

## 2019-01-09 ENCOUNTER — Telehealth: Payer: Self-pay | Admitting: Medical

## 2019-01-09 NOTE — Telephone Encounter (Signed)
Copied from [place] [id number]. Topic: Quick Communication - Rx Refill/Question >> Jan 09, 2019 10:44 AM Rainey Pines A wrote: Medication: traMADol (ULTRAM) 50 MG tablet (Patient has 4 pills left)  Has the patient contacted their pharmacy? Yes (Agent: If no, request that the patient contact the pharmacy for the refill.) (Agent: If yes, when and what did the pharmacy advise?)Contact PCP  Preferred Pharmacy (with phone number or street name): [facility] [address], [place] - [place] [phone] (Phone) [phone] (Fax)    Agent: Please be advised that RX refills may take up to 3 business days. We ask that you follow-up with your pharmacy.

## 2019-01-10 NOTE — Telephone Encounter (Signed)
Tramadol is no longer on pts current med list. Please advise.

## 2019-01-10 NOTE — Telephone Encounter (Signed)
Pt needs phone virtual visit. He told me tramadol was not helping with pain. I had given brief low dose norco to see if helped better but had concerns for constipation. Asked to follow up in 2-3 weeks. So please get him scheduled as need to know what to prescribe and need to document what is going on.

## 2019-01-11 ENCOUNTER — Ambulatory Visit (INDEPENDENT_AMBULATORY_CARE_PROVIDER_SITE_OTHER): Payer: Medicare HMO | Admitting: Medical

## 2019-01-11 ENCOUNTER — Other Ambulatory Visit: Payer: Self-pay

## 2019-01-11 DIAGNOSIS — M542 Cervicalgia: Secondary | ICD-10-CM | POA: Diagnosis not present

## 2019-01-11 MED ORDER — TRAMADOL HCL 50 MG PO TABS: ORAL_TABLET | ORAL | 0 refills | Status: DC

## 2019-01-11 NOTE — Patient Instructions (Signed)
I am glad to hear that that you think tramadol is adequate for your chronic neck pain.  I do think is a better option since Norco would probably tomorrow constipation in light of your GI history.  I went ahead and sent in prescription of tramadol and you can use 1 to 2 tablets every 8 hours as needed for severe pain.  I do think the way you you are using tramadol presently is reasonable.  You are up-to-date on UDS, controlled medication contract signed and I reviewed controlled medication site today.  For controlled medication visit can have you follow-up in 4 to 5 months or as needed.  Reminder to consider getting flu vaccine this year as strongly recommend this in light of current pandemic.

## 2019-01-11 NOTE — Progress Notes (Signed)
   Subjective:    Patient ID: [name], male    DOB: 1956/04/08, 63 y.o.   MRN: [id number]  HPI   Virtual Visit via Telephone Note  I connected with [name] on 01/11/19 at  3:40 PM EDT by telephone and verified that I am speaking with the correct person using two identifiers.  Location: Patient: home Provider: office  Pt did not check bp today. He has not bp cuff at home.   I discussed the limitations, risks, security and privacy concerns of performing an evaluation and management service by telephone and the availability of in person appointments. I also discussed with the patient that there may be a patient responsible charge related to this service. The patient expressed understanding and agreed to proceed.   History of Present Illness:  Pt states that he wants to use tramadol rather than norco for his severe chronic neck pain. For brief time he wanted to try norco but since last seen he states if takes tramadol 100 mg give adequate relief. Currently can take 2 tab in morning and 1 tab at night and pain adequate controlled.  Pt has hx of constipation but none recently. Tramadol does not constipate presently.     Observations/Objective: General- no acute distress, pleasant, oriented.  Assessment and Plan: I am glad to hear that that you think tramadol is adequate for your chronic neck pain.  I do think is a better option since Norco would probably tomorrow constipation in light of your GI history.  I went ahead and sent in prescription of tramadol and you can use 1 to 2 tablets every 8 hours as needed for severe pain.  I do think the way you you are using tramadol presently is reasonable.  You are up-to-date on UDS, controlled medication contract signed and I reviewed controlled medication site today.  For controlled medication visit can have you follow-up in 4 to 5 months or as needed.  Reminder to consider getting flu vaccine this year as strongly recommend  this in light of current pandemic.  Follow Up Instructions:    I discussed the assessment and treatment plan with the patient. The patient was provided an opportunity to ask questions and all were answered. The patient agreed with the plan and demonstrated an understanding of the instructions.   The patient was advised to call back or seek an in-person evaluation if the symptoms worsen or if the condition fails to improve as anticipated.  I provided 10 minutes of non-face-to-face time during this encounter.   Mackie Pai, PA-C    Review of Systems     Objective:   Physical Exam        Assessment & Plan:

## 2019-02-08 DIAGNOSIS — R627 Adult failure to thrive: Secondary | ICD-10-CM | POA: Diagnosis not present

## 2019-02-08 DIAGNOSIS — K622 Anal prolapse: Secondary | ICD-10-CM | POA: Diagnosis not present

## 2019-02-08 DIAGNOSIS — K5909 Other constipation: Secondary | ICD-10-CM | POA: Diagnosis not present

## 2019-02-08 DIAGNOSIS — Z681 Body mass index (BMI) 19 or less, adult: Secondary | ICD-10-CM | POA: Diagnosis not present

## 2019-02-08 DIAGNOSIS — Z8719 Personal history of other diseases of the digestive system: Secondary | ICD-10-CM | POA: Diagnosis not present

## 2019-02-08 DIAGNOSIS — R197 Diarrhea, unspecified: Secondary | ICD-10-CM | POA: Diagnosis not present

## 2019-02-08 DIAGNOSIS — K623 Rectal prolapse: Secondary | ICD-10-CM | POA: Diagnosis not present

## 2019-02-09 ENCOUNTER — Other Ambulatory Visit: Payer: Self-pay | Admitting: Medical

## 2019-02-09 MED ORDER — TRAMADOL HCL 50 MG PO TABS: ORAL_TABLET | ORAL | 0 refills | Status: DC

## 2019-02-09 NOTE — Telephone Encounter (Signed)
Copied from [place] [id number]. Topic: Quick Communication - Rx Refill/Question >> Feb 09, 2019  1:48 PM Rainey Pines A wrote: Medication: traMADol (ULTRAM) 50 MG tablet  Has the patient contacted their pharmacy? {Yes (Agent: If no, request that the patient contact the pharmacy for the refill.) (Agent: If yes, when and what did the pharmacy advise?)Contact PCP  Preferred Pharmacy (with phone number or street name): [facility] [address] [place] - [place] [phone] (Phone) [phone] (Fax)    Agent: Please be advised that RX refills may take up to 3 business days. We ask that you follow-up with your pharmacy.

## 2019-02-09 NOTE — Telephone Encounter (Signed)
Requested medication (s) are due for refill today: yes  Requested medication (s) are on the active medication list: yes  Last refill:  01/11/2019  Future visit scheduled: yes  Notes to clinic: refill cannot be delegated    Requested Prescriptions  Pending Prescriptions Disp Refills   traMADol (ULTRAM) 50 MG tablet 90 tablet 0    Sig: 1-2 tab po every 8 hours as needed for pain     Not Delegated - Analgesics:  Opioid Agonists Failed - 02/09/2019  1:50 PM      Failed - This refill cannot be delegated      Passed - Urine Drug Screen completed in last 360 days.      Passed - Valid encounter within last 6 months    Recent Outpatient Visits          4 weeks ago Neck pain   [facility] at [place], [place]   1 month ago Chronic pain of both shoulders   [facility]ivist at [place], [place]   5 months ago Other fatigue   [facility]ivist at [place], [place]   7 months ago Impacted cerumen of right Char[facility] fundraiser at [place], [place]   7 months ago Impacted cerumen of right Charity fundraiser at [place], [employer]            In 3 months Saguier, Percell Miller, PA-C [employer] at [employer], [place]

## 2019-02-13 NOTE — Telephone Encounter (Signed)
Refill Request: Tramadol  Last RX:02/09/19 Last OV:01/11/19 Next OV:06/02/19 UDS:12/01/18 CSC:12/01/18 CSR:

## 2019-02-13 NOTE — Telephone Encounter (Signed)
That refill looked like a duplicate.So declined refill of tramadol.

## 2019-02-24 ENCOUNTER — Telehealth: Payer: Self-pay | Admitting: Medical

## 2019-02-24 NOTE — Telephone Encounter (Signed)
Call patient regarding AWV, but no answer. Will try to call patient at a later time. SF

## 2019-03-09 ENCOUNTER — Telehealth: Payer: Self-pay | Admitting: Medical

## 2019-03-09 NOTE — Telephone Encounter (Signed)
Pt has history of GI condition that can actually lead to constipation. So need to discuss circumstance of diarrhea as don't want his chronic constipation get worse. Some patients can have constipation with leaking watery stool around blockage. So after talking with pt might get xray to evaluate amount of stool that may be present.

## 2019-03-09 NOTE — Telephone Encounter (Signed)
Pt asked to be prescribed a anti Diarrhea medication / please advise

## 2019-03-10 ENCOUNTER — Telehealth: Payer: Self-pay

## 2019-03-10 ENCOUNTER — Other Ambulatory Visit: Payer: Self-pay | Admitting: Medical

## 2019-03-10 ENCOUNTER — Telehealth: Payer: Self-pay | Admitting: Medical

## 2019-03-10 ENCOUNTER — Other Ambulatory Visit: Payer: Self-pay

## 2019-03-10 NOTE — Telephone Encounter (Signed)
Tired to reach pt. Pt vm is not set up will try again later.

## 2019-03-10 NOTE — Telephone Encounter (Signed)
Pt states he can't come today he is at work.  Pt states he needs something because the otc medication is not working. Pt states [name] knows his condition and he really needs something.

## 2019-03-10 NOTE — Telephone Encounter (Signed)
Tried to reach pt no answer and vm not set up

## 2019-03-10 NOTE — Telephone Encounter (Signed)
Pt returning call to office

## 2019-03-10 NOTE — Telephone Encounter (Signed)
Requested medication (s) are due for refill today: yes  Requested medication (s) are on the active medication list: yes  Last refill:  02/10/2019  Future visit scheduled: yes  Notes to clinic:  Refill cannot be delegated    Requested Prescriptions  Pending Prescriptions Disp Refills   traMADol (ULTRAM) 50 MG tablet 90 tablet 0    Sig: 1-2 tab po every 8 hours as needed for pain     Not Delegated - Analgesics:  Opioid Agonists Failed - 03/10/2019  8:24 AM      Failed - This refill cannot be delegated      Passed - Urine Drug Screen completed in last 360 days.      Passed - Valid encounter within last 6 months    Recent Outpatient Visits          1 month ago Neck pain   [facility] at [place], [place]   2 months ago Chronic pain of both shoulders   Archivist at [place], [place]   6 months ago Other fatigue   Archivist at [place], [place]   8 months ago Impacted cerumen of right [facility] fundraiser at [place], [place]   8 months ago Impacted cerumen of right Charity fundraiser at [place], [employer]            In 2 months Saguier, Percell Miller, PA-C [employer] at [employer], [facility]

## 2019-03-10 NOTE — Telephone Encounter (Signed)
Can he at least come in for xray abdomen to evaluate if constipated/severity. Important in light of his med history. We can write him note for his work explaining why he had to leave early.

## 2019-03-10 NOTE — Telephone Encounter (Signed)
Spoke with pt transferred call to [facility]

## 2019-03-10 NOTE — Telephone Encounter (Signed)
Copied from [place] [id number]. Topic: General - Inquiry >> Mar 10, 2019  1:14 PM Alease Frame wrote: Reason for CRM: Patient returned cal from office . Please advise  Call back number [phone]

## 2019-03-10 NOTE — Telephone Encounter (Signed)
Medication Refill - Medication: tramadol   Has the patient contacted their pharmacy? No. (Agent: If no, request that the patient contact the pharmacy for the refill.) (Agent: If yes, when and what did the pharmacy advise?)  Preferred Pharmacy (with phone number or street name):  [facility] [address], [place] - [place]  [place] [address]  Phone: [phone] Fax: [phone]  Not a 24 hour pharmacy; exact hours not known.     Agent: Please be advised that RX refills may take up to 3 business days. We ask that you follow-up with your pharmacy.

## 2019-03-10 NOTE — Telephone Encounter (Signed)
Can he at least go to radiology for xray of abdomen to assess amount of stool present. With his hx this would be helpful.  Then can advise on potential meds.

## 2019-03-11 MED ORDER — TRAMADOL HCL 50 MG PO TABS: ORAL_TABLET | ORAL | 0 refills | Status: DC

## 2019-03-11 NOTE — Telephone Encounter (Signed)
Refilled tramadol today.

## 2019-03-13 ENCOUNTER — Ambulatory Visit (HOSPITAL_BASED_OUTPATIENT_CLINIC_OR_DEPARTMENT_OTHER)
Admission: RE | Admit: 2019-03-13 | Discharge: 2019-03-13 | Disposition: A | Discharge status: Home / Self Care | Payer: Medicare HMO | Source: Ambulatory Visit | Attending: Medical | Admitting: Medical

## 2019-03-13 ENCOUNTER — Other Ambulatory Visit: Payer: Self-pay

## 2019-03-13 ENCOUNTER — Ambulatory Visit (INDEPENDENT_AMBULATORY_CARE_PROVIDER_SITE_OTHER): Payer: Medicare HMO | Admitting: Medical

## 2019-03-13 VITALS — BP 106/62 | HR 83 | Temp 97.3°F | Resp 12 | Ht 67.0 in | Wt 113.6 lb

## 2019-03-13 DIAGNOSIS — Z8719 Personal history of other diseases of the digestive system: Secondary | ICD-10-CM | POA: Diagnosis not present

## 2019-03-13 DIAGNOSIS — K5900 Constipation, unspecified: Secondary | ICD-10-CM | POA: Diagnosis not present

## 2019-03-13 NOTE — Progress Notes (Addendum)
Subjective:    Patient ID: [name], male    DOB: 04-08-1956, 63 y.o.   MRN: [id number]  HPI  Pt has history of some recent loose stools. But he states better than the other day.  Pt has saw general surgeon and recommended surgery.  [name] is a 63 y.o. male with history of imperforate anus and pull-through as a child with rectal prolapse. He was last seen in clinic in July. Colonoscopy was completed 12/29/2018 with poor prep, but no obvious lesions.   He has failure to thrive. He is very bothered by the prolapse. He also has diarrhea, inability to gain weight, low energy, and overwhelmed sense of self. He very much desires ileostomy.  The assessment and plan by surgeon was   Assessment  [name] is a 63 y.o. male with history of ileoanal pull-through who has ongoing difficulty with prolapse and agrees with proceeding with EUA/loop ileostomy creation.    Plan  - Plan for laparoscopic, possible open loop ileostomy with EUA on 02/24/2019 - consent obtained - Bundle initiated, no prep  Pt explains to me he backed out of surgery day or 2 before.   Pt has been to GI and they advised trulance at one point. Did not help.  Pt did have hx of bowel obstruction in 2015.   He states with hx of constipation. He will have some loose stools about 5 times a day chronically.    Review of Systems  Constitutional: Negative for chills, fatigue and fever.  Respiratory: Negative for cough, chest tightness, shortness of breath and wheezing.   Cardiovascular: Negative for chest pain and palpitations.  Gastrointestinal: Positive for constipation and diarrhea. Negative for abdominal distention, abdominal pain, nausea, rectal pain and vomiting.       Hx of constipation. Loose stools with this. See hpi and recent surgeon note.  Skin: Negative for rash.  Psychiatric/Behavioral: Negative for behavioral problems and decreased concentration.    Past Medical History:  Diagnosis  Date  . Anxiety   . Anxiety and depression 03/14/2010  . Colonic inertia    with chronic lifelong costipation  . Congenital fusion of cervical spine   . Depression   . FECAL INCONTINENCE 08/19/2007  . H/O: GI bleed   . Heart murmur    early childhood   . Hydrocephalus --per CT 04/2014 07/23/2014  . Idiopathic scoliosis   . 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 Pneumonia    childhood      Social History   Socioeconomic History  . Marital status: Single    Spouse name: Not on file  . Number of children: 0  . Years of education: Not on file  . Highest education level: Not on file  Occupational History  . 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 Financial resource strain: Not on file  . Food insecurity    Worry: Not on file    Inability: Not on file  . Transportation needs    Medical: Not on file    Non-medical: Not on file  Tobacco Use  . Smoking status: Never Smoker  . Smokeless tobacco: Never Used  Substance and Sexual Activity  . Alcohol use: No    Alcohol/week: 0.0 standard drinks  . Drug use: No  . Sexual activity: Yes  Lifestyle  . Physical activity    Days per week: Not on file    Minutes per session: Not on file  . Stress:  Not on file  Relationships  . Social Musicianconnections    Talks on phone: Not on file    Gets together: Not on file    Attends religious service: Not on file    Active member of club or organization: Not on file    Attends meetings of clubs or organizations: Not on file    Relationship status: Not on file  . Intimate partner violence    Fear of current or ex partner: Not on file    Emotionally abused: Not on file    Physically abused: Not on file    Forced sexual activity: Not on file  Other Topics Concern  . Not on file  Social History Narrative   Lives w/ mother , has a younger brother and sister     Past Surgical History:  Procedure Laterality Date  . COLONOSCOPY  11/08/2006  . HARDWARE REMOVAL Right  10/23/2015   Procedure: RIGHT KNEE HARDWARE REMOVAL;  Surgeon: Ollen GrossFrank Aluisio, MD;  Location: [facility];  Service: Orthopedics;  Laterality: Right;  LMA  . HERNIA REPAIR  1980   left inguinal hernia  . LAPAROTOMY  07/18/2011   Procedure: EXPLORATORY LAPAROTOMY;  Surgeon: Mariella SaaBenjamin T Hoxworth, MD;  Location: [facility];  Service: General;  Laterality: N/A;  lysis of adhesions entero enterostomy  . LAPAROTOMY  07/28/2011   Procedure: EXPLORATORY LAPAROTOMY;  Surgeon: Mariella SaaBenjamin T Hoxworth, MD;  Location: [facility];  Service: General;  Laterality: N/A;  . Left Knee surgery  1994, 1992  . ORIF PATELLA Right 12/14/2014   Procedure: OPEN REDUCTION INTERNAL (ORIF) FIXATION PATELLA;  Surgeon: Ollen GrossFrank Aluisio, MD;  Location: [facility];  Service: Orthopedics;  Laterality: Right;  . Surgery for imperforate anus  1958  . TONSILLECTOMY    . UPPER GASTROINTESTINAL ENDOSCOPY  05/15/03    Family History  Problem Relation Age of Onset  . Alzheimer's disease Father   . Diabetes Mother   . Cancer Mother   . Colon cancer Neg Hx   . Prostate cancer Neg Hx     No Known Allergies  Current Outpatient Medications on File Prior to Visit  Medication Sig Dispense Refill  . traMADol (ULTRAM) 50 MG tablet 1-2 tab po every 8 hours as needed for pain 90 tablet 0   No current facility-administered medications on file prior to visit.     BP 106/62 (BP Location: Right Arm, Cuff Size: Normal)   Pulse 83   Temp (!) 97.3 F (36.3 C) (Temporal)   Resp 12   Ht 5\' 7"  (1.702 m)   Wt 113 lb 9.6 oz (51.5 kg) Comment: with shoes  SpO2 100%   BMI 17.79 kg/m       Objective:   Physical Exam  General- No acute distress. Pleasant patient. Neck- Full range of motion, no jvd Lungs- Clear, even and unlabored. Heart- regular rate and rhythm. Neurologic- CNII- XII grossly intact.  Abdomen- soft, nt, nd, +bs, no rebound or guarding. Large scar present.  Back- no cva tenderenss.      Assessment & Plan:  For your history of chronic  constipation mixed with frequent loose stools will get cbc, cmp and 1 view abdomen. Your GI condition when you were younger and hx of bowel obstruction in 2015 caused me to be cautious. Will get cbc, cmp and 1 view abdomen xray.  If you have significant constipation on xray then would need to give treatment for this rather than loose stool. Certain meds can cause further constipation and we  don't want you to get obstructed. If signs of obstruction on xray may need ct.  You may need to have ileostomy to help resolve chronic gi conditions.   Follow up date to be determined after lab and imaging review.  25 minutes spent with pt. 50% of time spent counseling pt on plan going forward  Pt declined getting labs today but will get xray.

## 2019-03-13 NOTE — Patient Instructions (Addendum)
For your history of chronic constipation mixed with frequent loose stools will get cbc, cmp and 1 view abdomen. Your GI condition when you were younger and hx of bowel obstruction in 2015 caused me to be cautious. Will get cbc, cmp and 1 view abdomen xray.  If you have significant constipation on xray then would need to give treatment for this rather than loose stool. Certain meds can cause further constipation and we don't want you to get obstructed. If signs of obstruction on xray may need ct.  You may need to have ileostomy to help resolve chronic gi conditions.   Follow up date to be determined after lab and imaging review.

## 2019-04-04 ENCOUNTER — Telehealth: Payer: Self-pay

## 2019-04-04 NOTE — Telephone Encounter (Addendum)
Refill Request: Tramadol   Last RX:03/11/19 Last OV:03/13/19 Next OV:06/02/19 UDS:12/01/18 CSC:12/01/18 CSR:04/06/2019.  Refilled tramadol yesterday.

## 2019-04-04 NOTE — Telephone Encounter (Signed)
Copied from [place] [id number]. Topic: Quick Communication - Rx Refill/Question >> Apr 04, 2019 11:42 AM Carolyn Stare wrote: Medication traMADol Veatrice Bourbon) 50 MG tablet   Preferred [facility]   Agent: Please be advised that RX refills may take up to 3 business days. We ask that you follow-up with your pharmacy.

## 2019-04-05 ENCOUNTER — Other Ambulatory Visit: Payer: Self-pay | Admitting: Medical

## 2019-04-06 ENCOUNTER — Other Ambulatory Visit: Payer: Self-pay | Admitting: Medical

## 2019-04-06 MED ORDER — TRAMADOL HCL 50 MG PO TABS: ORAL_TABLET | ORAL | 0 refills | Status: DC

## 2019-04-06 NOTE — Telephone Encounter (Signed)
Medication: traMADol (ULTRAM) 50 MG tablet [[id number]]   Has the patient contacted their pharmacy? Yes  (Agent: If no, request that the patient contact the pharmacy for the refill.) (Agent: If yes, when and what did the pharmacy advise?)  Preferred Pharmacy (with phone number or street name): [facility] [address] [place] - [place] [phone] (Phone) [phone] (Fax)    Agent: Please be advised that RX refills may take up to 3 business days. We ask that you follow-up with your pharmacy.

## 2019-04-06 NOTE — Telephone Encounter (Signed)
Requested medication (s) are due for refill today: yes  Requested medication (s) are on the active medication list: yes  Last refill:  03/11/2019  Future visit scheduled: yes  Notes to clinic:  Refill cannot be delegated    Requested Prescriptions  Pending Prescriptions Disp Refills   traMADol (ULTRAM) 50 MG tablet 90 tablet 0    Sig: 1-2 tab po every 8 hours as needed for pain     Not Delegated - Analgesics:  Opioid Agonists Failed - 04/06/2019  1:04 PM      Failed - This refill cannot be delegated      Passed - Urine Drug Screen completed in last 360 days.      Passed - Valid encounter within last 6 months    Recent Outpatient Visits          3 weeks ago History of chronic constipation   [facility] at [place], [place]   2 months ago Neck pain   [facility]ivist at [place], [place]   3 months ago Chronic pain of both shoulders   Archivist at [place], [place]   7 months ago Other fatigue   Archivist at [place], [place]   9 months ago Impacted cerumen of right Charity fundraiser at [place], [employer]            In 1 month [place], Percell Miller, PA-C [employer] at [employer], [place]

## 2019-04-06 NOTE — Telephone Encounter (Signed)
Rx refill sent to pt pharmacy 

## 2019-04-26 ENCOUNTER — Other Ambulatory Visit: Payer: Self-pay | Admitting: Medical

## 2019-04-26 NOTE — Telephone Encounter (Addendum)
Refill Request: Last RX: Last OV:04/06/19 Next OV:03/13/19 UDS:12/01/18 CSC:12/01/18 CSR:04/27/2019

## 2019-04-27 ENCOUNTER — Other Ambulatory Visit: Payer: Self-pay | Admitting: Medical

## 2019-04-27 NOTE — Telephone Encounter (Signed)
Medication Refill - Medication:  traMADol (ULTRAM) 50 MG tablet [[id number]  Preferred Pharmacy (with phone number or street name):  [facility] [address] [place] - [place]  [place] [address]  Phone: [phone] Fax: [phone]     Agent: Please be advised that RX refills may take up to 3 business days. We ask that you follow-up with your pharmacy.

## 2019-05-09 ENCOUNTER — Ambulatory Visit: Payer: Medicare HMO | Admitting: Medical

## 2019-05-09 ENCOUNTER — Other Ambulatory Visit: Payer: Self-pay

## 2019-05-11 ENCOUNTER — Other Ambulatory Visit: Payer: Self-pay

## 2019-05-12 ENCOUNTER — Ambulatory Visit (HOSPITAL_BASED_OUTPATIENT_CLINIC_OR_DEPARTMENT_OTHER)
Admission: RE | Admit: 2019-05-12 | Discharge: 2019-05-12 | Disposition: A | Discharge status: Home / Self Care | Payer: Medicare HMO | Source: Ambulatory Visit | Attending: Medical | Admitting: Medical

## 2019-05-12 ENCOUNTER — Ambulatory Visit (INDEPENDENT_AMBULATORY_CARE_PROVIDER_SITE_OTHER): Payer: Medicare HMO | Admitting: Medical

## 2019-05-12 ENCOUNTER — Encounter: Payer: Self-pay | Admitting: Medical

## 2019-05-12 VITALS — BP 94/52 | HR 73 | Temp 96.7°F | Resp 12 | Ht 67.0 in | Wt 121.8 lb

## 2019-05-12 DIAGNOSIS — M25512 Pain in left shoulder: Secondary | ICD-10-CM

## 2019-05-12 DIAGNOSIS — G8929 Other chronic pain: Secondary | ICD-10-CM

## 2019-05-12 DIAGNOSIS — M542 Cervicalgia: Secondary | ICD-10-CM

## 2019-05-12 DIAGNOSIS — M25511 Pain in right shoulder: Secondary | ICD-10-CM

## 2019-05-12 MED ORDER — METHYLPREDNISOLONE ACETATE 40 MG/ML IJ SUSP
40.0000 mg | Freq: Once | INTRAMUSCULAR | Status: AC
  Administered 2019-05-12: 40 mg via INTRAMUSCULAR

## 2019-05-12 MED ORDER — PREDNISONE 10 MG PO TABS: ORAL_TABLET | ORAL | 0 refills | Status: DC

## 2019-05-12 NOTE — Patient Instructions (Addendum)
For your chronic neck pain and shoulder pain, we gave you depomedrol 40 mg im, 6 day tape dose prednisone, and continue tramadol.  Please get c-spine xray today. Will assess imaging and see if djd worse than before.  Will see how you respond to the above if fingers still numb or radicular symptoms then might need mri and referral to neurosurgeon.  Follow up 10 days or as needed

## 2019-05-12 NOTE — Progress Notes (Signed)
Subjective:    Patient ID: [name], male    DOB: [birth year], 63 y.o.   MRN: [id number]  HPI  Pt in with some bilateral shoulder and neck pain daily. Chronic neck pain in past and has known severe djd of neck skin.  Rt shoulder hurts more. Last xray of rt shoulder looked good.   Left shoulder xray in 07-2017 showed   IMPRESSION: There is no acute bony abnormality of the left shoulder. Mild narrowing of the subacromial subdeltoid space may be impacting the rotator cuff.  Pt has been on tramadol for severe pain in the above areas.     Review of Systems  Constitutional: Negative for chills, fatigue and fever.  Respiratory: Negative for apnea, cough, choking, shortness of breath and wheezing.   Cardiovascular: Negative for chest pain and palpitations.  Gastrointestinal: Negative for abdominal pain, constipation, diarrhea and nausea.  Musculoskeletal: Negative for back pain, neck pain and neck stiffness.  Skin: Negative for rash.  Neurological: Negative for dizziness, weakness, numbness and headaches.  Hematological: Negative for adenopathy. Does not bruise/bleed easily.  Psychiatric/Behavioral: Negative for behavioral problems, decreased concentration and suicidal ideas. The patient is not nervous/anxious and is not hyperactive.     Past Medical History:  Diagnosis Date  . Anxiety   . Anxiety and depression 03/14/2010  . Colonic inertia    with chronic lifelong costipation  . Congenital fusion of cervical spine   . Depression   . FECAL INCONTINENCE 08/19/2007  . H/O: GI bleed   . Heart murmur    early childhood   . Hydrocephalus --per CT 04/2014 07/23/2014  . Idiopathic scoliosis   . 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 Pneumonia    childhood      Social History   Socioeconomic History  . Marital status: Single    Spouse name: Not on file  . Number of children: 0  . Years of education: Not on file  . Highest education level: Not on file  Occupational History  .  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 Smoking status: Never Smoker  . Smokeless tobacco: Never Used  Substance and Sexual Activity  . Alcohol use: No    Alcohol/week: 0.0 standard drinks  . Drug use: No  . Sexual activity: Yes  Other Topics Concern  . Not on file  Social History Narrative   Lives w/ mother , has a younger brother and sister    Social Determinants of Health   Financial Resource Strain:   . Difficulty of Paying Living Expenses: Not on file  Food Insecurity:   . Worried About Charity fundraiser in the Last Year: Not on file  . Ran Out of Food in the Last Year: Not on file  Transportation Needs:   . Lack of Transportation (Medical): Not on file  . Lack of Transportation (Non-Medical): Not on file  Physical Activity:   . Days of Exercise per Week: Not on file  . Minutes of Exercise per Session: Not on file  Stress:   . Feeling of Stress : Not on file  Social Connections:   . Frequency of Communication with Friends and Family: Not on file  . Frequency of Social Gatherings with Friends and Family: Not on file  . Attends Religious Services: Not on file  . Active Member of Clubs or Organizations: Not on file  . Attends Archivist Meetings: Not on file  . Marital Status: Not on  file  Intimate Partner Violence:   . Fear of Current or Ex-Partner: Not on file  . Emotionally Abused: Not on file  . Physically Abused: Not on file  . Sexually Abused: Not on file    Past Surgical History:  Procedure Laterality Date  . COLONOSCOPY  11/08/2006  . HARDWARE REMOVAL Right 10/23/2015   Procedure: RIGHT KNEE HARDWARE REMOVAL;  Surgeon: Ollen Gross, MD;  Location: [facility];  Service: Orthopedics;  Laterality: Right;  LMA  . HERNIA REPAIR  1980   left inguinal hernia  . LAPAROTOMY  07/18/2011   Procedure: EXPLORATORY LAPAROTOMY;  Surgeon: Mariella Saa, MD;  Location: [facility];  Service: General;  Laterality: N/A;  lysis of  adhesions entero enterostomy  . LAPAROTOMY  07/28/2011   Procedure: EXPLORATORY LAPAROTOMY;  Surgeon: Mariella Saa, MD;  Location: [facility];  Service: General;  Laterality: N/A;  . Left Knee surgery  1994, 1992  . ORIF PATELLA Right 12/14/2014   Procedure: OPEN REDUCTION INTERNAL (ORIF) FIXATION PATELLA;  Surgeon: Ollen Gross, MD;  Location: [facility];  Service: Orthopedics;  Laterality: Right;  . Surgery for imperforate anus  1958  . TONSILLECTOMY    . UPPER GASTROINTESTINAL ENDOSCOPY  05/15/03    Family History  Problem Relation Age of Onset  . Alzheimer's disease Father   . Diabetes Mother   . Cancer Mother   . Colon cancer Neg Hx   . Prostate cancer Neg Hx     No Known Allergies  Current Outpatient Medications on File Prior to Visit  Medication Sig Dispense Refill  . traMADol (ULTRAM) 50 MG tablet TAKE ONE TO TWO TABLETS BY MOUTH EVERY 8 HOURS AS NEEDED FOR PAIN 90 tablet 0   No current facility-administered medications on file prior to visit.    BP (!) 94/52 (BP Location: Right Arm, Cuff Size: Normal)   Pulse 73   Temp (!) 96.7 F (35.9 C) (Temporal)   Resp 12   Ht 5\' 7"  (1.702 m)   Wt 121 lb 12.8 oz (55.2 kg)   SpO2 100%   BMI 19.08 kg/m       Objective:   Physical Exam  General Mental Status- Alert. General Appearance- Not in acute distress.   Skin General: Color- Normal Color. Moisture- Normal Moisture.  Neck Carotid Arteries- Normal color. Moisture- Normal Moisture. No carotid bruits. No JVD. Rt side neck pain on palpation.  Chest and Lung Exam Auscultation: Breath Sounds:-Normal.  Cardiovascular Auscultation:Rythm- Regular. Murmurs & Other Heart Sounds:Auscultation of the heart reveals- No Murmurs.  Abdomen Inspection:-Inspeection Normal. Palpation/Percussion:Note:No mass. Palpation and Percussion of the abdomen reveal- Non Tender, Non Distended + BS, no rebound or guarding.   Neurologic Cranial Nerve exam:- CN III-XII intact(No  nystagmus), symmetric smile. Strength:- 5/5 equal and symmetric strength both upper and lower extremities.  Shoulders- good range of motion. No crepitus on exam and rom. Rt upper ext- negative phalens sign. Sharp and dull discrimination intact on fingers.      Assessment & Plan:  For your chronic neck pain and shoulder pain, we gave you depomedrol 40 mg im, 6 day tape dose prednisone, and continue tramadol.  Please get c-spine xray today. Will assess imaging and see if djd worse than before.  Will see how you respond to the above if fingers still numb or radicular symptoms then might need mri and referral to neurosurgeon.  Follow up 10 days or as needed   25 minutes spent with pt. 50% spent counseling pt on  plan going forward.  Esperanza RichtersEdward Marthe Dant, PA-C

## 2019-05-16 ENCOUNTER — Telehealth: Payer: Self-pay | Admitting: *Deleted

## 2019-05-16 NOTE — Telephone Encounter (Signed)
-----   Message from Mackie Pai, PA-C sent at 05/12/2019  9:17 PM EST ----- Stable multilevel degenerative changes since prior study. Did  neck and shoulder pain respond to injection and prednisone?

## 2019-05-16 NOTE — Telephone Encounter (Signed)
Patient stated that the injection only worked a little not much.    Also that that he is now having numbness on his right last fingers now.

## 2019-05-17 ENCOUNTER — Telehealth: Payer: Self-pay | Admitting: Medical

## 2019-05-17 NOTE — Telephone Encounter (Signed)
Let me know if he agrees with referral to neurosurgeon.

## 2019-05-17 NOTE — Telephone Encounter (Signed)
With severe degenerative changes on xray and numbness of his fingers I want to refer him to neurosurgeon. If he agrees will refer. I think in light of xray and signs/symptoms best to be established with specialist.

## 2019-05-22 ENCOUNTER — Other Ambulatory Visit: Payer: Self-pay | Admitting: Medical

## 2019-05-22 NOTE — Telephone Encounter (Signed)
Rx tramadol sent to pt pharmacy. 

## 2019-05-23 NOTE — Telephone Encounter (Signed)
No answer/ no vm set up yet.

## 2019-05-24 NOTE — Telephone Encounter (Signed)
No answer no vm

## 2019-05-24 NOTE — Telephone Encounter (Signed)
No answer/ no vm set up 

## 2019-05-25 ENCOUNTER — Telehealth: Payer: Self-pay | Admitting: Medical

## 2019-05-25 DIAGNOSIS — M542 Cervicalgia: Secondary | ICD-10-CM

## 2019-05-25 NOTE — Telephone Encounter (Signed)
Referral to neurosurgeon placed. 

## 2019-05-25 NOTE — Telephone Encounter (Signed)
Patient ok with referral.  Please see other phone message to edward.

## 2019-05-25 NOTE — Telephone Encounter (Signed)
Patient agrees with neurosurgeon referral.

## 2019-05-25 NOTE — Telephone Encounter (Signed)
Are you going to put in order for referral?

## 2019-05-29 DIAGNOSIS — G959 Disease of spinal cord, unspecified: Secondary | ICD-10-CM | POA: Diagnosis not present

## 2019-06-02 ENCOUNTER — Ambulatory Visit: Payer: Medicare HMO | Admitting: Medical

## 2019-06-05 ENCOUNTER — Ambulatory Visit (INDEPENDENT_AMBULATORY_CARE_PROVIDER_SITE_OTHER): Payer: Medicare HMO | Admitting: Medical

## 2019-06-05 ENCOUNTER — Encounter: Payer: Self-pay | Admitting: Medical

## 2019-06-05 ENCOUNTER — Other Ambulatory Visit: Payer: Self-pay

## 2019-06-05 VITALS — BP 116/73 | HR 75 | Temp 96.8°F | Resp 16 | Ht 67.0 in | Wt 120.0 lb

## 2019-06-05 DIAGNOSIS — Z79899 Other long term (current) drug therapy: Secondary | ICD-10-CM | POA: Diagnosis not present

## 2019-06-05 NOTE — Progress Notes (Signed)
Subjective:    Patient ID: [name], male    DOB: [birth year], 64 y.o.   MRN: [id number]  HPI  Pt in for follow up.  Pt saw neurosurgeon office. I reviewed not this morning and it appears repeat mri needed. Pt had mri canceled since he had some Gi issues day of MRI. He had to go to bathroom and then his surgery was cancelled. Missed his mri last Thursday.  Pt states imaging would be done at neurosurgeon office.  Pt state his neck pain has been controlled with tramadol.  But he is having tingling and numbness in thumb, index and middle finger.(more in pad regions)  Pt on tramadol for pain. Contract was updated 12/20/2018. He gave uds today.    Review of Systems  Constitutional: Negative for appetite change and fatigue.  HENT: Negative for dental problem.   Respiratory: Negative for cough, chest tightness and wheezing.   Cardiovascular: Negative for chest pain and palpitations.  Gastrointestinal: Negative for abdominal pain.  Musculoskeletal: Negative for back pain.       Neck pain  Skin: Negative for rash.  Neurological: Negative for dizziness, speech difficulty, weakness, numbness and headaches.       See hpi.  Hematological: Negative for adenopathy. Does not bruise/bleed easily.  Psychiatric/Behavioral: Negative for behavioral problems, decreased concentration and dysphoric mood.    Past Medical History:  Diagnosis Date  . Anxiety   . Anxiety and depression 03/14/2010  . Colonic inertia    with chronic lifelong costipation  . Congenital fusion of cervical spine   . Depression   . FECAL INCONTINENCE 08/19/2007  . H/O: GI bleed   . Heart murmur    early childhood   . Hydrocephalus --per CT 04/2014 07/23/2014  . Idiopathic scoliosis   . 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 Pneumonia    childhood      Social History   Socioeconomic History  . Marital status: Single    Spouse name: Not on file  . Number of children: 0  . Years of education: Not on file  . Highest  education level: Not on file  Occupational History  . 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 Smoking status: Never Smoker  . Smokeless tobacco: Never Used  Substance and Sexual Activity  . Alcohol use: No    Alcohol/week: 0.0 standard drinks  . Drug use: No  . Sexual activity: Yes  Other Topics Concern  . Not on file  Social History Narrative   Lives w/ mother , has a younger brother and sister    Social Determinants of Health   Financial Resource Strain:   . Difficulty of Paying Living Expenses: Not on file  Food Insecurity:   . Worried About Programme researcher, broadcasting/film/video in the Last Year: Not on file  . Ran Out of Food in the Last Year: Not on file  Transportation Needs:   . Lack of Transportation (Medical): Not on file  . Lack of Transportation (Non-Medical): Not on file  Physical Activity:   . Days of Exercise per Week: Not on file  . Minutes of Exercise per Session: Not on file  Stress:   . Feeling of Stress : Not on file  Social Connections:   . Frequency of Communication with Friends and Family: Not on file  . Frequency of Social Gatherings with Friends and Family: Not on file  . Attends Religious Services: Not on file  . Active Member  of Clubs or Organizations: Not on file  . Attends Archivist Meetings: Not on file  . Marital Status: Not on file  Intimate Partner Violence:   . Fear of Current or Ex-Partner: Not on file  . Emotionally Abused: Not on file  . Physically Abused: Not on file  . Sexually Abused: Not on file    Past Surgical History:  Procedure Laterality Date  . COLONOSCOPY  11/08/2006  . HARDWARE REMOVAL Right 10/23/2015   Procedure: RIGHT KNEE HARDWARE REMOVAL;  Surgeon: Gaynelle Arabian, MD;  Location: [facility];  Service: Orthopedics;  Laterality: Right;  LMA  . HERNIA REPAIR  1980   left inguinal hernia  . LAPAROTOMY  07/18/2011   Procedure: EXPLORATORY LAPAROTOMY;  Surgeon: Cedrica Brune Jolly, MD;  Location:  [facility];  Service: General;  Laterality: N/A;  lysis of adhesions entero enterostomy  . LAPAROTOMY  07/28/2011   Procedure: EXPLORATORY LAPAROTOMY;  Surgeon: Kristee Angus Jolly, MD;  Location: [facility];  Service: General;  Laterality: N/A;  . Left Knee surgery  1994, 1992  . ORIF PATELLA Right 12/14/2014   Procedure: OPEN REDUCTION INTERNAL (ORIF) FIXATION PATELLA;  Surgeon: Gaynelle Arabian, MD;  Location: [facility];  Service: Orthopedics;  Laterality: Right;  . Surgery for imperforate anus  1958  . TONSILLECTOMY    . UPPER GASTROINTESTINAL ENDOSCOPY  05/15/03    Family History  Problem Relation Age of Onset  . Alzheimer's disease Father   . Diabetes Mother   . Cancer Mother   . Colon cancer Neg Hx   . Prostate cancer Neg Hx     No Known Allergies  Current Outpatient Medications on File Prior to Visit  Medication Sig Dispense Refill  . traMADol (ULTRAM) 50 MG tablet TAKE ONE TO TWO TABLETS BY MOUTH EVERY 8 HOURS AS NEEDED FOR PAIN 90 tablet 0   No current facility-administered medications on file prior to visit.    BP 116/73 (BP Location: Left Arm, Patient Position: Sitting, Cuff Size: Normal)   Pulse 75   Temp (!) 96.8 F (36 C) (Temporal)   Resp 16   Ht 5\' 7"  (1.702 m)   Wt 120 lb (54.4 kg)   SpO2 100%   BMI 18.79 kg/m       Objective:   Physical Exam  General- No acute distress. Pleasant patient. Neck- Full range of motion, no jvd Lungs- Clear, even and unlabored. Heart- regular rate and rhythm. Neurologic- CNII- XII grossly intact. Upper ext- grip strength is symmetric. Decreased sensation in rt thumb pad, rt index pad and middle pad per pt.      Assessment & Plan:  For your chronic neck pain with degenerative changes and fusion, you gave uds today and are up to date on contract.   I refilled your tramadol end of December.   Ask that you call and get MRI of c-spine rescheduled. From reports that I saw this morning I understand direction of your care will depend on  how results of the MRI.  Follow up in 4 months or as needed  20 minutes spent with pt. 50% of time spent counseling pt on plan going forward.  Mackie Pai, PA-C

## 2019-06-05 NOTE — Patient Instructions (Addendum)
For your chronic neck pain with degenerative changes and fusion, you gave uds today and are up to date on contract.   I refilled your tramadol end of December.   Ask that you call and get MRI of c-spine rescheduled. From reports that I saw this morning I understand direction of your care will depend on how results of the MRI.  Follow up in 4 months or as needed

## 2019-06-08 LAB — PAIN MGMT, PROFILE 8 W/CONF, U
6 Acetylmorphine: NEGATIVE ng/mL
Alcohol Metabolites: NEGATIVE ng/mL (ref <500)
Amphetamines: NEGATIVE ng/mL
Benzodiazepines: NEGATIVE ng/mL
Buprenorphine, Urine: NEGATIVE ng/mL
Cocaine Metabolite: NEGATIVE ng/mL
Codeine: NEGATIVE ng/mL
Creatinine: 118.7 mg/dL
Hydrocodone: NEGATIVE ng/mL
Hydromorphone: NEGATIVE ng/mL
MDMA: NEGATIVE ng/mL
Marijuana Metabolite: NEGATIVE ng/mL
Morphine: NEGATIVE ng/mL
Norhydrocodone: NEGATIVE ng/mL
Opiates: NEGATIVE ng/mL
Oxidant: NEGATIVE ug/mL
Oxycodone: NEGATIVE ng/mL
pH: 5.3 (ref 4.5–9.0)

## 2019-06-13 DIAGNOSIS — M542 Cervicalgia: Secondary | ICD-10-CM | POA: Diagnosis not present

## 2019-06-13 DIAGNOSIS — G959 Disease of spinal cord, unspecified: Secondary | ICD-10-CM | POA: Diagnosis not present

## 2019-06-17 ENCOUNTER — Other Ambulatory Visit: Payer: Self-pay | Admitting: Medical

## 2019-06-19 ENCOUNTER — Telehealth: Payer: Self-pay

## 2019-06-19 NOTE — Telephone Encounter (Addendum)
Requesting:Tramadol Contract:06/05/19 UDS:06/05/19 Last OV:06/05/19 Next OV:n/a Last Refill:05/22/19 #90 tab 0rf Database: Reviewed today.   Please advise   Refilled rx today. Sent in.

## 2019-06-19 NOTE — Telephone Encounter (Signed)
Patient called in to see if Dr. Billy Coast in his prescription today. It was suppose to be for  TRAMDOL 50 mg please give the patient a call to follow up with him at [phone] thanks.

## 2019-06-19 NOTE — Telephone Encounter (Signed)
Pt called about tramadol RX. He said that he and [name] discussed increase to 4x day at last visit. He is out of medication. I advised pt to allow 24-48 hours.  [name] [address] [place] - [address] Phone:  [phone]  Fax:  [phone]

## 2019-06-19 NOTE — Telephone Encounter (Signed)
Message already sent to provider for refill. Notified patient of this

## 2019-06-30 DIAGNOSIS — G959 Disease of spinal cord, unspecified: Secondary | ICD-10-CM | POA: Diagnosis not present

## 2019-06-30 DIAGNOSIS — M5412 Radiculopathy, cervical region: Secondary | ICD-10-CM | POA: Diagnosis not present

## 2019-07-03 ENCOUNTER — Other Ambulatory Visit (HOSPITAL_COMMUNITY): Payer: Self-pay | Admitting: Neurological Surgery

## 2019-07-03 ENCOUNTER — Other Ambulatory Visit: Payer: Self-pay | Admitting: Neurological Surgery

## 2019-07-03 DIAGNOSIS — G959 Disease of spinal cord, unspecified: Secondary | ICD-10-CM

## 2019-07-09 ENCOUNTER — Other Ambulatory Visit: Payer: Self-pay | Admitting: Medical

## 2019-07-10 NOTE — Telephone Encounter (Addendum)
Requesting: Tramadol Contract:12/01/2018 UDS:04/04/20 Last Visit:06/05/19 Next Visit: none Last Refill:06/19/19  Please Advise  About 10 days early for monthly refill. Did pharmacy send refill request by accident. Did pt go ahead and ask for early request?

## 2019-07-11 ENCOUNTER — Other Ambulatory Visit: Payer: Self-pay | Admitting: Medical

## 2019-07-12 MED ORDER — TRAMADOL HCL 50 MG PO TABS: ORAL_TABLET | ORAL | 0 refills | Status: DC

## 2019-07-12 NOTE — Telephone Encounter (Signed)
Requesting: Tramadol Contract:12/06/2018 UDS:12/01/2018 Last Visit:05/12/2019 Next Visit: none Last Refill:  Please Advise

## 2019-07-12 NOTE — Addendum Note (Signed)
Addended by: Maximino Sarin on: 07/12/2019 08:46 AM   Modules accepted: Orders

## 2019-07-12 NOTE — Addendum Note (Signed)
Addended by: Gwenevere Abbot on: 07/12/2019 02:58 PM   Modules accepted: Orders

## 2019-07-12 NOTE — Telephone Encounter (Signed)
Refill of tramadol sent to pt pharmacy. Note sig is 1-2 tab po tid prn pain.

## 2019-07-25 ENCOUNTER — Ambulatory Visit (HOSPITAL_COMMUNITY)
Admission: RE | Admit: 2019-07-25 | Discharge: 2019-07-25 | Disposition: A | Discharge status: Home / Self Care | Payer: Medicare HMO | Source: Ambulatory Visit | Attending: Neurological Surgery | Admitting: Neurological Surgery

## 2019-07-25 ENCOUNTER — Other Ambulatory Visit: Payer: Self-pay

## 2019-07-25 DIAGNOSIS — G959 Disease of spinal cord, unspecified: Secondary | ICD-10-CM | POA: Diagnosis not present

## 2019-07-25 DIAGNOSIS — Q761 Klippel-Feil syndrome: Secondary | ICD-10-CM | POA: Diagnosis not present

## 2019-07-25 DIAGNOSIS — M4802 Spinal stenosis, cervical region: Secondary | ICD-10-CM | POA: Diagnosis not present

## 2019-07-25 DIAGNOSIS — M5412 Radiculopathy, cervical region: Secondary | ICD-10-CM | POA: Diagnosis not present

## 2019-07-25 DIAGNOSIS — G992 Myelopathy in diseases classified elsewhere: Secondary | ICD-10-CM | POA: Diagnosis not present

## 2019-07-25 DIAGNOSIS — M4186 Other forms of scoliosis, lumbar region: Secondary | ICD-10-CM | POA: Diagnosis not present

## 2019-07-25 DIAGNOSIS — M542 Cervicalgia: Secondary | ICD-10-CM | POA: Diagnosis not present

## 2019-07-25 DIAGNOSIS — M5126 Other intervertebral disc displacement, lumbar region: Secondary | ICD-10-CM | POA: Diagnosis not present

## 2019-07-25 DIAGNOSIS — M4714 Other spondylosis with myelopathy, thoracic region: Secondary | ICD-10-CM | POA: Diagnosis not present

## 2019-07-25 MED ORDER — IOHEXOL 300 MG/ML  SOLN
10.0000 mL | Freq: Once | INTRAMUSCULAR | Status: AC | PRN
  Administered 2019-07-25: 7 mL via INTRATHECAL

## 2019-07-25 MED ORDER — HYDROCODONE-ACETAMINOPHEN 5-325 MG PO TABS: 1.0000 | ORAL_TABLET | ORAL | Status: DC | PRN

## 2019-07-25 MED ORDER — ONDANSETRON HCL 4 MG/2ML IJ SOLN: 4.0000 mg | Freq: Four times a day (QID) | INTRAMUSCULAR | Status: DC | PRN

## 2019-07-25 MED ORDER — HYDROCODONE-ACETAMINOPHEN 5-325 MG PO TABS
ORAL_TABLET | ORAL | Status: AC
  Administered 2019-07-25: 10:00:00 1 via ORAL
  Filled 2019-07-25: qty 1

## 2019-07-25 MED ORDER — DIAZEPAM 5 MG PO TABS
10.0000 mg | ORAL_TABLET | Freq: Once | ORAL | Status: AC
  Administered 2019-07-25: 10 mg via ORAL
  Filled 2019-07-25: qty 2

## 2019-07-25 MED ORDER — LIDOCAINE HCL (PF) 1 % IJ SOLN
5.0000 mL | Freq: Once | INTRAMUSCULAR | Status: AC
  Administered 2019-07-25: 1 mL

## 2019-07-25 NOTE — Discharge Instructions (Signed)
Myelogram  A myelogram is an imaging study of the spinal cord and the places where nerves attach to the spinal cord (nerve roots). A dye (contrast material) is injected into the spine before the X-ray. This provides a clearer image for your health care provider to see. You may need this study done if you have a spinal cord problem that cannot be diagnosed with other imaging studies, such as a CT scan or an MRI. You may also have this study to check your spine after surgery. Tell a health care provider about:  Any allergies you have, especially to iodine.  All medicines you are taking, including vitamins, herbs, eye drops, creams, and over-the-counter medicines.  Any problems you or family members have had with anesthetic medicines or contrast material.  Any blood disorders you have.  Any surgeries you have had.  Any medical conditions you have or have had, including asthma.  Whether you are pregnant or may be pregnant. What are the risks? Generally, this is a safe procedure. However, problems may occur, including:  Infection.  Bleeding.  Allergic reaction to medicines or dyes.  Damage to your spinal cord or nerves.  Loss or leaking of spinal fluid. This can lead to headaches.  Damage to kidneys.  Seizures. This is rare. What happens before the procedure?  Follow instructions from your health care provider about eating or drinking restrictions. You may be asked to drink more fluids.  Ask your health care provider about changing or stopping your regular medicines. This is especially important if you are taking diabetes medicines or blood thinners.  Plan to have someone take you home from the hospital or clinic.  If you will be going home right after the procedure, plan to have someone with you for 24 hours. What happens during the procedure?  You will lie face down on a table.  Your health care provider will locate the best injection site on your spine. This is most  often in the lower back.  The area of injection will be washed with soap.  You will be given a medicine to numb the area (local anesthetic).  Your health care provider will insert a long needle into the space around your spinal cord (subarachnoid space).  A sample of spinal fluid may be taken and sent to the lab for testing.  The contrast material will be injected into the subarachnoid space.  The exam table may be tilted to help the contrast material flow up or down your spine.  The X-ray will take images of your spinal cord for your health care provider to examine.  A bandage (dressing) may be placed over the injection site. The procedure may vary among health care providers and hospitals. What can I expect after this procedure?  Your blood pressure, heart rate, breathing rate, and blood oxygen level may be monitored until you leave the hospital or clinic.  You may have: ? Soreness on your injection site. ? A mild headache.  You will be asked to lie down with your head raised (elevated). This reduces the risk of a headache.  It is up to you to get the results of your procedure. Ask your health care provider, or the department that is doing the procedure, when your results will be ready. Follow these instructions at home:   Rest as told by your health care provider. Lie flat with your head slightly elevated to reduce the risk of a headache.  Do not bend, lift, or do hard work   for 24-48 hours, or as told by your health care provider.  Take over-the-counter and prescription medicines only as told by your health care provider.  Take care of your dressing as told by your health care provider.  Drink enough fluid to keep your urine pale yellow.  Bathe or shower as told by your health care provider. Contact a health care provider if:  You have a fever.  You have a headache that lasts longer than 24 hours.  You feel nauseous or vomit.  You have a stiff neck or numbness in  your legs.  You are unable to urinate or have a bowel movement.  You develop a rash, itching, or sneezing. Get help right away if:  You have new symptoms or your symptoms get worse.  You have a seizure.  You have trouble breathing. Summary  A myelogram is an imaging study of the spinal cord and the places where nerves attach to the spinal cord (nerve roots).  Before the procedure, follow instructions from your health care provider about changing or stopping your regular medicines, and eating and drinking restrictions.  After this procedure, you will be asked to lie down with your head raised (elevated). This reduces your risk of a headache.  Do not bend, lift, or do hard work for 24-48 hours, or as told by your health care provider.  Contact a health care provider if you have a stiff neck or numbness in your legs. Get help right away if symptoms get worse, or you have a seizure or trouble breathing. This information is not intended to replace advice given to you by your health care provider. Make sure you discuss any questions you have with your health care provider. Document Revised: 07/20/2018 Document Reviewed: 07/21/2018 Elsevier Patient Education  2020 [employer]  

## 2019-07-25 NOTE — Procedures (Signed)
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  We will also look at his lumbar spine for evaluation of that area.  Pre op Dx: Klippel-Feil syndrome, cervical myelopathy, lumbar spondylosis. 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

## 2019-07-26 DIAGNOSIS — M4802 Spinal stenosis, cervical region: Secondary | ICD-10-CM | POA: Diagnosis not present

## 2019-08-03 ENCOUNTER — Other Ambulatory Visit: Payer: Self-pay | Admitting: Medical

## 2019-08-04 NOTE — Telephone Encounter (Signed)
Requesting: tramadol Contract:12/06/18 UDS: 06/05/19 Last Visit:06/05/19 Next Visit:n/a Last Refill:07/12/19  Please Advise

## 2019-08-07 ENCOUNTER — Telehealth: Payer: Self-pay

## 2019-08-07 MED ORDER — TRAMADOL HCL 50 MG PO TABS: ORAL_TABLET | ORAL | 0 refills | Status: DC

## 2019-08-07 NOTE — Telephone Encounter (Signed)
Patient called in to see if Esperanza Richters could send in a proscription  for traMADol (ULTRAM) 50 MG tablet [[id number]]   Please send it to: [facility] [address] [place] - [address]  [address] [place], [place] [address]  Phone:  [phone] Fax:  [phone]  DEA #:  --

## 2019-08-07 NOTE — Telephone Encounter (Signed)
Rx tramadol refill sent to pt pharmacy. 

## 2019-08-07 NOTE — Telephone Encounter (Signed)
Script sent  

## 2019-09-19 ENCOUNTER — Ambulatory Visit (INDEPENDENT_AMBULATORY_CARE_PROVIDER_SITE_OTHER): Payer: Medicare HMO | Admitting: Medical

## 2019-09-19 ENCOUNTER — Other Ambulatory Visit: Payer: Self-pay

## 2019-09-19 VITALS — BP 118/75 | HR 73 | Temp 98.2°F | Resp 18 | Ht 66.0 in | Wt 114.0 lb

## 2019-09-19 DIAGNOSIS — L600 Ingrowing nail: Secondary | ICD-10-CM

## 2019-09-19 DIAGNOSIS — B351 Tinea unguium: Secondary | ICD-10-CM

## 2019-09-19 MED ORDER — CEPHALEXIN 500 MG PO CAPS: 500.0000 mg | ORAL_CAPSULE | Freq: Two times a day (BID) | ORAL | 0 refills | Status: DC

## 2019-09-19 NOTE — Progress Notes (Signed)
   Subjective:    Patient ID: [name], male    DOB: September 16, 1955, 64 y.o.   MRN: [id number]  HPI  Pt has rt great toe nail which he states is ingrown. Painful for about 6 months. Pt has not taken any medications or done any treatment.   Review of Systems  Respiratory: Negative for cough, chest tightness and wheezing.   Cardiovascular: Negative for chest pain and palpitations.  Skin:       See hpi.       Objective:   Physical Exam  General- No acute distress. Pleasant patient. Lungs- Clear, even and unlabored. Heart- regular rate and rhythm. Neurologic- CNII- XII grossly intact. Feet- very thick disfigured nails throughout. Left side lateral aspect tenderness to great toe.       Assessment & Plan:  For ingrown toenail with fungal infection of nail and early bacterial infection toe nail, I am referring to podiatrist as you may benefit from removal of entire nail. Recommend warm salt water soaks twice daily and start keflex.   Work excuse for today.   Follow up as needed.   Time spent with patient today was 15  minutes which consisted  discussing diagnosis, referral,  treatment and documentation.

## 2019-09-19 NOTE — Patient Instructions (Signed)
For ingrown toenail with fungal infection of nail and early bacterial infection toe nail, I am referring to podiatrist as you may benefit from removal of entire nail. Recommend warm salt water soaks twice daily and start keflex.   Work excuse for today.   Follow up as needed.

## 2019-09-20 ENCOUNTER — Ambulatory Visit: Payer: Medicare HMO | Admitting: Medical

## 2019-09-25 ENCOUNTER — Other Ambulatory Visit: Payer: Self-pay | Admitting: Medical

## 2019-09-25 ENCOUNTER — Telehealth: Payer: Self-pay

## 2019-09-25 NOTE — Telephone Encounter (Signed)
Patient called in to see if Esperanza Richters could send in a prescription for  cephALEXin (KEFLEX) 500 MG capsule [[id number]]    Please send it to [facility] [address] [place] - [address]  [address] [place], [place] [address]  Phone:  [phone] Fax:  [phone]  DEA #:  --

## 2019-09-25 NOTE — Telephone Encounter (Signed)
Requesting: tramadol  Contract:12/06/18 UDS:06/05/19 Last Visit: 09/19/19 Next Visit:n/a Last Refill:08/07/19  Please Advise

## 2019-09-26 NOTE — Telephone Encounter (Signed)
According to med list it appears already sent that in. So not sure why needs rx. I wrote 10 day rx?

## 2019-09-27 NOTE — Telephone Encounter (Signed)
Rx tramadol sent to pt pharmacy. 

## 2019-10-09 ENCOUNTER — Emergency Department (HOSPITAL_COMMUNITY)
Admission: EM | Admit: 2019-10-09 | Discharge: 2019-10-09 | Disposition: A | Discharge status: Home / Self Care | Payer: Medicare HMO | Attending: Emergency Medicine | Admitting: Emergency Medicine

## 2019-10-09 ENCOUNTER — Encounter (HOSPITAL_COMMUNITY): Payer: Self-pay

## 2019-10-09 ENCOUNTER — Telehealth: Payer: Self-pay | Admitting: Medical

## 2019-10-09 DIAGNOSIS — R42 Dizziness and giddiness: Secondary | ICD-10-CM | POA: Diagnosis not present

## 2019-10-09 DIAGNOSIS — R1111 Vomiting without nausea: Secondary | ICD-10-CM | POA: Diagnosis not present

## 2019-10-09 DIAGNOSIS — R1110 Vomiting, unspecified: Secondary | ICD-10-CM | POA: Insufficient documentation

## 2019-10-09 DIAGNOSIS — Z5321 Procedure and treatment not carried out due to patient leaving prior to being seen by health care provider: Secondary | ICD-10-CM | POA: Insufficient documentation

## 2019-10-09 DIAGNOSIS — R110 Nausea: Secondary | ICD-10-CM | POA: Diagnosis not present

## 2019-10-09 LAB — CBC
HCT: 42.8 % (ref 39.0–52.0)
Hemoglobin: 14 g/dL (ref 13.0–17.0)
MCH: 29 pg (ref 26.0–34.0)
MCHC: 32.7 g/dL (ref 30.0–36.0)
MCV: 88.8 fL (ref 80.0–100.0)
Platelets: 242 10*3/uL (ref 150–400)
RBC: 4.82 MIL/uL (ref 4.22–5.81)
RDW: 13.4 % (ref 11.5–15.5)
WBC: 5.9 10*3/uL (ref 4.0–10.5)
nRBC: 0 % (ref 0.0–0.2)

## 2019-10-09 LAB — BASIC METABOLIC PANEL
Anion gap: 11 (ref 5–15)
BUN: 7 mg/dL — ABNORMAL LOW (ref 8–23)
CO2: 24 mmol/L (ref 22–32)
Calcium: 9.1 mg/dL (ref 8.9–10.3)
Chloride: 103 mmol/L (ref 98–111)
Creatinine, Ser: 0.98 mg/dL (ref 0.61–1.24)
GFR calc Af Amer: >60 mL/min (ref >60)
GFR calc non Af Amer: >60 mL/min (ref >60)
Glucose, Bld: 132 mg/dL — ABNORMAL HIGH (ref 70–99)
Potassium: 3.8 mmol/L (ref 3.5–5.1)
Sodium: 138 mmol/L (ref 135–145)

## 2019-10-09 NOTE — ED Triage Notes (Signed)
Pt bib ems from dental office where he had a filling done, pt was sitting outside vomiting after the appointment. Reports dizziness when standing. Emesis x2 110/70 sitting, 120/82 lying HR66. Hands very pale and cold.

## 2019-10-09 NOTE — Telephone Encounter (Signed)
I could offer pt nicotine patches. If he wants.

## 2019-10-09 NOTE — ED Notes (Signed)
Called x3 for UA. Called by registration as well. No reply.

## 2019-10-09 NOTE — Telephone Encounter (Signed)
Patient called but unable to leave voicemail

## 2019-10-09 NOTE — ED Notes (Signed)
No answer x3

## 2019-10-09 NOTE — Telephone Encounter (Signed)
Patient states he would like some information on medication that helps quit smoking.  Please contact patient at .[phone]

## 2019-10-09 NOTE — ED Notes (Signed)
No answer x1 for vitals recheck 

## 2019-10-10 NOTE — Telephone Encounter (Signed)
Patient states he is looking for the best prescription medication to quit smoking

## 2019-10-10 NOTE — Telephone Encounter (Signed)
Would ask him to follow up to discuss options. I think nicotine  taper patches might be option for him. But other meds also but need to discuss other possibilities.

## 2019-10-11 NOTE — Telephone Encounter (Signed)
Patient states he needs recommendations for his sister because she is trying to quit smoking .

## 2019-10-12 ENCOUNTER — Ambulatory Visit (INDEPENDENT_AMBULATORY_CARE_PROVIDER_SITE_OTHER): Payer: Medicare HMO | Admitting: Podiatry

## 2019-10-12 ENCOUNTER — Encounter: Payer: Self-pay | Admitting: Podiatry

## 2019-10-12 ENCOUNTER — Other Ambulatory Visit: Payer: Self-pay

## 2019-10-12 VITALS — Temp 97.6°F

## 2019-10-12 DIAGNOSIS — B351 Tinea unguium: Secondary | ICD-10-CM | POA: Diagnosis not present

## 2019-10-12 DIAGNOSIS — M79675 Pain in left toe(s): Secondary | ICD-10-CM | POA: Diagnosis not present

## 2019-10-12 DIAGNOSIS — M79674 Pain in right toe(s): Secondary | ICD-10-CM

## 2019-10-12 DIAGNOSIS — L600 Ingrowing nail: Secondary | ICD-10-CM | POA: Diagnosis not present

## 2019-10-12 MED ORDER — NEOMYCIN-POLYMYXIN-HC 3.5-10000-1 OT SOLN
OTIC | 0 refills | Status: DC
Start: 2019-10-12 — End: 2020-10-02

## 2019-10-12 NOTE — Telephone Encounter (Signed)
Let pt know wellbutrin,chantix and nicotine patches are options. His sister should ask whoever she sees what they recommend as her medical history would narrow down choices. She not my pt so impossible to say which one is best for her.

## 2019-10-12 NOTE — Telephone Encounter (Signed)
Pt.notified

## 2019-10-12 NOTE — Progress Notes (Signed)
   Subjective:    Patient ID: [name], male    DOB: April 02, 1956, 64 y.o.   MRN: [id number]  HPI    Review of Systems  All other systems reviewed and are negative.      Objective:   Physical Exam        Assessment & Plan:

## 2019-10-12 NOTE — Patient Instructions (Signed)

## 2019-10-16 NOTE — Progress Notes (Signed)
Subjective:   Patient ID: [name], male   DOB: 64 y.o.   MRN: [id number]   HPI Patient presents with severe nail damage to the left big toenail that he states is sore and makes it hard for him to wear shoe gear comfortably and also has all nails that are thickened yellow with subungual debris.  Patient states that it is impossible to take care of any of his nails but the left big one is increasingly the most sore loose and he wants it removed.  Patient does not smoke and is active   Review of Systems  All other systems reviewed and are negative.       Objective:  Physical Exam Vitals and nursing note reviewed.  Constitutional:      Appearance: He is well-developed.  Pulmonary:     Effort: Pulmonary effort is normal.  Musculoskeletal:        General: Normal range of motion.  Skin:    General: Skin is warm.  Neurological:     Mental Status: He is alert.     Neurovascular status was found to be intact muscle strength was found to be adequate range of motion within normal limits.  Patient found to have a severely thickened dystrophic left big toenail that is painful when pressed and makes shoe gear difficult.  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  Recommended removal of the left hallux nail permanent and I allowed patient to read consent form for correction and patient understood consent and signed understanding risk and today I infiltrated the left hallux 60 mg Xylocaine Marcaine mixture remove the hallux nail exposed the matrix and applied phenol 5 applications 30 seconds followed by alcohol lavage and sterile dressing.  I then instructed on soaks and leave dressing on 24 hours but take it off  earlier if any drainage were to occur or pain.  I then debrided all nails and this will be done every 3 months with instructions given on further treatment

## 2019-10-20 ENCOUNTER — Telehealth: Payer: Self-pay | Admitting: Medical

## 2019-10-20 NOTE — Telephone Encounter (Signed)
Last tramadol RX:  09/27/19, #90 Last OV:  09/19/19, acute visit Next OV:  None scheduled UDS:  06/05/19 CSC:  06/11/17?

## 2019-10-21 NOTE — Telephone Encounter (Signed)
He is not due for refill until 10/28/2019. Also Nicki Guadalajara looked and indicated did not see updated controlled med contrsact. Will you get him to sign contact this week. Then I will send new tramadol rx next week.

## 2019-10-24 NOTE — Telephone Encounter (Signed)
Caller: Brynda Greathouse Call back phone number: [phone]  Patient states he takes 4 pills a day. He has no medicine left.   Please advise.

## 2019-10-24 NOTE — Telephone Encounter (Signed)
Patient called in to get a refill on Tramadol advised patient of previous note from [employer] on 10/21/2019 @ 11:06am  Per the patient he will come in this week to sign a controlled med contract.

## 2019-10-25 MED ORDER — TRAMADOL HCL 50 MG PO TABS: ORAL_TABLET | ORAL | 0 refills | Status: DC

## 2019-10-25 NOTE — Addendum Note (Signed)
Addended by: Gwenevere Abbot on: 10/25/2019 05:16 PM   Modules accepted: Orders

## 2019-10-25 NOTE — Telephone Encounter (Signed)
Refill tramadol sent to pt pharmacy.

## 2019-10-25 NOTE — Telephone Encounter (Signed)
Contracted updated.

## 2019-11-20 ENCOUNTER — Other Ambulatory Visit: Payer: Self-pay | Admitting: Medical

## 2019-11-20 NOTE — Telephone Encounter (Signed)
On review looks like his controlled medication contract for tramaol and his uds are just about to expire. Igf you could get him into sign contract and give uds then he will refill his tramadol.  If you would do that this week since I am out of town next week.

## 2019-11-21 MED ORDER — TRAMADOL HCL 50 MG PO TABS: ORAL_TABLET | ORAL | 0 refills | Status: DC

## 2019-11-21 NOTE — Telephone Encounter (Signed)
This is pt that I prescribe tramadol for chronic severe neck pain. Have referred to neurosurgeon Dr. Danielle Dess in past. Has not had surgery.   His Ct finding in past show.  2. 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 Mild spinal stenosis at both levels with up to mild spinal cord mass effect. Moderate to severe C6 neural foraminal stenosis suspected although better depicted on the January MRI.  3. C1 level spinal stenosis with up to mild cord mass effect, and other mild bilateral cervical foraminal stenosis, which are unrelated to disc disease.  He is up to date on contract and UDS.   He take tramadol 50 mg tid. Pain controlled.  Sending for review controlled med review.

## 2019-11-21 NOTE — Telephone Encounter (Signed)
Patient calling in regards to medication re-fill per pharmacy  provider declined re fill for :traMADol (ULTRAM) 50 MG tablet [[id number]     Please advise

## 2019-11-21 NOTE — Telephone Encounter (Signed)
Rx tramadol sent to pharmacy. He needs follow up for controlled med visit in august. Also saw he has contract for tramadol but it is labed as non opiod. Actually tramadol is opioid. Next contract update use opioid contract. FDA in past 3 years or so classified it as opiod.

## 2019-11-21 NOTE — Telephone Encounter (Signed)
Patient is up to date   Contract updated 10/31/2019  UDS 05/2019

## 2019-11-22 NOTE — Telephone Encounter (Signed)
Pt notified and appointment made

## 2019-11-22 NOTE — Telephone Encounter (Signed)
Agreed Patrick Wadleigh R Lowne Chase, DO  

## 2019-12-12 ENCOUNTER — Telehealth: Payer: Self-pay

## 2019-12-12 NOTE — Telephone Encounter (Signed)
Pt called requesting refill on tramadol. [facility]

## 2019-12-12 NOTE — Telephone Encounter (Signed)
I don't see updated controlled med contract. And it looks like no controlled med appointment in past 4 months. Want him to schedule appointment so contract can be updated.

## 2019-12-13 NOTE — Telephone Encounter (Signed)
Contract 10/31/19 & UDS 05/2019

## 2019-12-14 MED ORDER — TRAMADOL HCL 50 MG PO TABS: ORAL_TABLET | ORAL | 0 refills | Status: DC

## 2019-12-14 NOTE — Telephone Encounter (Signed)
12/25/19 - next visit

## 2019-12-14 NOTE — Telephone Encounter (Signed)
Thanks for checking on contract. However pt is about week to early for refill of tramadol Last refill 11-21-2019.  So he can fill presently   Did you get him scheduled within 1 month for controlled med visit?

## 2019-12-15 NOTE — Telephone Encounter (Signed)
Patient states pharmacy will not fill medication because is too early. Patient would like to speak to you in regards to his tramadol.

## 2019-12-18 NOTE — Telephone Encounter (Signed)
Did you want [facility] to fill script early or keep same fill date for the 29th

## 2019-12-18 NOTE — Telephone Encounter (Signed)
Patient states that he would like a call from the provider . In regards to medication and other questions that he has.

## 2019-12-18 NOTE — Telephone Encounter (Signed)
I want rx to be filled when due not early.

## 2019-12-25 ENCOUNTER — Ambulatory Visit: Payer: Medicare HMO | Admitting: Medical

## 2019-12-27 ENCOUNTER — Ambulatory Visit (INDEPENDENT_AMBULATORY_CARE_PROVIDER_SITE_OTHER): Payer: Medicare HMO | Admitting: Medical

## 2019-12-27 ENCOUNTER — Other Ambulatory Visit: Payer: Self-pay

## 2019-12-27 VITALS — BP 119/71 | HR 77 | Resp 18 | Ht 66.0 in | Wt 113.6 lb

## 2019-12-27 DIAGNOSIS — G8929 Other chronic pain: Secondary | ICD-10-CM

## 2019-12-27 DIAGNOSIS — M542 Cervicalgia: Secondary | ICD-10-CM | POA: Diagnosis not present

## 2019-12-27 DIAGNOSIS — M25511 Pain in right shoulder: Secondary | ICD-10-CM | POA: Diagnosis not present

## 2019-12-27 DIAGNOSIS — M25512 Pain in left shoulder: Secondary | ICD-10-CM | POA: Diagnosis not present

## 2019-12-27 DIAGNOSIS — Z79899 Other long term (current) drug therapy: Secondary | ICD-10-CM

## 2019-12-27 MED ORDER — TRAMADOL HCL 50 MG PO TABS: 50.0000 mg | ORAL_TABLET | Freq: Four times a day (QID) | ORAL | 0 refills | Status: DC | PRN

## 2019-12-27 MED ORDER — TRAMADOL HCL 50 MG PO TABS: 50.0000 mg | ORAL_TABLET | Freq: Four times a day (QID) | ORAL | 4 refills | Status: AC | PRN

## 2019-12-27 NOTE — Patient Instructions (Addendum)
Pt  chronic neck pain due to cervical spine abnormalities discussed above on MRI report.  Some pain associated in shoulders as well.  Pain level has been high recently and is requiring 2 tablets of tramadol every 12 hours.  Describes that he barely gets by with this but he is aware that other narcotics could be more constipating.  Presently he is reporting daily bowel movements with no constipation.  He is seeing GI and is following their recommendations.  He is up-to-date on UDS and contract.  That him to update contract today since I am giving higher volume volume and is going to use me pharmacy.   If he has any signs of severe constipation or obstruction then advised ED evaluation.  He is aware of recurrent treatment dilemma.  If his pain is exceeding current level then would refer him back to his neurosurgeon to discuss surgical option.  Follow-up in 4 months or as needed.

## 2019-12-27 NOTE — Progress Notes (Signed)
   Subjective:    Patient ID: [name], male    DOB: [birth year], 64 y.o.   MRN: [id number]  HPI  Pt in for follow up.  We discussed covid benefits vs risk.  Not getting and he states may want excemption  in future.   Pt still having neck pain. Pt is taking tramadol 2 tab po  twice a day.   Pt states pharmacy would not fill med when he needed. They thought he was filling early. I had advised to take 1 tab po tid.  Mri of c spine finding. 1. 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 C6 is virtually the only normally formed cervical vertebrae (although demonstrates spina bifida occulta). No acute osseous abnormality in the spine.  2. 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 Mild spinal stenosis at both levels with up to mild spinal cord mass effect. Moderate to severe C6 neural foraminal stenosis suspected although better depicted on the January MRI  . C1 level spinal stenosis with up to mild cord mass effect, and other mild bilateral cervical foraminal stenosis, which are unrelated to disc disease.   Pt was using tramadol 2 tab po bid since pain severe. I had written for him to use tid. 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 He had surgery in past.  Patient is aware of constipating side effect of tramadol.      Review of Systems     Objective:   Physical Exam  General- No acute distress. Pleasant patient. Neck-mild mid C-spine tenderness to palpation.  Good range of motion but moves neck from rigidity. Lungs- Clear, even and unlabored. Heart- regular rate and rhythm. Neurologic- CNII- XII grossly intact. Abdomen-soft, nontender, nondistended, positive bowel sounds no rebound or guarding.      Assessment & Plan:  Pt has chronic neck pain due to cervical spine abnormalities  discussed above on MRI report.  Some pain associated in shoulders as well.  Pain level has been high recently and is requiring 2 tablets of tramadol every 12 hours.  Describes that he barely gets by with this but he is aware that other narcotics could be more constipating.  Presently he is reporting daily bowel movements with no constipation.  He is seeing GI and is following their recommendations.  He is up-to-date on UDS and contract.  That him to update contract today since I am giving higher volume volume and is going to use me pharmacy.   If he has any signs of severe constipation or obstruction then advised ED evaluation.  He is aware of recurrent treatment dilemma.  If his pain is exceeding current level then would refer him back to his neurosurgeon to discuss surgical option.  Follow-up in 4 months or as needed.  Time spent with patient today was 34  minutes which consisted of chart review, discussing diagnosis,  Treatment, answering questions/discussing benefit versus risk of increased dosing of tramadol, and documentation.

## 2020-01-04 ENCOUNTER — Telehealth: Payer: Self-pay | Admitting: Medical

## 2020-01-04 NOTE — Telephone Encounter (Signed)
CallerAldan Singh Call back # [phone]  Patient states he needs an exemption covid letter address to the  [facility]. Patient states he would like a letter to be mailed to him.

## 2020-01-05 NOTE — Telephone Encounter (Signed)
Had question on patients who request exception letter for covid vaccine. I remember email which essentially stated reaction to vaccine or components the  only reason? Was that your understanding?  I was also wondering about religious objections/excemptions?

## 2020-01-05 NOTE — Telephone Encounter (Signed)
My understanding was reaction to first vaccine,   allergy to components ,  scientology or Luan Moore witness--- only---- or severe allergy with anaphylaxis

## 2020-01-09 ENCOUNTER — Other Ambulatory Visit: Payer: Self-pay | Admitting: Medical

## 2020-01-09 NOTE — Telephone Encounter (Signed)
In epic I rx'd tramadol on 12-27-2019. With refills? Why did I get a request to refill. Did pharmacy refill. Is med on hold?

## 2020-01-09 NOTE — Telephone Encounter (Signed)
I don't see that he has medical reason for exemption. Does he have religous reason for not wanting?  I have not issued any exemption and not saying I will give one but does he have religous reason for not wanting?

## 2020-01-10 ENCOUNTER — Other Ambulatory Visit: Payer: Self-pay | Admitting: Medical

## 2020-01-10 NOTE — Telephone Encounter (Signed)
[name] is requesting script but it was last filled there on 7/24 , but epic states he got it filled at the [facility] on 8/4

## 2020-01-10 NOTE — Telephone Encounter (Signed)
Patient states he has religious beliefs to not wanting the vaccine

## 2020-01-10 NOTE — Telephone Encounter (Signed)
Called pt and unable to lvm 

## 2020-01-11 MED FILL — traMADol HCL 50 MG TABS: 50 | 30 days supply | Qty: 120 | Fill #0

## 2020-01-11 NOTE — Telephone Encounter (Signed)
Let pt know that below are the exception per supervising MD.  Reaction to first vaccine,   allergy to components ,  scientology or Luan Moore witness--- only---- or severe allergy with anaphylaxis.  He does not have anaphylactic history, allergy history, never had vaccine. So medical wise don't see reason for exemption.  Religious exemptions for Jehovah witness or scientologist only.   If he  is neither then would ask him to talk with his employer as to what their religious excemption policy they have. Talk with them directly if he has other belief. But I can only write for above. If employer excepts others then maybe his pastor/spiritual leader could write letter.  If he has concerns about mrna vaccines then he can avoid phizer and moderna.  Laural Benes and Laural Benes is based on IT sales professional and is only one shot/injection. That is other option. If he goes that route then have him make sure employer except [employer].  Also let him know the tramadol rx is at [facility].

## 2020-01-11 NOTE — Telephone Encounter (Signed)
Patient called stating that he needs a refill .   Medication: traMADol (ULTRAM) 50 MG tablet [[id number]] DISCONTINUED   Has the patient contacted their pharmacy? No. (If no, request that the patient contact the pharmacy for the refill.) (If yes, when and what did the pharmacy advise?)  Preferred Pharmacy (with phone number or street name): [facility] [address], [place] - [address] Phone:  [phone]  Fax:  [phone]       Agent: Please be advised that RX refills may take up to 3 business days. We ask that you follow-up with your pharmacy.

## 2020-01-11 NOTE — Telephone Encounter (Signed)
Notify pt rx at [facility]. If he wants me to send to other then need to cancel out rx at [facility] before sending to other pharmacy.

## 2020-01-11 NOTE — Telephone Encounter (Signed)
Spoke with pharmacy at [facility] , patient has not picked up script from 8th but its on file downstairs.

## 2020-01-12 ENCOUNTER — Ambulatory Visit: Payer: Medicare Other | Admitting: Podiatry

## 2020-01-12 NOTE — Telephone Encounter (Signed)
Told patient the note that was wrote about no vaccine record or allergy reaction and then asked if he was a [place] witness or believed in scientology and he said " nevermind" and hung up in my face.

## 2020-02-12 MED FILL — traMADol HCL 50 MG TABS: 50 | 30 days supply | Qty: 120 | Fill #1

## 2020-03-04 IMAGING — CR ABDOMEN - 1 VIEW
1 series · 1 of 1 positions shown · non-contrast
Comparison: CT, 08/20/2018

CLINICAL DATA: Pt was constipated last week and then has had
diarrhea this week. Pt hx of bowel obstruction, abdominal sx to
repair.

EXAM:
ABDOMEN - 1 VIEW

[t abdomen supine]
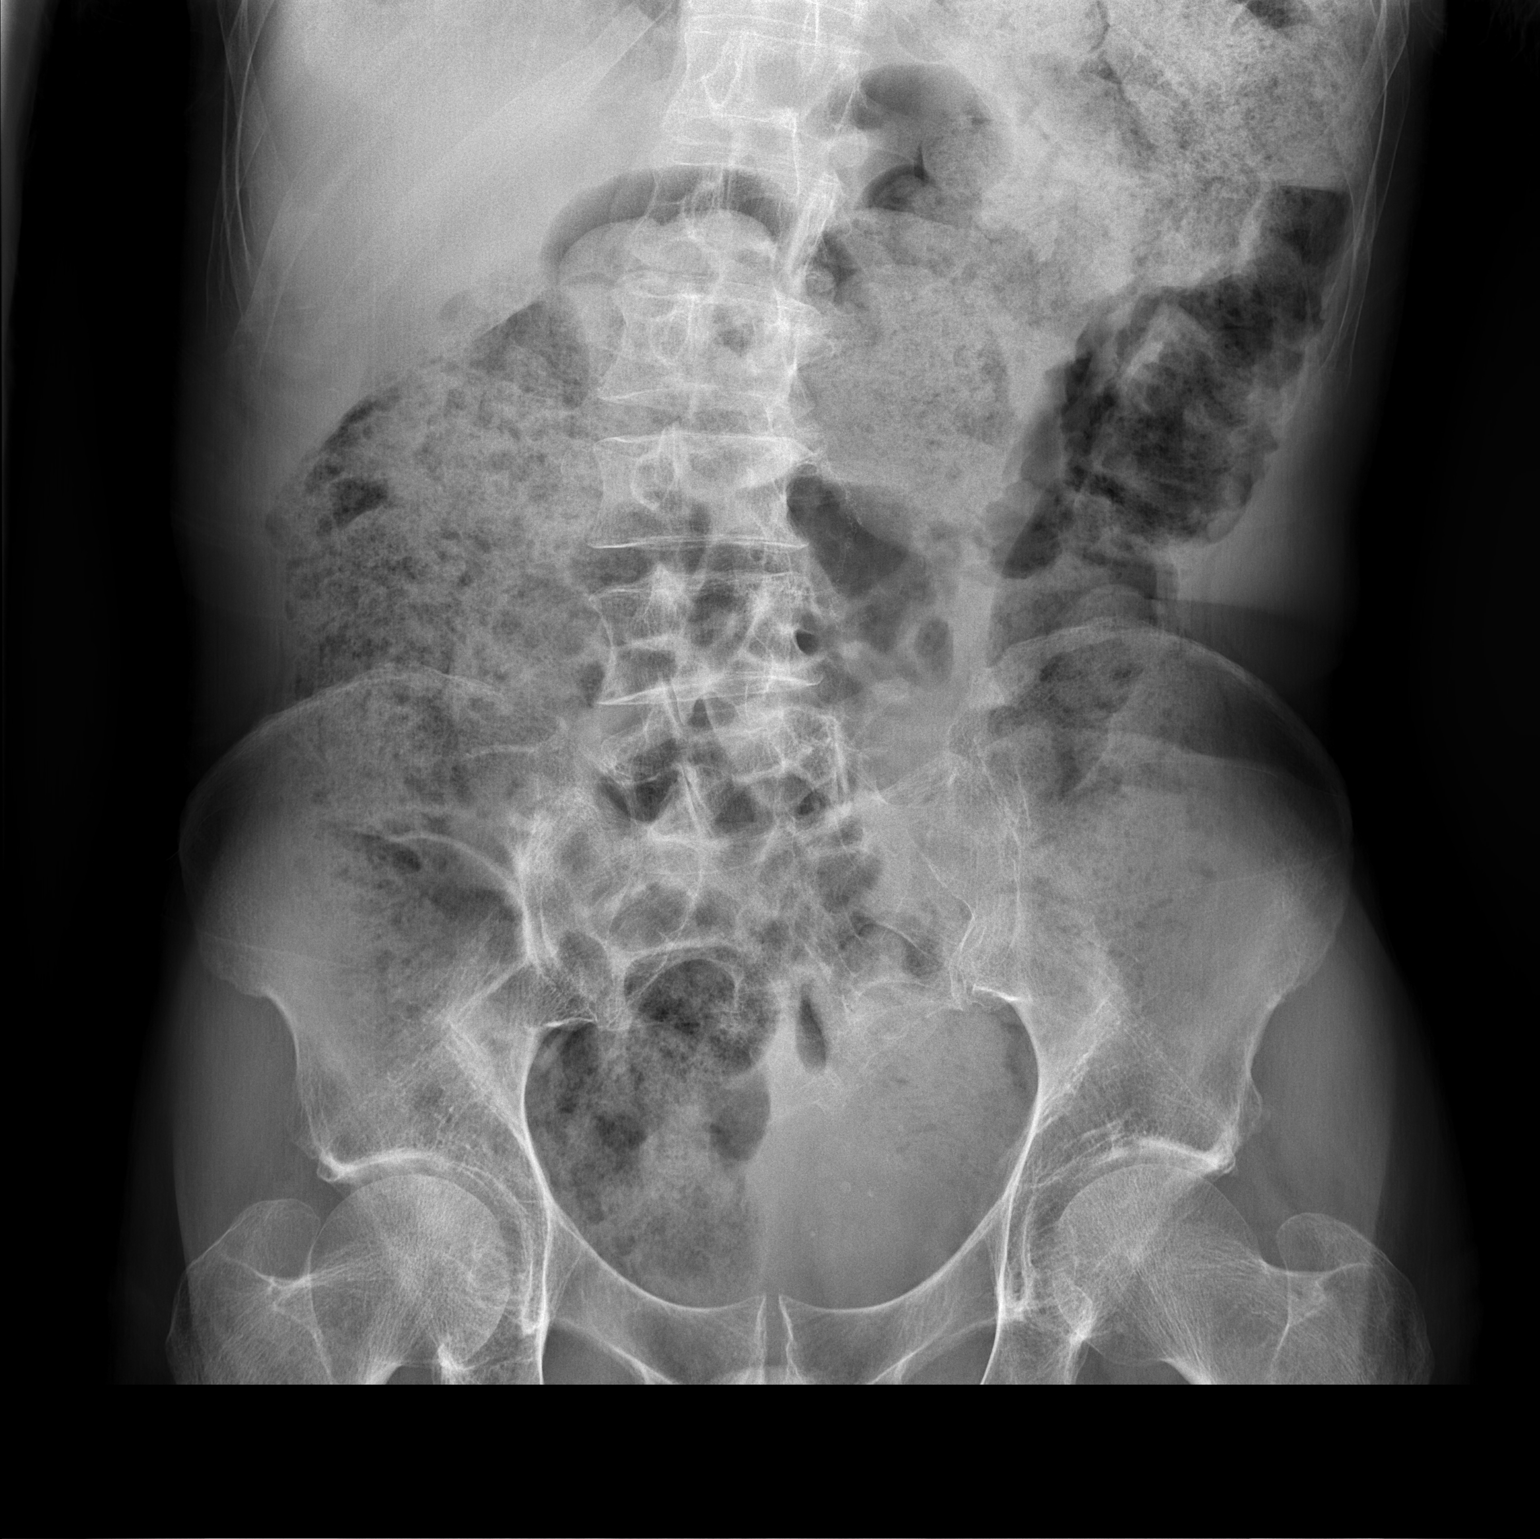

[1 of 1 positions shown; findings below may reference images not displayed]

FINDINGS: Moderate-to-marked increased stool noted throughout the colon
similar to the prior CT. No bowel dilation is seen to suggest
obstruction.

No gross soft tissue abnormality.

Mild dextroscoliosis of the lumbar spine, stable.
IMPRESSION: 1. Constipation with moderate to marked increased stool noted
throughout the colon similar to the prior CT scan. No findings of
bowel obstruction.

## 2020-03-14 MED FILL — traMADol HCL 50 MG TABS: 50 | 30 days supply | Qty: 120 | Fill #2

## 2020-04-03 ENCOUNTER — Ambulatory Visit (INDEPENDENT_AMBULATORY_CARE_PROVIDER_SITE_OTHER): Payer: Medicare HMO | Admitting: Podiatry

## 2020-04-03 ENCOUNTER — Encounter: Payer: Self-pay | Admitting: Podiatry

## 2020-04-03 ENCOUNTER — Other Ambulatory Visit: Payer: Self-pay

## 2020-04-03 DIAGNOSIS — M79674 Pain in right toe(s): Secondary | ICD-10-CM

## 2020-04-03 DIAGNOSIS — B351 Tinea unguium: Secondary | ICD-10-CM

## 2020-04-03 DIAGNOSIS — M79675 Pain in left toe(s): Secondary | ICD-10-CM | POA: Diagnosis not present

## 2020-04-03 NOTE — Progress Notes (Signed)
This patient returns to the office for evaluation and treatment of long thick painful nails .  This patient is unable to trim his own nails since the patient cannot reach his feet.  Patient says the nails are painful walking and wearing his shoes.  Patient had great toenail left foot removed.  He returns for preventive foot care services.  General Appearance  Alert, conversant and in no acute stress.  Vascular  Dorsalis pedis and posterior tibial  pulses are palpable  bilaterally.  Capillary return is within normal limits  bilaterally. Temperature is within normal limits  bilaterally.  Neurologic  Senn-Weinstein monofilament wire test within normal limits  bilaterally. Muscle power within normal limits bilaterally.  Nails Thick disfigured discolored nails with subungual debris  from hallux to fifth toes bilaterally. No evidence of bacterial infection or drainage bilaterally.  Great toenail left hallux surgically removed.  Orthopedic  No limitations of motion  feet .  No crepitus or effusions noted.  No bony pathology or digital deformities noted.  DJD 1st MPJ  B/L.  Skin  normotropic skin with no porokeratosis noted bilaterally.  No signs of infections or ulcers noted.     Onychomycosis  Pain in toes right foot  Pain in toes left foot  Debridement  of nails  1-5  B/L with a nail nipper.  Nails were then filed using a dremel tool with no incidents.    RTC  4 months    Helane Gunther DPM

## 2020-04-17 MED FILL — traMADol HCL 50 MG TABS: 50 | 30 days supply | Qty: 120 | Fill #3

## 2020-04-22 ENCOUNTER — Telehealth: Payer: Self-pay | Admitting: Medical

## 2020-04-22 NOTE — Telephone Encounter (Signed)
Pt needs to make appointment for follow up. Need to discuss options. His history and cervical spine issues are complicated. Also has issues with constipation for which pain med choices restricted.  Need to have office visit.

## 2020-04-22 NOTE — Telephone Encounter (Signed)
Patient states tramadol is not really working for his pain. Patient states his pain is worse and he would like a different type of medication if possible send to [facility] [address], [place] - [address]  [address] [place], [place] [address]  Phone:  [phone] Fax:  [phone]

## 2020-04-23 NOTE — Telephone Encounter (Signed)
Pt states he will call back to schedule an appointment 

## 2020-05-06 ENCOUNTER — Telehealth: Payer: Self-pay | Admitting: Medical

## 2020-05-06 NOTE — Telephone Encounter (Signed)
Call back [phone] (sister #)  Patient would like the information of the GI doctor he saw sometime last year

## 2020-05-07 ENCOUNTER — Ambulatory Visit (INDEPENDENT_AMBULATORY_CARE_PROVIDER_SITE_OTHER): Payer: Medicare HMO | Admitting: Medical

## 2020-05-07 ENCOUNTER — Ambulatory Visit (HOSPITAL_BASED_OUTPATIENT_CLINIC_OR_DEPARTMENT_OTHER)
Admission: RE | Admit: 2020-05-07 | Discharge: 2020-05-07 | Disposition: A | Discharge status: Home / Self Care | Payer: Medicare HMO | Source: Ambulatory Visit | Attending: Medical | Admitting: Medical

## 2020-05-07 ENCOUNTER — Other Ambulatory Visit: Payer: Self-pay

## 2020-05-07 VITALS — BP 104/61 | HR 65 | Temp 98.2°F | Resp 16 | Ht 66.0 in | Wt 111.8 lb

## 2020-05-07 DIAGNOSIS — Z8719 Personal history of other diseases of the digestive system: Secondary | ICD-10-CM | POA: Diagnosis not present

## 2020-05-07 DIAGNOSIS — R195 Other fecal abnormalities: Secondary | ICD-10-CM | POA: Diagnosis not present

## 2020-05-07 DIAGNOSIS — K529 Noninfective gastroenteritis and colitis, unspecified: Secondary | ICD-10-CM | POA: Diagnosis not present

## 2020-05-07 DIAGNOSIS — M542 Cervicalgia: Secondary | ICD-10-CM

## 2020-05-07 DIAGNOSIS — Z79899 Other long term (current) drug therapy: Secondary | ICD-10-CM

## 2020-05-07 MED ORDER — TRAMADOL HCL 50 MG PO TABS: 50.0000 mg | ORAL_TABLET | Freq: Four times a day (QID) | ORAL | 0 refills | Status: DC | PRN

## 2020-05-07 NOTE — Progress Notes (Addendum)
Subjective:    Patient ID: [name], male    DOB: May 04, 1956, 64 y.o.   MRN: [id number]  HPI  Pt in for follow up.  Pt state he is having chronic loose stools. But he also has history of chronic constipation followed by loose stools.    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 He was last seen in clinic in July. Colonoscopy was completed 12/29/2018 with poor prep, but no obvious lesions.   He has failure to thrive. He is very bothered by the prolapse. He also has diarrhea, inability to gain weight, low energy, and overwhelmed sense of self. He very much desires ileostomy."  Pt states he was referred then backed out of the procedure.  He wants to be referred now to [facility] specialist.   Prior GI Plan  - Plan for laparoscopic, possible open loop ileostomy with EUA on 02/24/2019 - consent obtained - Bundle initiated, no prep - COVID ordered. Advised to get this at [facility] facility 1 week prior to OR [facility] visit requested    Pt has severe neck pain related to below. IMPRESSION: 1. 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 C6 is virtually the only normally formed cervical vertebrae (although demonstrates spina bifida occulta). No acute osseous abnormality in the spine.  2. 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 Mild spinal stenosis at both levels with up to mild spinal cord mass effect. Moderate to severe C6 neural foraminal stenosis suspected although better depicted on the January MRI.  3. C1 level spinal stenosis with up to mild cord mass effect, and other mild bilateral cervical foraminal stenosis, which are unrelated to disc disease.  In the past opted for tramadol for pain. Have avoid stronger narcotics as not to contipation or possible cause  obstruction.   Pt states for last 3 days he has been having loose stools every time he eats.           Review of Systems  Constitutional: Negative for chills, fatigue and fever.  Respiratory: Negative for cough, chest tightness, shortness of breath and wheezing.   Cardiovascular: Negative for chest pain and palpitations.  Gastrointestinal: Positive for abdominal pain and diarrhea. Negative for abdominal distention, blood in stool, constipation, nausea and vomiting.       Mild abdomen discomfort.  Genitourinary: Negative for dysuria, flank pain and hematuria.  Musculoskeletal: Negative for back pain.  Skin: Negative for rash.  Neurological: Negative for dizziness, seizures and weakness.  Hematological: Negative for adenopathy. Does not bruise/bleed easily.  Psychiatric/Behavioral: Negative for behavioral problems.    Past Medical History:  Diagnosis Date  . Anxiety   . Anxiety and depression 03/14/2010  . Colonic inertia    with chronic lifelong costipation  . Congenital fusion of cervical spine   . Depression   . FECAL INCONTINENCE 08/19/2007  . H/O: GI bleed   . Heart murmur    early childhood   . Hydrocephalus --per CT 04/2014 07/23/2014  . Idiopathic scoliosis   . 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 Pneumonia    childhood      Social History   Socioeconomic History  . Marital status: Single    Spouse name: Not on file  . Number of children: 0  . Years of education: Not on file  . Highest education level: Not on  file  Occupational History  . 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 Smoking status: Never Smoker  . Smokeless tobacco: Never Used  Vaping Use  . Vaping Use: Never used  Substance and Sexual Activity  . Alcohol use: No    Alcohol/week: 0.0 standard drinks  . Drug use: No  . Sexual activity: Yes  Other Topics Concern  . Not on file  Social History Narrative   Lives w/ mother , has a younger brother and sister     Social Determinants of Health   Financial Resource Strain: Not on file  Food Insecurity: Not on file  Transportation Needs: Not on file  Physical Activity: Not on file  Stress: Not on file  Social Connections: Not on file  Intimate Partner Violence: Not on file    Past Surgical History:  Procedure Laterality Date  . COLONOSCOPY  11/08/2006  . HARDWARE REMOVAL Right 10/23/2015   Procedure: RIGHT KNEE HARDWARE REMOVAL;  Surgeon: Ollen Gross, MD;  Location: [facility];  Service: Orthopedics;  Laterality: Right;  LMA  . HERNIA REPAIR  1980   left inguinal hernia  . LAPAROTOMY  07/18/2011   Procedure: EXPLORATORY LAPAROTOMY;  Surgeon: Mariella Saa, MD;  Location: [facility];  Service: General;  Laterality: N/A;  lysis of adhesions entero enterostomy  . LAPAROTOMY  07/28/2011   Procedure: EXPLORATORY LAPAROTOMY;  Surgeon: Mariella Saa, MD;  Location: [facility];  Service: General;  Laterality: N/A;  . Left Knee surgery  1994, 1992  . ORIF PATELLA Right 12/14/2014   Procedure: OPEN REDUCTION INTERNAL (ORIF) FIXATION PATELLA;  Surgeon: Ollen Gross, MD;  Location: [facility];  Service: Orthopedics;  Laterality: Right;  . Surgery for imperforate anus  1958  . TONSILLECTOMY    . UPPER GASTROINTESTINAL ENDOSCOPY  05/15/03    Family History  Problem Relation Age of Onset  . Alzheimer's disease Father   . Diabetes Mother   . Cancer Mother   . Colon cancer Neg Hx   . Prostate cancer Neg Hx     No Known Allergies  Current Outpatient Medications on File Prior to Visit  Medication Sig Dispense Refill  . traMADol (ULTRAM) 50 MG tablet     . neomycin-polymyxin-hydrocortisone (CORTISPORIN) OTIC solution Apply 1-2 drops to toe after soaking twice a day (Patient not taking: Reported on 05/07/2020) 10 mL 0   No current facility-administered medications on file prior to visit.    BP 104/61   Pulse 65   Temp 98.2 F (36.8 C) (Oral)   Resp 16   Ht 5\' 6"  (1.676 m)   Wt 111 lb 12.8 oz (50.7  kg)   SpO2 100%   BMI 18.04 kg/m      Objective:   Physical Exam  General Mental Status- Alert. General Appearance- Not in acute distress.   Skin General: Color- Normal Color. Moisture- Normal Moisture.  Neck Carotid Arteries- Normal color. Moisture- Normal Moisture. No carotid bruits. No JVD.  Chest and Lung Exam Auscultation: Breath Sounds:-Normal.  Cardiovascular Auscultation:Rythm- Regular. Murmurs & Other Heart Sounds:Auscultation of the heart reveals- No Murmurs.  Abdomen Inspection:-Inspeection Normal. Palpation/Percussion:Note:No mass. Palpation and Percussion of the abdomen reveal- Non Tender, Non Distended + BS, no rebound or guarding. Scars present on abdomen   Neurologic Cranial Nerve exam:- CN III-XII intact(No nystagmus), symmetric smile. Strength:- 5/5 equal and symmetric strength both upper and lower extremities.       Assessment & Plan:  [phone] pt sister  Velna Hatchet  [phone]  For hx of imperforate anus, hx of bowel obstruction and chronic diarrhea will get xray abdomen.  If no obstruction, dilated loops or severe constipation. Could advise just 1-2 tab immodium otc to help have one meal without any severe diarrhea. But need to evaluate xray first.  Placed referral to new gi and eventually will see new surgeon.  For severe neck pain related to ct finding advise continue tramadol. Rx advisement given. Send rx with instruction to fill when due.  Follow up 7 day or as needed  Time spent with patient today was 34  minutes which consisted of chart review, discussing diagnosis, work up treatment and documentation.

## 2020-05-07 NOTE — Telephone Encounter (Signed)
Patient called in reference to a referral that was place a year ago for gastro doc, patient states he needs to speak with someone there and can not remember who he was referred to. Patient give information to [employer] .

## 2020-05-07 NOTE — Patient Instructions (Addendum)
For hx of imperforate anus, hx of bowel obstruction and chronic diarrhea will get xray abdomen.  If no obstruction, dilated loops or severe constipation. Could advise just 1-2 tab immodium otc to help have one meal without any severe diarrhea. But need to evaluate xray first.  Placed referral to new gi and eventually will see new surgeon.  For severe neck pain related to ct finding advise continue tramadol. Rx advisement given. Send rx with instruction to fill when due. Rx advisement given  Follow up 7 day or as needed

## 2020-05-08 LAB — DRUG MONITORING, PANEL 8 WITH CONFIRMATION, URINE
6 Acetylmorphine: NEGATIVE ng/mL (ref <10)
Alcohol Metabolites: NEGATIVE ng/mL
Amphetamines: NEGATIVE ng/mL (ref <500)
Benzodiazepines: NEGATIVE ng/mL (ref <100)
Buprenorphine, Urine: NEGATIVE ng/mL (ref <5)
Cocaine Metabolite: NEGATIVE ng/mL (ref <150)
Creatinine: 138.1 mg/dL
MDMA: NEGATIVE ng/mL (ref <500)
Marijuana Metabolite: NEGATIVE ng/mL (ref <20)
Opiates: NEGATIVE ng/mL (ref <100)
Oxidant: NEGATIVE ug/mL
Oxycodone: NEGATIVE ng/mL (ref <100)
pH: 4.9 (ref 4.5–9.0)

## 2020-05-08 LAB — DM TEMPLATE

## 2020-05-13 ENCOUNTER — Telehealth: Payer: Self-pay | Admitting: Medical

## 2020-05-13 ENCOUNTER — Encounter: Payer: Self-pay | Admitting: Gastroenterology

## 2020-05-13 NOTE — Telephone Encounter (Signed)
Patient states he called GI to schedule appt. Patient states he has  been dismissed from [facility]. Please advise

## 2020-05-14 NOTE — Telephone Encounter (Signed)
Pt notified to call [facility] to make an appt

## 2020-05-14 NOTE — Telephone Encounter (Signed)
I am not sure why he was dismissed from [facility] [employer] [facility] [facility]??/not sure what reason is?). I think it is best he contact GI office with [facility]. He is already established with them. They can coordinate and get him rescheduled with general surgeon.   If not then will need to find completely new GI. May take some time. If he contacts [facility] GI that he already has seen then he will eventually be seen quicker.

## 2020-05-20 MED FILL — traMADol HCL 50 MG TABS: 50 | 30 days supply | Qty: 120 | Fill #4

## 2020-05-23 ENCOUNTER — Encounter: Payer: Self-pay | Admitting: Medical

## 2020-05-29 DIAGNOSIS — Q423 Congenital absence, atresia and stenosis of anus without fistula: Secondary | ICD-10-CM | POA: Diagnosis not present

## 2020-05-29 DIAGNOSIS — K622 Anal prolapse: Secondary | ICD-10-CM | POA: Diagnosis not present

## 2020-05-29 DIAGNOSIS — Z20822 Contact with and (suspected) exposure to covid-19: Secondary | ICD-10-CM | POA: Diagnosis not present

## 2020-05-29 DIAGNOSIS — R627 Adult failure to thrive: Secondary | ICD-10-CM | POA: Diagnosis not present

## 2020-05-29 DIAGNOSIS — Z01812 Encounter for preprocedural laboratory examination: Secondary | ICD-10-CM | POA: Diagnosis not present

## 2020-05-29 DIAGNOSIS — K623 Rectal prolapse: Secondary | ICD-10-CM | POA: Diagnosis not present

## 2020-06-13 ENCOUNTER — Ambulatory Visit: Payer: Medicare HMO | Admitting: Gastroenterology

## 2020-06-18 ENCOUNTER — Other Ambulatory Visit: Payer: Self-pay | Admitting: Medical

## 2020-06-18 NOTE — Telephone Encounter (Signed)
Refill request for tramadol. No longer on med list. Please advise?  

## 2020-06-20 ENCOUNTER — Other Ambulatory Visit: Payer: Self-pay | Admitting: Medical

## 2020-06-20 ENCOUNTER — Telehealth: Payer: Self-pay | Admitting: Medical

## 2020-06-20 MED ORDER — TRAMADOL HCL 50 MG PO TABS: 50.0000 mg | ORAL_TABLET | Freq: Four times a day (QID) | ORAL | 0 refills | Status: AC | PRN

## 2020-06-20 NOTE — Telephone Encounter (Signed)
Patient states he would his pain medication sent to the pharmacy , please advise if we will send the medication in.

## 2020-06-20 NOTE — Telephone Encounter (Signed)
Refill tramadol. uds and contract up to date.

## 2020-06-26 ENCOUNTER — Other Ambulatory Visit: Payer: Self-pay | Admitting: Medical

## 2020-07-10 ENCOUNTER — Ambulatory Visit: Payer: Medicare HMO | Admitting: Podiatry

## 2020-07-17 ENCOUNTER — Telehealth: Payer: Self-pay | Admitting: Medical

## 2020-07-17 MED ORDER — TRAMADOL HCL 50 MG PO TABS: 50.0000 mg | ORAL_TABLET | Freq: Four times a day (QID) | ORAL | 0 refills | Status: AC | PRN

## 2020-07-17 NOTE — Telephone Encounter (Signed)
Refill of tramadol sent in. With not on prescription not to fill early as appears 4 day early refill request.

## 2020-07-17 NOTE — Telephone Encounter (Signed)
Medication: traMADol (ULTRAM) 50 MG tablet [[id number]] ENDED     Has the patient contacted their pharmacy? no (If no, request that the patient contact the pharmacy for the refill.) (If yes, when and what did the pharmacy advise?)    Preferred Pharmacy (with phone number or street name):   [facility] [address] [place] - [address] Phone:  [phone]  Fax:  [phone]         Agent: Please be advised that RX refills may take up to 3 business days. We ask that you follow-up with your pharmacy.

## 2020-07-17 NOTE — Telephone Encounter (Signed)
Requesting: tramadol Contract:01/05/2020 UDS:05/07/2020 Last Visit:05/07/2020 Next Visit:n/a Last Refill:  Please Advise

## 2020-09-30 ENCOUNTER — Other Ambulatory Visit: Payer: Self-pay | Admitting: Medical

## 2020-09-30 DIAGNOSIS — M542 Cervicalgia: Secondary | ICD-10-CM

## 2020-10-01 NOTE — Telephone Encounter (Signed)
Appt made

## 2020-10-01 NOTE — Telephone Encounter (Signed)
4 months since last saw pt. Needs controlled med visit. Please get him scheduled within a week.  Sent in 7 day rx of tramadol until then.

## 2020-10-02 ENCOUNTER — Ambulatory Visit: Payer: Medicare HMO | Admitting: Medical

## 2020-10-02 ENCOUNTER — Other Ambulatory Visit: Payer: Self-pay

## 2020-10-02 ENCOUNTER — Ambulatory Visit (INDEPENDENT_AMBULATORY_CARE_PROVIDER_SITE_OTHER): Payer: Medicare HMO | Admitting: Medical

## 2020-10-02 VITALS — BP 130/82 | HR 77 | Temp 98.1°F | Resp 18 | Ht 66.0 in | Wt 122.2 lb

## 2020-10-02 DIAGNOSIS — Z8719 Personal history of other diseases of the digestive system: Secondary | ICD-10-CM | POA: Diagnosis not present

## 2020-10-02 DIAGNOSIS — K565 Intestinal adhesions [bands], unspecified as to partial versus complete obstruction: Secondary | ICD-10-CM

## 2020-10-02 DIAGNOSIS — M542 Cervicalgia: Secondary | ICD-10-CM | POA: Diagnosis not present

## 2020-10-02 DIAGNOSIS — K622 Anal prolapse: Secondary | ICD-10-CM | POA: Diagnosis not present

## 2020-10-02 DIAGNOSIS — Q423 Congenital absence, atresia and stenosis of anus without fistula: Secondary | ICD-10-CM

## 2020-10-02 MED ORDER — TRAMADOL HCL 50 MG PO TABS: 50.0000 mg | ORAL_TABLET | Freq: Four times a day (QID) | ORAL | 3 refills | Status: AC | PRN

## 2020-10-02 NOTE — Progress Notes (Signed)
Reviewed    agree Patrick Smyre R Lowne Chase, DO  

## 2020-10-02 NOTE — Patient Instructions (Addendum)
Chronic neck pain. Controlled with tramadol. Severe degenerative changes with stenosis of cervical spine. Up to date on uds and contract. Refilled tramadol today.  Hx of chronic constipation with abdomen pain. Related to prior surgery and congenital condition. Currently doing very well with regular bm, no abdomen pain, eating well and gaining wt. Glad to hear doing well even without proposed surgery. Let me know if recurrent signs/symptoms as before.  Follow up 5 months or as needed.

## 2020-10-02 NOTE — Progress Notes (Signed)
Subjective:    Patient ID: [name], male    DOB: 09/07/1955, 65 y.o.   MRN: [id number]  HPI    Pt in for hx of  chronic severe neck pain.  IIsolated cervical spine disc and endplate degeneration at C5-C6 (moderate to severe) and C3-C4 (moderate). Mild spinal stenosis at both levels with up to mild spinal cord mass effect. Moderate to severe C6 neural foraminal stenosis suspected although better depicted on the January MRI.  C1 level spinal stenosis with up to mild cord mass effect, and other mild bilateral cervical foraminal stenosis, which are unrelated to disc disease.  Pt is on tramadol.  He is taking 4 tabs a day. Usually 2 in am, one afternoon and one at night. Up to date on contract and uds.  Pt has been referred to neurosurgeon he wants to hold off.    Pt has hx of chronic abdomen pain, constipation and difficult bm.  Pt had plans for  Procedures  PR LAP, SURG ILEO/JEJUNO-STOMY  LOOP ILEOSTOMY CREATION LAPAROSCOPIC  However he decided against surgery and states no recent abdomen pain for about 3 months. Currently has bm every 2 times a day on average. No describing constipation.  Pt has gained about 10 lbs since last visit.   Review of Systems  Constitutional: Negative for chills.  Respiratory: Negative for cough, chest tightness, shortness of breath and wheezing.   Cardiovascular: Negative for chest pain and palpitations.  Gastrointestinal: Negative for abdominal pain, constipation and vomiting.  Musculoskeletal: Positive for neck pain. Negative for back pain.  Skin: Negative for rash.  Neurological: Negative for dizziness and headaches.  Hematological: Negative for adenopathy. Does not bruise/bleed easily.  Psychiatric/Behavioral: Negative for behavioral problems and confusion. The patient is not nervous/anxious.     Past Medical History:  Diagnosis Date  . Anxiety   . Anxiety and depression 03/14/2010  . Colonic inertia    with chronic  lifelong costipation  . Congenital fusion of cervical spine   . Depression   . FECAL INCONTINENCE 08/19/2007  . H/O: GI bleed   . Heart murmur    early childhood   . Hydrocephalus --per CT 04/2014 07/23/2014  . Idiopathic scoliosis   . 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 Pneumonia    childhood      Social History   Socioeconomic History  . Marital status: Single    Spouse name: Not on file  . Number of children: 0  . Years of education: Not on file  . Highest education level: Not on file  Occupational History  . 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 Smoking status: Never Smoker  . Smokeless tobacco: Never Used  Vaping Use  . Vaping Use: Never used  Substance and Sexual Activity  . Alcohol use: No    Alcohol/week: 0.0 standard drinks  . Drug use: No  . Sexual activity: Yes  Other Topics Concern  . Not on file  Social History Narrative   Lives w/ mother , has a younger brother and sister    Social Determinants of Health   Financial Resource Strain: Not on file  Food Insecurity: Not on file  Transportation Needs: Not on file  Physical Activity: Not on file  Stress: Not on file  Social Connections: Not on file  Intimate Partner Violence: Not on file    Past Surgical History:  Procedure Laterality Date  . COLONOSCOPY  11/08/2006  . HARDWARE REMOVAL Right 10/23/2015  Procedure: RIGHT KNEE HARDWARE REMOVAL;  Surgeon: Ollen Gross, MD;  Location: [facility];  Service: Orthopedics;  Laterality: Right;  LMA  . HERNIA REPAIR  1980   left inguinal hernia  . LAPAROTOMY  07/18/2011   Procedure: EXPLORATORY LAPAROTOMY;  Surgeon: Mariella Saa, MD;  Location: [facility];  Service: General;  Laterality: N/A;  lysis of adhesions entero enterostomy  . LAPAROTOMY  07/28/2011   Procedure: EXPLORATORY LAPAROTOMY;  Surgeon: Mariella Saa, MD;  Location: [facility];  Service: General;  Laterality: N/A;  . Left Knee surgery  1994, 1992  . ORIF PATELLA  Right 12/14/2014   Procedure: OPEN REDUCTION INTERNAL (ORIF) FIXATION PATELLA;  Surgeon: Ollen Gross, MD;  Location: [facility];  Service: Orthopedics;  Laterality: Right;  . Surgery for imperforate anus  1958  . TONSILLECTOMY    . UPPER GASTROINTESTINAL ENDOSCOPY  05/15/03    Family History  Problem Relation Age of Onset  . Alzheimer's disease Father   . Diabetes Mother   . Cancer Mother   . Colon cancer Neg Hx   . Prostate cancer Neg Hx     Not on File  Current Outpatient Medications on File Prior to Visit  Medication Sig Dispense Refill  . traMADol (ULTRAM) 50 MG tablet TAKE ONE TABLET BY MOUTH EVERY 6 HOURS AS NEEDED FOR FOR SEVERE PAIN FOR UP TO 5 DAYS 28 tablet 0   No current facility-administered medications on file prior to visit.    BP 130/82   Pulse 77   Temp 98.1 F (36.7 C)   Resp 18   Ht 5\' 6"  (1.676 m)   Wt 122 lb 3.2 oz (55.4 kg)   SpO2 100%   BMI 19.72 kg/m       Objective:   Physical Exam   General Mental Status- Alert. General Appearance- Not in acute distress.   Skin General: Color- Normal Color. Moisture- Normal Moisture.  Neck Carotid Arteries- Normal color. Moisture- Normal Moisture. No carotid bruits. No JVD.  Chest and Lung Exam Auscultation: Breath Sounds:-Normal.  Cardiovascular Auscultation:Rythm- Regular. Murmurs & Other Heart Sounds:Auscultation of the heart reveals- No Murmurs.  Abdomen Inspection:-Inspeection Normal. Palpation/Percussion:Note:No mass. Palpation and Percussion of the abdomen reveal- Non Tender, Non Distended + BS, no rebound or guarding.   Neurologic Cranial Nerve exam:- CN III-XII intact(No nystagmus), symmetric smile. Strength:- 5/5 equal and symmetric strength both upper and lower extremities.     Assessment & Plan:  Chronic neck pain. Controlled with tramadol. Severe degenerative changes with stenosis of cervical spine. Up to date on uds and contract. Refilled tramadol today.  Hx of chronic  constipation with abdomen pain. Related to prior surgery and congenital condition. Currently doing very well with regular bm, no abdomen pain, eating well and gaining wt. Glad to hear doing well even without proposed surgery. Let me know if recurrent signs/symptoms as before.  Follow up 5 months or as needed  , [employer]

## 2020-11-07 ENCOUNTER — Telehealth: Payer: Self-pay | Admitting: Medical

## 2020-11-07 NOTE — Telephone Encounter (Signed)
Called pt to schedule an appt lvvm to return call

## 2020-11-07 NOTE — Telephone Encounter (Signed)
Patient would like to try something different than tramadol.

## 2020-11-08 NOTE — Telephone Encounter (Signed)
Spoke with pt states he seen a commercial about a new medication , once he finds out the name of the medication he will call back and schedule the appointment

## 2020-11-12 ENCOUNTER — Telehealth (INDEPENDENT_AMBULATORY_CARE_PROVIDER_SITE_OTHER): Payer: Medicare HMO | Admitting: Medical

## 2020-11-12 ENCOUNTER — Other Ambulatory Visit: Payer: Self-pay

## 2020-11-12 DIAGNOSIS — M25512 Pain in left shoulder: Secondary | ICD-10-CM

## 2020-11-12 DIAGNOSIS — M25511 Pain in right shoulder: Secondary | ICD-10-CM

## 2020-11-12 DIAGNOSIS — M542 Cervicalgia: Secondary | ICD-10-CM

## 2020-11-12 DIAGNOSIS — G8929 Other chronic pain: Secondary | ICD-10-CM | POA: Diagnosis not present

## 2020-11-12 NOTE — Progress Notes (Signed)
   Subjective:    Patient ID: [name], male    DOB: [birth year], 65 y.o.   MRN: [id number]  HPI   Virtual Visit via Telephone Note  I connected with [name] on 11/12/20 at  1:00 PM EDT by telephone and verified that I am speaking with the correct person using two identifiers.  Location: Patient: home Provider: office   I discussed the limitations, risks, security and privacy concerns of performing an evaluation and management service by telephone and the availability of in person appointments. I also discussed with the patient that there may be a patient responsible charge related to this service. The patient expressed understanding and agreed to proceed.   History of Present Illness:   Pt has question on medication Tremfya.  He had seen this on commercial and wondered if it helped for his neck and back pain.  By memory and upon review he has severe osteoarthritis type changes to cervical and thoracic spine.  However no history that matches psoriatic arthritis or rheumatoid arthritis.  Patient is currently taking tramadol for pain.  On review patient's not reporting any hand knee or elbow pain.  He does have some occasional shoulder pain.  Shoulder pain is much worse than his neck and his back pain.  Former x-ray of shoulders did not show significant findings.   Observations/Objective: General-no acute distress, pleasant, alert and oriented.  Normal speech.  Assessment and Plan: Chronic cervical spine/neck pain, shoulder pain and thoracic back pain.  Explained to patient that most of his work-up in the past indicated osteoarthritis as source of pain.  No findings of psoriatic arthritis or rheumatoid arthritis in the past.  I do not think Tremfya would be helpful.  Did explain in office if he has other joint pains we will do inflammatory labs.  Patient expressed understanding is agreeable to that.  Follow-up September or sooner if needed.  Esperanza Richters, PA-C    Follow Up Instructions:    I discussed the assessment and treatment plan with the patient. The patient was provided an opportunity to ask questions and all were answered. The patient agreed with the plan and demonstrated an understanding of the instructions.   The patient was advised to call back or seek an in-person evaluation if the symptoms worsen or if the condition fails to improve as anticipated.  Time spent with patient today was 15  minutes which consisted of chart revdiew, discussing diagnosis, work up treatment and documentation.    Esperanza Richters, PA-C  Review of Systems     Objective:   Physical Exam        Assessment & Plan:

## 2020-11-12 NOTE — Patient Instructions (Signed)
Chronic cervical spine/neck pain, shoulder pain and thoracic back pain.  Explained to patient that most of his work-up in the past indicated osteoarthritis as source of pain.  No findings of psoriatic arthritis or rheumatoid arthritis in the past.  I do not think Tremfya would be helpful.  Did explain in office if he has other joint pains we will do inflammatory labs.  Patient expressed understanding is agreeable to that.  Follow-up September or sooner if needed.

## 2020-11-27 ENCOUNTER — Ambulatory Visit: Payer: Medicare HMO | Admitting: Medical

## 2020-11-27 ENCOUNTER — Ambulatory Visit (INDEPENDENT_AMBULATORY_CARE_PROVIDER_SITE_OTHER): Payer: Medicare HMO | Admitting: Medical

## 2020-11-27 ENCOUNTER — Other Ambulatory Visit: Payer: Self-pay

## 2020-11-27 VITALS — BP 130/70 | HR 79 | Temp 98.2°F | Resp 18 | Ht 66.0 in | Wt 118.0 lb

## 2020-11-27 DIAGNOSIS — M542 Cervicalgia: Secondary | ICD-10-CM

## 2020-11-27 DIAGNOSIS — M25512 Pain in left shoulder: Secondary | ICD-10-CM | POA: Diagnosis not present

## 2020-11-27 DIAGNOSIS — M25511 Pain in right shoulder: Secondary | ICD-10-CM

## 2020-11-27 DIAGNOSIS — G8929 Other chronic pain: Secondary | ICD-10-CM | POA: Diagnosis not present

## 2020-11-27 MED ORDER — METHYLPREDNISOLONE 4 MG PO TABS
ORAL_TABLET | ORAL | 0 refills | Status: DC
Start: 2020-11-27 — End: 2021-03-04

## 2020-11-27 NOTE — Progress Notes (Signed)
Subjective:    Patient ID: [name], male    DOB: 1956-05-16, 64 y.o.   MRN: [id number]  HPI  Pt in for follow up. He has hx of neck pain and shoulder pain.   IMPRESSION: 1. 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 C6 is virtually the only normally formed cervical vertebrae (although demonstrates spina bifida occulta). No acute osseous abnormality in the spine.   2. 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 Mild spinal stenosis at both levels with up to mild spinal cord mass effect. Moderate to severe C6 neural foraminal stenosis suspected although better depicted on the January MRI.   3. C1 level spinal stenosis with up to mild cord mass effect, and other mild bilateral cervical foraminal stenosis, which are unrelated to disc disease.   4. No significant thoracic spinal stenosis. There is moderate to severe osseous neural foraminal stenosis on the left at the T2 and T3 nerve levels.   5. 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 There is only mild multifactorial bilateral L4 foraminal stenosis, with no convincing lumbar spinal or lateral recess stenosis.   Shoulder xray below.  IMPRESSION: There is no acute bony abnormality of the left shoulder. Mild narrowing of the subacromial subdeltoid space may be impacting the rotator cuff.   IMPRESSION: There is no acute or significant chronic bony abnormality of the right shoulder  Pt has chronic pain and is on tramadol.  He is interested in steroid injection.   Review of Systems  Constitutional:  Negative for chills, fatigue and fever.  Respiratory:  Negative for cough, chest tightness, shortness of breath and wheezing.   Cardiovascular:  Negative for chest pain and palpitations.  Gastrointestinal:  Negative for abdominal pain, constipation  and diarrhea.  Genitourinary:  Negative for dysuria, flank pain and frequency.  Musculoskeletal:  Negative for back pain, joint swelling and neck stiffness.  Skin:  Negative for rash.   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   Pneumonia    childhood      Social History   Socioeconomic History   Marital status: Single    Spouse name: Not on file   Number of children: 0   Years of education: Not on file   Highest education level: Not on file  Occupational History   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  Tobacco Use   Smoking status: Never   Smokeless tobacco: Never  Vaping Use   Vaping Use: Never used  Substance and Sexual Activity   Alcohol use: No    Alcohol/week: 0.0 standard drinks   Drug use: No   Sexual activity: Yes  Other Topics Concern   Not on file  Social History Narrative   Lives w/ mother , has a younger brother and sister    Social Determinants of Health   Financial Resource Strain: Not on file  Food Insecurity: Not on file  Transportation Needs: Not on file  Physical Activity:  Not on file  Stress: Not on file  Social Connections: Not on file  Intimate Partner Violence: Not on file    Past Surgical History:  Procedure Laterality Date   COLONOSCOPY  11/08/2006   HARDWARE REMOVAL Right 10/23/2015   Procedure: RIGHT KNEE HARDWARE REMOVAL;  Surgeon: Ollen Gross, MD;  Location: [facility];  Service: Orthopedics;  Laterality: Right;  LMA   HERNIA REPAIR  1980   left inguinal hernia   LAPAROTOMY  07/18/2011   Procedure: EXPLORATORY LAPAROTOMY;  Surgeon: Mariella Saa, MD;  Location: [facility];   Service: General;  Laterality: N/A;  lysis of adhesions entero enterostomy   LAPAROTOMY  07/28/2011   Procedure: EXPLORATORY LAPAROTOMY;  Surgeon: Mariella Saa, MD;  Location: [facility];  Service: General;  Laterality: N/A;   Left Knee surgery  1994, 1992   ORIF PATELLA Right 12/14/2014   Procedure: OPEN REDUCTION INTERNAL (ORIF) FIXATION PATELLA;  Surgeon: Ollen Gross, MD;  Location: [facility];  Service: Orthopedics;  Laterality: Right;   Surgery for imperforate anus  1958   TONSILLECTOMY     UPPER GASTROINTESTINAL ENDOSCOPY  05/15/03    Family History  Problem Relation Age of Onset   Alzheimer's disease Father    Diabetes Mother    Cancer Mother    Colon cancer Neg Hx    Prostate cancer Neg Hx     Not on File  Current Outpatient Medications on File Prior to Visit  Medication Sig Dispense Refill   traMADol (ULTRAM) 50 MG tablet TAKE ONE TABLET BY MOUTH EVERY 6 HOURS AS NEEDED FOR FOR SEVERE PAIN FOR UP TO 5 DAYS 28 tablet 0   No current facility-administered medications on file prior to visit.    BP 130/70   Pulse 79   Temp 98.2 F (36.8 C)   Resp 18   Ht 5\' 6"  (1.676 m)   Wt 118 lb (53.5 kg)   SpO2 100%   BMI 19.05 kg/m        Objective:   Physical Exam  General Mental Status- Alert. General Appearance- Not in acute distress.   Skin General: Color- Normal Color. Moisture- Normal Moisture.  Neck Carotid Arteries- Normal color. Moisture- Normal Moisture. No carotid bruits. No JVD. Mid cervical spine tender.  Chest and Lung Exam Auscultation: Breath Sounds:-Normal.  Cardiovascular Auscultation:Rythm- Regular. Murmurs & Other Heart Sounds:Auscultation of the heart reveals- No Murmurs.  Abdomen Inspection:-Inspeection Normal. Palpation/Percussion:Note:No mass. Palpation and Percussion of the abdomen reveal- Non Tender, Non Distended + BS, no rebound or guarding.    Neurologic Cranial Nerve exam:- CN III-XII intact(No nystagmus), symmetric  smile. Strength:- 5/5 equal and symmetric strength both upper and lower extremities.   Shoulder- pain on range of motion.       Assessment & Plan:   History of neck pain and bilateral shoulder pain.  Continue with tramadol for chronic pain.  You request possible left shoulder injection with steroid.  Will give tapered Medrol 6-day course today.  Will refer you also to sports medicine to get opinion if steroid injection directly into the joint could be helpful?.  Sports medicine might do ultrasound for left shoulder area.  Rx advisement given with medication.  Follow-up in 2 to 3 months for wellness exam or sooner if needed.  , PA-C

## 2020-11-27 NOTE — Patient Instructions (Addendum)
History of neck pain and bilateral shoulder pain.  Continue with tramadol for chronic pain.  You request possible left shoulder injection with steroid.  Will give tapered Medrol 6-day course today.  Will refer you also to sports medicine to get opinion if steroid injection directly into the joint could be helpful?.  Sports medicine might do ultrasound for left shoulder area.  Rx advisement given with medication.  Follow-up in 2 to 3 months for wellness exam or sooner if needed.

## 2020-11-29 ENCOUNTER — Ambulatory Visit: Payer: Medicare HMO | Admitting: Internal Medicine

## 2020-12-04 ENCOUNTER — Ambulatory Visit: Payer: Medicare HMO | Admitting: Family Medicine

## 2020-12-04 NOTE — Progress Notes (Deleted)
[name] - 65 y.o. male MRN [id number]  Date of birth: June 30, 1955  SUBJECTIVE:  Including CC & ROS.  No chief complaint on file.   [name] is a 65 y.o. male that is  ***.  ***   Review of Systems See HPI   HISTORY: Past Medical, Surgical, Social, and Family History Reviewed & Updated per EMR.   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   Pneumonia    childhood     Past Surgical History:  Procedure Laterality Date   COLONOSCOPY  11/08/2006   HARDWARE REMOVAL Right 10/23/2015   Procedure: RIGHT KNEE HARDWARE REMOVAL;  Surgeon: Ollen Gross, MD;  Location: [facility];  Service: Orthopedics;  Laterality: Right;  LMA   HERNIA REPAIR  1980   left inguinal hernia   LAPAROTOMY  07/18/2011   Procedure: EXPLORATORY LAPAROTOMY;  Surgeon: Mariella Saa, MD;  Location: [facility];  Service: General;  Laterality: N/A;  lysis of adhesions entero enterostomy   LAPAROTOMY  07/28/2011   Procedure: EXPLORATORY LAPAROTOMY;  Surgeon: Mariella Saa, MD;  Location: [facility];  Service: General;  Laterality: N/A;   Left Knee surgery  1994, 1992   ORIF PATELLA Right 12/14/2014   Procedure: OPEN REDUCTION INTERNAL (ORIF) FIXATION PATELLA;  Surgeon: Ollen Gross, MD;  Location: [facility];  Service: Orthopedics;  Laterality: Right;   Surgery for imperforate anus  1958   TONSILLECTOMY     UPPER GASTROINTESTINAL ENDOSCOPY  05/15/03    Family History  Problem Relation Age of Onset   Alzheimer's disease Father    Diabetes Mother    Cancer Mother    Colon cancer Neg Hx    Prostate cancer Neg Hx     Social History    Socioeconomic History   Marital status: Single    Spouse name: Not on file   Number of children: 0   Years of education: Not on file   Highest education level: Not on file  Occupational History   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  Tobacco Use   Smoking status: Never   Smokeless tobacco: Never  Vaping Use   Vaping Use: Never used  Substance and Sexual Activity   Alcohol use: No    Alcohol/week: 0.0 standard drinks   Drug use: No   Sexual activity: Yes  Other Topics Concern   Not on file  Social History Narrative   Lives w/ mother , has a younger brother and sister    Social Determinants of Corporate investment banker Strain: Not on file  Food Insecurity: Not on file  Transportation Needs: Not on file  Physical Activity: Not on file  Stress: Not on file  Social Connections: Not on file  Intimate Partner Violence: Not on file     PHYSICAL EXAM:  VS: There were no vitals taken for this visit. Physical Exam Gen: NAD, alert, cooperative with exam, well-appearing MSK:  ***      ASSESSMENT & PLAN:   No problem-specific Assessment & Plan notes found for  this encounter.

## 2020-12-05 ENCOUNTER — Encounter: Payer: Self-pay | Admitting: Family Medicine

## 2020-12-05 ENCOUNTER — Ambulatory Visit: Payer: Self-pay

## 2020-12-05 ENCOUNTER — Ambulatory Visit (INDEPENDENT_AMBULATORY_CARE_PROVIDER_SITE_OTHER): Payer: Medicare HMO | Admitting: Family Medicine

## 2020-12-05 ENCOUNTER — Other Ambulatory Visit: Payer: Self-pay

## 2020-12-05 VITALS — BP 104/70 | Ht 66.0 in | Wt 118.0 lb

## 2020-12-05 DIAGNOSIS — M7582 Other shoulder lesions, left shoulder: Secondary | ICD-10-CM | POA: Diagnosis not present

## 2020-12-05 DIAGNOSIS — M7581 Other shoulder lesions, right shoulder: Secondary | ICD-10-CM | POA: Diagnosis not present

## 2020-12-05 MED ORDER — METHYLPREDNISOLONE ACETATE 40 MG/ML IJ SUSP
40.0000 mg | Freq: Once | INTRAMUSCULAR | Status: AC
  Administered 2020-12-05: 40 mg via INTRA_ARTICULAR

## 2020-12-05 NOTE — Assessment & Plan Note (Signed)
Acute on chronic in nature.  -Counseled on home exercise therapy and supportive care. -Bilateral injections today. -Could consider nitro patches or physical therapy.

## 2020-12-05 NOTE — Patient Instructions (Signed)
Nice to meet you Please try heat before exercise and ice after Please try the exercises   Please send me a message in MyChart with any questions or updates.  Please see me back in 4 weeks.   --Dr. Gabriellia Rempel  

## 2020-12-05 NOTE — Progress Notes (Signed)
[name] - 65 y.o. male MRN [id number]  Date of birth: 1956-05-17  SUBJECTIVE:  Including CC & ROS.  No chief complaint on file.   [name] is a 65 y.o. male that is presenting with acute on chronic bilateral shoulder pain.  The pain is been ongoing for several years.  Has received injections in the past.  Most recent injection was over a year ago.  Denies any injury or inciting event.  It is worse with getting up in the morning..  Independent review of the CT cervical spine from 3/2 shows fusion at C4-5 and degenerative changes C5-6.  Independent review of the right shoulder x-ray from 2019 shows no acute changes.  Independent review of the left shoulder x-ray from 2019 shows no acute changes.   Review of Systems See HPI   HISTORY: Past Medical, Surgical, Social, and Family History Reviewed & Updated per EMR.   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   Pneumonia    childhood     Past Surgical History:  Procedure Laterality Date   COLONOSCOPY  11/08/2006   HARDWARE REMOVAL Right 10/23/2015   Procedure: RIGHT KNEE HARDWARE REMOVAL;  Surgeon: Ollen Gross, MD;  Location: [facility];  Service: Orthopedics;  Laterality: Right;  LMA   HERNIA REPAIR  1980   left inguinal hernia   LAPAROTOMY  07/18/2011   Procedure: EXPLORATORY LAPAROTOMY;  Surgeon: Mariella Saa, MD;  Location: [facility];  Service: General;  Laterality: N/A;  lysis of adhesions entero enterostomy   LAPAROTOMY  07/28/2011   Procedure: EXPLORATORY LAPAROTOMY;  Surgeon: Mariella Saa, MD;  Location: [facility];  Service: General;   Laterality: N/A;   Left Knee surgery  1994, 1992   ORIF PATELLA Right 12/14/2014   Procedure: OPEN REDUCTION INTERNAL (ORIF) FIXATION PATELLA;  Surgeon: Ollen Gross, MD;  Location: [facility];  Service: Orthopedics;  Laterality: Right;   Surgery for imperforate anus  1958   TONSILLECTOMY     UPPER GASTROINTESTINAL ENDOSCOPY  05/15/03    Family History  Problem Relation Age of Onset   Alzheimer's disease Father    Diabetes Mother    Cancer Mother    Colon cancer Neg Hx    Prostate cancer Neg Hx     Social History   Socioeconomic History   Marital status: Single    Spouse name: Not on file   Number of children: 0   Years of education: Not on file   Highest education level: Not on file  Occupational History   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  Tobacco Use   Smoking status: Never   Smokeless tobacco: Never  Vaping Use   Vaping Use: Never used  Substance and Sexual Activity   Alcohol use: No    Alcohol/week: 0.0 standard drinks   Drug use: No   Sexual activity: Yes  Other Topics Concern   Not on file  Social History Narrative   Lives w/ mother , has a younger brother and sister    Social Determinants  of Health   Financial Resource Strain: Not on file  Food Insecurity: Not on file  Transportation Needs: Not on file  Physical Activity: Not on file  Stress: Not on file  Social Connections: Not on file  Intimate Partner Violence: Not on file     PHYSICAL EXAM:  VS: BP 104/70 (BP Location: Left Arm, Patient Position: Sitting, Cuff Size: Normal)   Ht 5\' 6"  (1.676 m)   Wt 118 lb (53.5 kg)   BMI 19.05 kg/m  Physical Exam Gen: NAD, alert, cooperative with exam, well-appearing MSK:  Right left shoulder: Normal range of motion. Normal strength resistance. Negative empty can test. Neurovascular intact  Limited ultrasound: Right and left shoulder:  Chronic degenerative changes appreciated the supraspinatus.  Summary: Tendinosis  appreciated of the rotator cuff  Ultrasound and interpretation by , MD   Aspiration/Injection Procedure Note [name] 1955/09/22  Procedure: Injection Indications: Right shoulder pain  Procedure Details Consent: Risks of procedure as well as the alternatives and risks of each were explained to the (patient/caregiver).  Consent for procedure obtained. Time Out: Verified patient identification, verified procedure, site/side was marked, verified correct patient position, special equipment/implants available, medications/allergies/relevent history reviewed, required imaging and test results available.  Performed.  The area was cleaned with iodine and alcohol swabs.    The right subacromial space was injected using 1 cc's of 40 mg Depo-Medrol and 4 cc's of 0.25% bupivacaine with a 22 1 1/2" needle.  Ultrasound was used. Images were obtained in long views showing the injection.     A sterile dressing was applied.  Patient did tolerate procedure well.   Aspiration/Injection Procedure Note [name] 1956-05-03  Procedure: Injection Indications: Left shoulder pain  Procedure Details Consent: Risks of procedure as well as the alternatives and risks of each were explained to the (patient/caregiver).  Consent for procedure obtained. Time Out: Verified patient identification, verified procedure, site/side was marked, verified correct patient position, special equipment/implants available, medications/allergies/relevent history reviewed, required imaging and test results available.  Performed.  The area was cleaned with iodine and alcohol swabs.    The left subacromial space was injected using 1 cc's of 40 mg Depo-Medrol and 4 cc's of 0.25% bupivacaine with a 22 1 1/2" needle.  Ultrasound was used. Images were obtained in long views showing the injection.     A sterile dressing was applied.  Patient did tolerate procedure well.  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  -Counseled on home exercise therapy and supportive care. -Bilateral injections today. -Could consider nitro patches or physical therapy.

## 2020-12-26 ENCOUNTER — Ambulatory Visit: Payer: Medicare HMO | Admitting: Family Medicine

## 2020-12-26 ENCOUNTER — Other Ambulatory Visit: Payer: Self-pay

## 2021-01-06 ENCOUNTER — Ambulatory Visit: Payer: Medicare HMO | Admitting: Family Medicine

## 2021-01-06 NOTE — Progress Notes (Deleted)
[name] - 65 y.o. male MRN [id number]  Date of birth: 08-28-1955  SUBJECTIVE:  Including CC & ROS.  No chief complaint on file.   [name] is a 65 y.o. male that is  ***.  ***   Review of Systems See HPI   HISTORY: Past Medical, Surgical, Social, and Family History Reviewed & Updated per EMR.   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   Pneumonia    childhood     Past Surgical History:  Procedure Laterality Date   COLONOSCOPY  11/08/2006   HARDWARE REMOVAL Right 10/23/2015   Procedure: RIGHT KNEE HARDWARE REMOVAL;  Surgeon: Ollen Gross, MD;  Location: [facility];  Service: Orthopedics;  Laterality: Right;  LMA   HERNIA REPAIR  1980   left inguinal hernia   LAPAROTOMY  07/18/2011   Procedure: EXPLORATORY LAPAROTOMY;  Surgeon: Mariella Saa, MD;  Location: [facility];  Service: General;  Laterality: N/A;  lysis of adhesions entero enterostomy   LAPAROTOMY  07/28/2011   Procedure: EXPLORATORY LAPAROTOMY;  Surgeon: Mariella Saa, MD;  Location: [facility];  Service: General;  Laterality: N/A;   Left Knee surgery  1994, 1992   ORIF PATELLA Right 12/14/2014   Procedure: OPEN REDUCTION INTERNAL (ORIF) FIXATION PATELLA;  Surgeon: Ollen Gross, MD;  Location: [facility];  Service: Orthopedics;  Laterality: Right;   Surgery for imperforate anus  1958   TONSILLECTOMY     UPPER GASTROINTESTINAL ENDOSCOPY  05/15/03    Family History  Problem Relation Age of Onset   Alzheimer's disease Father    Diabetes Mother    Cancer Mother    Colon cancer Neg Hx    Prostate cancer Neg Hx     Social History    Socioeconomic History   Marital status: Single    Spouse name: Not on file   Number of children: 0   Years of education: Not on file   Highest education level: Not on file  Occupational History   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  Tobacco Use   Smoking status: Never   Smokeless tobacco: Never  Vaping Use   Vaping Use: Never used  Substance and Sexual Activity   Alcohol use: No    Alcohol/week: 0.0 standard drinks   Drug use: No   Sexual activity: Yes  Other Topics Concern   Not on file  Social History Narrative   Lives w/ mother , has a younger brother and sister    Social Determinants of Corporate investment banker Strain: Not on file  Food Insecurity: Not on file  Transportation Needs: Not on file  Physical Activity: Not on file  Stress: Not on file  Social Connections: Not on file  Intimate Partner Violence: Not on file     PHYSICAL EXAM:  VS: There were no vitals taken for this visit. Physical Exam Gen: NAD, alert, cooperative with exam, well-appearing MSK:  ***      ASSESSMENT & PLAN:   No problem-specific Assessment & Plan notes found for  this encounter.

## 2021-01-07 ENCOUNTER — Ambulatory Visit: Payer: Medicare HMO | Admitting: Family Medicine

## 2021-02-05 ENCOUNTER — Other Ambulatory Visit: Payer: Self-pay

## 2021-02-05 ENCOUNTER — Other Ambulatory Visit (HOSPITAL_BASED_OUTPATIENT_CLINIC_OR_DEPARTMENT_OTHER): Payer: Self-pay

## 2021-02-05 ENCOUNTER — Encounter: Payer: Self-pay | Admitting: Family Medicine

## 2021-02-05 ENCOUNTER — Ambulatory Visit (INDEPENDENT_AMBULATORY_CARE_PROVIDER_SITE_OTHER): Payer: Medicare HMO | Admitting: Family Medicine

## 2021-02-05 VITALS — Ht 66.0 in | Wt 122.0 lb

## 2021-02-05 DIAGNOSIS — M7581 Other shoulder lesions, right shoulder: Secondary | ICD-10-CM

## 2021-02-05 DIAGNOSIS — M7582 Other shoulder lesions, left shoulder: Secondary | ICD-10-CM | POA: Diagnosis not present

## 2021-02-05 MED ORDER — DICLOFENAC SODIUM 1 % EX GEL
2.0000 g | Freq: Four times a day (QID) | CUTANEOUS | 11 refills | Status: DC
Start: 2021-02-05 — End: 2024-02-23
  Filled 2021-02-05: qty 100, 13d supply, fill #0

## 2021-02-05 NOTE — Patient Instructions (Signed)
Good to see you Please try heat  Please try the voltaren   Please send me a message in MyChart with any questions or updates.  Please see me back in 4 weeks.   --Dr. Jordan Likes

## 2021-02-05 NOTE — Progress Notes (Signed)
[name] - 65 y.o. male MRN [id number]  Date of birth: 08/19/1955  SUBJECTIVE:  Including CC & ROS.  No chief complaint on file.   QUARAN KEDZIERSKI is a 65 y.o. male that is presenting with acute on chronic bilateral shoulder pain.  He has got improvement with previous steroid injections.  His pain is slowly starting to return.   Review of Systems See HPI   HISTORY: Past Medical, Surgical, Social, and Family History Reviewed & Updated per EMR.   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   Pneumonia    childhood     Past Surgical History:  Procedure Laterality Date   COLONOSCOPY  11/08/2006   HARDWARE REMOVAL Right 10/23/2015   Procedure: RIGHT KNEE HARDWARE REMOVAL;  Surgeon: Ollen Gross, MD;  Location: [facility];  Service: Orthopedics;  Laterality: Right;  LMA   HERNIA REPAIR  1980   left inguinal hernia   LAPAROTOMY  07/18/2011   Procedure: EXPLORATORY LAPAROTOMY;  Surgeon: Mariella Saa, MD;  Location: [facility];  Service: General;  Laterality: N/A;  lysis of adhesions entero enterostomy   LAPAROTOMY  07/28/2011   Procedure: EXPLORATORY LAPAROTOMY;  Surgeon: Mariella Saa, MD;  Location: [facility];  Service: General;  Laterality: N/A;   Left Knee surgery  1994, 1992   ORIF PATELLA Right 12/14/2014   Procedure: OPEN REDUCTION INTERNAL (ORIF) FIXATION PATELLA;  Surgeon: Ollen Gross, MD;  Location: [facility];  Service: Orthopedics;  Laterality: Right;   Surgery for imperforate anus  1958   TONSILLECTOMY     UPPER GASTROINTESTINAL ENDOSCOPY  05/15/03    Family History  Problem Relation Age of Onset    Alzheimer's disease Father    Diabetes Mother    Cancer Mother    Colon cancer Neg Hx    Prostate cancer Neg Hx     Social History   Socioeconomic History   Marital status: Single    Spouse name: Not on file   Number of children: 0   Years of education: Not on file   Highest education level: Not on file  Occupational History   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  Tobacco Use   Smoking status: Never   Smokeless tobacco: Never  Vaping Use   Vaping Use: Never used  Substance and Sexual Activity   Alcohol use: No    Alcohol/week: 0.0 standard drinks   Drug use: No   Sexual activity: Yes  Other Topics Concern   Not on file  Social History Narrative   Lives w/ mother , has a younger brother and sister    Social Determinants of Corporate investment banker Strain: Not on file  Food Insecurity: Not on file  Transportation Needs: Not on file  Physical Activity: Not on file  Stress: Not on file  Social Connections: Not on file  Intimate Partner Violence: Not on file     PHYSICAL EXAM:  VS: Ht 5\' 6"  (1.676 m)   Wt 122 lb (55.3 kg)   BMI 19.69 kg/m  Physical Exam Gen: NAD, alert, cooperative with exam, well-appearing      ASSESSMENT & PLAN:   Tendinitis of both rotator cuffs Acute on chronic in nature.  No injuries or inciting event. -Counseled on home exercise therapy and supportive care. -Voltaren. -Can do injections in the future.

## 2021-02-05 NOTE — Assessment & Plan Note (Signed)
Acute on chronic in nature.  No injuries or inciting event. -Counseled on home exercise therapy and supportive care. -Voltaren. -Can do injections in the future.

## 2021-02-10 ENCOUNTER — Other Ambulatory Visit: Payer: Self-pay | Admitting: Medical

## 2021-02-10 DIAGNOSIS — M542 Cervicalgia: Secondary | ICD-10-CM

## 2021-02-10 NOTE — Telephone Encounter (Signed)
Requesting: tramadol 50mg  Contract: 12/27/2019 UDS: 05/07/2020 Last Visit: 11/27/2020 Next Visit: 03/04/2021 Last Refill: 10/01/2020 #28 and 0RF  Please Advise

## 2021-02-11 ENCOUNTER — Ambulatory Visit (INDEPENDENT_AMBULATORY_CARE_PROVIDER_SITE_OTHER): Payer: Medicare HMO | Admitting: Medical

## 2021-02-11 ENCOUNTER — Other Ambulatory Visit: Payer: Self-pay

## 2021-02-11 ENCOUNTER — Other Ambulatory Visit (HOSPITAL_BASED_OUTPATIENT_CLINIC_OR_DEPARTMENT_OTHER): Payer: Self-pay

## 2021-02-11 VITALS — BP 130/70 | HR 75 | Resp 18 | Ht 66.0 in | Wt 120.0 lb

## 2021-02-11 DIAGNOSIS — G8929 Other chronic pain: Secondary | ICD-10-CM | POA: Diagnosis not present

## 2021-02-11 DIAGNOSIS — M25511 Pain in right shoulder: Secondary | ICD-10-CM

## 2021-02-11 DIAGNOSIS — M25512 Pain in left shoulder: Secondary | ICD-10-CM

## 2021-02-11 DIAGNOSIS — M542 Cervicalgia: Secondary | ICD-10-CM | POA: Diagnosis not present

## 2021-02-11 MED ORDER — TRAMADOL HCL 50 MG PO TABS
ORAL_TABLET | ORAL | 0 refills | Status: DC
  Filled 2021-02-11: qty 120, 30d supply, fill #0

## 2021-02-11 NOTE — Telephone Encounter (Signed)
Please deny refill since patient came into office today 02/11/21

## 2021-02-11 NOTE — Patient Instructions (Addendum)
History of neck pain and bilateral shoulder pain.  Continue with tramadol for chronic pain. Uds up to date but needs contract renewed. Refilling tramadol today.  Continue to follow up with Dr. Milagros Reap as steroid injection may help.  Bp on recheck in good range.  Follow up 3 month and well do wellness exam with fasting labs.

## 2021-02-11 NOTE — Progress Notes (Signed)
Subjective:    Patient ID: [name], male    DOB: 1956-02-22, 66 y.o.   MRN: [id number]  HPI Pt in for refill of tramadol. Got request yesterday. Pt contract not up to date.   Pt in for follow up. He has hx of neck pain and shoulder pain.    IMPRESSION: 1. 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 C6 is virtually the only normally formed cervical vertebrae (although demonstrates spina bifida occulta). No acute osseous abnormality in the spine.   2. 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 Mild spinal stenosis at both levels with up to mild spinal cord mass effect. Moderate to severe C6 neural foraminal stenosis suspected although better depicted on the January MRI.   3. C1 level spinal stenosis with up to mild cord mass effect, and other mild bilateral cervical foraminal stenosis, which are unrelated to disc disease.   4. No significant thoracic spinal stenosis. There is moderate to severe osseous neural foraminal stenosis on the left at the T2 and T3 nerve levels.   5. 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 There is only mild multifactorial bilateral L4 foraminal stenosis, with no convincing lumbar spinal or lateral recess stenosis.     Shoulder xray below.   IMPRESSION: There is no acute bony abnormality of the left shoulder. Mild narrowing of the subacromial subdeltoid space may be impacting the rotator cuff.     IMPRESSION: There is no acute or significant chronic bony abnormality of the right shoulder   Pt has chronic pain and is on tramadol.  He is interested in steroid injection.  Pt has seen Dr. Jordan Likes as well.   Tendinitis of both rotator cuffs Acute on chronic in nature.  No injuries or inciting event. -Counseled on home exercise therapy and supportive  care. -Voltaren. -Can do injections in the future.    Review of Systems  Constitutional:  Negative for chills, fatigue and fever.  Respiratory:  Negative for cough, chest tightness, shortness of breath and wheezing.   Cardiovascular:  Negative for chest pain and palpitations.  Gastrointestinal:  Negative for abdominal distention, anal bleeding, blood in stool, constipation, diarrhea and nausea.  Genitourinary:  Negative for dysuria, flank pain and frequency.  Musculoskeletal:  Negative for back pain and myalgias.  Skin:  Negative for rash.  Neurological:  Negative for dizziness, speech difficulty, weakness, numbness and headaches.  Hematological:  Negative for adenopathy. Does not bruise/bleed easily.  Psychiatric/Behavioral:  Negative for behavioral problems, decreased concentration, hallucinations and sleep disturbance. The patient is not nervous/anxious.    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   Pneumonia    childhood      Social History   Socioeconomic History   Marital status: Single    Spouse name: Not on file   Number of children: 0   Years of education: Not on file   Highest education level: Not on file  Occupational History   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  Tobacco Use   Smoking status: Never   Smokeless tobacco: Never  Vaping Use   Vaping Use: Never used  Substance and Sexual Activity   Alcohol use: No    Alcohol/week: 0.0 standard drinks   Drug use: No   Sexual activity: Yes  Other Topics Concern   Not on file  Social History Narrative   Lives w/ mother , has a younger brother and sister     Social Determinants of Health   Financial Resource Strain: Not on file  Food Insecurity: Not on file  Transportation Needs: Not on file  Physical Activity: Not on file  Stress: Not on file  Social Connections: Not on file  Intimate Partner Violence: Not on file    Past Surgical History:  Procedure Laterality Date   COLONOSCOPY  11/08/2006   HARDWARE REMOVAL Right 10/23/2015   Procedure: RIGHT KNEE HARDWARE REMOVAL;  Surgeon: Ollen Gross, MD;  Location: [facility];  Service: Orthopedics;  Laterality: Right;  LMA   HERNIA REPAIR  1980   left inguinal hernia   LAPAROTOMY  07/18/2011   Procedure: EXPLORATORY LAPAROTOMY;  Surgeon: Mariella Saa, MD;  Location: [facility];  Service: General;  Laterality: N/A;  lysis of adhesions entero enterostomy   LAPAROTOMY  07/28/2011   Procedure: EXPLORATORY LAPAROTOMY;  Surgeon: Mariella Saa, MD;  Location: [facility];  Service: General;  Laterality: N/A;   Left Knee surgery  1994, 1992   ORIF PATELLA Right 12/14/2014   Procedure: OPEN REDUCTION INTERNAL (ORIF) FIXATION PATELLA;  Surgeon: Ollen Gross, MD;  Location: [facility];  Service: Orthopedics;  Laterality: Right;   Surgery for imperforate anus  1958   TONSILLECTOMY     UPPER GASTROINTESTINAL ENDOSCOPY  05/15/03    Family History  Problem Relation Age of Onset   Alzheimer's disease Father    Diabetes Mother    Cancer Mother    Colon cancer Neg Hx    Prostate cancer Neg Hx     No Known Allergies  Current Outpatient Medications on File Prior to Visit  Medication Sig Dispense Refill   diclofenac Sodium (VOLTAREN) 1 % GEL Apply 2 grams topically 4 (four) times daily to affected joint. 100 g 11   methylPREDNISolone (MEDROL) 4 MG tablet Standard 6 day taper dose 21 tablet 0   traMADol (ULTRAM) 50 MG tablet TAKE ONE TABLET BY MOUTH EVERY 6 HOURS AS NEEDED FOR FOR SEVERE PAIN FOR UP TO 5 DAYS 28 tablet 0   No current facility-administered medications on file prior to visit.    BP (!)  149/78   Pulse 75   Resp 18   Ht 5\' 6"  (1.676 m)   Wt 120 lb (54.4 kg)   SpO2 93%   BMI 19.37 kg/m        Objective:   Physical Exam   General- No acute distress. Pleasant patient. Neck- mild mid c-spine pain presently. Trapezius tenderness. Lungs- Clear, even and unlabored. Heart- regular rate and rhythm. Neurologic- CNII- XII grossly intact.      Assessment & Plan:   History of neck pain and bilateral shoulder pain.  Continue with tramadol for chronic pain. UDS up to date but needs contract renewed. Refilling tramadol today.  Continue to follow up with Dr. as steroid injection may help.  Bp on recheck in good range.  Follow up 3 month and well do wellness exam with fasting labs.  Milagros Reap, PA-C   Sending copy of  note to supervisor to review since controlled med visit

## 2021-03-04 ENCOUNTER — Ambulatory Visit (INDEPENDENT_AMBULATORY_CARE_PROVIDER_SITE_OTHER): Payer: Medicare HMO | Admitting: Medical

## 2021-03-04 ENCOUNTER — Other Ambulatory Visit: Payer: Self-pay

## 2021-03-04 VITALS — BP 102/62 | HR 79 | Temp 98.1°F | Resp 18 | Ht 66.0 in | Wt 116.8 lb

## 2021-03-04 DIAGNOSIS — M25511 Pain in right shoulder: Secondary | ICD-10-CM

## 2021-03-04 DIAGNOSIS — M25512 Pain in left shoulder: Secondary | ICD-10-CM | POA: Diagnosis not present

## 2021-03-04 DIAGNOSIS — M542 Cervicalgia: Secondary | ICD-10-CM | POA: Diagnosis not present

## 2021-03-04 DIAGNOSIS — G8929 Other chronic pain: Secondary | ICD-10-CM | POA: Diagnosis not present

## 2021-03-04 MED ORDER — METHYLPREDNISOLONE 4 MG PO TABS: ORAL_TABLET | ORAL | 0 refills | Status: DC

## 2021-03-04 NOTE — Patient Instructions (Signed)
For both neck pain and shoulder pain from chronic conditions continue tramadol 50 mg every 6 hours and will add on medrol 4 mg taper dose. Let me know if your pain subsides or not as your chronic conditions leading to pain is challenging.   Follow up date to be determined after 7 day update. Sooner if needs.

## 2021-03-04 NOTE — Progress Notes (Signed)
Subjective:    Patient ID: [name], male    DOB: 1955/06/23, 65 y.o.   MRN: [id number]  HPI See last no hpi below.   Still has chronic pain but shoulder are more painful recently.  Pt in for follow up. He has hx of neck pain and shoulder pain.  this pain is despite using tramadol 50 mg every 6 hours.   IMPRESSION: 1. 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 C6 is virtually the only normally formed cervical vertebrae (although demonstrates spina bifida occulta). No acute osseous abnormality in the spine.   2. 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 Mild spinal stenosis at both levels with up to mild spinal cord mass effect. Moderate to severe C6 neural foraminal stenosis suspected although better depicted on the January MRI.   3. C1 level spinal stenosis with up to mild cord mass effect, and other mild bilateral cervical foraminal stenosis, which are unrelated to disc disease.   4. No significant thoracic spinal stenosis. There is moderate to severe osseous neural foraminal stenosis on the left at the T2 and T3 nerve levels.   5. 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 There is only mild multifactorial bilateral L4 foraminal stenosis, with no convincing lumbar spinal or lateral recess stenosis.     Shoulder xray below.   IMPRESSION: There is no acute bony abnormality of the left shoulder. Mild narrowing of the subacromial subdeltoid space may be impacting the rotator cuff.     IMPRESSION: There is no acute or significant chronic bony abnormality of the right shoulder   Pt has chronic pain and is on tramadol.  He is interested in steroid injection.   Pt has seen Dr. Jordan Likes as well.    Tendinitis of both rotator cuffs Acute on chronic in nature.  No injuries or inciting  event. -Counseled on home exercise therapy and supportive care. -Voltaren. -Can do injections in the future.      Review of Systems  Constitutional:  Negative for chills, fatigue and fever.  Respiratory:  Negative for cough, chest tightness, shortness of breath and wheezing.   Cardiovascular:  Negative for chest pain and palpitations.  Gastrointestinal:  Negative for abdominal pain and blood in stool.  Genitourinary:  Negative for dysuria, flank pain, frequency, penile pain, penile swelling and urgency.  Musculoskeletal:  Positive for neck pain.       Shoulder pain.   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   Pneumonia    childhood      Social History   Socioeconomic History   Marital status: Single    Spouse name: Not on file   Number of children: 0   Years of education: Not on file   Highest education level: Not on file  Occupational History   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  Tobacco Use   Smoking status: Never   Smokeless tobacco: Never  Vaping Use   Vaping Use:  Never used  Substance and Sexual Activity   Alcohol use: No    Alcohol/week: 0.0 standard drinks   Drug use: No   Sexual activity: Yes  Other Topics Concern   Not on file  Social History Narrative   Lives w/ mother , has a younger brother and sister    Social Determinants of Health   Financial Resource Strain: Not on file  Food Insecurity: Not on file  Transportation Needs: Not on file  Physical Activity: Not on file  Stress: Not on file  Social Connections: Not on file  Intimate Partner Violence: Not on file    Past Surgical  History:  Procedure Laterality Date   COLONOSCOPY  11/08/2006   HARDWARE REMOVAL Right 10/23/2015   Procedure: RIGHT KNEE HARDWARE REMOVAL;  Surgeon: Ollen Gross, MD;  Location: [facility];  Service: Orthopedics;  Laterality: Right;  LMA   HERNIA REPAIR  1980   left inguinal hernia   LAPAROTOMY  07/18/2011   Procedure: EXPLORATORY LAPAROTOMY;  Surgeon: Mariella Saa, MD;  Location: [facility];  Service: General;  Laterality: N/A;  lysis of adhesions entero enterostomy   LAPAROTOMY  07/28/2011   Procedure: EXPLORATORY LAPAROTOMY;  Surgeon: Mariella Saa, MD;  Location: [facility];  Service: General;  Laterality: N/A;   Left Knee surgery  1994, 1992   ORIF PATELLA Right 12/14/2014   Procedure: OPEN REDUCTION INTERNAL (ORIF) FIXATION PATELLA;  Surgeon: Ollen Gross, MD;  Location: [facility];  Service: Orthopedics;  Laterality: Right;   Surgery for imperforate anus  1958   TONSILLECTOMY     UPPER GASTROINTESTINAL ENDOSCOPY  05/15/03    Family History  Problem Relation Age of Onset   Alzheimer's disease Father    Diabetes Mother    Cancer Mother    Colon cancer Neg Hx    Prostate cancer Neg Hx     No Known Allergies  Current Outpatient Medications on File Prior to Visit  Medication Sig Dispense Refill   diclofenac Sodium (VOLTAREN) 1 % GEL Apply 2 grams topically 4 (four) times daily to affected joint. 100 g 11   methylPREDNISolone (MEDROL) 4 MG tablet Standard 6 day taper dose 21 tablet 0   traMADol (ULTRAM) 50 MG tablet TAKE ONE TABLET BY MOUTH EVERY 6 HOURS AS NEEDED FOR SEVERE PAIN FOR UP TO 5 DAYS 120 tablet 0   No current facility-administered medications on file prior to visit.    BP 102/62   Pulse 79   Temp 98.1 F (36.7 C)   Resp 18   Ht 5\' 6"  (1.676 m)   Wt 116 lb 12.8 oz (53 kg)   SpO2 99%   BMI 18.85 kg/m         Objective:   Physical Exam      General- No acute distress. Pleasant patient. Neck- mild mid c-spine pain presently. Trapezius tenderness. Lungs-  Clear, even and unlabored. Heart- regular rate and rhythm. Neurologic- CNII- XII grossly intact.  Shoulder- mild pain on range of motion presently.      Assessment & Plan:   Patient Instructions  For both neck pain and shoulder pain from chronic conditions continue tramadol 50 mg every 6 hours and will add on medrol 4 mg taper dose. Let me know if your pain subsides or not as your chronic conditions leading to pain is challenging.   Follow up date to be determined after 7 day update. Sooner if needs.   , PA-C

## 2021-03-07 ENCOUNTER — Encounter: Payer: Self-pay | Admitting: Family Medicine

## 2021-03-07 ENCOUNTER — Ambulatory Visit: Payer: Medicare HMO | Admitting: Family Medicine

## 2021-03-07 ENCOUNTER — Ambulatory Visit: Payer: Self-pay

## 2021-03-07 VITALS — Ht 66.0 in | Wt 116.0 lb

## 2021-03-07 DIAGNOSIS — M7582 Other shoulder lesions, left shoulder: Secondary | ICD-10-CM | POA: Diagnosis not present

## 2021-03-07 DIAGNOSIS — M7581 Other shoulder lesions, right shoulder: Secondary | ICD-10-CM

## 2021-03-07 MED ORDER — METHYLPREDNISOLONE ACETATE 40 MG/ML IJ SUSP
40.0000 mg | Freq: Once | INTRAMUSCULAR | Status: AC
  Administered 2021-03-07: 40 mg

## 2021-03-07 NOTE — Patient Instructions (Signed)
Good to see you Please use ice as needed  Please continue the exercises   Please send me a message in MyChart with any questions or updates.  Please see me back in 2-3 months.   --Dr. Jordan Likes

## 2021-03-07 NOTE — Progress Notes (Signed)
[name] - 65 y.o. male MRN [id number]  Date of birth: [birth year]  SUBJECTIVE:  Including CC & ROS.  No chief complaint on file.   [name] is a 65 y.o. male that is presenting with acute on chronic bilateral shoulder pain.  Has been worse with certain abduction activities.  No injuries or inciting event.    Review of Systems See HPI   HISTORY: Past Medical, Surgical, Social, and Family History Reviewed & Updated per EMR.   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   Pneumonia    childhood     Past Surgical History:  Procedure Laterality Date   COLONOSCOPY  11/08/2006   HARDWARE REMOVAL Right 10/23/2015   Procedure: RIGHT KNEE HARDWARE REMOVAL;  Surgeon: Ollen Gross, MD;  Location: [facility];  Service: Orthopedics;  Laterality: Right;  LMA   HERNIA REPAIR  1980   left inguinal hernia   LAPAROTOMY  07/18/2011   Procedure: EXPLORATORY LAPAROTOMY;  Surgeon: Mariella Saa, MD;  Location: [facility];  Service: General;  Laterality: N/A;  lysis of adhesions entero enterostomy   LAPAROTOMY  07/28/2011   Procedure: EXPLORATORY LAPAROTOMY;  Surgeon: Mariella Saa, MD;  Location: [facility];  Service: General;  Laterality: N/A;   Left Knee surgery  1994, 1992   ORIF PATELLA Right 12/14/2014   Procedure: OPEN REDUCTION INTERNAL (ORIF) FIXATION PATELLA;  Surgeon: Ollen Gross, MD;  Location: [facility];  Service: Orthopedics;  Laterality: Right;   Surgery for imperforate anus  1958   TONSILLECTOMY     UPPER GASTROINTESTINAL ENDOSCOPY  05/15/03    Family History  Problem Relation Age of Onset    Alzheimer's disease Father    Diabetes Mother    Cancer Mother    Colon cancer Neg Hx    Prostate cancer Neg Hx     Social History   Socioeconomic History   Marital status: Single    Spouse name: Not on file   Number of children: 0   Years of education: Not on file   Highest education level: Not on file  Occupational History   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  Tobacco Use   Smoking status: Never   Smokeless tobacco: Never  Vaping Use   Vaping Use: Never used  Substance and Sexual Activity   Alcohol use: No    Alcohol/week: 0.0 standard drinks   Drug use: No   Sexual activity: Yes  Other Topics Concern   Not on file  Social History Narrative   Lives w/ mother , has a younger brother and sister    Social Determinants of Corporate investment banker Strain: Not on file  Food Insecurity: Not on file  Transportation Needs: Not on file  Physical Activity: Not on file  Stress: Not on file  Social Connections: Not on file  Intimate Partner Violence: Not on file     PHYSICAL EXAM:  VS: Ht 5\' 6"  (1.676 m)   Wt 116 lb (52.6 kg)   BMI 18.72 kg/m  Physical  Exam Gen: NAD, alert, cooperative with exam, well-appearing    Aspiration/Injection Procedure Note [name] [birth year]  Procedure: Injection Indications: Left shoulder pain  Procedure Details Consent: Risks of procedure as well as the alternatives and risks of each were explained to the (patient/caregiver).  Consent for procedure obtained. Time Out: Verified patient identification, verified procedure, site/side was marked, verified correct patient position, special equipment/implants available, medications/allergies/relevent history reviewed, required imaging and test results available.  Performed.  The area was cleaned with iodine and alcohol swabs.    The left subacromial space was injected using 1 cc's of 40 mg Depo-Medrol and 4 cc's of 0.25% bupivacaine with a 21 1 1/2" needle.   Ultrasound was used. Images were obtained in long views showing the injection.     A sterile dressing was applied.  Patient did tolerate procedure well.  Aspiration/Injection Procedure Note [name] 03/26/1956  Procedure: Injection Indications: Right shoulder pain  Procedure Details Consent: Risks of procedure as well as the alternatives and risks of each were explained to the (patient/caregiver).  Consent for procedure obtained. Time Out: Verified patient identification, verified procedure, site/side was marked, verified correct patient position, special equipment/implants available, medications/allergies/relevent history reviewed, required imaging and test results available.  Performed.  The area was cleaned with iodine and alcohol swabs.    The Right subacromial space was injected using 1 cc's of 40 mg Depo-Medrol and 4 cc's of 0.25% bupivacaine with a 21 1 1/2" needle.  Ultrasound was used. Images were obtained in long views showing the injection.     A sterile dressing was applied.  Patient did tolerate procedure well.   ASSESSMENT & PLAN:   Tendinitis of both rotator cuffs Acute on chronic in nature.  Has gotten improvement with previous injections. -Counseled on exercise therapy and supportive care. -Injections today. -Could consider physical therapy

## 2021-03-07 NOTE — Assessment & Plan Note (Signed)
Acute on chronic in nature.  Has gotten improvement with previous injections. -Counseled on exercise therapy and supportive care. -Injections today. -Could consider physical therapy

## 2021-03-10 ENCOUNTER — Other Ambulatory Visit: Payer: Self-pay | Admitting: Medical

## 2021-03-10 ENCOUNTER — Telehealth: Payer: Self-pay | Admitting: Medical

## 2021-03-10 ENCOUNTER — Other Ambulatory Visit (HOSPITAL_BASED_OUTPATIENT_CLINIC_OR_DEPARTMENT_OTHER): Payer: Self-pay

## 2021-03-10 DIAGNOSIS — M542 Cervicalgia: Secondary | ICD-10-CM

## 2021-03-10 NOTE — Telephone Encounter (Signed)
Requesting: tramadol Contract:02/24/2021 UDS:05/07/2020 Last Visit:03/04/2021 Next Visit:n/a Last Refill:02/11/2021  Please Advise

## 2021-03-10 NOTE — Telephone Encounter (Signed)
On pt tramadol rx refill request what pharmacy will he use. Harris Engineer, civil (consulting).

## 2021-03-11 ENCOUNTER — Telehealth: Payer: Self-pay | Admitting: Medical

## 2021-03-11 DIAGNOSIS — M542 Cervicalgia: Secondary | ICD-10-CM

## 2021-03-11 MED ORDER — TRAMADOL HCL 50 MG PO TABS: ORAL_TABLET | ORAL | 0 refills | Status: DC

## 2021-03-11 NOTE — Telephone Encounter (Signed)
Pt called and he stated he wants the medication sent to [facility] in [place]

## 2021-03-11 NOTE — Telephone Encounter (Signed)
Rx tramadol sent to pt pharmacy. ? ?Bryleigh Ottaway, PA-C  ?

## 2021-03-21 ENCOUNTER — Ambulatory Visit: Payer: Medicare HMO | Admitting: Podiatry

## 2021-04-08 ENCOUNTER — Other Ambulatory Visit: Payer: Self-pay | Admitting: Medical

## 2021-04-08 DIAGNOSIS — M542 Cervicalgia: Secondary | ICD-10-CM

## 2021-04-09 NOTE — Telephone Encounter (Addendum)
Requesting: tramadol Contract:02/11/21 UDS:05/07/20 Last Visit:03/04/21 Next Visit:n/a Last Refill:03/11/21  Please Advise   Refill sent to pharmacy.  Have him do uds before 05/07/21.  Esperanza Richters, PA-C

## 2021-04-10 ENCOUNTER — Telehealth: Payer: Self-pay | Admitting: Medical

## 2021-04-10 NOTE — Telephone Encounter (Signed)
Rrefill request sent to provider on 11/16

## 2021-04-10 NOTE — Telephone Encounter (Signed)
Medication:  traMADol (ULTRAM) 50 MG tablet  Has the patient contacted their pharmacy? No. (If no, request that the patient contact the pharmacy for the refill.) (If yes, when and what did the pharmacy advise?)  Preferred Pharmacy (with phone number or street name): [facility] [id number] [place], [place] - [address]  [address] [place] [address]  Phone:  [phone]  Fax:  [phone]   Agent: Please be advised that RX refills may take up to 3 business days. We ask that you follow-up with your pharmacy.

## 2021-04-16 ENCOUNTER — Ambulatory Visit: Payer: Medicare HMO | Admitting: Podiatry

## 2021-04-28 ENCOUNTER — Encounter: Payer: Self-pay | Admitting: Podiatry

## 2021-04-28 ENCOUNTER — Other Ambulatory Visit: Payer: Self-pay

## 2021-04-28 ENCOUNTER — Ambulatory Visit: Payer: Medicare HMO | Admitting: Podiatry

## 2021-04-28 DIAGNOSIS — M79674 Pain in right toe(s): Secondary | ICD-10-CM | POA: Diagnosis not present

## 2021-04-28 DIAGNOSIS — L600 Ingrowing nail: Secondary | ICD-10-CM | POA: Diagnosis not present

## 2021-04-28 DIAGNOSIS — M79675 Pain in left toe(s): Secondary | ICD-10-CM

## 2021-04-28 DIAGNOSIS — B351 Tinea unguium: Secondary | ICD-10-CM

## 2021-04-28 NOTE — Progress Notes (Signed)
This patient returns to the office for evaluation and treatment of long thick painful nails .  This patient is unable to trim his own nails since the patient cannot reach his feet.  Patient says the nails are painful walking and wearing his shoes.He returns for preventive foot care services.  General Appearance  Alert, conversant and in no acute stress.   Vascular  Dorsalis pedis and posterior tibial  pulses are palpable  bilaterally.  Capillary return is within normal limits  bilaterally. Temperature is within normal limits  bilaterally.  Neurologic  Senn-Weinstein monofilament wire test within normal limits  bilaterally. Muscle power within normal limits bilaterally.  Nails Thick disfigured discolored nails with subungual debris  from hallux to fifth toes bilaterally. No evidence of bacterial infection or drainage bilaterally.  Great toenail left hallux surgically removed.  Orthopedic  No limitations of motion  feet .  No crepitus or effusions noted.  No bony pathology or digital deformities noted.  DJD 1st MPJ  B/L.  Skin  normotropic skin with no porokeratosis noted bilaterally.  No signs of infections or ulcers noted.     Onychomycosis  Pain in toes right foot  Pain in toes left foot  Debridement  of nails  1-5  B/L with a nail nipper.  Nails were then filed using a dremel tool with no incidents.    RTC  4 months with Dr. Alton Revere DPM

## 2021-05-07 ENCOUNTER — Telehealth: Payer: Self-pay | Admitting: Medical

## 2021-05-07 DIAGNOSIS — M542 Cervicalgia: Secondary | ICD-10-CM

## 2021-05-08 NOTE — Telephone Encounter (Signed)
Requesting: tramadol Contract:02/11/2021 UDS:05/07/20 Last Visit:03/04/21 Next Visit:n/a Last Refill: 04/10/21  Please Advise

## 2021-05-08 NOTE — Telephone Encounter (Addendum)
Patient called regarding his tramadol refill, he states that his tendonitis was hurting really bad and he had to take some extras, and that's why he is out early.    I sent in refill to pharmacy. He is about 2 days early. Pharmacy might refill early? Every pharmacy has different policy on if they will refill early.   Notify pt.

## 2021-05-08 NOTE — Telephone Encounter (Signed)
Pt notified and also wanted to let us know that he will be seeing sports medicine

## 2021-05-09 ENCOUNTER — Encounter: Payer: Self-pay | Admitting: Family Medicine

## 2021-05-09 ENCOUNTER — Ambulatory Visit: Payer: Medicare HMO | Admitting: Family Medicine

## 2021-05-09 ENCOUNTER — Other Ambulatory Visit (HOSPITAL_BASED_OUTPATIENT_CLINIC_OR_DEPARTMENT_OTHER): Payer: Self-pay

## 2021-05-09 VITALS — BP 110/80 | Ht 66.0 in | Wt 120.0 lb

## 2021-05-09 DIAGNOSIS — M5412 Radiculopathy, cervical region: Secondary | ICD-10-CM

## 2021-05-09 MED ORDER — GABAPENTIN 300 MG PO CAPS
300.0000 mg | ORAL_CAPSULE | Freq: Three times a day (TID) | ORAL | 1 refills | Status: DC
  Filled 2021-05-09: qty 90, 30d supply, fill #0

## 2021-05-09 NOTE — Progress Notes (Signed)
[name] - 65 y.o. male MRN [id number]  Date of birth: Sep 19, 1955  SUBJECTIVE:  Including CC & ROS.  No chief complaint on file.   [name] is a 65 y.o. male that is presenting with acute worsening of his right sided arm and hand pain.  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  Having symptoms and changes in sensation within the first 3 fingers as well.   Review of Systems See HPI   HISTORY: Past Medical, Surgical, Social, and Family History Reviewed & Updated per EMR.   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   Pneumonia    childhood     Past Surgical History:  Procedure Laterality Date   COLONOSCOPY  11/08/2006   HARDWARE REMOVAL Right 10/23/2015   Procedure: RIGHT KNEE HARDWARE REMOVAL;  Surgeon: Gaynelle Arabian, MD;  Location: [facility];  Service: Orthopedics;  Laterality: Right;  LMA   HERNIA REPAIR  1980   left inguinal hernia   LAPAROTOMY  07/18/2011   Procedure: EXPLORATORY LAPAROTOMY;  Surgeon: Edward Jolly, MD;  Location: [facility];  Service: General;  Laterality: N/A;  lysis of adhesions entero enterostomy   LAPAROTOMY  07/28/2011   Procedure: EXPLORATORY LAPAROTOMY;  Surgeon: Edward Jolly, MD;  Location: [facility];  Service: General;  Laterality: N/A;   Left Knee surgery  1994, 1992   ORIF PATELLA Right 12/14/2014   Procedure: OPEN REDUCTION INTERNAL (ORIF) FIXATION PATELLA;  Surgeon: Gaynelle Arabian, MD;  Location: [facility];  Service: Orthopedics;  Laterality: Right;   Surgery for imperforate anus  1958    TONSILLECTOMY     UPPER GASTROINTESTINAL ENDOSCOPY  05/15/03    Family History  Problem Relation Age of Onset   Alzheimer's disease Father    Diabetes Mother    Cancer Mother    Colon cancer Neg Hx    Prostate cancer Neg Hx     Social History   Socioeconomic History   Marital status: Single    Spouse name: Not on file   Number of children: 0   Years of education: Not on file   Highest education level: Not on file  Occupational History   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  Tobacco Use   Smoking status: Never   Smokeless tobacco: Never  Vaping Use   Vaping Use: Never used  Substance and Sexual Activity   Alcohol use: No    Alcohol/week: 0.0 standard drinks   Drug use: No   Sexual activity: Yes  Other Topics Concern   Not on file  Social History Narrative   Lives w/ mother , has a younger brother and sister    Social Determinants of Health   Financial Resource Strain: Not on file  Food Insecurity: Not on file  Transportation Needs: Not on file  Physical Activity: Not on file  Stress: Not on file  Social Connections: Not on file  Intimate Partner Violence: Not on file  PHYSICAL EXAM:  VS: BP 110/80 (BP Location: Left Arm, Patient Position: Sitting)    Ht '5\' 6"'  (1.676 m)    Wt 120 lb (54.4 kg)    BMI 19.37 kg/m  Physical Exam Gen: NAD, alert, cooperative with exam, well-appearing    ASSESSMENT & PLAN:   Cervical radiculopathy Has incomplete segmentation and spina bifida occulta within the cervical spine.  The symptoms today are acute on chronic in nature.  He has met with a neurosurgeon previously who recommended surgery.  He is wanting to hold off on that for now. -Counseled on home exercise therapy and supportive care. -Gabapentin. -Could consider adding additional tramadol.

## 2021-05-09 NOTE — Patient Instructions (Signed)
Good to see you  Please send me a message in MyChart with any questions or updates.  Please see me back in 2-3 months.   --Dr. Laporchia Nakajima  

## 2021-05-09 NOTE — Assessment & Plan Note (Signed)
Has incomplete segmentation and spina bifida occulta within the cervical spine.  The symptoms today are acute on chronic in nature.  He has met with a neurosurgeon previously who recommended surgery.  He is wanting to hold off on that for now. -Counseled on home exercise therapy and supportive care. -Gabapentin. -Could consider adding additional tramadol.

## 2021-05-22 ENCOUNTER — Telehealth: Payer: Self-pay | Admitting: Family Medicine

## 2021-05-22 NOTE — Telephone Encounter (Signed)
Unable to leave VM for patient. If he calls back please have him speak with a nurse/CMA and inform that he can try decreasing the dose or we could change lyrica.   If any questions then please take the best time and phone number to call and I will try to call him back.   Myra Rude, MD [facility] 05/22/2021, 12:09 PM

## 2021-05-22 NOTE — Telephone Encounter (Signed)
Patient called states side effects of Gabapentin is severe diarrhea & he request a diff medication.   --Pt uses :  Preferred Pharmacies     [facility] [id number] - [place], [place] - [id number] Phone:  [phone]  Fax:  [phone]    --[name]

## 2021-05-23 ENCOUNTER — Ambulatory Visit: Payer: Medicare HMO | Admitting: Family Medicine

## 2021-05-27 NOTE — Telephone Encounter (Signed)
Patient states he has been taking 6 pills of his tramadol medication, and is running low. He is also waiting to get the medication that sports medicine is going to give him. Call got disconnected. Will try to contact patient.

## 2021-05-27 NOTE — Telephone Encounter (Signed)
No answer no vm  Need to contact Dr. Jordan Likes for tramadol refills as they discuss they may consider at visit.

## 2021-05-28 NOTE — Telephone Encounter (Signed)
Pt informed of below.  He is requesting a new rx for Lyrica instead. He states he has already stopped gabapentin due to diarrhea.

## 2021-05-29 ENCOUNTER — Other Ambulatory Visit: Payer: Self-pay | Admitting: Family Medicine

## 2021-05-29 MED ORDER — PREGABALIN 75 MG PO CAPS: 75.0000 mg | ORAL_CAPSULE | Freq: Every day | ORAL | 1 refills | Status: DC

## 2021-05-29 NOTE — Telephone Encounter (Signed)
Provided lyrica and stop gabapentin.   Myra Rude, MD [facility] 05/29/2021, 5:49 PM

## 2021-05-30 ENCOUNTER — Telehealth: Payer: Self-pay

## 2021-05-30 NOTE — Telephone Encounter (Signed)
Caller states he doesn't want to see physical therapist anymore or sports medicine. He has 4 tramadol left, and wants him to prescribe tramadol. Additional Comment He takes 4 a day,he wants to advise the dr is slow about getting him relief.

## 2021-05-30 NOTE — Telephone Encounter (Signed)
Spoke with patient he states he only takes tramadol and not the lyrica because he only wants to take the tramadol and just wants to stick to that , and would like a refill for tramadol , made him aware he isnt allowed another refill until 06/08/20 , and asked for a personal call , I told him that would not happen.

## 2021-05-30 NOTE — Telephone Encounter (Signed)
Pt notified and he stated he will try Lyrica

## 2021-06-02 ENCOUNTER — Telehealth: Payer: Self-pay | Admitting: Family Medicine

## 2021-06-02 NOTE — Telephone Encounter (Signed)
Disregard this message pt cld asking for Tramadol Rx refill( advised him that he had 2 refills @ [facility]  from [place] for 120 tabs just written 05/08/22.Marland Kitchen  No New Rx needed.  --glh

## 2021-06-03 MED ORDER — ACETAMINOPHEN ER 650 MG PO TBCR: 650.0000 mg | EXTENDED_RELEASE_TABLET | Freq: Three times a day (TID) | ORAL | 0 refills | Status: DC | PRN

## 2021-06-03 NOTE — Telephone Encounter (Signed)
Patient would like to have some extra strength Tylenol  prescribed to hold him over until his tramadol refill can be given to him. Please advise.

## 2021-06-03 NOTE — Addendum Note (Signed)
Addended by: Gwenevere Abbot on: 06/03/2021 08:31 PM   Modules accepted: Orders

## 2021-06-03 NOTE — Telephone Encounter (Signed)
Pt advised it was OTC but still insisted on getting extra extra strength tylenol please advise

## 2021-06-04 ENCOUNTER — Other Ambulatory Visit (HOSPITAL_BASED_OUTPATIENT_CLINIC_OR_DEPARTMENT_OTHER): Payer: Self-pay

## 2021-06-04 ENCOUNTER — Telehealth: Payer: Self-pay | Admitting: Medical

## 2021-06-04 MED ORDER — ACETAMINOPHEN ER 650 MG PO TBCR
650.0000 mg | EXTENDED_RELEASE_TABLET | Freq: Three times a day (TID) | ORAL | 0 refills | Status: DC | PRN
  Filled 2021-06-04: qty 100, 34d supply, fill #0

## 2021-06-04 NOTE — Telephone Encounter (Signed)
Pt.notified

## 2021-06-04 NOTE — Telephone Encounter (Signed)
Rx resent.

## 2021-06-04 NOTE — Telephone Encounter (Signed)
Medication:  acetaminophen (TYLENOL 8 HOUR) 650 MG CR tablet [[id number]]     Has the patient contacted their pharmacy? Yes.   (If no, request that the patient contact the pharmacy for the refill.) (If yes, when and what did the pharmacy advise?) Pharmacy out of stock     Preferred Pharmacy (with phone number or street name):  [address] [place] [address] [phone]    Agent: Please be advised that RX refills may take up to 3 business days. We ask that you follow-up with your pharmacy.

## 2021-06-25 ENCOUNTER — Ambulatory Visit: Payer: Medicare HMO | Admitting: Family Medicine

## 2021-06-25 ENCOUNTER — Encounter: Payer: Self-pay | Admitting: Family Medicine

## 2021-06-25 DIAGNOSIS — M7581 Other shoulder lesions, right shoulder: Secondary | ICD-10-CM

## 2021-06-25 NOTE — Assessment & Plan Note (Signed)
Acute on chronic in nature.  Has a long history of shoulder pain.  Has MRI from 2002.  Has had repeated steroid injections and pain always returns. -Counseled on home exercise therapy and supportive care. -Referral to orthopedics.

## 2021-06-25 NOTE — Progress Notes (Signed)
°  [name] - 66 y.o. male MRN [id number]  Date of birth: [birth year]  SUBJECTIVE:  Including CC & ROS.  No chief complaint on file.   [name] is a 66 y.o. male that is presenting with acute on chronic right shoulder pain.  We have tried more than 1 injection into the area.  He continues to have pain on a regular basis..    Review of Systems See HPI   HISTORY: Past Medical, Surgical, Social, and Family History Reviewed & Updated per EMR.   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   Pneumonia    childhood     Past Surgical History:  Procedure Laterality Date   COLONOSCOPY  11/08/2006   HARDWARE REMOVAL Right 10/23/2015   Procedure: RIGHT KNEE HARDWARE REMOVAL;  Surgeon: Ollen Gross, MD;  Location: [facility];  Service: Orthopedics;  Laterality: Right;  LMA   HERNIA REPAIR  1980   left inguinal hernia   LAPAROTOMY  07/18/2011   Procedure: EXPLORATORY LAPAROTOMY;  Surgeon: Mariella Saa, MD;  Location: [facility];  Service: General;  Laterality: N/A;  lysis of adhesions entero enterostomy   LAPAROTOMY  07/28/2011   Procedure: EXPLORATORY LAPAROTOMY;  Surgeon: Mariella Saa, MD;  Location: [facility];  Service: General;  Laterality: N/A;   Left Knee surgery  1994, 1992   ORIF PATELLA Right 12/14/2014   Procedure: OPEN REDUCTION INTERNAL (ORIF) FIXATION PATELLA;  Surgeon: Ollen Gross, MD;  Location: [facility];  Service: Orthopedics;  Laterality: Right;   Surgery for imperforate anus  1958   TONSILLECTOMY     UPPER GASTROINTESTINAL ENDOSCOPY  05/15/03     PHYSICAL EXAM:  VS: BP 110/72 (BP Location:  Left Arm, Patient Position: Sitting)    Ht 5\' 6"  (1.676 m)    Wt 120 lb (54.4 kg)    BMI 19.37 kg/m  Physical Exam Gen: NAD, alert, cooperative with exam, well-appearing MSK:  Neurovascularly intact       ASSESSMENT & PLAN:   Rotator cuff tendinitis, right Acute on chronic in nature.  Has a long history of shoulder pain.  Has MRI from 2002.  Has had repeated steroid injections and pain always returns. -Counseled on home exercise therapy and supportive care. -Referral to orthopedics.

## 2021-06-26 ENCOUNTER — Encounter: Payer: Self-pay | Admitting: Family Medicine

## 2021-06-26 ENCOUNTER — Telehealth: Payer: Self-pay | Admitting: Medical

## 2021-06-26 NOTE — Telephone Encounter (Signed)
Patient would like to know if for his next Tramadol prescription, he could have it for 6 pills a day and not 4. Asked pt if he was already out and he stated that he had some but was thinking for the future. He states he will be getting right shoulder surgery for his tendonitis but does not have an appointment for it yet. Please advise.

## 2021-06-27 NOTE — Telephone Encounter (Signed)
Pt called and notified and he said will think about going to pain management

## 2021-06-28 DIAGNOSIS — M67911 Unspecified disorder of synovium and tendon, right shoulder: Secondary | ICD-10-CM | POA: Diagnosis not present

## 2021-07-01 DIAGNOSIS — M25511 Pain in right shoulder: Secondary | ICD-10-CM | POA: Diagnosis not present

## 2021-07-04 DIAGNOSIS — M75111 Incomplete rotator cuff tear or rupture of right shoulder, not specified as traumatic: Secondary | ICD-10-CM | POA: Diagnosis not present

## 2021-07-10 ENCOUNTER — Ambulatory Visit: Payer: Medicare HMO | Admitting: Family Medicine

## 2021-07-15 DIAGNOSIS — M7542 Impingement syndrome of left shoulder: Secondary | ICD-10-CM | POA: Diagnosis not present

## 2021-07-15 DIAGNOSIS — M75122 Complete rotator cuff tear or rupture of left shoulder, not specified as traumatic: Secondary | ICD-10-CM | POA: Diagnosis not present

## 2021-07-15 DIAGNOSIS — M7541 Impingement syndrome of right shoulder: Secondary | ICD-10-CM | POA: Diagnosis not present

## 2021-07-15 DIAGNOSIS — M75101 Unspecified rotator cuff tear or rupture of right shoulder, not specified as traumatic: Secondary | ICD-10-CM | POA: Diagnosis not present

## 2021-07-15 DIAGNOSIS — M24112 Other articular cartilage disorders, left shoulder: Secondary | ICD-10-CM | POA: Diagnosis not present

## 2021-07-15 DIAGNOSIS — G8918 Other acute postprocedural pain: Secondary | ICD-10-CM | POA: Diagnosis not present

## 2021-08-04 ENCOUNTER — Other Ambulatory Visit: Payer: Self-pay | Admitting: Medical

## 2021-08-04 DIAGNOSIS — M542 Cervicalgia: Secondary | ICD-10-CM

## 2021-08-04 NOTE — Telephone Encounter (Addendum)
Requesting: tramadol 50mg   ?Contract: 02/11/2021 ?UDS: 05/07/2020 ?Last Visit: 03/04/2021 ?Next Visit: None ?Last Refill: 05/08/2021 #120 and 2RF ? ?Please Advise ? ?Rx refill sent. Please have him schedule controlled med visit within a month. Important since UDS over due. ? ?05/10/2021, PA-C  ? ?

## 2021-08-05 DIAGNOSIS — M25611 Stiffness of right shoulder, not elsewhere classified: Secondary | ICD-10-CM | POA: Diagnosis not present

## 2021-08-05 DIAGNOSIS — M6281 Muscle weakness (generalized): Secondary | ICD-10-CM | POA: Diagnosis not present

## 2021-08-05 DIAGNOSIS — M25511 Pain in right shoulder: Secondary | ICD-10-CM | POA: Diagnosis not present

## 2021-08-07 DIAGNOSIS — M25511 Pain in right shoulder: Secondary | ICD-10-CM | POA: Diagnosis not present

## 2021-08-07 DIAGNOSIS — M6281 Muscle weakness (generalized): Secondary | ICD-10-CM | POA: Diagnosis not present

## 2021-08-07 DIAGNOSIS — M25611 Stiffness of right shoulder, not elsewhere classified: Secondary | ICD-10-CM | POA: Diagnosis not present

## 2021-08-13 DIAGNOSIS — M25611 Stiffness of right shoulder, not elsewhere classified: Secondary | ICD-10-CM | POA: Diagnosis not present

## 2021-08-13 DIAGNOSIS — M6281 Muscle weakness (generalized): Secondary | ICD-10-CM | POA: Diagnosis not present

## 2021-08-13 DIAGNOSIS — M25511 Pain in right shoulder: Secondary | ICD-10-CM | POA: Diagnosis not present

## 2021-08-14 ENCOUNTER — Ambulatory Visit (INDEPENDENT_AMBULATORY_CARE_PROVIDER_SITE_OTHER): Payer: Medicare HMO | Admitting: Medical

## 2021-08-14 VITALS — BP 114/74 | HR 77 | Temp 98.2°F | Ht 66.0 in | Wt 122.0 lb

## 2021-08-14 DIAGNOSIS — M5412 Radiculopathy, cervical region: Secondary | ICD-10-CM

## 2021-08-14 DIAGNOSIS — M7581 Other shoulder lesions, right shoulder: Secondary | ICD-10-CM | POA: Diagnosis not present

## 2021-08-14 DIAGNOSIS — M542 Cervicalgia: Secondary | ICD-10-CM

## 2021-08-14 DIAGNOSIS — Z79899 Other long term (current) drug therapy: Secondary | ICD-10-CM | POA: Diagnosis not present

## 2021-08-14 MED ORDER — TRAMADOL HCL 50 MG PO TABS
50.0000 mg | ORAL_TABLET | Freq: Four times a day (QID) | ORAL | 0 refills | Status: AC | PRN
Start: 2021-08-14 — End: 2021-08-19

## 2021-08-14 NOTE — Progress Notes (Signed)
? ?Subjective:  ? ? Patient ID: [name], male    DOB: [birth year], 66 y.o.   MRN: [id number] ? ?HPI ? ?Pt in with recent tendinitis of elbow. Also he has of cervical radiculopathy. See c-spine imaging studies.  ? ? ?Pt has chronic pain. Specialist gave him oxyccodone. Made him excessively groggy. He instead preferred to use tramadol. Using 2.5 tab in am and 2 at night. ? ? ?Pt states he tried lyrica in past and states did not help. ? ?Pt states he has 6 tablet of oxycycodone available. These were written by Dr. Tamera Punt. He states made him too drowsy and preferes tramadol ? ? ?Review of Systems  ?Constitutional:  Negative for chills, fatigue and fever.  ?Respiratory:  Negative for cough, chest tightness, shortness of breath, wheezing and stridor.   ?Cardiovascular:  Negative for chest pain and palpitations.  ?Gastrointestinal:  Negative for abdominal pain.  ?Musculoskeletal:  Positive for neck pain.  ?     Shoulder pain.  ?Skin:  Negative for rash.  ?Neurological:  Negative for dizziness, light-headedness and headaches.  ?Psychiatric/Behavioral:  Negative for behavioral problems and confusion.   ? ? ?Past Medical History:  ?Diagnosis Date  ? Anxiety   ? Anxiety and depression 03/14/2010  ? Colonic inertia   ? with chronic lifelong costipation  ? Congenital fusion of cervical spine   ? Depression   ? FECAL INCONTINENCE 08/19/2007  ? H/O: GI bleed   ? Heart murmur   ? early childhood   ? Hydrocephalus --per CT 04/2014 07/23/2014  ? Idiopathic scoliosis   ? 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 Pneumonia   ? childhood   ? ?  ?Social History  ? ?Socioeconomic History  ? Marital status: Single  ?  Spouse name: Not on file  ? Number of children: 0  ? Years of education: Not on file  ? Highest education level: Not on file  ?Occupational History  ? 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 Smoking status: Never  ? Smokeless tobacco: Never  ?Vaping Use  ? Vaping Use: Never used  ?Substance and  Sexual Activity  ? Alcohol use: No  ?  Alcohol/week: 0.0 standard drinks  ? Drug use: No  ? Sexual activity: Yes  ?Other Topics Concern  ? Not on file  ?Social History Narrative  ? Lives w/ mother , has a younger brother and sister   ? ?Social Determinants of Health  ? ?Financial Resource Strain: Not on file  ?Food Insecurity: Not on file  ?Transportation Needs: Not on file  ?Physical Activity: Not on file  ?Stress: Not on file  ?Social Connections: Not on file  ?Intimate Partner Violence: Not on file  ? ? ?Past Surgical History:  ?Procedure Laterality Date  ? COLONOSCOPY  11/08/2006  ? HARDWARE REMOVAL Right 10/23/2015  ? Procedure: RIGHT KNEE HARDWARE REMOVAL;  Surgeon: Gaynelle Arabian, MD;  Location: [facility];  Service: Orthopedics;  Laterality: Right;  LMA  ? HERNIA REPAIR  1980  ? left inguinal hernia  ? LAPAROTOMY  07/18/2011  ? Procedure: EXPLORATORY LAPAROTOMY;  Surgeon:  Jolly, MD;  Location: [facility];  Service: General;  Laterality: N/A;  lysis of adhesions entero enterostomy  ? LAPAROTOMY  07/28/2011  ? Procedure: EXPLORATORY LAPAROTOMY;  Surgeon:  Jolly, MD;  Location: [facility];  Service: General;  Laterality: N/A;  ? Left Knee surgery  1994, 1992  ? ORIF PATELLA Right 12/14/2014  ? Procedure:  OPEN REDUCTION INTERNAL (ORIF) FIXATION PATELLA;  Surgeon: Gaynelle Arabian, MD;  Location: [facility];  Service: Orthopedics;  Laterality: Right;  ? Surgery for imperforate anus  1958  ? TONSILLECTOMY    ? UPPER GASTROINTESTINAL ENDOSCOPY  05/15/03  ? ? ?Family History  ?Problem Relation Age of Onset  ? Alzheimer's disease Father   ? Diabetes Mother   ? Cancer Mother   ? Colon cancer Neg Hx   ? Prostate cancer Neg Hx   ? ? ?No Known Allergies ? ?Current Outpatient Medications on File Prior to Visit  ?Medication Sig Dispense Refill  ? acetaminophen (TYLENOL 8 HOUR) 650 MG CR tablet Take 1 tablet (650 mg total) by mouth every 8 (eight) hours as needed for pain. Take for 10 days. 100 tablet 0  ? diclofenac  Sodium (VOLTAREN) 1 % GEL Apply 2 grams topically 4 (four) times daily to affected joint. 100 g 11  ? methylPREDNISolone (MEDROL) 4 MG tablet Standard 6 day taper dose 21 tablet 0  ? pregabalin (LYRICA) 75 MG capsule Take 1 capsule (75 mg total) by mouth daily. 30 capsule 1  ? traMADol (ULTRAM) 50 MG tablet TAKE ONE TABLET BY MOUTH EVERY 6 HOURS AS NEEDED FOR SEVERE PAIN 120 tablet 1  ? ?No current facility-administered medications on file prior to visit.  ? ? ?BP 114/74   Pulse 77   Temp 98.2 ?F (36.8 ?C)   Ht 5\' 6"  (1.676 m)   Wt 122 lb (55.3 kg)   SpO2 100%   BMI 19.69 kg/m?  ?  ?   ?Objective:  ? Physical Exam ? ?General- No acute distress. Pleasant patient. ?Neck- Full range of motion, no jvd ?Lungs- Clear, even and unlabored. ?Heart- regular rate and rhythm. ?Neurologic- CNII- XII grossly intact.  ?Rt shoulder/arm in sling. ?Heent- rt canal blocked with cerumen. Left side canal clear and normal tm. ?Post lavage wax still present. Same amount can't see tm. ? ?   ?Assessment & Plan:  ? ?Patient Instructions  ?Chronic neck pain with severe degenerative changes.  Recent right shoulder pain and surgery.  Your on tramadol contract for chronic neck pain.  Surgeon gave you oxycodone postsurgery.  That you were not to use both tramadol and oxycodone at the same time.  Oxycodone makes you excessively drowsy and you found the tramadol worked better.  However did take half a tablet for 3 and I have previously prescribed per contract.  After discussion decided to prescribe you 135 tablets to last for 1 months.  Instruction would be 1 to 2 tablets every 6 hours as needed severe pain.  Not to exceed 4-1/2 tablet within 24 hours period.  Recommend spreading out tablets every 6 hours as you report when you waited 12 hours pain was worse. ? ?Update new contract and give UDS. ? ?Right ear cerumen impaction.  Procedure discussed and verbal consent given.  Left canal is clear with no wax present. ? ?Follow-up in 1 month or  sooner if needed.  ?Mackie Pai, PA-C  ?

## 2021-08-14 NOTE — Patient Instructions (Addendum)
Chronic neck pain with severe degenerative changes.  Recent right shoulder pain and surgery.  Your on tramadol contract for chronic neck pain.  Surgeon gave you oxycodone postsurgery.  That you were not to use both tramadol and oxycodone at the same time.  Oxycodone makes you excessively drowsy and you found the tramadol worked better.  However did take half a tablet for 3 and I have previously prescribed per contract.  After discussion decided to prescribe you 135 tablets to last for 1 months.  Instruction would be 1 to 2 tablets every 6 hours as needed severe pain.  Not to exceed 4-1/2 tablet within 24 hours period.  Recommend spreading out tablets every 6 hours as you report when you waited 12 hours pain was worse. ? ?Update new contract and give UDS. ? ?Right ear cerumen impaction.  Procedure discussed and verbal consent given.  Left canal is clear with no wax present. ? ?Post lavage dry wax still present. Advised debrox daily for 4-5 days. Recheck ear in 6 days and re-attempt. If does not clear then refer to ENT. ? ?Follow-up in 1 month or sooner if needed. ?

## 2021-08-16 LAB — DRUG MONITORING PANEL 376104, URINE
Amphetamines: NEGATIVE ng/mL (ref <500)
Barbiturates: NEGATIVE ng/mL (ref <300)
Benzodiazepines: NEGATIVE ng/mL (ref <100)
Cocaine Metabolite: NEGATIVE ng/mL (ref <150)
Desmethyltramadol: 4805 ng/mL — ABNORMAL HIGH (ref <100)
Opiates: NEGATIVE ng/mL (ref <100)
Oxycodone: NEGATIVE ng/mL (ref <100)
Tramadol: >10000 ng/mL — ABNORMAL HIGH (ref <100)

## 2021-08-16 LAB — DM TEMPLATE

## 2021-08-18 DIAGNOSIS — M25511 Pain in right shoulder: Secondary | ICD-10-CM | POA: Diagnosis not present

## 2021-08-18 DIAGNOSIS — M25611 Stiffness of right shoulder, not elsewhere classified: Secondary | ICD-10-CM | POA: Diagnosis not present

## 2021-08-18 DIAGNOSIS — M6281 Muscle weakness (generalized): Secondary | ICD-10-CM | POA: Diagnosis not present

## 2021-08-22 DIAGNOSIS — M6281 Muscle weakness (generalized): Secondary | ICD-10-CM | POA: Diagnosis not present

## 2021-08-22 DIAGNOSIS — M25611 Stiffness of right shoulder, not elsewhere classified: Secondary | ICD-10-CM | POA: Diagnosis not present

## 2021-08-22 DIAGNOSIS — M25511 Pain in right shoulder: Secondary | ICD-10-CM | POA: Diagnosis not present

## 2021-08-27 DIAGNOSIS — M25611 Stiffness of right shoulder, not elsewhere classified: Secondary | ICD-10-CM | POA: Diagnosis not present

## 2021-08-27 DIAGNOSIS — M25511 Pain in right shoulder: Secondary | ICD-10-CM | POA: Diagnosis not present

## 2021-08-27 DIAGNOSIS — M6281 Muscle weakness (generalized): Secondary | ICD-10-CM | POA: Diagnosis not present

## 2021-09-03 DIAGNOSIS — M6281 Muscle weakness (generalized): Secondary | ICD-10-CM | POA: Diagnosis not present

## 2021-09-03 DIAGNOSIS — M25611 Stiffness of right shoulder, not elsewhere classified: Secondary | ICD-10-CM | POA: Diagnosis not present

## 2021-09-03 DIAGNOSIS — M25511 Pain in right shoulder: Secondary | ICD-10-CM | POA: Diagnosis not present

## 2021-09-10 DIAGNOSIS — M6281 Muscle weakness (generalized): Secondary | ICD-10-CM | POA: Diagnosis not present

## 2021-09-10 DIAGNOSIS — M25611 Stiffness of right shoulder, not elsewhere classified: Secondary | ICD-10-CM | POA: Diagnosis not present

## 2021-09-10 DIAGNOSIS — M25511 Pain in right shoulder: Secondary | ICD-10-CM | POA: Diagnosis not present

## 2021-09-18 ENCOUNTER — Other Ambulatory Visit: Payer: Self-pay | Admitting: Medical

## 2021-09-18 DIAGNOSIS — M542 Cervicalgia: Secondary | ICD-10-CM

## 2021-09-18 NOTE — Telephone Encounter (Addendum)
Requesting: tramadol ?Contract:  08/13/21 ?UDS: 08/14/21 ?Last Visit: 08/14/21 ?Next Visit: none ?Last Refill: 08/04/21 ? ?Please Advise ? ?Rx refill sent to pt pharmacy. ? ?Mackie Pai, PA-C  ? ?

## 2021-09-25 DIAGNOSIS — M6281 Muscle weakness (generalized): Secondary | ICD-10-CM | POA: Diagnosis not present

## 2021-09-25 DIAGNOSIS — M25611 Stiffness of right shoulder, not elsewhere classified: Secondary | ICD-10-CM | POA: Diagnosis not present

## 2021-09-25 DIAGNOSIS — M25511 Pain in right shoulder: Secondary | ICD-10-CM | POA: Diagnosis not present

## 2021-09-26 ENCOUNTER — Ambulatory Visit (INDEPENDENT_AMBULATORY_CARE_PROVIDER_SITE_OTHER): Payer: Medicare HMO | Admitting: Medical

## 2021-09-26 VITALS — BP 120/70 | HR 70 | Resp 18 | Ht 66.0 in | Wt 118.0 lb

## 2021-09-26 DIAGNOSIS — H6121 Impacted cerumen, right ear: Secondary | ICD-10-CM | POA: Diagnosis not present

## 2021-09-26 DIAGNOSIS — Z20828 Contact with and (suspected) exposure to other viral communicable diseases: Secondary | ICD-10-CM | POA: Diagnosis not present

## 2021-09-26 NOTE — Progress Notes (Addendum)
? ?  Subjective:  ? ? Patient ID: [name], male    DOB: May 08, 1956, 66 y.o.   MRN: [id number] ? ?HPI ? ?Pt has clogged/plugged rt ear sensation. States had for at least 2 weeks. ? ? ?We did attempt lavage on last visit but was did not come out. Advised use debrox for 4-5 days and then could re-attempt lavage. No ear pain. No nasal congestion. Pt again give verbal consent to lavage. ? ? ? ? ?Review of Systems ?Decreased right ear hearing.  Resolved post lavage. ?   ?Objective:  ? Physical Exam ? ?General- No acute distress. Pleasant patient. ?Neck- Full range of motion, no jvd ?Lungs- Clear, even and unlabored. ?Heart- regular rate and rhythm. ?Neurologic- CNII- XII grossly intact.  ?HEENT-left canal clear no wax and TM normal.  Right canal prelavage a lot of wax present and obstructing view of TM.  Post lavage wax was cleared and TM normal. ? ?   ?Assessment & Plan:  ? ?Patient Instructions  ?Your right ear cerumen impaction was completely washed out successfully today(Pt tolerate procedure well.).  Post exam tympanic membrane looked normal.  Discussed preventative measures and using Debrox for 5 to 7 days prior to next potential lavage in the future.  Hopefully wax will reaccumulate quickly. ? ?Do want you to follow-up in 4 months for neck pain/controlled medication visit.  Sooner if needed.  ? ?Esperanza Richters, PA-C  ?

## 2021-09-26 NOTE — Patient Instructions (Addendum)
Your right ear cerumen impaction was completely washed out successfully today(Pt tolerate procedure well.).  Post exam tympanic membrane looked normal.  Discussed preventative measures and using Debrox for 5 to 7 days prior to next potential lavage in the future.  Hopefully wax will reaccumulate quickly. ? ?Do want you to follow-up in 4 months for neck pain/controlled medication visit.  Sooner if needed. ?

## 2021-09-29 ENCOUNTER — Ambulatory Visit: Payer: Medicare HMO

## 2021-09-29 NOTE — Progress Notes (Deleted)
Subjective:   [name] is a 66 y.o. male who presents for Medicare Annual/Subsequent preventive examination.  I connected with [name] today by telephone and verified that I am speaking with the correct person using two identifiers. Location patient: home Location provider: work Persons participating in the virtual visit: patient, Engineer, civil (consulting).    I discussed the limitations, risks, security and privacy concerns of performing an evaluation and management service by telephone and the availability of in person appointments. I also discussed with the patient that there may be a patient responsible charge related to this service. The patient expressed understanding and verbally consented to this telephonic visit.    Interactive audio and video telecommunications were attempted between this provider and patient, however failed, due to patient having technical difficulties OR patient did not have access to video capability.  We continued and completed visit with audio only.  Some vital signs may be absent or patient reported.   Time Spent with patient on telephone encounter: *** minutes   Review of Systems    ***       Objective:    There were no vitals filed for this visit. There is no height or weight on file to calculate BMI.     07/25/2019    8:21 AM 08/20/2018   12:27 PM 10/28/2017    4:14 PM 10/27/2016   10:27 AM 10/15/2015    2:30 PM 05/13/2015    3:30 PM 12/14/2014    1:28 PM  Advanced Directives  Does Patient Have a Medical Advance Directive? No No No No No No No  Copy of Healthcare Power of Attorney in Chart?     No - copy requested    Would patient like information on creating a medical advance directive? No - Patient declined No - Patient declined Yes (MAU/Ambulatory/Procedural Areas - Information given) Yes (MAU/Ambulatory/Procedural Areas - Information given) No - patient declined information  No - patient declined information    Current Medications (verified) Outpatient  Encounter Medications as of 09/29/2021  Medication Sig   acetaminophen (TYLENOL 8 HOUR) 650 MG CR tablet Take 1 tablet (650 mg total) by mouth every 8 (eight) hours as needed for pain. Take for 10 days.   diclofenac Sodium (VOLTAREN) 1 % GEL Apply 2 grams topically 4 (four) times daily to affected joint.   methylPREDNISolone (MEDROL) 4 MG tablet Standard 6 day taper dose   pregabalin (LYRICA) 75 MG capsule Take 1 capsule (75 mg total) by mouth daily.   traMADol (ULTRAM) 50 MG tablet 1-2 tab po q 6 hours prn pain.   No facility-administered encounter medications on file as of 09/29/2021.    Allergies (verified) Patient has no known allergies.   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   Pneumonia    childhood    Past Surgical History:  Procedure Laterality Date   COLONOSCOPY  11/08/2006   HARDWARE REMOVAL Right 10/23/2015   Procedure: RIGHT KNEE HARDWARE REMOVAL;  Surgeon: Ollen Gross, MD;  Location: [facility];  Service: Orthopedics;  Laterality: Right;  LMA   HERNIA REPAIR  1980   left inguinal hernia   LAPAROTOMY  07/18/2011   Procedure: EXPLORATORY LAPAROTOMY;  Surgeon: Mariella Saa, MD;  Location: [facility];  Service: General;  Laterality: N/A;  lysis of adhesions entero enterostomy   LAPAROTOMY  07/28/2011   Procedure: EXPLORATORY LAPAROTOMY;  Surgeon: Mariella Saa, MD;  Location: [facility];  Service: General;  Laterality: N/A;   Left Knee surgery  1994, 1992   ORIF PATELLA Right 12/14/2014   Procedure: OPEN REDUCTION INTERNAL (ORIF) FIXATION PATELLA;  Surgeon: Ollen Gross, MD;  Location: [facility];  Service: Orthopedics;  Laterality:  Right;   Surgery for imperforate anus  1958   TONSILLECTOMY     UPPER GASTROINTESTINAL ENDOSCOPY  05/15/03   Family History  Problem Relation Age of Onset   Alzheimer's disease Father    Diabetes Mother    Cancer Mother    Colon cancer Neg Hx    Prostate cancer Neg Hx    Social History   Socioeconomic History   Marital status: Single    Spouse name: Not on file   Number of children: 0   Years of education: Not on file   Highest education level: Not on file  Occupational History   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  Tobacco Use   Smoking status: Never   Smokeless tobacco: Never  Vaping Use   Vaping Use: Never used  Substance and Sexual Activity   Alcohol use: No    Alcohol/week: 0.0 standard drinks   Drug use: No   Sexual activity: Yes  Other Topics Concern   Not on file  Social History Narrative   Lives w/ mother , has a younger brother and sister    Social Determinants of Health   Financial Resource Strain: Not on file  Food Insecurity: Not on file  Transportation Needs: Not on file  Physical Activity: Not on file  Stress: Not on file  Social Connections: Not on file    Tobacco Counseling Counseling given: Not Answered   Clinical Intake:                 Diabetic?No         Activities of Daily Living     View : No data to display.          Patient Care Team: Saguier, Kateri Mc as PCP - General (Physician Assistant)  Indicate any recent Medical Services you may have received from other than [facility] providers in the past year (date may be approximate).     Assessment:   This is a routine wellness examination for [name].  Hearing/Vision screen No results found.  Dietary issues and exercise activities discussed:     Goals Addressed   None    Depression Screen    10/02/2020    9:47 AM 10/28/2017    4:14 PM 05/31/2017   12:00 PM 10/27/2016   10:27 AM  PHQ 2/9 Scores  PHQ - 2 Score 0 0 0 0    Fall  Risk    10/28/2017    4:14 PM 05/31/2017   12:00 PM 10/27/2016   10:27 AM 06/28/2015    2:06 PM 02/26/2015    2:01 PM  Fall Risk   Falls in the past year? No No No No No    FALL RISK PREVENTION PERTAINING TO THE HOME:  Any stairs in or around the home? {YES/NO:21197} If so, are there any without handrails? {YES/NO:21197} Home free of loose throw rugs in  walkways, pet beds, electrical cords, etc? {YES/NO:21197} Adequate lighting in your home to reduce risk of falls? {YES/NO:21197}  ASSISTIVE DEVICES UTILIZED TO PREVENT FALLS:  Life alert? {YES/NO:21197} Use of a cane, walker or w/c? {YES/NO:21197} Grab bars in the bathroom? {YES/NO:21197} Shower chair or bench in shower? {YES/NO:21197} Elevated toilet seat or a handicapped toilet? {YES/NO:21197}  TIMED UP AND GO:  Was the test performed? No . Phone visit  Cognitive Function:    02/26/2015    2:30 PM  MMSE - Mini Mental State Exam  Orientation to time 5  Orientation to Place 5        Immunizations Immunization History  Administered Date(s) Administered   Tdap 07/23/2014    TDAP status: Up to date  Flu vaccine status: Due-01/2022  Pneumococcal vaccine status:Due at age 28  {Covid-19 vaccine status:2101808}  Qualifies for Shingles Vaccine? Yes   Zostavax completed No   Shingrix Completed?: No.    Education has been provided regarding the importance of this vaccine. Patient has been advised to call insurance company to determine out of pocket expense if they have not yet received this vaccine. Advised may also receive vaccine at local pharmacy or Health Dept. Verbalized acceptance and understanding.  Screening Tests Health Maintenance  Topic Date Due   HIV Screening  Never done   Hepatitis C Screening  Never done   Zoster Vaccines- Shingrix (1 of 2) Never done   COLONOSCOPY (Pts 45-72yrs Insurance coverage will need to be confirmed)  Never done   INFLUENZA VACCINE  12/23/2021   TETANUS/TDAP  07/22/2024   HPV  VACCINES  Aged Out    Health Maintenance  Health Maintenance Due  Topic Date Due   HIV Screening  Never done   Hepatitis C Screening  Never done   Zoster Vaccines- Shingrix (1 of 2) Never done   COLONOSCOPY (Pts 45-40yrs Insurance coverage will need to be confirmed)  Never done    {Colorectal cancer screening:2101809}  Lung Cancer Screening: (Low Dose CT Chest recommended if Age 42-80 years, 30 pack-year currently smoking OR have quit w/in 15years.) {DOES NOT does:27190::"does not"} qualify.   Lung Cancer Screening Referral: ***  Additional Screening:  Hepatitis C Screening: does qualify; Discuss with PCP  Vision Screening: Recommended annual ophthalmology exams for early detection of glaucoma and other disorders of the eye. Is the patient up to date with their annual eye exam?  {YES/NO:21197} Who is the provider or what is the name of the office in which the patient attends annual eye exams? *** If pt is not established with a provider, would they like to be referred to a provider to establish care? {YES/NO:21197}.   Dental Screening: Recommended annual dental exams for proper oral hygiene  Community Resource Referral / Chronic Care Management: CRR required this visit?  {YES/NO:21197}  CCM required this visit?  {YES/NO:21197}     Plan:     I have personally reviewed and noted the following in the patient's chart:   Medical and social history Use of alcohol, tobacco or illicit drugs  Current medications and supplements including opioid prescriptions. {Opioid Prescriptions:4041889467} Functional ability and status Nutritional status Physical activity Advanced directives List of other physicians Hospitalizations, surgeries, and ER visits in previous 12 months Vitals Screenings to include cognitive, depression, and falls Referrals and appointments  In addition, I have reviewed and discussed with patient certain preventive protocols, quality metrics, and best  practice recommendations. A written personalized care plan for preventive services as well as general preventive health  recommendations were provided to patient.   Due to this being a telephonic visit, the after visit summary with patients personalized plan was offered to patient via mail or my-chart. ***Patient declined at this time./ Patient would like to access on my-chart/ per request, patient was mailed a copy of AVS./ Patient preferred to pick up at office at next visit.   Roanna RaiderMartha A Jesper Stirewalt, LPN   1/6/10965/12/2021  Nurse Health Advisor  Nurse Notes: ***

## 2021-09-30 ENCOUNTER — Ambulatory Visit (INDEPENDENT_AMBULATORY_CARE_PROVIDER_SITE_OTHER): Payer: Medicare HMO

## 2021-09-30 DIAGNOSIS — Z0000 Encounter for general adult medical examination without abnormal findings: Secondary | ICD-10-CM | POA: Diagnosis not present

## 2021-09-30 NOTE — Progress Notes (Addendum)
? ?Subjective:  ? Patrick BeringRandall N Singh is a 66 y.o. male who presents for Medicare Annual/Subsequent preventive examination. ? ?I connected with  [name] on 09/30/21 by a audio enabled telemedicine application and verified that I am speaking with the correct person using two identifiers. ? ?Patient Location: Home ? ?Provider Location: Office/Clinic ? ?I discussed the limitations of evaluation and management by telemedicine. The patient expressed understanding and agreed to proceed.  ? ?Review of Systems    ? ?Cardiac Risk Factors include: advanced age (>[id number], 35>65 women) ? ?   ?Objective:  ?  ?There were no vitals filed for this visit. ?There is no height or weight on file to calculate BMI. ? ? ?  09/30/2021  ? 11:04 AM 07/25/2019  ?  8:21 AM 08/20/2018  ? 12:27 PM 10/28/2017  ?  4:14 PM 10/27/2016  ? 10:27 AM 10/15/2015  ?  2:30 PM 05/13/2015  ?  3:30 PM  ?Advanced Directives  ?Does Patient Have a Medical Advance Directive? No No No No No No No  ?Copy of Healthcare Power of Attorney in Chart?      No - copy requested   ?Would patient like information on creating a medical advance directive? No - Patient declined No - Patient declined No - Patient declined Yes (MAU/Ambulatory/Procedural Areas - Information given) Yes (MAU/Ambulatory/Procedural Areas - Information given) No - patient declined information   ? ? ?Current Medications (verified) ?Outpatient Encounter Medications as of 09/30/2021  ?Medication Sig  ? acetaminophen (TYLENOL 8 HOUR) 650 MG CR tablet Take 1 tablet (650 mg total) by mouth every 8 (eight) hours as needed for pain. Take for 10 days.  ? diclofenac Sodium (VOLTAREN) 1 % GEL Apply 2 grams topically 4 (four) times daily to affected joint.  ? methylPREDNISolone (MEDROL) 4 MG tablet Standard 6 day taper dose  ? pregabalin (LYRICA) 75 MG capsule Take 1 capsule (75 mg total) by mouth daily.  ? traMADol (ULTRAM) 50 MG tablet 1-2 tab po q 6 hours prn pain.  ? ?No facility-administered encounter medications on  file as of 09/30/2021.  ? ? ?Allergies (verified) ?Patient has no known allergies.  ? ?History: ?Past Medical History:  ?Diagnosis Date  ? Anxiety   ? Anxiety and depression 03/14/2010  ? Colonic inertia   ? with chronic lifelong costipation  ? Congenital fusion of cervical spine   ? Depression   ? FECAL INCONTINENCE 08/19/2007  ? H/O: GI bleed   ? Heart murmur   ? early childhood   ? Hydrocephalus --per CT 04/2014 07/23/2014  ? Idiopathic scoliosis   ? 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 Pneumonia   ? childhood   ? ?Past Surgical History:  ?Procedure Laterality Date  ? COLONOSCOPY  11/08/2006  ? HARDWARE REMOVAL Right 10/23/2015  ? Procedure: RIGHT KNEE HARDWARE REMOVAL;  Surgeon: Ollen GrossFrank Aluisio, MD;  Location: [facility];  Service: Orthopedics;  Laterality: Right;  LMA  ? HERNIA REPAIR  1980  ? left inguinal hernia  ? LAPAROTOMY  07/18/2011  ? Procedure: EXPLORATORY LAPAROTOMY;  Surgeon: Mariella SaaBenjamin T Hoxworth, MD;  Location: [facility];  Service: General;  Laterality: N/A;  lysis of adhesions entero enterostomy  ? LAPAROTOMY  07/28/2011  ? Procedure: EXPLORATORY LAPAROTOMY;  Surgeon: Mariella SaaBenjamin T Hoxworth, MD;  Location: [facility];  Service: General;  Laterality: N/A;  ? Left Knee surgery  1994, 1992  ? ORIF PATELLA Right 12/14/2014  ? Procedure: OPEN REDUCTION INTERNAL (ORIF) FIXATION PATELLA;  Surgeon: Ollen GrossFrank Aluisio, MD;  Location: [facility];  Service: Orthopedics;  Laterality: Right;  ? Surgery for imperforate anus  1958  ? TONSILLECTOMY    ? UPPER GASTROINTESTINAL ENDOSCOPY  05/15/03  ? ?Family History  ?Problem Relation Age of Onset  ? Alzheimer's disease Father   ? Diabetes Mother   ? Cancer Mother   ? Colon cancer Neg Hx   ? Prostate cancer Neg Hx   ? ?Social History  ? ?Socioeconomic History  ? Marital status: Single  ?  Spouse name: Not on file  ? Number of children: 0  ? Years of education: Not on file  ? Highest education level: Not on file  ?Occupational History  ? 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 Smoking status: Never  ? Smokeless tobacco: Never  ?Vaping Use  ? Vaping Use: Never used  ?Substance and Sexual Activity  ? Alcohol use: No  ?  Alcohol/week: 0.0 standard drinks  ? Drug use: No  ? Sexual activity: Yes  ?Other Topics Concern  ? Not on file  ?Social History Narrative  ? Lives w/ mother , has a younger brother and sister   ? ?Social Determinants of Health  ? ?Financial Resource Strain: Low Risk   ? Difficulty of Paying Living Expenses: Not hard at all  ?Food Insecurity: No Food Insecurity  ? Worried About Programme researcher, broadcasting/film/video in the Last Year: Never true  ? Ran Out of Food in the Last Year: Never true  ?Transportation Needs: No Transportation Needs  ? Lack of Transportation (Medical): No  ? Lack of Transportation (Non-Medical): No  ?Physical Activity: Sufficiently Active  ? Days of Exercise per Week: 7 days  ? Minutes of Exercise per Session: 60 min  ?Stress: No Stress Concern Present  ? Feeling of Stress : Not at all  ?Social Connections: Socially Integrated  ? Frequency of Communication with Friends and Family: More than three times a week  ? Frequency of Social Gatherings with Friends and Family: More than three times a week  ? Attends Religious Services: More than 4 times per year  ? Active Member of Clubs or Organizations: Yes  ? Attends Banker Meetings: More than 4 times per year  ? Marital Status: Married  ? ? ?Tobacco Counseling ?Counseling given: Not Answered ? ? ?Clinical Intake: ? ?Pre-visit preparation completed: Yes ? ?Pain : No/denies pain ? ?  ? ?Nutritional Risks: None ?Diabetes: No ? ?How often do you need to have someone help you when you read instructions, pamphlets, or other written materials from your doctor or pharmacy?: 1 - Never ? ?Diabetic?No ? ?Interpreter Needed?: No ? ?Information entered by :: Jasmynn Pfalzgraf ? ? ?Activities of Daily Living ? ?  09/30/2021  ? 11:06 AM  ?In your present state of health, do you have any difficulty performing the following  activities:  ?Hearing? 0  ?Vision? 0  ?Difficulty concentrating or making decisions? 0  ?Walking or climbing stairs? 0  ?Dressing or bathing? 0  ?Doing errands, shopping? 0  ?Preparing Food and eating ? N  ?Using the Toilet? N  ?In the past six months, have you accidently leaked urine? N  ?Do you have problems with loss of bowel control? N  ?Managing your Medications? N  ?Managing your Finances? N  ?Housekeeping or managing your Housekeeping? N  ? ? ?Patient Care Team: ?Saguier, Kateri Mc as PCP - General (Physician Assistant) ? ?Indicate any recent Medical Services you may have  received from other than [facility] providers in the past year (date may be approximate). ? ?   ?Assessment:  ? This is a routine wellness examination for Deanglo. ? ?Hearing/Vision screen ?No results found. ? ?Dietary issues and exercise activities discussed: ?Current Exercise Habits: Home exercise routine, Type of exercise: walking;stretching, Time (Minutes): 60, Frequency (Times/Week): 7, Weekly Exercise (Minutes/Week): 420, Intensity: Mild ? ? Goals Addressed   ?None ?  ? ?Depression Screen ? ?  09/30/2021  ? 11:04 AM 10/02/2020  ?  9:47 AM 10/28/2017  ?  4:14 PM 05/31/2017  ? 12:00 PM 10/27/2016  ? 10:27 AM  ?PHQ 2/9 Scores  ?PHQ - 2 Score 0 0 0 0 0  ?  ?Fall Risk ? ?  09/30/2021  ? 11:04 AM 10/28/2017  ?  4:14 PM 05/31/2017  ? 12:00 PM 10/27/2016  ? 10:27 AM 06/28/2015  ?  2:06 PM  ?Fall Risk   ?Falls in the past year? 0 No No No No  ?Number falls in past yr: 0      ?Injury with Fall? 0      ?Risk for fall due to : No Fall Risks      ?Follow up Falls evaluation completed      ? ? ?FALL RISK PREVENTION PERTAINING TO THE HOME: ? ?Any stairs in or around the home? No  ?If so, are there any without handrails?  N/A ?Home free of loose throw rugs in walkways, pet beds, electrical cords, etc? Yes  ?Adequate lighting in your home to reduce risk of falls? Yes  ? ?ASSISTIVE DEVICES UTILIZED TO PREVENT FALLS: ? ?Life alert? No  ?Use of a cane, walker or w/c? No  ?Grab  bars in the bathroom? Yes  ?Shower chair or bench in shower? Yes  ?Elevated toilet seat or a handicapped toilet? No  ? ?TIMED UP AND GO: ? ?Was the test performed? No .  ? ? ?Cognitive Function: ? ?  02/26/19

## 2021-09-30 NOTE — Patient Instructions (Signed)
[name] , ?Thank you for taking time to come for your Medicare Wellness Visit. I appreciate your ongoing commitment to your health goals. Please review the following plan we discussed and let me know if I can assist you in the future.  ? ?Screening recommendations/referrals: ?Colonoscopy: declined ?Recommended yearly ophthalmology/optometry visit for glaucoma screening and checkup ?Recommended yearly dental visit for hygiene and checkup ? ?Vaccinations: ?Influenza vaccine: Due-May obtain vaccine at our office or your local pharmacy.  ?Pneumococcal vaccine: Due-May obtain vaccine at our office or your local pharmacy.  ?Tdap vaccine: up to date ?Shingles vaccine: Due-May obtain vaccine at our office or your local pharmacy.    ?Covid-19: Due-May obtain vaccine at our office or your local pharmacy.  ? ?Advanced directives: no ? ?Conditions/risks identified: see problem list ? ?Next appointment: Follow up in one year for your annual wellness visit  ? ?Preventive Care 40-64 Years, Male ?Preventive care refers to lifestyle choices and visits with your health care provider that can promote health and wellness. ?What does preventive care include? ?A yearly physical exam. This is also called an annual well check. ?Dental exams once or twice a year. ?Routine eye exams. Ask your health care provider how often you should have your eyes checked. ?Personal lifestyle choices, including: ?Daily care of your teeth and gums. ?Regular physical activity. ?Eating a healthy diet. ?Avoiding tobacco and drug use. ?Limiting alcohol use. ?Practicing safe sex. ?Taking low-dose aspirin every day starting at age 59. ?What happens during an annual well check? ?The services and screenings done by your health care provider during your annual well check will depend on your age, overall health, lifestyle risk factors, and family history of disease. ?Counseling  ?Your health care provider may ask you questions about your: ?Alcohol use. ?Tobacco  use. ?Drug use. ?Emotional well-being. ?Home and relationship well-being. ?Sexual activity. ?Eating habits. ?Work and work Astronomer. ?Screening  ?You may have the following tests or measurements: ?Height, weight, and BMI. ?Blood pressure. ?Lipid and cholesterol levels. These may be checked every 5 years, or more frequently if you are over 41 years old. ?Skin check. ?Lung cancer screening. You may have this screening every year starting at age 35 if you have a 30-pack-year history of smoking and currently smoke or have quit within the past 15 years. ?Fecal occult blood test (FOBT) of the stool. You may have this test every year starting at age 61. ?Flexible sigmoidoscopy or colonoscopy. You may have a sigmoidoscopy every 5 years or a colonoscopy every 10 years starting at age 24. ?Prostate cancer screening. Recommendations will vary depending on your family history and other risks. ?Hepatitis C blood test. ?Hepatitis B blood test. ?Sexually transmitted disease (STD) testing. ?Diabetes screening. This is done by checking your blood sugar (glucose) after you have not eaten for a while (fasting). You may have this done every 1-3 years. ?Discuss your test results, treatment options, and if necessary, the need for more tests with your health care provider. ?Vaccines  ?Your health care provider may recommend certain vaccines, such as: ?Influenza vaccine. This is recommended every year. ?Tetanus, diphtheria, and acellular pertussis (Tdap, Td) vaccine. You may need a Td booster every 10 years. ?Zoster vaccine. You may need this after age 107. ?Pneumococcal 13-valent conjugate (PCV13) vaccine. You may need this if you have certain conditions and have not been vaccinated. ?Pneumococcal polysaccharide (PPSV23) vaccine. You may need one or two doses if you smoke cigarettes or if you have certain conditions. ?Talk to your health care  provider about which screenings and vaccines you need and how often you need them. ?This  information is not intended to replace advice given to you by your health care provider. Make sure you discuss any questions you have with your health care provider. ?Document Released: 06/07/2015 Document Revised: 01/29/2016 Document Reviewed: 03/12/2015 ?Elsevier Interactive Patient Education ? 2017 [employer]. ? ?Fall Prevention in the Home ?Falls can cause injuries. They can happen to people of all ages. There are many things you can do to make your home safe and to help prevent falls. ?What can I do on the outside of my home? ?Regularly fix the edges of walkways and driveways and fix any cracks. ?Remove anything that might make you trip as you walk through a door, such as a raised step or threshold. ?Trim any bushes or trees on the path to your home. ?Use bright outdoor lighting. ?Clear any walking paths of anything that might make someone trip, such as rocks or tools. ?Regularly check to see if handrails are loose or broken. Make sure that both sides of any steps have handrails. ?Any raised decks and porches should have guardrails on the edges. ?Have any leaves, snow, or ice cleared regularly. ?Use sand or salt on walking paths during winter. ?Clean up any spills in your garage right away. This includes oil or grease spills. ?What can I do in the bathroom? ?Use night lights. ?Install grab bars by the toilet and in the tub and shower. Do not use towel bars as grab bars. ?Use non-skid mats or decals in the tub or shower. ?If you need to sit down in the shower, use a plastic, non-slip stool. ?Keep the floor dry. Clean up any water that spills on the floor as soon as it happens. ?Remove soap buildup in the tub or shower regularly. ?Attach bath mats securely with double-sided non-slip rug tape. ?Do not have throw rugs and other things on the floor that can make you trip. ?What can I do in the bedroom? ?Use night lights. ?Make sure that you have a light by your bed that is easy to reach. ?Do not use any sheets or  blankets that are too big for your bed. They should not hang down onto the floor. ?Have a firm chair that has side arms. You can use this for support while you get dressed. ?Do not have throw rugs and other things on the floor that can make you trip. ?What can I do in the kitchen? ?Clean up any spills right away. ?Avoid walking on wet floors. ?Keep items that you use a lot in easy-to-reach places. ?If you need to reach something above you, use a strong step stool that has a grab bar. ?Keep electrical cords out of the way. ?Do not use floor polish or wax that makes floors slippery. If you must use wax, use non-skid floor wax. ?Do not have throw rugs and other things on the floor that can make you trip. ?What can I do with my stairs? ?Do not leave any items on the stairs. ?Make sure that there are handrails on both sides of the stairs and use them. Fix handrails that are broken or loose. Make sure that handrails are as long as the stairways. ?Check any carpeting to make sure that it is firmly attached to the stairs. Fix any carpet that is loose or worn. ?Avoid having throw rugs at the top or bottom of the stairs. If you do have throw rugs, attach them to the  floor with carpet tape. ?Make sure that you have a light switch at the top of the stairs and the bottom of the stairs. If you do not have them, ask someone to add them for you. ?What else can I do to help prevent falls? ?Wear shoes that: ?Do not have high heels. ?Have rubber bottoms. ?Are comfortable and fit you well. ?Are closed at the toe. Do not wear sandals. ?If you use a stepladder: ?Make sure that it is fully opened. Do not climb a closed stepladder. ?Make sure that both sides of the stepladder are locked into place. ?Ask someone to hold it for you, if possible. ?Clearly mark and make sure that you can see: ?Any grab bars or handrails. ?First and last steps. ?Where the edge of each step is. ?Use tools that help you move around (mobility aids) if they are  needed. These include: ?Canes. ?Walkers. ?Scooters. ?Crutches. ?Turn on the lights when you go into a dark area. Replace any light bulbs as soon as they burn out. ?Set up your furniture so you have a clear path. A

## 2021-10-01 DIAGNOSIS — M25611 Stiffness of right shoulder, not elsewhere classified: Secondary | ICD-10-CM | POA: Diagnosis not present

## 2021-10-01 DIAGNOSIS — M6281 Muscle weakness (generalized): Secondary | ICD-10-CM | POA: Diagnosis not present

## 2021-10-01 DIAGNOSIS — M25511 Pain in right shoulder: Secondary | ICD-10-CM | POA: Diagnosis not present

## 2021-10-02 DIAGNOSIS — Z79891 Long term (current) use of opiate analgesic: Secondary | ICD-10-CM | POA: Diagnosis not present

## 2021-10-02 DIAGNOSIS — M1990 Unspecified osteoarthritis, unspecified site: Secondary | ICD-10-CM | POA: Diagnosis not present

## 2021-10-02 DIAGNOSIS — Z008 Encounter for other general examination: Secondary | ICD-10-CM | POA: Diagnosis not present

## 2021-10-20 ENCOUNTER — Other Ambulatory Visit: Payer: Self-pay | Admitting: Medical

## 2021-10-20 DIAGNOSIS — M542 Cervicalgia: Secondary | ICD-10-CM

## 2021-10-21 NOTE — Telephone Encounter (Addendum)
Requesting: tramadol Contract: 08/19/21 UDS: 08/19/21 Last Visit:09/26/21 Next Visit: n/a Last Refill:09/18/21  Please Advise   Rx refill sent to pt pharmacy.  Esperanza Richters, PA-C

## 2021-11-23 ENCOUNTER — Other Ambulatory Visit: Payer: Self-pay | Admitting: Medical

## 2021-11-23 DIAGNOSIS — M542 Cervicalgia: Secondary | ICD-10-CM

## 2021-11-24 NOTE — Telephone Encounter (Addendum)
Requesting: tramadol Contract:08/14/21 UDS:08/14/21 Last Visit:09/26/21 Next Visit:n/a Last Refill:10/21/21  Please Advise   Refill rx. Please have him follow up in office September 2023.  Esperanza Richters, PA-C

## 2021-12-31 ENCOUNTER — Telehealth: Payer: Self-pay | Admitting: Medical

## 2021-12-31 NOTE — Telephone Encounter (Signed)
Patient states he called his gastroenterologist and they advised him that they wouldn't be able to see him for another 4 weeks. He would like to know if he can be referred to a place that he can be seen sooner. Please advise.

## 2022-01-01 NOTE — Telephone Encounter (Signed)
Called pt made appt for tomorrow to discuss Gi problems

## 2022-01-02 ENCOUNTER — Ambulatory Visit (INDEPENDENT_AMBULATORY_CARE_PROVIDER_SITE_OTHER): Payer: Medicare HMO | Admitting: Medical

## 2022-01-02 ENCOUNTER — Ambulatory Visit (HOSPITAL_BASED_OUTPATIENT_CLINIC_OR_DEPARTMENT_OTHER)
Admission: RE | Admit: 2022-01-02 | Discharge: 2022-01-02 | Disposition: A | Discharge status: Home / Self Care | Payer: Medicare HMO | Source: Ambulatory Visit | Attending: Medical | Admitting: Medical

## 2022-01-02 VITALS — BP 105/72 | HR 60 | Temp 98.2°F | Resp 16 | Ht 67.0 in | Wt 111.4 lb

## 2022-01-02 DIAGNOSIS — R42 Dizziness and giddiness: Secondary | ICD-10-CM | POA: Diagnosis not present

## 2022-01-02 DIAGNOSIS — R5383 Other fatigue: Secondary | ICD-10-CM | POA: Diagnosis not present

## 2022-01-02 DIAGNOSIS — R195 Other fecal abnormalities: Secondary | ICD-10-CM | POA: Diagnosis not present

## 2022-01-02 DIAGNOSIS — R197 Diarrhea, unspecified: Secondary | ICD-10-CM | POA: Diagnosis not present

## 2022-01-02 LAB — CBC WITH DIFFERENTIAL/PLATELET
Basophils Absolute: 0 10*3/uL (ref 0.0–0.1)
Basophils Relative: 1.2 % (ref 0.0–3.0)
Eosinophils Absolute: 0.3 10*3/uL (ref 0.0–0.7)
Eosinophils Relative: 8.1 % — ABNORMAL HIGH (ref 0.0–5.0)
HCT: 38.8 % — ABNORMAL LOW (ref 39.0–52.0)
Hemoglobin: 12.9 g/dL — ABNORMAL LOW (ref 13.0–17.0)
Lymphocytes Relative: 27.4 % (ref 12.0–46.0)
Lymphs Abs: 1.1 10*3/uL (ref 0.7–4.0)
MCHC: 33.3 g/dL (ref 30.0–36.0)
MCV: 86.8 fl (ref 78.0–100.0)
Monocytes Absolute: 0.3 10*3/uL (ref 0.1–1.0)
Monocytes Relative: 6.7 % (ref 3.0–12.0)
Neutro Abs: 2.2 10*3/uL (ref 1.4–7.7)
Neutrophils Relative %: 56.6 % (ref 43.0–77.0)
Platelets: 231 10*3/uL (ref 150.0–400.0)
RBC: 4.47 Mil/uL (ref 4.22–5.81)
RDW: 14.3 % (ref 11.5–15.5)
WBC: 4 10*3/uL (ref 4.0–10.5)

## 2022-01-02 LAB — COMPREHENSIVE METABOLIC PANEL
ALT: 15 U/L (ref 0–53)
AST: 20 U/L (ref 0–37)
Albumin: 4.3 g/dL (ref 3.5–5.2)
Alkaline Phosphatase: 65 U/L (ref 39–117)
BUN: 12 mg/dL (ref 6–23)
CO2: 30 mEq/L (ref 19–32)
Calcium: 9 mg/dL (ref 8.4–10.5)
Chloride: 104 mEq/L (ref 96–112)
Creatinine, Ser: 0.97 mg/dL (ref 0.40–1.50)
GFR: 82.21 mL/min (ref >60.00)
Glucose, Bld: 80 mg/dL (ref 70–99)
Potassium: 3.7 mEq/L (ref 3.5–5.1)
Sodium: 139 mEq/L (ref 135–145)
Total Bilirubin: 0.4 mg/dL (ref 0.2–1.2)
Total Protein: 6.2 g/dL (ref 6.0–8.3)

## 2022-01-02 NOTE — Progress Notes (Signed)
Subjective:    Patient ID: [name], male    DOB: Oct 21, 1955, 66 y.o.   MRN: [id number]  HPI  Pt in for follow up.  Pt state he is trying to get in with GI. Pt sees [facility]. He has recent loose stools no matter what he eats.   No vomiting. No abdomen pain. He thinks he is not constipated.  Similar to previous last episode.   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 He was last seen in clinic in July. Colonoscopy was completed 12/29/2018 with poor prep, but no obvious lesions.   He has failure to thrive. He is very bothered by the prolapse. He also has diarrhea, inability to gain weight, low energy, and overwhelmed sense of self. He very much desires ileostomy."   Pt wanting to get back in [facility] quicker. He states he missed his December 31, 2021.       Prior GI Plan  - Plan for laparoscopic, possible open loop ileostomy with EUA on 02/24/2019 - consent obtained   Pt backed out of this procedure.  He did have hx of bowel obstruction in past in 2013.  He states he is purposely not eating much to avoid diarrhea. Pt is at times feeling light head and dizzy since reduced intake. Last time was 2 days ago.  He states loose stools have been for about 10 days. 3-5 times a day.       Pt has severe neck pain related to below. Presently pain is controlled. Recently only requiring 2 tramadol in am and 1 in pm.   IMPRESSION: 1. 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 C6 is virtually the only normally formed cervical vertebrae (although demonstrates spina bifida occulta). No acute osseous abnormality in the spine.   2. 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 Mild spinal stenosis at both levels with up to mild spinal cord mass effect. Moderate to severe C6 neural foraminal stenosis  suspected although better depicted on the January MRI.   3. C1 level spinal stenosis with up to mild cord mass effect, and other mild bilateral cervical foraminal stenosis, which are unrelated to disc disease.   In the past opted for tramadol for pain. Have avoid stronger narcotics as not to contipation or possible cause obstruction.     Pt states for last 3 days he has been having loose stools every time he eats.     Review of Systems  Constitutional:  Negative for chills, diaphoresis and fatigue.  HENT:  Negative for congestion, drooling and ear discharge.   Respiratory:  Negative for cough, chest tightness, shortness of breath and wheezing.   Cardiovascular:  Negative for chest pain and palpitations.  Gastrointestinal:  Positive for diarrhea. Negative for abdominal pain, anal bleeding, constipation, nausea and vomiting.  Genitourinary:  Negative for dysuria, enuresis, flank pain, genital sores and penile pain.  Musculoskeletal:  Negative for back pain and gait problem.    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   Pneumonia    childhood      Social History   Socioeconomic History   Marital status: Single    Spouse name: Not on file   Number of children: 0   Years of education: Not on file   Highest education level: Not on file  Occupational History   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  Tobacco Use   Smoking status: Never   Smokeless tobacco: Never  Vaping Use   Vaping Use: Never used  Substance and Sexual Activity   Alcohol use: No    Alcohol/week: 0.0 standard drinks of alcohol   Drug use: No   Sexual  activity: Yes  Other Topics Concern   Not on file  Social History Narrative   Lives w/ mother , has a younger brother and sister    Social Determinants of Health   Financial Resource Strain: Low Risk  (09/30/2021)   Overall Financial Resource Strain (CARDIA)    Difficulty of Paying Living Expenses: Not hard at all  Food Insecurity: No Food Insecurity (09/30/2021)   Hunger Vital Sign    Worried About Running Out of Food in the Last Year: Never true    Ran Out of Food in the Last Year: Never true  Transportation Needs: No Transportation Needs (09/30/2021)   PRAPARE - Administrator, Civil Service (Medical): No    Lack of Transportation (Non-Medical): No  Physical Activity: Sufficiently Active (09/30/2021)   Exercise Vital Sign    Days of Exercise per Week: 7 days    Minutes of Exercise per Session: 60 min  Stress: No Stress Concern Present (09/30/2021)   [employer] of Occupational Health - Occupational Stress Questionnaire    Feeling of Stress : Not at all  Social Connections: Socially Integrated (09/30/2021)   Social Connection and Isolation Panel [NHANES]    Frequency of Communication with Friends and Family: More than three times a week    Frequency of Social Gatherings with Friends and Family: More than three times a week    Attends Religious Services: More than 4 times per year    Active Member of Clubs or Organizations: Yes    Attends Banker Meetings: More than 4 times per year    Marital Status: Married  Catering manager Violence: Not At Risk (09/30/2021)   Humiliation, Afraid, Rape, and Kick questionnaire    Fear of Current or Ex-Partner: No    Emotionally Abused: No    Physically Abused: No    Sexually Abused: No    Past Surgical History:  Procedure Laterality Date   COLONOSCOPY  11/08/2006   HARDWARE REMOVAL Right 10/23/2015   Procedure: RIGHT KNEE HARDWARE REMOVAL;  Surgeon: Ollen Gross, MD;  Location: [facility];  Service: Orthopedics;   Laterality: Right;  LMA   HERNIA REPAIR  1980   left inguinal hernia   LAPAROTOMY  07/18/2011   Procedure: EXPLORATORY LAPAROTOMY;  Surgeon: Mariella Saa, MD;  Location: [facility];  Service: General;  Laterality: N/A;  lysis of adhesions entero enterostomy   LAPAROTOMY  07/28/2011   Procedure: EXPLORATORY LAPAROTOMY;  Surgeon: Mariella Saa, MD;  Location: [facility];  Service: General;  Laterality: N/A;   Left Knee surgery  1994, 1992   ORIF PATELLA Right 12/14/2014   Procedure: OPEN REDUCTION INTERNAL (ORIF) FIXATION PATELLA;  Surgeon: Ollen Gross, MD;  Location: [facility];  Service: Orthopedics;  Laterality: Right;   Surgery for imperforate anus  1958   TONSILLECTOMY     UPPER GASTROINTESTINAL ENDOSCOPY  05/15/03    Family History  Problem Relation Age of Onset   Alzheimer's disease Father    Diabetes Mother    Cancer Mother    Colon cancer Neg Hx    Prostate cancer Neg Hx     No Known Allergies  Current Outpatient Medications on File Prior to Visit  Medication Sig Dispense Refill   acetaminophen (TYLENOL 8 HOUR) 650 MG CR tablet Take 1 tablet (650 mg total) by mouth every 8 (eight) hours as needed for pain. Take for 10 days. 100 tablet 0   diclofenac Sodium (VOLTAREN) 1 % GEL Apply 2 grams topically 4 (four) times daily to affected joint. 100 g 11   methylPREDNISolone (MEDROL) 4 MG tablet Standard 6 day taper dose 21 tablet 0   pregabalin (LYRICA) 75 MG capsule Take 1 capsule (75 mg total) by mouth daily. 30 capsule 1   traMADol (ULTRAM) 50 MG tablet TAKE ONE TO TWO TABLETS BY MOUTH BY MOUTH EVERY 6 HOURS AS NEEDED FOR PAIN *DO NOT EXCEED 4 AND 1/2 TABLETS DAILY* 135 tablet 1   No current facility-administered medications on file prior to visit.    BP 105/72 (BP Location: Right Arm, Patient Position: Sitting, Cuff Size: Normal)   Pulse 60   Temp 98.2 F (36.8 C) (Oral)   Resp 16   Ht 5\' 7"  (1.702 m)   Wt 111 lb 6.4 oz (50.5 kg)   SpO2 100%   BMI 17.45 kg/m         Objective:   Physical Exam  General Mental Status- Alert. General Appearance- Not in acute distress.    Skin General: Color- Normal Color. Moisture- Normal Moisture.   Neck Carotid Arteries- Normal color. Moisture- Normal Moisture. No carotid bruits. No JVD.   Chest and Lung Exam Auscultation: Breath Sounds:-Normal.   Cardiovascular Auscultation:Rythm- Regular. Murmurs & Other Heart Sounds:Auscultation of the heart reveals- No Murmurs.   Abdomen Inspection:-Inspeection Normal. Palpation/Percussion:Note:No mass. Palpation and Percussion of the abdomen reveal- Non Tender, Non Distended + BS, no rebound or guarding. Scars present on abdomen     Neurologic Cranial Nerve exam:- CN III-XII intact(No nystagmus), symmetric smile. Strength:- 5/5 equal and symmetric strength both upper and lower extremities.      Assessment & Plan:   Patient Instructions  Loose stools for 10 day. With your gi history do have concerns for leaking stools around possible hard/large impaction or some obstruction. Will get abd xray to evaluate.  Will put in referral to your GI asking if they can you within a week/asap.  Can continue immodium. But if xray indicated high stool burden they may recommend me  to promote large bowl movement.   If indicate of bowel obstruction on imaging may have to advise ED or order additoinal imaging.  Some concern with decreased intake you may be dehydrated as you describe fatigue and occasional dizziness. Will get stat cbc and cmp.  Your neck pain is controlled. Tramadol can decrease bowel motility. Might ask you to skip a day if possible. But want to evaluate xray first.  Follow up 5-7 days or sooner if needed.   , PA-C

## 2022-01-02 NOTE — Addendum Note (Signed)
Addended by: Gwenevere Abbot on: 01/02/2022 05:19 PM   Modules accepted: Orders

## 2022-01-02 NOTE — Patient Instructions (Addendum)
Loose stools for 10 day. With your gi history do have concerns for leaking stools around possible hard/large impaction or some obstruction. Will get abd xray to evaluate.  Will put in referral to your GI asking if they can you within a week/asap.  Can continue immodium. But if xray indicated high stool burden they may recommend me  to promote large bowl movement.   If indicate of bowel obstruction on imaging may have to advise ED or order additoinal imaging.  Some concern with decreased intake you may be dehydrated as you describe fatigue and occasional dizziness. Will get stat cbc and cmp.  Your neck pain is controlled. Tramadol can decrease bowel motility. Might ask you to skip a day if possible. But want to evaluate xray first.  Presently do not think infectious cause of loose stools.(If were to worsen then get stool o and p)  Follow up 5-7 days or sooner if needed.

## 2022-01-20 ENCOUNTER — Encounter: Payer: Self-pay | Admitting: Family Medicine

## 2022-01-20 ENCOUNTER — Ambulatory Visit (INDEPENDENT_AMBULATORY_CARE_PROVIDER_SITE_OTHER): Payer: Medicare HMO | Admitting: Family Medicine

## 2022-01-20 VITALS — BP 108/70 | HR 77 | Temp 98.0°F | Resp 16 | Ht 67.0 in | Wt 116.4 lb

## 2022-01-20 DIAGNOSIS — M25562 Pain in left knee: Secondary | ICD-10-CM

## 2022-01-20 DIAGNOSIS — G8929 Other chronic pain: Secondary | ICD-10-CM | POA: Diagnosis not present

## 2022-01-20 NOTE — Patient Instructions (Signed)
Acute Knee Pain, Adult Acute knee pain is sudden and may be caused by damage, swelling, or irritation of the muscles and tissues that support the knee. Pain may result from: A fall. An injury to the knee from twisting motions. A hit to the knee. Infection. Acute knee pain may go away on its own with time and rest. If it does not, your health care provider may order tests to find the cause of the pain. These may include: Imaging tests, such as an X-ray, MRI, CT scan, or ultrasound. Joint aspiration. In this test, fluid is removed from the knee and evaluated. Arthroscopy. In this test, a lighted tube is inserted into the knee and an image is projected onto a TV screen. Biopsy. In this test, a sample of tissue is removed from the body and studied under a microscope. Follow these instructions at home: If you have a knee sleeve or brace:  Wear the knee sleeve or brace as told by your health care provider. Remove it only as told by your health care provider. Loosen it if your toes tingle, become numb, or turn cold and blue. Keep it clean. If the knee sleeve or brace is not waterproof: Do not let it get wet. Cover it with a watertight covering when you take a bath or shower. Activity Rest your knee. Do not do things that cause pain or make pain worse. Avoid high-impact activities or exercises, such as running, jumping rope, or doing jumping jacks. Work with a physical therapist to make a safe exercise program, as recommended by your health care provider. Do exercises as told by your physical therapist. Managing pain, stiffness, and swelling  If directed, put ice on the affected knee. To do this: If you have a removable knee sleeve or brace, remove it as told by your health care provider. Put ice in a plastic bag. Place a towel between your skin and the bag. Leave the ice on for 20 minutes, 2-3 times a day. Remove the ice if your skin turns bright red. This is very important. If you cannot  feel pain, heat, or cold, you have a greater risk of damage to the area. If directed, use an elastic bandage to put pressure (compression) on your injured knee. This may control swelling, give support, and help with discomfort. Raise (elevate) your knee above the level of your heart while you are sitting or lying down. Sleep with a pillow under your knee. General instructions Take over-the-counter and prescription medicines only as told by your health care provider. Do not use any products that contain nicotine or tobacco, such as cigarettes, e-cigarettes, and chewing tobacco. If you need help quitting, ask your health care provider. If you are overweight, work with your health care provider and a dietitian to set a weight-loss goal that is healthy and reasonable for you. Extra weight can put pressure on your knee. Pay attention to any changes in your symptoms. Keep all follow-up visits. This is important. Contact a health care provider if: Your knee pain continues, changes, or gets worse. You have a fever along with knee pain. Your knee feels warm to the touch or is red. Your knee buckles or locks up. Get help right away if: Your knee swells, and the swelling becomes worse. You cannot move your knee. You have severe pain in your knee that cannot be managed with pain medicine. Summary Acute knee pain can be caused by a fall, an injury, an infection, or damage, swelling, or irritation   of the tissues that support your knee. Your health care provider may perform tests to find out the cause of the pain. Pay attention to any changes in your symptoms. Relieve your pain with rest, medicines, light activity, and the use of ice. Get help right away if your knee swells, you cannot move your knee, or you have severe pain that cannot be managed with medicine. This information is not intended to replace advice given to you by your health care provider. Make sure you discuss any questions you have with  your health care provider. Document Revised: 10/25/2019 Document Reviewed: 10/25/2019 Elsevier Patient Education  2023 [employer]  

## 2022-01-20 NOTE — Progress Notes (Signed)
Established Patient Office Visit  Subjective   Patient ID: [name], male    DOB: 29-Sep-1955  Age: 66 y.o. MRN: [id number]  Chief Complaint  Patient presents with   Knee Injury    Pt states he turned the wrong way and fell. Pt states no longer having pain or swelling.    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   Patella fracture 12/14/2014   Constipation 11/21/2014   Annual physical exam 07/23/2014   Hydrocephalus --per CT 04/2014 07/23/2014   Dizziness and giddiness 05/09/2014   Obstipation 07/16/2011   FATIGUE 07/02/2010   DYSPNEA 07/02/2010   Anxiety and depression 03/14/2010   GI BLEEDING 03/14/2010   SCOLIOSIS 03/14/2010   ARTHRITIS 08/19/2007   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   FECAL INCONTINENCE 08/19/2007   INGUINAL HERNIA, HX OF 08/19/2007   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   Pneumonia    childhood    Past Surgical History:  Procedure Laterality Date   COLONOSCOPY  11/08/2006   HARDWARE REMOVAL Right 10/23/2015   Procedure: RIGHT KNEE HARDWARE REMOVAL;  Surgeon: Ollen Gross, MD;  Location: [facility];  Service: Orthopedics;  Laterality: Right;  LMA   HERNIA REPAIR  1980   left inguinal hernia    LAPAROTOMY  07/18/2011   Procedure: EXPLORATORY LAPAROTOMY;  Surgeon: Mariella Saa, MD;  Location: [facility];  Service: General;  Laterality: N/A;  lysis of adhesions entero enterostomy   LAPAROTOMY  07/28/2011   Procedure: EXPLORATORY LAPAROTOMY;  Surgeon: Mariella Saa, MD;  Location: [facility];  Service: General;  Laterality: N/A;   Left Knee surgery  1994, 1992   ORIF PATELLA Right 12/14/2014   Procedure: OPEN REDUCTION INTERNAL (ORIF) FIXATION PATELLA;  Surgeon: Ollen Gross, MD;  Location: [facility];  Service: Orthopedics;  Laterality: Right;   Surgery for imperforate anus  1958   TONSILLECTOMY     UPPER GASTROINTESTINAL ENDOSCOPY  05/15/03   Social History   Tobacco Use   Smoking status: Never   Smokeless tobacco: Never  Vaping Use   Vaping Use: Never used  Substance Use Topics   Alcohol use: No    Alcohol/week: 0.0 standard drinks of alcohol   Drug use: No   Social History   Socioeconomic History   Marital status: Single    Spouse name: Not on file   Number of children: 0   Years of education: Not on file   Highest education level: Not on file  Occupational History   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  Tobacco Use   Smoking status: Never   Smokeless tobacco: Never  Vaping Use   Vaping Use: Never used  Substance and Sexual Activity   Alcohol use: No    Alcohol/week: 0.0 standard drinks of alcohol   Drug use: No   Sexual activity: Yes  Other Topics Concern   Not on file  Social History Narrative   Lives w/ mother , has a younger brother and sister    Social Determinants of Health   Financial Resource Strain: Low Risk  (09/30/2021)   Overall Financial Resource Strain (CARDIA)    Difficulty of Paying Living Expenses: Not hard at all  Food Insecurity: No Food Insecurity (09/30/2021)   Hunger Vital Sign    Worried About Running Out of Food in the Last Year: Never true    Ran Out of Food in the Last Year: Never true  Transportation  Needs: No Transportation Needs (09/30/2021)   PRAPARE - Administrator, Civil Service (Medical): No    Lack of Transportation (Non-Medical): No  Physical Activity: Sufficiently Active (09/30/2021)   Exercise Vital Sign    Days of Exercise per Week: 7 days    Minutes of Exercise per Session: 60 min  Stress: No Stress Concern Present (09/30/2021)   [employer] of Occupational Health - Occupational Stress Questionnaire    Feeling of Stress : Not at all  Social Connections: Socially Integrated (09/30/2021)   Social Connection and Isolation Panel [NHANES]    Frequency of Communication with Friends and Family: More than three times a week    Frequency of Social Gatherings with Friends and Family: More than three times a week    Attends Religious Services: More than 4 times per year    Active Member of [employer] or Organizations: Yes    Attends Engineer, structural: More than 4 times per year    Marital Status: Married  Catering manager Violence: Not At Risk (09/30/2021)   Humiliation, Afraid, Rape, and Kick questionnaire    Fear of Current or Ex-Partner: No    Emotionally Abused: No    Physically Abused: No    Sexually Abused: No   Family Status  Relation Name Status   Father  Deceased at age 51   Sister  Alive   Brother  Alive   Mother  Deceased   MGM  Deceased   MGF  Deceased   PGM  Deceased   PGF  Deceased   Neg Hx  (Not Specified)   Family History  Problem Relation Age of Onset   Alzheimer's disease Father    Diabetes Mother    Cancer Mother    Colon cancer Neg Hx    Prostate cancer Neg Hx    No Known Allergies    Review of Systems  Constitutional:  Negative for fever and malaise/fatigue.  HENT:  Negative for congestion.   Eyes:  Negative for blurred vision.  Respiratory:  Negative for cough and shortness of breath.   Cardiovascular:  Negative for chest pain, palpitations and leg swelling.  Gastrointestinal:  Negative for vomiting.  Musculoskeletal:   Negative for back pain and joint pain.  Skin:  Negative for rash.  Neurological:  Negative for loss of consciousness and headaches.      Objective:     BP 108/70 (BP Location: Left Arm, Patient Position: Sitting, Cuff Size: Normal)   Pulse 77   Temp 98 F (36.7 C) (Oral)   Resp 16   Ht 5\' 7"  (1.702 m)   Wt 116 lb 6.4 oz (52.8 kg)   SpO2  99%   BMI 18.23 kg/m     Physical Exam Vitals and nursing note reviewed.  Musculoskeletal:        General: Signs of injury present. No swelling or tenderness. Normal range of motion.     Right lower leg: No edema.     Left lower leg: No edema.  Psychiatric:        Mood and Affect: Mood normal.      No results found for any visits on 01/20/22.    The ASCVD Risk score (Arnett DK, et al., 2019) failed to calculate for the following reasons:   Cannot find a previous HDL lab   Cannot find a previous total cholesterol lab    Assessment & Plan:   Problem List Items Addressed This Visit   None   Return if symptoms worsen or fail to improve.    Donato Schultz, DO

## 2022-01-20 NOTE — Assessment & Plan Note (Signed)
voltaren gel  otc Pain iis now gone but pt needed a note for work  F/u prn

## 2022-02-06 ENCOUNTER — Other Ambulatory Visit: Payer: Self-pay | Admitting: Medical

## 2022-02-06 DIAGNOSIS — M542 Cervicalgia: Secondary | ICD-10-CM

## 2022-02-06 NOTE — Telephone Encounter (Signed)
Requesting: tramadol Contract:08/14/21 UDS:08/14/21 Last Visit:01/20/22 Next Visit:n/a Last Refill:11/24/21  Please Advise   Rx tramadol sent.  Esperanza Richters, PA-C

## 2022-03-20 ENCOUNTER — Other Ambulatory Visit: Payer: Self-pay | Admitting: Medical

## 2022-03-20 DIAGNOSIS — Q423 Congenital absence, atresia and stenosis of anus without fistula: Secondary | ICD-10-CM | POA: Diagnosis not present

## 2022-03-20 DIAGNOSIS — M542 Cervicalgia: Secondary | ICD-10-CM

## 2022-03-20 DIAGNOSIS — K5901 Slow transit constipation: Secondary | ICD-10-CM | POA: Diagnosis not present

## 2022-03-20 DIAGNOSIS — K622 Anal prolapse: Secondary | ICD-10-CM | POA: Diagnosis not present

## 2022-03-20 NOTE — Telephone Encounter (Signed)
Requesting: tramadol Contract: 08/14/21 UDS:08/14/21 Last Visit:01/20/22 Next Visit:n/a Last Refill:02/07/22  Please Advise   Rx refill sent to pharmacy.  Mackie Pai, PA-C

## 2022-04-06 ENCOUNTER — Encounter: Payer: Self-pay | Admitting: Podiatry

## 2022-04-06 ENCOUNTER — Ambulatory Visit: Payer: Medicare HMO | Admitting: Podiatry

## 2022-04-06 DIAGNOSIS — M79674 Pain in right toe(s): Secondary | ICD-10-CM

## 2022-04-06 DIAGNOSIS — B351 Tinea unguium: Secondary | ICD-10-CM

## 2022-04-06 DIAGNOSIS — Q828 Other specified congenital malformations of skin: Secondary | ICD-10-CM

## 2022-04-06 DIAGNOSIS — M79675 Pain in left toe(s): Secondary | ICD-10-CM

## 2022-04-06 NOTE — Progress Notes (Signed)
This patient presents to the office with chief complaint of long thick painful nails.  Patient says the nails are painful walking and wearing shoes.  This patient is unable to self treat.  This patient is unable to trim his nails since she is unable to reach his nails.  he presents to the office for preventative foot care services.  He has painful callus left forefoot.  General Appearance  Alert, conversant and in no acute stress.  Vascular  Dorsalis pedis and posterior tibial  pulses are palpable  bilaterally.  Capillary return is within normal limits  bilaterally. Temperature is within normal limits  bilaterally.  Neurologic  Senn-Weinstein monofilament wire test within normal limits  bilaterally. Muscle power within normal limits bilaterally.  Nails Thick disfigured discolored nails with subungual debris  from hallux to fifth toes bilaterally. No evidence of bacterial infection or drainage bilaterally.  Orthopedic  No limitations of motion  feet .  No crepitus or effusions noted.  No bony pathology or digital deformities noted.  Skin  normotropic skin noted bilaterally.  No signs of infections or ulcers noted.   Porokeratosis sub 4 left foot.  Onychomycosis  Nails  B/L.  Pain in right toes  Pain in left toes  Porokeratosis left foot.  Debridement of nails both feet followed trimming the nails with dremel tool.   Debride with # 15 blade.  Iatrogenic lesion due to his responding debridement of  porokeratosis left foot.  DSD applied.RTC prn   Helane Gunther DPM

## 2022-04-27 ENCOUNTER — Other Ambulatory Visit: Payer: Self-pay | Admitting: Medical

## 2022-04-27 DIAGNOSIS — M542 Cervicalgia: Secondary | ICD-10-CM

## 2022-04-27 NOTE — Telephone Encounter (Addendum)
Requesting: tramadol Contract: 08/14/21 UDS:08/14/21 Last Visit:01/20/22 Next Visit: n/a Last Refill: 03/21/22  Please Advise   Rx refill sent to pt pharmacy.  Esperanza Richters, PA-C

## 2022-05-30 ENCOUNTER — Other Ambulatory Visit: Payer: Self-pay | Admitting: Medical

## 2022-05-30 DIAGNOSIS — M542 Cervicalgia: Secondary | ICD-10-CM

## 2022-06-01 NOTE — Telephone Encounter (Signed)
Requesting: Tramadol Contract: 08/14/2021 UDS: 08/14/2021 Last Visit: 01/20/2022 Next Visit: N/A Last Refill: 04/27/2022  Please Advise

## 2022-06-02 NOTE — Telephone Encounter (Signed)
Called pt to get him scheduled for an appt. Was not able to leave a VM

## 2022-06-02 NOTE — Telephone Encounter (Signed)
Sent in refill but schedule pt for follow up within a month. His last visit was for knee pain. But he needs controlled med visit with me.

## 2022-06-18 NOTE — Telephone Encounter (Signed)
Called pt again no answer and wasn't able to leave a VM due to mailbox not being set up.

## 2022-06-22 ENCOUNTER — Ambulatory Visit (INDEPENDENT_AMBULATORY_CARE_PROVIDER_SITE_OTHER): Payer: Medicare HMO | Admitting: Medical

## 2022-06-22 ENCOUNTER — Ambulatory Visit (HOSPITAL_BASED_OUTPATIENT_CLINIC_OR_DEPARTMENT_OTHER)
Admission: RE | Admit: 2022-06-22 | Discharge: 2022-06-22 | Disposition: A | Discharge status: Home / Self Care | Payer: Medicare HMO | Source: Ambulatory Visit | Attending: Medical | Admitting: Medical

## 2022-06-22 VITALS — BP 136/76 | HR 78 | Resp 18 | Ht 67.0 in | Wt 120.0 lb

## 2022-06-22 DIAGNOSIS — M5412 Radiculopathy, cervical region: Secondary | ICD-10-CM | POA: Diagnosis not present

## 2022-06-22 DIAGNOSIS — M79644 Pain in right finger(s): Secondary | ICD-10-CM | POA: Diagnosis not present

## 2022-06-22 DIAGNOSIS — M542 Cervicalgia: Secondary | ICD-10-CM

## 2022-06-22 DIAGNOSIS — Z79899 Other long term (current) drug therapy: Secondary | ICD-10-CM

## 2022-06-22 DIAGNOSIS — S6991XA Unspecified injury of right wrist, hand and finger(s), initial encounter: Secondary | ICD-10-CM | POA: Diagnosis not present

## 2022-06-22 MED ORDER — LEVOCETIRIZINE DIHYDROCHLORIDE 5 MG PO TABS: 5.0000 mg | ORAL_TABLET | Freq: Every evening | ORAL | 0 refills | Status: DC

## 2022-06-22 MED ORDER — BENZONATATE 100 MG PO CAPS: 100.0000 mg | ORAL_CAPSULE | Freq: Three times a day (TID) | ORAL | 0 refills | Status: DC | PRN

## 2022-06-22 NOTE — Progress Notes (Addendum)
Subjective:    Patient ID: [name], male    DOB: 1956-01-11, 67 y.o.   MRN: [id number]  HPI  Pt in for follow up on chronic pain.   "Chronic neck pain with severe degenerative changes. Recent right shoulder pain and surgery. Your on tramadol contract for chronic neck pain. Surgeon gave you oxycodone postsurgery. That you were not to use both tramadol and oxycodone at the same time. Oxycodone makes you excessively drowsy and you found the tramadol worked better. However did take half a tablet for 3 and I have previously prescribed per contract. After discussion decided to prescribe you 135 tablets to last for 1 months. Instruction would be 1 to 2 tablets every 6 hours as needed severe pain. Not to exceed 4-1/2 tablet within 24 hours period. Recommend spreading out tablets every 6 hours as you report when you waited 12 hours pain was worse. "  Continue with just tramadol. He states is adequate. Updating contract and uds. Will be due for refill Jul 03, 2022.   Pt states he has been with dry cough for 2 weeks. He notes when he lies down. No gerd. He has not taken anything for this. No pnd, runny nose or sneezing. No wheezing.   Also rt thumb decreased flexion. States som pain at base thumb and dip area. He states 2 weeks ago he felt twinge pain when he was opening up trunk of his car     Review of Systems  Constitutional:  Negative for chills, fatigue and fever.  HENT:  Negative for congestion.   Respiratory:  Positive for cough. Negative for chest tightness, shortness of breath and wheezing.   Cardiovascular:  Negative for chest pain and palpitations.  Gastrointestinal:  Negative for abdominal pain, blood in stool, constipation, diarrhea and nausea.  Genitourinary:  Negative for dysuria, flank pain, frequency, penile pain and urgency.  Musculoskeletal:  Positive for neck pain.       Thumb pain and decreased rom.   Neurological:  Negative for dizziness, weakness, numbness and  headaches.   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   Pneumonia    childhood      Social History   Socioeconomic History   Marital status: Single    Spouse name: Not on file   Number of children: 0   Years of education: Not on file   Highest education level: Not on file  Occupational History   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  Tobacco Use   Smoking status: Never   Smokeless tobacco: Never  Vaping Use   Vaping Use: Never used  Substance and Sexual Activity   Alcohol use: No    Alcohol/week: 0.0 standard drinks of alcohol   Drug use: No   Sexual activity: Yes  Other Topics Concern   Not on file  Social History Narrative   Lives w/ mother , has a younger brother and sister    Social Determinants of Health   Financial Resource Strain: Low Risk  (09/30/2021)   Overall Financial Resource Strain (CARDIA)  Difficulty of Paying Living Expenses: Not hard at all  Food Insecurity: No Food Insecurity (09/30/2021)   Hunger Vital Sign    Worried About Running Out of Food in the Last Year: Never true    Ran Out of Food in the Last Year: Never true  Transportation Needs: No Transportation Needs (09/30/2021)   PRAPARE - Hydrologist (Medical): No    Lack of Transportation (Non-Medical): No  Physical Activity: Sufficiently Active (09/30/2021)   Exercise Vital Sign    Days of Exercise per Week: 7 days    Minutes of Exercise per Session: 60 min  Stress: No Stress Concern Present (09/30/2021)   [place]    Feeling of Stress : Not at  all  Social Connections: [place] (09/30/2021)   Social Connection and Isolation Panel [NHANES]    Frequency of Communication with Friends and Family: More than three times a week    Frequency of Social Gatherings with Friends and Family: More than three times a week    Attends Religious Services: More than 4 times per year    Active Member of Clubs or Organizations: Yes    Attends Archivist Meetings: More than 4 times per year    Marital Status: Married  Human resources officer Violence: Not At Risk (09/30/2021)   Humiliation, Afraid, Rape, and Kick questionnaire    Fear of Current or Ex-Partner: No    Emotionally Abused: No    Physically Abused: No    Sexually Abused: No    Past Surgical History:  Procedure Laterality Date   COLONOSCOPY  11/08/2006   HARDWARE REMOVAL Right 10/23/2015   Procedure: RIGHT KNEE HARDWARE REMOVAL;  Surgeon: Gaynelle Arabian, MD;  Location: [facility];  Service: Orthopedics;  Laterality: Right;  LMA   HERNIA REPAIR  1980   left inguinal hernia   LAPAROTOMY  07/18/2011   Procedure: EXPLORATORY LAPAROTOMY;  Surgeon:  Jolly, MD;  Location: [facility];  Service: General;  Laterality: N/A;  lysis of adhesions entero enterostomy   LAPAROTOMY  07/28/2011   Procedure: EXPLORATORY LAPAROTOMY;  Surgeon:  Jolly, MD;  Location: [facility];  Service: General;  Laterality: N/A;   Left Knee surgery  1994, 1992   ORIF PATELLA Right 12/14/2014   Procedure: OPEN REDUCTION INTERNAL (ORIF) FIXATION PATELLA;  Surgeon: Gaynelle Arabian, MD;  Location: [facility];  Service: Orthopedics;  Laterality: Right;   Surgery for imperforate anus  1958   TONSILLECTOMY     UPPER GASTROINTESTINAL ENDOSCOPY  05/15/03    Family History  Problem Relation Age of Onset   Alzheimer's disease Father    Diabetes Mother    Cancer Mother    Colon cancer Neg Hx    Prostate cancer Neg Hx     No Known Allergies  Current Outpatient Medications on File Prior to Visit  Medication  Sig Dispense Refill   acetaminophen (TYLENOL 8 HOUR) 650 MG CR tablet Take 1 tablet (650 mg total) by mouth every 8 (eight) hours as needed for pain. Take for 10 days. 100 tablet 0   diclofenac Sodium (VOLTAREN) 1 % GEL Apply 2 grams topically 4 (four) times daily to affected joint. 100 g 11   pregabalin (LYRICA) 75 MG capsule Take 1 capsule (75 mg total) by mouth daily. 30 capsule 1   traMADol (ULTRAM) 50 MG tablet TAKE 1 TO 2 TABLETS BY MOUTH EVERY 6 HOURS AS NEEDED FOR PAIN. DO NOT  EXCEED 4.5 TABLETS PER DAY 135 tablet 0   No current facility-administered medications on file prior to visit.    BP 136/76   Pulse 78   Resp 18   Ht 5\' 7"  (1.702 m)   Wt 120 lb (54.4 kg)   SpO2 100%   BMI 18.79 kg/m        Objective:   Physical Exam  General- No acute distress. Pleasant patient. Neck- Full range of motion, no jvd Lungs- Clear, even and unlabored. Heart- regular rate and rhythm. Neurologic- CNII- XII grossly intact.  Rt hand- normal. Rt thumb pain tender to palpation at base and at dip joint. Limited flexion      Assessment & Plan:   Patient Instructions  Neck pain chronic with known severe degenerative changes. You will update contract and give uds. Will refill your tramadol when you are due.  For cough difficulty to determine cause presently on exam mild pnd. Will start with xyzal antihistamine as allergies can be cause of cough. Will see if this helps. Will also make benzonatate available to use if needed.  For rt thumb pain will get xray of your thumb. After review of xray might refer to specialist. If xray normal and unable to flex you may have injured flexor tendon.  Follow up 2-3 weeks or sooner if needed.    Mackie Pai, PA-C

## 2022-06-22 NOTE — Patient Instructions (Addendum)
Neck pain chronic with known severe degenerative changes. You will update contract and give uds. Will refill your tramadol when you are due.  For cough difficulty to determine cause presently on exam mild pnd. Will start with xyzal antihistamine as allergies can be cause of cough. Will see if this helps. Will also make benzonatate available to use if needed.  For rt thumb pain will get xray of your thumb. After review of xray might refer to specialist. If xray normal and unable to flex you may have injured flexor tendon.  Follow up 2-3 weeks or sooner if needed.

## 2022-06-22 NOTE — Addendum Note (Signed)
Addended by: Anabel Halon on: 06/22/2022 05:28 PM   Modules accepted: Orders

## 2022-06-24 ENCOUNTER — Telehealth: Payer: Self-pay | Admitting: *Deleted

## 2022-06-24 LAB — DRUG MONITORING PANEL 376104, URINE
Amphetamines: NEGATIVE ng/mL (ref <500)
Barbiturates: NEGATIVE ng/mL (ref <300)
Benzodiazepines: NEGATIVE ng/mL (ref <100)
Cocaine Metabolite: NEGATIVE ng/mL (ref <150)
Desmethyltramadol: 3815 ng/mL — ABNORMAL HIGH (ref <100)
Opiates: NEGATIVE ng/mL (ref <100)
Oxycodone: NEGATIVE ng/mL (ref <100)
Tramadol: 9486 ng/mL — ABNORMAL HIGH (ref <100)

## 2022-06-24 LAB — DM TEMPLATE

## 2022-06-24 NOTE — Telephone Encounter (Signed)
Pt has an appt at [facility]

## 2022-06-24 NOTE — Telephone Encounter (Signed)
Pt would like to know who we referred him to.  He would like to give them a call to scheduled.  Looks like from referral he was either suppose to be sent to Dr. Erlinda Hong or Dr. Robina Ade.

## 2022-06-29 ENCOUNTER — Other Ambulatory Visit: Payer: Self-pay | Admitting: Medical

## 2022-06-29 DIAGNOSIS — M542 Cervicalgia: Secondary | ICD-10-CM

## 2022-06-29 NOTE — Telephone Encounter (Signed)
/  Requesting: tramadol Contract:06/22/22 UDS:06/22/22 Last Visit:06/22/22 Last Refill:06/02/22  Please Advise

## 2022-06-30 NOTE — Telephone Encounter (Signed)
Rx sent to pharmacy   

## 2022-07-06 DIAGNOSIS — M65311 Trigger thumb, right thumb: Secondary | ICD-10-CM | POA: Diagnosis not present

## 2022-07-20 DIAGNOSIS — M65311 Trigger thumb, right thumb: Secondary | ICD-10-CM | POA: Diagnosis not present

## 2022-07-27 ENCOUNTER — Encounter: Payer: Self-pay | Admitting: Medical

## 2022-07-27 ENCOUNTER — Ambulatory Visit (HOSPITAL_BASED_OUTPATIENT_CLINIC_OR_DEPARTMENT_OTHER)
Admission: RE | Admit: 2022-07-27 | Discharge: 2022-07-27 | Disposition: A | Discharge status: Home / Self Care | Payer: Medicare HMO | Source: Ambulatory Visit | Attending: Internal Medicine | Admitting: Internal Medicine

## 2022-07-27 ENCOUNTER — Encounter: Payer: Self-pay | Admitting: Internal Medicine

## 2022-07-27 ENCOUNTER — Ambulatory Visit (INDEPENDENT_AMBULATORY_CARE_PROVIDER_SITE_OTHER): Payer: Medicare HMO | Admitting: Internal Medicine

## 2022-07-27 VITALS — BP 122/84 | HR 55 | Temp 98.0°F | Resp 16 | Ht 67.0 in | Wt 119.2 lb

## 2022-07-27 DIAGNOSIS — R059 Cough, unspecified: Secondary | ICD-10-CM | POA: Diagnosis not present

## 2022-07-27 NOTE — Patient Instructions (Signed)
Please go to the first floor and get a x-ray  For cough: Get Robitussin DM and take it as needed. You can continue using the [employer]  Drink plenty of fluids  Call if not gradually better. Call anytime if you have severe symptoms, fever or chills or you are feeling worse.

## 2022-07-27 NOTE — Progress Notes (Signed)
Subjective:    Patient ID: [name], male    DOB: 04-12-1956, 67 y.o.   MRN: [id number]  DOS:  07/27/2022 Type of visit - description: Acute Symptoms started 4 weeks ago: Persistent on and off cough with clear sputum.  Denies hemoptysis.  No fever or chills.  No nausea vomiting. No chest pain or difficulty breathing. He is underweight but no recent weight loss. No GERD symptoms, no postnasal dripping.  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   Pneumonia    childhood     Past Surgical History:  Procedure Laterality Date   COLONOSCOPY  11/08/2006   HARDWARE REMOVAL Right 10/23/2015   Procedure: RIGHT KNEE HARDWARE REMOVAL;  Surgeon: Gaynelle Arabian, MD;  Location: [facility];  Service: Orthopedics;  Laterality: Right;  LMA   HERNIA REPAIR  1980   left inguinal hernia   LAPAROTOMY  07/18/2011   Procedure: EXPLORATORY LAPAROTOMY;  Surgeon: Edward Jolly, MD;  Location: [facility];  Service: General;  Laterality: N/A;  lysis of adhesions entero enterostomy   LAPAROTOMY  07/28/2011   Procedure: EXPLORATORY LAPAROTOMY;  Surgeon: Edward Jolly, MD;  [facility];  Service: General;  Laterality: N/A;   Left Knee surgery  1994, 1992   ORIF PATELLA Right 12/14/2014   Procedure: OPEN REDUCTION INTERNAL (ORIF) FIXATION PATELLA;  Surgeon: Gaynelle Arabian, MD;  Location[facility];  Service: Orthopedics;  Laterality: Right;   Surgery for imperforate anus  1958   TONSILLECTOMY     UPPER  GASTROINTESTINAL ENDOSCOPY  05/15/03    Current Outpatient Medications  Medication Instructions   acetaminophen (TYLENOL 8 HOUR) 650 MG CR tablet Take 1 tablet (650 mg total) by mouth every 8 (eight) hours as needed for pain. Take for 10 days.   benzonatate (TESSALON) 100 mg, Oral, 3 times daily PRN   diclofenac Sodium (VOLTAREN) 1 % GEL Apply 2 grams topically 4 (four) times daily to affected joint.   levocetirizine (XYZAL) 5 mg, Oral, Every evening   pregabalin (LYRICA) 75 mg, Oral, Daily   traMADol (ULTRAM) 50 MG tablet TAKE 1 TO 2 TABLETS BY MOUTH EVERY 6 HOURS AS NEEDED FOR PAIN. DO NOT EXCEED 4 AND 1/2 TABLETS PER DAY       Objective:   Physical Exam BP 122/84   Pulse (!) 55   Temp 98 F (36.7 C) (Oral)   Resp 16   Ht '5\' 7"'$  (1.702 m)   Wt 119 lb 4 oz (54.1 kg)   SpO2 97%   BMI 18.68 kg/m  General:   Underweight appearing.  Nontoxic. HEENT:  Normocephalic . Face symmetric, atraumatic.  TMs normal, throat not red, no white patches Lungs:  Few rhonchi with cough Normal respiratory effort, no intercostal retractions, no accessory muscle use. Heart: RRR,  no murmur.  Lower extremities: no pretibial edema bilaterally  Skin: Not  pale. Not jaundice Neurologic:  alert & oriented X3.  Speech normal, gait appropriate for age and unassisted Psych--  Cognition and judgment appear intact.  Cooperative with normal attention span and concentration.  Behavior appropriate. No anxious or depressed appearing.      Assessment     67 year old male, history of anxiety, depression, fecal incontinence, presents with: Cough: Persistent cough for a month, possibly atypical bronchitis, he is underweight and looks chronically ill, to be sure we will get a chest x-ray, most likely will send an antibiotic for results. Currently on benzonatate for cough, recommend to add Robitussin DM.  See AVS.

## 2022-08-01 ENCOUNTER — Other Ambulatory Visit: Payer: Self-pay | Admitting: Medical

## 2022-08-01 DIAGNOSIS — M542 Cervicalgia: Secondary | ICD-10-CM

## 2022-08-03 NOTE — Telephone Encounter (Signed)
Requesting: tramadol '50mg'$   Contract: 06/22/22 UDS: 06/22/22 Last Visit:  06/22/22 Next Visit: None Last Refill: 06/30/22 #135 and 0RF  Please Advise

## 2022-08-04 NOTE — Telephone Encounter (Signed)
Rx refill sent to pharmacy. 

## 2022-08-04 NOTE — Telephone Encounter (Signed)
Pt stated no GI issues or constipation

## 2022-09-03 ENCOUNTER — Other Ambulatory Visit: Payer: Self-pay | Admitting: Medical

## 2022-09-03 DIAGNOSIS — M542 Cervicalgia: Secondary | ICD-10-CM

## 2022-09-03 NOTE — Telephone Encounter (Signed)
Requesting: tramadol 50mg   Contract: 06/22/22 UDS: 06/22/22 Last Visit: 06/22/22 Next Visit: None Last Refill: 08/04/22 #135 and 0RF   Please Advise

## 2022-09-07 NOTE — Telephone Encounter (Signed)
Patient called back to get an update on his medication, he states he has been waiting for a refill since Thursday of last week. Please advice.

## 2022-09-07 NOTE — Telephone Encounter (Signed)
Rx refill sent to pharmacy. 

## 2022-09-08 ENCOUNTER — Encounter: Payer: Self-pay | Admitting: *Deleted

## 2022-09-16 ENCOUNTER — Telehealth: Payer: Self-pay | Admitting: Medical

## 2022-09-16 NOTE — Telephone Encounter (Signed)
Copied from CRM [id number]. Topic: Medicare AWV >> Sep 16, 2022 11:29 AM Payton Doughty wrote: Reason for CRM: Called patient to schedule Medicare Annual Wellness Visit (AWV). Left message for patient to call back and schedule Medicare Annual Wellness Visit (AWV).  Last date of AWV: 10/28/2017  Please schedule an appointment at any time with Donne Anon, CMA  .  If any questions, please contact me.  Thank you ,  Verlee Rossetti; Care Guide Ambulatory Clinical Support [place] [facility] Direct Dial: [phone]

## 2022-10-06 ENCOUNTER — Other Ambulatory Visit: Payer: Self-pay | Admitting: Medical

## 2022-10-06 DIAGNOSIS — M542 Cervicalgia: Secondary | ICD-10-CM

## 2022-10-06 NOTE — Telephone Encounter (Signed)
Requesting: tramadol 50mg   Contract: 06/22/22 UDS: 06/22/22 Last Visit: 06/22/22 Next Visit: None Last Refill: 09/07/22 #135 and 0RF    Please Advise

## 2022-10-06 NOTE — Telephone Encounter (Signed)
Rx refilled. Please get pt scheduled for follow up within next month.

## 2022-11-11 ENCOUNTER — Other Ambulatory Visit: Payer: Self-pay | Admitting: Medical

## 2022-11-11 DIAGNOSIS — M542 Cervicalgia: Secondary | ICD-10-CM

## 2022-11-11 NOTE — Telephone Encounter (Signed)
Requesting: tramadol Contract:06/22/22 UDS:06/22/22 Last Visit:06/22/22 Next Visit:n/a Last Refill:10/06/22  Please Advise

## 2022-11-12 NOTE — Telephone Encounter (Signed)
Pt needs office visit before August before any further controlled med can be rx'd.

## 2022-12-16 ENCOUNTER — Other Ambulatory Visit: Payer: Self-pay | Admitting: Medical

## 2022-12-16 DIAGNOSIS — M542 Cervicalgia: Secondary | ICD-10-CM

## 2022-12-16 NOTE — Telephone Encounter (Signed)
Requesting: tramadol 50mg   Contract: 06/22/22 UDS: 06/22/22 Last Visit: 06/22/22 Next Visit: None Last Refill: 11/12/22 #135 and 0RF  Please Advise

## 2022-12-17 NOTE — Telephone Encounter (Signed)
Pt needs controlled med visit next month. Please go ahead and get him scheudled for in office controlled med visit. Rx refill sent.

## 2022-12-21 NOTE — Telephone Encounter (Signed)
Pt called and lvm to return call 

## 2023-01-11 ENCOUNTER — Telehealth: Payer: Self-pay | Admitting: Podiatry

## 2023-01-11 NOTE — Telephone Encounter (Signed)
Pt left message today at 1233pm just leaving his name and number.  I return call and it would not go thru.

## 2023-01-20 ENCOUNTER — Ambulatory Visit: Payer: Medicare HMO | Admitting: Podiatry

## 2023-01-24 ENCOUNTER — Other Ambulatory Visit: Payer: Self-pay | Admitting: Medical

## 2023-01-24 DIAGNOSIS — M542 Cervicalgia: Secondary | ICD-10-CM

## 2023-01-26 NOTE — Telephone Encounter (Signed)
Requesting: tramadol 50mg   Contract: 07/08/22 UDS: 06/22/22 Last Visit: 06/22/22 Next Visit None Last Refill: 12/17/22 #135 and 0RF   Please Advise

## 2023-01-28 ENCOUNTER — Ambulatory Visit: Payer: Medicare HMO | Admitting: Podiatry

## 2023-01-29 ENCOUNTER — Telehealth: Payer: Self-pay | Admitting: Medical

## 2023-01-29 NOTE — Telephone Encounter (Signed)
Pt requesting refill on Tramadol. Please send to [facility].

## 2023-01-29 NOTE — Telephone Encounter (Signed)
Pt notified via rx pool

## 2023-01-29 NOTE — Telephone Encounter (Signed)
Pt called to follow up on tramadol refill. Advised pt that he was overdue for his 6 month follow up and was able to get him scheduled for 9.9.24. Pt would like to see if Ramon Dredge can go ahead and send in the medication. Please Advise.

## 2023-01-30 MED ORDER — TRAMADOL HCL 50 MG PO TABS: 50.0000 mg | ORAL_TABLET | Freq: Four times a day (QID) | ORAL | 0 refills | Status: DC | PRN

## 2023-01-30 NOTE — Addendum Note (Signed)
Addended by: Gwenevere Abbot on: 01/30/2023 06:14 PM   Modules accepted: Orders

## 2023-02-01 ENCOUNTER — Ambulatory Visit (INDEPENDENT_AMBULATORY_CARE_PROVIDER_SITE_OTHER): Payer: Medicare HMO | Admitting: Medical

## 2023-02-01 VITALS — BP 114/72 | HR 74 | Resp 18 | Ht 67.0 in | Wt 118.6 lb

## 2023-02-01 DIAGNOSIS — G8929 Other chronic pain: Secondary | ICD-10-CM | POA: Diagnosis not present

## 2023-02-01 DIAGNOSIS — M25512 Pain in left shoulder: Secondary | ICD-10-CM | POA: Diagnosis not present

## 2023-02-01 DIAGNOSIS — M542 Cervicalgia: Secondary | ICD-10-CM | POA: Diagnosis not present

## 2023-02-01 DIAGNOSIS — R739 Hyperglycemia, unspecified: Secondary | ICD-10-CM | POA: Diagnosis not present

## 2023-02-01 DIAGNOSIS — M25511 Pain in right shoulder: Secondary | ICD-10-CM | POA: Diagnosis not present

## 2023-02-01 DIAGNOSIS — M5412 Radiculopathy, cervical region: Secondary | ICD-10-CM

## 2023-02-01 DIAGNOSIS — Z79899 Other long term (current) drug therapy: Secondary | ICD-10-CM

## 2023-02-01 DIAGNOSIS — K5900 Constipation, unspecified: Secondary | ICD-10-CM

## 2023-02-01 DIAGNOSIS — L989 Disorder of the skin and subcutaneous tissue, unspecified: Secondary | ICD-10-CM

## 2023-02-01 MED ORDER — TRAMADOL HCL 50 MG PO TABS: ORAL_TABLET | ORAL | 2 refills | Status: DC

## 2023-02-01 NOTE — Patient Instructions (Signed)
Facial Lesion Rapidly growing mole vs seborrheic keratosis on the cheek but requires dermatological evaluation due to significant change in size over the past year. -Refer to dermatology for evaluation and possible removal. -If no appointment within 2-3 weeks, patient to notify office.  Chronic Neck and Shoulder Pain Moderate to severe degenerative changes on the cervical spine with spinal stenosis and minimal disc bulging. Patient reports dependence on Tramadol for pain management. -Continue Tramadol for pain management, mindful of potential GI side effects given patient's history of slow transit constipation and rectal prolapse. -Update controlled medication contract and perform urine drug screen. -Refill Tramadol prescription, patient to fill on or after Thursday per pharmacy controlled medication rules.  Constipation History of slow transit constipation, currently well-managed with high fiber diet and regular bowel movements every other day. -Continue high fiber diet and encourage regular exercise.  General Health Maintenance -Check three-month sugar average and metabolic panel today due to past intermittent elevated sugar levels. -Continue Xyzal as needed for seasonal allergies, patient to notify office if refill needed.   Follow up in 6 months or sooner if needed

## 2023-02-01 NOTE — Progress Notes (Signed)
   Subjective:    Patient ID: [name], male    DOB: [birth year], 67 y.o.   MRN: [id number]  HPI Discussed the use of AI scribe software for clinical note transcription with the patient, who gave verbal consent to proceed.  History of Present Illness   The patient, with a history of chronic neck and bilateral shoulder pain, presented with a skin lesion on his left cheek that has been present for a long time but has grown significantly over the past year. The skin lesion became noticeably larger. The patient also reported chronic neck pain, which has been managed with tramadol. He admitted to feeling jittery and nervous when not on the medication, suggesting a degree of dependence.(no major symptoms described on discussion)  In addition to the neck pain, the patient also suffers from bilateral chronic shoulder pain. His pain management is complicated by a history of gastrointestinal issues, including slow transit constipation, previous rectal prolapse, and imperforate anus surgery. Despite these challenges, the patient reported regular bowel movements every other day, achieved through a high fiber diet and without the use of over-the-counter or prescription medications.  The patient also has a history of allergies, with spring being the worst season. Pt states does not need xyzal.   Past lab results indicated intermittent elevated sugar levels, prompting the decision to conduct a three-month sugar average and metabolic panel.       Pt declines shingrix, flu and pneumonia vaccine. He decline those today.   Review of Systems See hpi.    Objective:   Physical Exam  General Mental Status- Alert. General Appearance- Not in acute distress.   Skin General: Color- Normal Color. Moisture- Normal Moisture.  Neck Carotid Arteries- Normal color. Moisture- Normal Moisture. No carotid bruits. No JVD.  Chest and Lung Exam Auscultation: Breath  Sounds:-Normal.  Cardiovascular Auscultation:Rythm- Regular. Murmurs & Other Heart Sounds:Auscultation of the heart reveals- No Murmurs.  Abdomen Inspection:-Inspeection Normal. Palpation/Percussion:Note:No mass. Palpation and Percussion of the abdomen reveal- Non Tender, Non Distended + BS, no rebound or guarding.  Neurologic Cranial Nerve exam:- CN III-XII intact(No nystagmus), symmetric smile. Strength:- 5/5 equal and symmetric strength both upper and lower extremities.       Assessment & Plan:   Assessment and Plan    Facial Lesion Rapidly growing mole vs seborrheic keratosis on the cheek but requires dermatological evaluation due to significant change in size over the past year. -Refer to dermatology for evaluation and possible removal. -If no appointment within 2-3 weeks, patient to notify office.  Chronic Neck and Shoulder Pain Moderate to severe degenerative changes on the cervical spine with spinal stenosis and minimal disc bulging. Patient reports dependence on Tramadol for pain management. -Continue Tramadol for pain management, mindful of potential GI side effects given patient's history of slow transit constipation and rectal prolapse. -Update controlled medication contract and perform urine drug screen. -Refill Tramadol prescription, patient to fill on or after Thursday per pharmacy controlled medication rules.  Constipation History of slow transit constipation, currently well-managed with high fiber diet and regular bowel movements every other day. -Continue high fiber diet and encourage regular exercise.  General Health Maintenance -Check three-month sugar average and metabolic panel today due to past intermittent elevated sugar levels. -Continue Xyzal as needed for seasonal allergies, patient to notify office if refill needed.   Follow up in 6 months or sooner if needed

## 2023-02-01 NOTE — Addendum Note (Signed)
Addended by: Maximino Sarin on: 02/01/2023 02:21 PM   Modules accepted: Orders

## 2023-02-02 ENCOUNTER — Other Ambulatory Visit: Payer: Self-pay | Admitting: Medical

## 2023-02-02 LAB — COMPREHENSIVE METABOLIC PANEL
ALT: 12 U/L (ref 0–53)
AST: 19 U/L (ref 0–37)
Albumin: 4.2 g/dL (ref 3.5–5.2)
Alkaline Phosphatase: 70 U/L (ref 39–117)
BUN: 11 mg/dL (ref 6–23)
CO2: 31 meq/L (ref 19–32)
Calcium: 9.4 mg/dL (ref 8.4–10.5)
Chloride: 102 meq/L (ref 96–112)
Creatinine, Ser: 0.86 mg/dL (ref 0.40–1.50)
GFR: 90.49 mL/min (ref >60.00)
Glucose, Bld: 135 mg/dL — ABNORMAL HIGH (ref 70–99)
Potassium: 3.8 meq/L (ref 3.5–5.1)
Sodium: 140 meq/L (ref 135–145)
Total Bilirubin: 0.4 mg/dL (ref 0.2–1.2)
Total Protein: 6.4 g/dL (ref 6.0–8.3)

## 2023-02-02 LAB — HEMOGLOBIN A1C: Hgb A1c MFr Bld: 5.8 % (ref 4.6–6.5)

## 2023-02-03 ENCOUNTER — Ambulatory Visit (INDEPENDENT_AMBULATORY_CARE_PROVIDER_SITE_OTHER): Payer: Medicare HMO | Admitting: Podiatry

## 2023-02-03 ENCOUNTER — Encounter: Payer: Self-pay | Admitting: Podiatry

## 2023-02-03 DIAGNOSIS — B351 Tinea unguium: Secondary | ICD-10-CM | POA: Diagnosis not present

## 2023-02-03 DIAGNOSIS — L84 Corns and callosities: Secondary | ICD-10-CM

## 2023-02-03 DIAGNOSIS — M79674 Pain in right toe(s): Secondary | ICD-10-CM

## 2023-02-03 DIAGNOSIS — M79675 Pain in left toe(s): Secondary | ICD-10-CM | POA: Diagnosis not present

## 2023-02-03 LAB — DRUG MONITORING PANEL 376104, URINE
Amphetamines: NEGATIVE ng/mL (ref <500)
Barbiturates: NEGATIVE ng/mL (ref <300)
Benzodiazepines: NEGATIVE ng/mL (ref <100)
Cocaine Metabolite: NEGATIVE ng/mL (ref <150)
Desmethyltramadol: 5844 ng/mL — ABNORMAL HIGH (ref <100)
Opiates: NEGATIVE ng/mL (ref <100)
Oxycodone: NEGATIVE ng/mL (ref <100)
Tramadol: >10000 ng/mL — ABNORMAL HIGH (ref <100)

## 2023-02-03 LAB — DM TEMPLATE

## 2023-02-03 NOTE — Progress Notes (Signed)
Subjective:   Patient ID: [name], male   DOB: 67 y.o.   MRN: [id number]   HPI Patient presents with painful callus plantar left and severely thickened nailbeds 1-5 both feet   ROS      Objective:  Physical Exam  Neurovascular status intact with thick yellow brittle nailbeds 1-5 both feet painful and lesion plantar left painful when pressed     Assessment:  Chronic lesion formation x 3 and mycotic nail infection with pain 1-5 both feet     Plan:  Debridement of lesions slight bleeding left apply Band-Aid Neosporin instructed on soaks and debrided nailbeds 1-5 both feet Neutra genic bleeding

## 2023-03-22 ENCOUNTER — Telehealth: Payer: Self-pay | Admitting: Medical

## 2023-03-22 NOTE — Telephone Encounter (Signed)
Called patient to get appt set up, no answer & no VM set up

## 2023-03-22 NOTE — Telephone Encounter (Signed)
Patient scheduled for tomorrow morning  

## 2023-03-22 NOTE — Telephone Encounter (Signed)
Pt called and requested for pcp to give him call to discuss changing his arthritis medication. Please calla and advise pt.

## 2023-03-23 ENCOUNTER — Ambulatory Visit (INDEPENDENT_AMBULATORY_CARE_PROVIDER_SITE_OTHER): Payer: Medicare HMO | Admitting: Medical

## 2023-03-23 VITALS — BP 108/80 | HR 68 | Resp 18 | Ht 67.0 in | Wt 117.6 lb

## 2023-03-23 DIAGNOSIS — Z8719 Personal history of other diseases of the digestive system: Secondary | ICD-10-CM

## 2023-03-23 DIAGNOSIS — G8929 Other chronic pain: Secondary | ICD-10-CM

## 2023-03-23 DIAGNOSIS — M25512 Pain in left shoulder: Secondary | ICD-10-CM

## 2023-03-23 DIAGNOSIS — M542 Cervicalgia: Secondary | ICD-10-CM | POA: Diagnosis not present

## 2023-03-23 DIAGNOSIS — Z79899 Other long term (current) drug therapy: Secondary | ICD-10-CM | POA: Diagnosis not present

## 2023-03-23 DIAGNOSIS — M25511 Pain in right shoulder: Secondary | ICD-10-CM | POA: Diagnosis not present

## 2023-03-23 MED ORDER — METHYLPREDNISOLONE 4 MG PO TABS: ORAL_TABLET | ORAL | 0 refills | Status: DC

## 2023-03-23 NOTE — Patient Instructions (Addendum)
Chronic neck and shoulder pain.  Severe  cervical degerative disc disease, spinal stenosis and foraminal stenosis. With hx of small bowel obstruction years ago requiring surgery.  -Hesitant to prescribe high-dose narcotics such as oxycodone and hydrocodone due to GI history.  Tramadol has helped in the past but recently not adequate.  On review NSAIDs have not been adequate for pain control either. -Discussed benefits versus risk of low-dose Medrol.  Will start with Medrol taper dose over 6 days and hopefully pain will subside.  You can still use tramadol as a backup for severe pain. -Recent A1c 5.8. Recommend low sugar diet.  Follow-up in 10 to 14 days or sooner if needed

## 2023-03-23 NOTE — Progress Notes (Signed)
Subjective:    Patient ID: [name], male    DOB: Jul 26, 1955, 67 y.o.   MRN: [id number]  HPI  Still has chronic pain but shoulder are more painful recently.   Pt in for follow up. He has hx of neck pain and shoulder pain.  this pain is despite using tramadol 50 mg every 6 hours.   IMPRESSION: 1. 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 C6 is virtually the only normally formed cervical vertebrae (although demonstrates spina bifida occulta). No acute osseous abnormality in the spine.   2. 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 Mild spinal stenosis at both levels with up to mild spinal cord mass effect. Moderate to severe C6 neural foraminal stenosis suspected although better depicted on the January MRI.   3. C1 level spinal stenosis with up to mild cord mass effect, and other mild bilateral cervical foraminal stenosis, which are unrelated to disc disease.   4. No significant thoracic spinal stenosis. There is moderate to severe osseous neural foraminal stenosis on the left at the T2 and T3 nerve levels.   5. 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 There is only mild multifactorial bilateral L4 foraminal stenosis, with no convincing lumbar spinal or lateral recess stenosis.     Shoulder xray below.   IMPRESSION: There is no acute bony abnormality of the left shoulder. Mild narrowing of the subacromial subdeltoid space may be impacting the rotator cuff.     IMPRESSION: There is no acute or significant chronic bony abnormality of the right shoulder   Pt has chronic pain and is on tramadol.  He is interested in steroid injection.   Pt has seen Dr. Jordan Likes as well.    Tendinitis of both rotator cuffs Acute on chronic in nature.  No injuries or inciting event. -Counseled on home  exercise therapy and supportive care. -Voltaren. -Can do injections in the future.   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 He was last seen in clinic in July. Colonoscopy was completed 12/29/2018 with poor prep, but no obvious lesions.   Pt has hx of bowel obstruction and had surgery for this in 2013.   So in past had been hesitant to prescribe other med stronger that tramadol 50 mg. On some days now he use up to 4.5 tab over 24 hour period. Pt tells me know his shoulders hurt more than his neck. Pain in shoulders varies from 2-8 level pain. Worse in morning but then subsides to lower level after active during the day.   On review pt is not diabetic.  On review pt pain did not improve with nsaids ibuprofen or diclofenac.      Review of Systems  Constitutional:  Negative for chills.  HENT:  Negative for congestion and ear discharge.   Respiratory:  Negative for cough, chest tightness and wheezing.   Cardiovascular:  Negative for chest pain and palpitations.  Gastrointestinal:  Negative for abdominal pain, constipation, diarrhea and vomiting.       Recently normal bm once daily. No constipation.    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   Pneumonia    childhood      Social History   Socioeconomic History   Marital status: Single    Spouse name: Not on file   Number of children: 0   Years of education: Not on file   Highest education level: Not on file  Occupational History   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  Tobacco Use   Smoking status: Never   Smokeless  tobacco: Never  Vaping Use   Vaping status: Never Used  Substance and Sexual Activity   Alcohol use: No    Alcohol/week: 0.0 standard drinks of alcohol   Drug use: No   Sexual activity: Yes  Other Topics Concern   Not on file  Social History Narrative   Lives w/ mother , has a younger brother and sister    Social Determinants of Health   Financial Resource Strain: Low Risk  (09/30/2021)   Overall Financial Resource Strain (CARDIA)    Difficulty of Paying Living Expenses: Not hard at all  Food Insecurity: No Food Insecurity (09/30/2021)   Hunger Vital Sign    Worried About Running Out of Food in the Last Year: Never true    Ran Out of Food in the Last Year: Never true  Transportation Needs: No Transportation Needs (09/30/2021)   PRAPARE - Administrator, Civil Service (Medical): No    Lack of Transportation (Non-Medical): No  Physical Activity: Sufficiently Active (09/30/2021)   Exercise Vital Sign    Days of Exercise per Week: 7 days    Minutes of Exercise per Session: 60 min  Stress: No Stress Concern Present (09/30/2021)   [employer] of Occupational Health - Occupational Stress Questionnaire    Feeling of Stress : Not at all  Social Connections: Socially Integrated (09/30/2021)   Social Connection and Isolation Panel [NHANES]    Frequency of Communication with Friends and Family: More than three times a week    Frequency of Social Gatherings with Friends and Family: More than three times a week    Attends Religious Services: More than 4 times per year    Active Member of Clubs or Organizations: Yes    Attends Banker Meetings: More than 4 times per year    Marital Status: Married  Catering manager Violence: Not At Risk (09/30/2021)   Humiliation, Afraid, Rape, and Kick questionnaire    Fear of Current or Ex-Partner: No    Emotionally Abused: No    Physically Abused: No    Sexually Abused: No    Past Surgical History:  Procedure Laterality Date    COLONOSCOPY  11/08/2006   HARDWARE REMOVAL Right 10/23/2015   Procedure: RIGHT KNEE HARDWARE REMOVAL;  Surgeon: Ollen Gross, MD;  Location: [facility];  Service: Orthopedics;  Laterality: Right;  LMA   HERNIA REPAIR  1980   left inguinal hernia   LAPAROTOMY  07/18/2011   Procedure: EXPLORATORY LAPAROTOMY;  Surgeon: Mariella Saa, MD;  Location: [facility];  Service: General;  Laterality: N/A;  lysis of adhesions entero enterostomy   LAPAROTOMY  07/28/2011   Procedure: EXPLORATORY LAPAROTOMY;  Surgeon: Mariella Saa, MD;  Location: [facility];  Service: General;  Laterality: N/A;   Left Knee surgery  1994, 1992   ORIF PATELLA Right 12/14/2014  Procedure: OPEN REDUCTION INTERNAL (ORIF) FIXATION PATELLA;  Surgeon: Ollen Gross, MD;  Location: [facility];  Service: Orthopedics;  Laterality: Right;   Surgery for imperforate anus  1958   TONSILLECTOMY     UPPER GASTROINTESTINAL ENDOSCOPY  05/15/03    Family History  Problem Relation Age of Onset   Alzheimer's disease Father    Diabetes Mother    Cancer Mother    Colon cancer Neg Hx    Prostate cancer Neg Hx     No Known Allergies  Current Outpatient Medications on File Prior to Visit  Medication Sig Dispense Refill   acetaminophen (TYLENOL 8 HOUR) 650 MG CR tablet Take 1 tablet (650 mg total) by mouth every 8 (eight) hours as needed for pain. Take for 10 days. 100 tablet 0   benzonatate (TESSALON) 100 MG capsule Take 1 capsule (100 mg total) by mouth 3 (three) times daily as needed for cough. 30 capsule 0   diclofenac Sodium (VOLTAREN) 1 % GEL Apply 2 grams topically 4 (four) times daily to affected joint. 100 g 11   levocetirizine (XYZAL) 5 MG tablet Take 1 tablet (5 mg total) by mouth every evening. 30 tablet 0   pregabalin (LYRICA) 75 MG capsule Take 1 capsule (75 mg total) by mouth daily. 30 capsule 1   traMADol (ULTRAM) 50 MG tablet TAKE ONE TO TWO TABLETS BY MOUTH EVERY 6 HOURS AS NEEDED FOR PAIN **DO NOT TAKE MORE THAN 4 AND 1/2 TABLETS  PER DAY** 135 tablet 0   traMADol (ULTRAM) 50 MG tablet TAKE ONE TO TWO TABLETS BY MOUTH EVERY 6 HOURS AS NEEDED FOR PAIN **DO NOT TAKE MORE THAN 4 AND 1/2 TABLETS PER DAY** 135 tablet 2   No current facility-administered medications on file prior to visit.    BP 108/80   Pulse 68   Resp 18   Ht 5\' 7"  (1.702 m)   Wt 117 lb 9.6 oz (53.3 kg)   SpO2 99%   BMI 18.42 kg/m        Objective:   Physical Exam   General Mental Status- Alert. General Appearance- Not in acute distress.    Skin General: Color- Normal Color. Moisture- Normal Moisture.   Neck Carotid Arteries- Normal color. Moisture- Normal Moisture. No carotid bruits. No JVD.   Chest and Lung Exam Auscultation: Breath Sounds:-Normal.   Cardiovascular Auscultation:Rythm- Regular. Murmurs & Other Heart Sounds:Auscultation of the heart reveals- No Murmurs.   Abdomen Inspection:-Inspeection Normal. Palpation/Percussion:Note:No mass. Palpation and Percussion of the abdomen reveal- Non Tender, Non Distended + BS, no rebound or guarding.   Neurologic Cranial Nerve exam:- CN III-XII intact(No nystagmus), symmetric smile. Strength:- 5/5 equal and symmetric strength both upper and lower extremities.      Assessment & Plan:   Patient Instructions  Chronic neck and shoulder pain.  Severe  cervical degerative disc disease, spinal stenosis and foraminal stenosis. With hx of small bowel obstruction years ago requiring surgery.  -Hesitant to prescribe high-dose narcotics such as oxycodone and hydrocodone due to GI history.  Tramadol has helped in the past but recently not adequate.  On review NSAIDs have not been adequate for pain control either. -Discussed benefits versus risk of low-dose Medrol.  Will start with Medrol taper dose over 6 days and hopefully pain will subside.  You can still use tramadol as a backup for severe pain. -Recent A1c 5.8. Recommend low sugar diet.  Follow-up in 10 to 14 days or sooner if needed    [employer],  PA-C

## 2023-04-21 ENCOUNTER — Telehealth: Payer: Self-pay | Admitting: Medical

## 2023-04-21 NOTE — Telephone Encounter (Signed)
Pt called and stated that he and Ramon Dredge previously had a conversation about potentially being prescribed a stronger pain medication. Pt could not state the name of it but he is now asking for it to be sent in as he stated it's not working. Please call and advise.

## 2023-04-21 NOTE — Telephone Encounter (Signed)
Pt called and lvm to return call 

## 2023-04-29 ENCOUNTER — Telehealth: Payer: Self-pay | Admitting: Medical

## 2023-04-29 NOTE — Telephone Encounter (Signed)
No answer and vm not set up

## 2023-04-29 NOTE — Telephone Encounter (Signed)
Patient called and requested for PCP or nurse to give him a call to discuss changing his Tramadol medication. He stated that PCP would know what he's referring too. Please call and advise with patient.

## 2023-04-30 NOTE — Telephone Encounter (Signed)
No answer and vm not set up

## 2023-05-03 ENCOUNTER — Encounter: Payer: Self-pay | Admitting: Medical

## 2023-05-03 ENCOUNTER — Ambulatory Visit (INDEPENDENT_AMBULATORY_CARE_PROVIDER_SITE_OTHER): Payer: Medicare HMO | Admitting: Medical

## 2023-05-03 VITALS — BP 125/76 | HR 63 | Temp 97.7°F | Ht 67.0 in | Wt 118.0 lb

## 2023-05-03 DIAGNOSIS — M25512 Pain in left shoulder: Secondary | ICD-10-CM | POA: Diagnosis not present

## 2023-05-03 DIAGNOSIS — Z8719 Personal history of other diseases of the digestive system: Secondary | ICD-10-CM | POA: Diagnosis not present

## 2023-05-03 DIAGNOSIS — M25511 Pain in right shoulder: Secondary | ICD-10-CM | POA: Diagnosis not present

## 2023-05-03 DIAGNOSIS — M542 Cervicalgia: Secondary | ICD-10-CM | POA: Diagnosis not present

## 2023-05-03 DIAGNOSIS — G8929 Other chronic pain: Secondary | ICD-10-CM | POA: Diagnosis not present

## 2023-05-03 DIAGNOSIS — Z79899 Other long term (current) drug therapy: Secondary | ICD-10-CM | POA: Diagnosis not present

## 2023-05-03 MED ORDER — HYDROCODONE-ACETAMINOPHEN 5-325 MG PO TABS: ORAL_TABLET | ORAL | 0 refills | Status: DC

## 2023-05-03 NOTE — Progress Notes (Signed)
Subjective:    Patient ID: [name], male    DOB: [birth year], 67 y.o.   MRN: [id number]  HPI Pt in for follow up.   He has history of chronic neck pain and shoulder pain.  Below is last A/P  "Pt in for follow up. He has hx of neck pain and shoulder pain.  this pain is despite using tramadol 50 mg every 6 hours.   IMPRESSION: 1. 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 C6 is virtually the only normally formed cervical vertebrae (although demonstrates spina bifida occulta). No acute osseous abnormality in the spine.   2. 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 Mild spinal stenosis at both levels with up to mild spinal cord mass effect. Moderate to severe C6 neural foraminal stenosis suspected although better depicted on the January MRI.   3. C1 level spinal stenosis with up to mild cord mass effect, and other mild bilateral cervical foraminal stenosis, which are unrelated to disc disease.   4. No significant thoracic spinal stenosis. There is moderate to severe osseous neural foraminal stenosis on the left at the T2 and T3 nerve levels.   5. 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 There is only mild multifactorial bilateral L4 foraminal stenosis, with no convincing lumbar spinal or lateral recess stenosis.     Shoulder xray below.   IMPRESSION: There is no acute bony abnormality of the left shoulder. Mild narrowing of the subacromial subdeltoid space may be impacting the rotator cuff.     IMPRESSION: There is no acute or significant chronic bony abnormality of the right shoulder   Pt has chronic pain and is on tramadol.  He is interested in steroid injection.   Pt has seen Dr. Jordan Likes as well.    Tendinitis of both rotator cuffs Acute on chronic in nature.  No injuries or  inciting event. -Counseled on home exercise therapy and supportive care. -Voltaren. -Can do injections in the future.     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 He was last seen in clinic in July. Colonoscopy was completed 12/29/2018 with poor prep, but no obvious lesions.    Pt has hx of bowel obstruction and had surgery for this in 2013.    So in past had been hesitant to prescribe other med stronger that tramadol 50 mg. On some days now he use up to 4.5 tab over 24 hour period. Pt tells me know his shoulders hurt more than his neck. Pain in shoulders varies from 2-8 level pain. Worse in morning but then subsides to lower level after active during the day."   Discussed the use of AI scribe software for clinical note transcription with the patient, who gave verbal consent to proceed.  History of Present Illness   The patient, with a known history of cervical spine disease, end plate degeneration, spinal stenosis, and C6 neural foraminal stenosis, presents with persistent neck and shoulder pain. The pain is described as significant, with a severity of around seven to eight on a scale of ten. The discomfort is more pronounced in the neck region and is a daily occurrence. Despite the pain, the patient has learned to manage it enough to prevent it from disrupting his sleep.  The patient has been on a regimen of tramadol, taking one to two tablets every  six hours, with a maximum of four and a half tablets per day. Despite this, the patient reports that the pain remains significant. Interestingly, the patient has not experienced constipation, a common side effect of tramadol, and maintains a regular bowel pattern, with bowel movements every other day.  The patient has previously consulted with a neurosurgeon regarding his cervical spine disease and stenosis. Surgery was recommended   . The patient's bowel pattern and potential for constipation with  increased pain medication is a concern due to a history of surgery in childhood and a known small bowel obstruction as an adult.        Review of Systems  Constitutional:  Negative for chills and fatigue.  Respiratory:  Negative for cough, chest tightness, shortness of breath and wheezing.   Cardiovascular:  Negative for chest pain.  Gastrointestinal:  Negative for abdominal pain, constipation, nausea and vomiting.       Last bm last night.  Genitourinary:  Negative for dysuria, frequency and penile swelling.  Musculoskeletal:  Positive for neck pain. Negative for myalgias and neck stiffness.       Shoulder pain.   Skin:  Negative for rash.  Neurological:  Negative for dizziness, seizures and headaches.  Hematological:  Negative for adenopathy. Does not bruise/bleed easily.  Psychiatric/Behavioral:  Negative for behavioral problems and dysphoric mood. The patient is not nervous/anxious.    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   Pneumonia    childhood      Social History   Socioeconomic History   Marital status: Single    Spouse name: Not on file   Number of children: 0   Years of education: Not on file   Highest education level: Not on file  Occupational History   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  Tobacco Use   Smoking status: Never   Smokeless tobacco: Never  Vaping Use   Vaping status: Never Used  Substance and Sexual Activity   Alcohol use: No    Alcohol/week: 0.0 standard drinks of alcohol   Drug use: No   Sexual activity: Yes  Other Topics Concern   Not on file  Social History Narrative   Lives w/  mother , has a younger brother and sister    Social Determinants of Health   Financial Resource Strain: Low Risk  (09/30/2021)   Overall Financial Resource Strain (CARDIA)    Difficulty of Paying Living Expenses: Not hard at all  Food Insecurity: No Food Insecurity (09/30/2021)   Hunger Vital Sign    Worried About Running Out of Food in the Last Year: Never true    Ran Out of Food in the Last Year: Never true  Transportation Needs: No Transportation Needs (09/30/2021)   PRAPARE - Administrator, Civil Service (Medical): No    Lack of Transportation (Non-Medical): No  Physical Activity: Sufficiently Active (09/30/2021)   Exercise Vital Sign    Days of Exercise per Week: 7 days    Minutes of Exercise per Session: 60 min  Stress: No Stress Concern Present (09/30/2021)  [employer] of Occupational Health - Occupational Stress Questionnaire    Feeling of Stress : Not at all  Social Connections: Socially Integrated (09/30/2021)   Social Connection and Isolation Panel [NHANES]    Frequency of Communication with Friends and Family: More than three times a week    Frequency of Social Gatherings with Friends and Family: More than three times a week    Attends Religious Services: More than 4 times per year    Active Member of Clubs or Organizations: Yes    Attends Banker Meetings: More than 4 times per year    Marital Status: Married  Catering manager Violence: Not At Risk (09/30/2021)   Humiliation, Afraid, Rape, and Kick questionnaire    Fear of Current or Ex-Partner: No    Emotionally Abused: No    Physically Abused: No    Sexually Abused: No    Past Surgical History:  Procedure Laterality Date   COLONOSCOPY  11/08/2006   HARDWARE REMOVAL Right 10/23/2015   Procedure: RIGHT KNEE HARDWARE REMOVAL;  Surgeon: Ollen Gross, MD;  Location: [facility];  Service: Orthopedics;  Laterality: Right;  LMA   HERNIA REPAIR  1980   left inguinal hernia   LAPAROTOMY  07/18/2011    Procedure: EXPLORATORY LAPAROTOMY;  Surgeon: Mariella Saa, MD;  Location: [facility];  Service: General;  Laterality: N/A;  lysis of adhesions entero enterostomy   LAPAROTOMY  07/28/2011   Procedure: EXPLORATORY LAPAROTOMY;  Surgeon: Mariella Saa, MD;  Location: [facility];  Service: General;  Laterality: N/A;   Left Knee surgery  1994, 1992   ORIF PATELLA Right 12/14/2014   Procedure: OPEN REDUCTION INTERNAL (ORIF) FIXATION PATELLA;  Surgeon: Ollen Gross, MD;  Location: [facility];  Service: Orthopedics;  Laterality: Right;   Surgery for imperforate anus  1958   TONSILLECTOMY     UPPER GASTROINTESTINAL ENDOSCOPY  05/15/03    Family History  Problem Relation Age of Onset   Alzheimer's disease Father    Diabetes Mother    Cancer Mother    Colon cancer Neg Hx    Prostate cancer Neg Hx     No Known Allergies  Current Outpatient Medications on File Prior to Visit  Medication Sig Dispense Refill   acetaminophen (TYLENOL 8 HOUR) 650 MG CR tablet Take 1 tablet (650 mg total) by mouth every 8 (eight) hours as needed for pain. Take for 10 days. 100 tablet 0   benzonatate (TESSALON) 100 MG capsule Take 1 capsule (100 mg total) by mouth 3 (three) times daily as needed for cough. 30 capsule 0   diclofenac Sodium (VOLTAREN) 1 % GEL Apply 2 grams topically 4 (four) times daily to affected joint. 100 g 11   levocetirizine (XYZAL) 5 MG tablet Take 1 tablet (5 mg total) by mouth every evening. 30 tablet 0   methylPREDNISolone (MEDROL) 4 MG tablet Standard 6 day taper dose 21 tablet 0   pregabalin (LYRICA) 75 MG capsule Take 1 capsule (75 mg total) by mouth daily. 30 capsule 1   No current facility-administered medications on file prior to visit.    BP 125/76   Pulse 63   Temp 97.7 F (36.5 C) (Oral)   Ht 5\' 7"  (1.702 m)   Wt 118 lb (53.5 kg)   SpO2 100%   BMI 18.48 kg/m        Objective:   Physical Exam  General Mental Status- Alert. General Appearance- Not in acute distress.    Skin General: Color-  Normal Color. Moisture- Normal Moisture.  Neck Carotid Arteries- Normal color. Moisture- Normal Moisture. No carotid bruits. No JVD.  Chest and Lung Exam Auscultation: Breath Sounds:-Normal.  Cardiovascular Auscultation:Rythm- Regular. Murmurs & Other Heart Sounds:Auscultation of the heart reveals- No Murmurs.  Abdomen Inspection:-Inspeection Normal. Palpation/Percussion:Note:No mass. Palpation and Percussion of the abdomen reveal- Non Tender, Non Distended + BS, no rebound or guarding.   Neurologic Cranial Nerve exam:- CN III-XII intact(No nystagmus), symmetric smile. Strength:- 5/5 equal and symmetric strength both upper and lower extremities.       Assessment & Plan:   Assessment and Plan    Cervical Spine Disease Daily neck and shoulder pain, rated 7-8/10. 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 No recent radicular symptoms. Neurosurgical consultation in the past but no surgery performed. -Trial of Norco (hydrocodone) for 5 days, 0.5-1 tablet every 8 hours. -Do not take Tramadol and Norco concurrently. -Consider re-evaluation by neurosurgeon if pain persists.  Bowel Movements Regular bowel movements every other day, despite Tramadol use. History of small bowel obstruction. -Encourage hydration, healthy diet, and activity to promote regular bowel movements. -Monitor bowel movements while on Norco trial. -Consider gastroenterology consultation if constipation develops with Norco use. Get opinion from GI MD potentially regarding meds specifically for opioid induced constipation with history med hx and surgical history.  Follow-up Patient to provide update on pain level and bowel movements by Thursday or Friday of the same week. Based on this update, a follow-up appointment will be scheduled. If abdominal pain, bloating, or cessation of bowel movements occur, patient is  advised to go to the emergency department.        Esperanza Richters, PA-C

## 2023-05-03 NOTE — Patient Instructions (Signed)
Assessment and Plan    Cervical Spine Disease Daily neck and shoulder pain, rated 7-8/10. 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 No recent radicular symptoms. Neurosurgical consultation in the past but no surgery performed. -Trial of Norco (hydrocodone) for 5 days, 0.5-1 tablet every 8 hours. -Do not take Tramadol and Norco concurrently. -Consider re-evaluation by neurosurgeon if pain persists.  Bowel Movements Regular bowel movements every other day, despite Tramadol use. History of small bowel obstruction. -Encourage hydration, healthy diet, and activity to promote regular bowel movements. -Monitor bowel movements while on Norco trial. -Consider gastroenterology consultation if constipation develops with Norco use. Get opinion from GI MD potentially regarding meds specifically for opioid induced constipation with history med hx and surgical history.  Follow-up Patient to provide update on pain level and bowel movements by Thursday or Friday of the same week. Based on this update, a follow-up appointment will be scheduled. If abdominal pain, bloating, or cessation of bowel movements occur, patient is advised to go to the emergency department.

## 2023-05-03 NOTE — Telephone Encounter (Signed)
Pt has an appt today 05/03/23

## 2023-05-07 ENCOUNTER — Telehealth: Payer: Self-pay | Admitting: Medical

## 2023-05-07 NOTE — Telephone Encounter (Signed)
noted 

## 2023-05-07 NOTE — Telephone Encounter (Signed)
Pt called to advise Ramon Dredge that he had not started taking his Hydrocodone due to losing his wallet and not being able to pick it up. Pt is currently making arrangements to address this issue but just wanted to let Ramon Dredge know.

## 2023-05-10 ENCOUNTER — Telehealth: Payer: Self-pay

## 2023-05-10 NOTE — Telephone Encounter (Signed)
Pt called back to ask if Ramon Dredge could change his medication back to Tramadol as his pharmacy has not required his ID in the past with that medication and he is not able to get a temporary ID at the moment. Advised pt that there is the possibility that they may require it still due to Tramadol being a controlled substance as well. Pt asked if the message could still be sent back.

## 2023-05-10 NOTE — Telephone Encounter (Signed)
Opened in error

## 2023-05-10 NOTE — Telephone Encounter (Signed)
Spoke with Belinda Fisher at [facility] , script cancelled and confrimed pt doesn't need an ID to pick up tramadol

## 2023-05-10 NOTE — Telephone Encounter (Signed)
Pharmacist Romeo Apple called to confirm if it was okay for pt to keep [facility] since sister came in with valid ID made him aware pt only wanted script switched due to no ID so it is ok to pick up [facility]

## 2023-05-11 NOTE — Telephone Encounter (Signed)
Pt called back stating that he was having issues with the Hydrocodone and would like to go back to the tramadol. Pt stated that it makes him feel terrible and he does not like the way it makes him feel.

## 2023-05-11 NOTE — Telephone Encounter (Signed)
Pt called and lvm to return call 

## 2023-05-11 NOTE — Telephone Encounter (Signed)
NORCO was filled yesterday at the time of the phone call

## 2023-05-12 ENCOUNTER — Other Ambulatory Visit: Payer: Self-pay | Admitting: Medical

## 2023-05-12 MED ORDER — TRAMADOL HCL 50 MG PO TABS: ORAL_TABLET | ORAL | 0 refills | Status: DC

## 2023-05-12 NOTE — Telephone Encounter (Signed)
Pt called and lvm to return call 

## 2023-05-12 NOTE — Telephone Encounter (Signed)
Requesting: tramadol 50mg   Contract: 02/01/23 UDS: 02/01/23 Last Visit: 05/03/23 Next Visit: None Last Refill: Unknown last refill date  Please Advise

## 2023-05-12 NOTE — Addendum Note (Signed)
Addended by: Gwenevere Abbot on: 05/12/2023 05:23 PM   Modules accepted: Orders

## 2023-05-24 ENCOUNTER — Ambulatory Visit (INDEPENDENT_AMBULATORY_CARE_PROVIDER_SITE_OTHER): Payer: Medicare HMO | Admitting: Medical

## 2023-05-24 ENCOUNTER — Ambulatory Visit (HOSPITAL_BASED_OUTPATIENT_CLINIC_OR_DEPARTMENT_OTHER)
Admission: RE | Admit: 2023-05-24 | Discharge: 2023-05-24 | Disposition: A | Discharge status: Home / Self Care | Payer: Medicare HMO | Source: Ambulatory Visit | Attending: Medical | Admitting: Medical

## 2023-05-24 VITALS — BP 114/78 | HR 70 | Temp 98.2°F | Resp 18 | Ht 67.0 in | Wt 117.0 lb

## 2023-05-24 DIAGNOSIS — R194 Change in bowel habit: Secondary | ICD-10-CM | POA: Diagnosis not present

## 2023-05-24 DIAGNOSIS — R197 Diarrhea, unspecified: Secondary | ICD-10-CM | POA: Diagnosis not present

## 2023-05-24 DIAGNOSIS — Z8719 Personal history of other diseases of the digestive system: Secondary | ICD-10-CM | POA: Diagnosis not present

## 2023-05-24 DIAGNOSIS — K5900 Constipation, unspecified: Secondary | ICD-10-CM | POA: Diagnosis not present

## 2023-05-24 LAB — CBC WITH DIFFERENTIAL/PLATELET
Basophils Absolute: 0.1 10*3/uL (ref 0.0–0.1)
Basophils Relative: 2 % (ref 0.0–3.0)
Eosinophils Absolute: 0.3 10*3/uL (ref 0.0–0.7)
Eosinophils Relative: 6.6 % — ABNORMAL HIGH (ref 0.0–5.0)
HCT: 42.9 % (ref 39.0–52.0)
Hemoglobin: 14.1 g/dL (ref 13.0–17.0)
Lymphocytes Relative: 27.3 % (ref 12.0–46.0)
Lymphs Abs: 1.1 10*3/uL (ref 0.7–4.0)
MCHC: 32.9 g/dL (ref 30.0–36.0)
MCV: 87.2 fL (ref 78.0–100.0)
Monocytes Absolute: 0.3 10*3/uL (ref 0.1–1.0)
Monocytes Relative: 7.3 % (ref 3.0–12.0)
Neutro Abs: 2.4 10*3/uL (ref 1.4–7.7)
Neutrophils Relative %: 56.8 % (ref 43.0–77.0)
Platelets: 252 10*3/uL (ref 150.0–400.0)
RBC: 4.92 Mil/uL (ref 4.22–5.81)
RDW: 14.1 % (ref 11.5–15.5)
WBC: 4.2 10*3/uL (ref 4.0–10.5)

## 2023-05-24 LAB — COMPREHENSIVE METABOLIC PANEL
ALT: 15 U/L (ref 0–53)
AST: 25 U/L (ref 0–37)
Albumin: 4.6 g/dL (ref 3.5–5.2)
Alkaline Phosphatase: 69 U/L (ref 39–117)
BUN: 12 mg/dL (ref 6–23)
CO2: 31 meq/L (ref 19–32)
Calcium: 9.5 mg/dL (ref 8.4–10.5)
Chloride: 100 meq/L (ref 96–112)
Creatinine, Ser: 0.99 mg/dL (ref 0.40–1.50)
GFR: 79.44 mL/min (ref >60.00)
Glucose, Bld: 73 mg/dL (ref 70–99)
Potassium: 4 meq/L (ref 3.5–5.1)
Sodium: 139 meq/L (ref 135–145)
Total Bilirubin: 0.5 mg/dL (ref 0.2–1.2)
Total Protein: 6.7 g/dL (ref 6.0–8.3)

## 2023-05-24 NOTE — Progress Notes (Signed)
Subjective:    Patient ID: [name], male    DOB: 01-Feb-1956, 67 y.o.   MRN: [id number]  HPI   Discussed the use of AI scribe software for clinical note transcription with the patient, who gave verbal consent to proceed.  History of Present Illness   The patient, with a history of imperforate anus, bowel obstruction, and hx of intermittent constipation along with overflow diarrhea, presents with a recent change in bowel habits. Over the past three to four days, he has experienced frequent bowel movements, up to fifteen times a day, with no associated pain. The stools are described as greasy and too soft, but not watery, and the patient does not classify it as diarrhea.  Prior to this, the patient had been having fairly normal stools, with no recent episodes of constipation. He had been managing well for the past three years, with a mixture of constipation and looser stools, after deciding against a recommended surgical intervention in 2022. GI MD recommended ileostomy.  The patient is currently on daily Tramadol for severe neck pain, which has been a concern in the past due to its potential to slow bowel movements and predispose to constipation. However this has not been the case over the past 3 years.     He has previously been prescribed Linzess by a GI specialist for chronic constipation and has expressed a willingness to try this medication again.        Review of Systems  Constitutional:  Negative for chills, fatigue and unexpected weight change.  Respiratory:  Negative for cough, chest tightness and wheezing.   Cardiovascular:  Negative for chest pain and palpitations.  Gastrointestinal:  Negative for abdominal pain, blood in stool, nausea and vomiting.       Looser stools.  Musculoskeletal:  Negative for back pain, myalgias and neck stiffness.  Skin:  Negative for rash.  Neurological:  Negative for dizziness, seizures and weakness.  Hematological:  Negative for  adenopathy. Does not bruise/bleed easily.  Psychiatric/Behavioral:  Negative for behavioral problems and dysphoric mood.     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   Pneumonia    childhood      Social History   Socioeconomic History   Marital status: Single    Spouse name: Not on file   Number of children: 0   Years of education: Not on file   Highest education level: Not on file  Occupational History   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  Tobacco Use   Smoking status: Never   Smokeless tobacco: Never  Vaping Use   Vaping status: Never Used  Substance and Sexual Activity   Alcohol use: No    Alcohol/week: 0.0 standard drinks of alcohol   Drug use: No   Sexual activity: Yes  Other Topics Concern   Not on file  Social History Narrative   Lives w/ mother , has a younger brother and sister    Social Drivers of Corporate investment banker Strain: Low Risk  (09/30/2021)   Overall Financial Resource Strain (CARDIA)  Difficulty of Paying Living Expenses: Not hard at all  Food Insecurity: No Food Insecurity (09/30/2021)   Hunger Vital Sign    Worried About Running Out of Food in the Last Year: Never true    Ran Out of Food in the Last Year: Never true  Transportation Needs: No Transportation Needs (09/30/2021)   PRAPARE - Administrator, Civil Service (Medical): No    Lack of Transportation (Non-Medical): No  Physical Activity: Sufficiently Active (09/30/2021)   Exercise Vital Sign    Days of Exercise per Week: 7 days    Minutes of Exercise per Session: 60 min  Stress: No Stress Concern Present (09/30/2021)    [employer] of Occupational Health - Occupational Stress Questionnaire    Feeling of Stress : Not at all  Social Connections: Socially Integrated (09/30/2021)   Social Connection and Isolation Panel [NHANES]    Frequency of Communication with Friends and Family: More than three times a week    Frequency of Social Gatherings with Friends and Family: More than three times a week    Attends Religious Services: More than 4 times per year    Active Member of Clubs or Organizations: Yes    Attends Banker Meetings: More than 4 times per year    Marital Status: Married  Catering manager Violence: Not At Risk (09/30/2021)   Humiliation, Afraid, Rape, and Kick questionnaire    Fear of Current or Ex-Partner: No    Emotionally Abused: No    Physically Abused: No    Sexually Abused: No    Past Surgical History:  Procedure Laterality Date   COLONOSCOPY  11/08/2006   HARDWARE REMOVAL Right 10/23/2015   Procedure: RIGHT KNEE HARDWARE REMOVAL;  Surgeon: Ollen Gross, MD;  Location: [facility];  Service: Orthopedics;  Laterality: Right;  LMA   HERNIA REPAIR  1980   left inguinal hernia   LAPAROTOMY  07/18/2011   Procedure: EXPLORATORY LAPAROTOMY;  Surgeon: Mariella Saa, MD;  Location: [facility];  Service: General;  Laterality: N/A;  lysis of adhesions entero enterostomy   LAPAROTOMY  07/28/2011   Procedure: EXPLORATORY LAPAROTOMY;  Surgeon: Mariella Saa, MD;  Location: [facility];  Service: General;  Laterality: N/A;   Left Knee surgery  1994, 1992   ORIF PATELLA Right 12/14/2014   Procedure: OPEN REDUCTION INTERNAL (ORIF) FIXATION PATELLA;  Surgeon: Ollen Gross, MD;  Location: [facility];  Service: Orthopedics;  Laterality: Right;   Surgery for imperforate anus  1958   TONSILLECTOMY     UPPER GASTROINTESTINAL ENDOSCOPY  05/15/03    Family History  Problem Relation Age of Onset   Alzheimer's disease Father    Diabetes Mother    Cancer Mother    Colon cancer Neg Hx    Prostate  cancer Neg Hx     No Known Allergies  Current Outpatient Medications on File Prior to Visit  Medication Sig Dispense Refill   acetaminophen (TYLENOL 8 HOUR) 650 MG CR tablet Take 1 tablet (650 mg total) by mouth every 8 (eight) hours as needed for pain. Take for 10 days. 100 tablet 0   diclofenac Sodium (VOLTAREN) 1 % GEL Apply 2 grams topically 4 (four) times daily to affected joint. 100 g 11   levocetirizine (XYZAL) 5 MG tablet Take 1 tablet (5 mg total) by mouth every evening. 30 tablet 0   pregabalin (LYRICA) 75 MG capsule Take 1 capsule (75 mg total) by mouth daily. 30 capsule 1   traMADol (  ULTRAM) 50 MG tablet 1-2 tab po q 6 hours prn severe pain 135 tablet 0   No current facility-administered medications on file prior to visit.    BP 114/78   Pulse 70   Temp 98.2 F (36.8 C)   Resp 18   Ht 5\' 7"  (1.702 m)   Wt 117 lb (53.1 kg)   SpO2 100%   BMI 18.32 kg/m         Objective:   Physical Exam  General- No acute distress. Pleasant patient. Neck- Full range of motion, no jvd Lungs- Clear, even and unlabored. Heart- regular rate and rhythm. Neurologic- CNII- XII grossly intact.  Abdomen- soft, nt, nd, +bs, no rebound or guarding. Does have old surgical scars. Back- no cva tenderness.      Assessment & Plan:  15 bm recently daily described as looser. Concern for possible overflow diarrhea. Increased frequency of bowel movements (15 times/day) for the past 3-4 days. Stools described as soft and oily, but not watery. No recent antibiotics or constipation. History of bowel obstruction and imperforate anus. Previous recommendation for ileostomy by GI specialist in 2022, but patient declined and has been well until recently. -Order abdominal x-ray to assess stool burden. -Order stool studies to rule out infectious causes. -Consider Linzess prescription based on results of x-ray and stool studies. -Consider referral back to GI specialist if symptoms worsen. -Continue  Tramadol as prescribed.(for chronic neck pain)  Will also get cbc and cmp today  Follow up date to be determined after lab review   Time spent with patient today was 43  minutes which consisted of chart review, discussing diagnosis, work up treatment and documentation.

## 2023-05-24 NOTE — Patient Instructions (Addendum)
15 bm recently daily described as looser. Concern for possible overflow diarrhea. Increased frequency of bowel movements (15 times/day) for the past 3-4 days. Stools described as soft and oily, but not watery. No recent antibiotics or constipation. History of bowel obstruction and imperforate anus. Previous recommendation for ileostomy by GI specialist in 2022, but patient declined and has been well until recently. -Order abdominal x-ray to assess stool burden. -Order stool studies to rule out infectious causes. -Consider Linzess prescription based on results of x-ray and stool studies. -Consider referral back to GI specialist if symptoms worsen. -Continue Tramadol as prescribed.(for chronic neck pain)  -determined treatment changes after studies back. - make sure staying well hydrated  Will also get cbc and cmp today  Follow up date to be determined after lab review

## 2023-05-27 ENCOUNTER — Telehealth: Payer: Self-pay | Admitting: Medical

## 2023-05-27 NOTE — Telephone Encounter (Signed)
 Copied from CRM [id number]. Topic: General - Other >> May 27, 2023  9:34 AM Denese Killings wrote: Reason for CRM: Patient wanting to know if his cast wing has arrived so he can pick it up.

## 2023-05-27 NOTE — Telephone Encounter (Signed)
 Pt stated he thought he was under the impression he had to give stool samples and wanted to know when he is able to pick those up

## 2023-05-28 ENCOUNTER — Telehealth: Payer: Self-pay

## 2023-05-28 MED ORDER — LINACLOTIDE 145 MCG PO CAPS: 145.0000 ug | ORAL_CAPSULE | Freq: Every day | ORAL | 0 refills | Status: DC

## 2023-05-28 NOTE — Telephone Encounter (Signed)
 Pt requesting the Linzess      Copied from CRM [id number]. Topic: Clinical - Prescription Issue >> May 28, 2023 12:44 PM Benton KIDD wrote: Reason for CRM:  patient say doctor prescribed some medication and the pharmacy has not received it. Patient is needing medication. Patient doesn't know the name of the medication that was prescribed .medication for gut issue

## 2023-05-28 NOTE — Telephone Encounter (Signed)
 Pt called and lvm to return call

## 2023-05-31 NOTE — Telephone Encounter (Signed)
 Pt stated he did pick up the medication today , and will try miralax prior linzess. Advised pt to call back if any other issues , voiced understanding

## 2023-05-31 NOTE — Telephone Encounter (Unsigned)
 Copied from CRM [id number]. Topic: General - Other >> May 31, 2023  1:10 PM Chantha C wrote: Reason for CRM: Patient is calling the office back, please call back [phone]. Patient does not know the name of the medication nor can provided any other details, patient states his doctor should know.

## 2023-05-31 NOTE — Telephone Encounter (Signed)
 Pt called , voicemail not set up yet

## 2023-06-04 NOTE — Telephone Encounter (Signed)
 Copied from CRM [id number]. Topic: Clinical - Medication Question >> Jun 04, 2023  9:15 AM Deaijah H wrote: Reason for CRM: Patient called in and would like dr to prescribe a lesser cost medication for his gut/digestion (didn't know the name) and also would like to know if he can increase his prescription traMADol  (ULTRAM ) 50 MG tablet to 5 a day instead of 4 a day / call back # [phone]

## 2023-06-07 ENCOUNTER — Telehealth: Payer: Self-pay

## 2023-06-07 NOTE — Telephone Encounter (Signed)
 Copied from CRM [id number]. Topic: Clinical - Medical Advice >> Jun 07, 2023 12:02 PM Elizebeth Brooking wrote: Reason for CRM: Patient called in wanting to report that he is taking 5 a day of traMADol (ULTRAM) 50 MG tablet , would like a callback to discuss

## 2023-06-08 ENCOUNTER — Other Ambulatory Visit: Payer: Self-pay | Admitting: Medical

## 2023-06-08 NOTE — Telephone Encounter (Signed)
 Requesting: tramadol 50mg   Contract: 02/01/23 UDS: 02/01/23 Last Visit: 05/24/23 Next Visit: None Last Refill: 05/12/23 #135 and 0RF   Please Advise

## 2023-06-11 MED ORDER — LINACLOTIDE 145 MCG PO CAPS
145.0000 ug | ORAL_CAPSULE | Freq: Every day | ORAL | 0 refills | Status: DC
  Filled 2023-06-11: qty 30, 30d supply, fill #0

## 2023-06-11 NOTE — Addendum Note (Signed)
Addended by: Gwenevere Abbot on: 06/11/2023 09:04 PM   Modules accepted: Orders

## 2023-06-14 ENCOUNTER — Other Ambulatory Visit (HOSPITAL_BASED_OUTPATIENT_CLINIC_OR_DEPARTMENT_OTHER): Payer: Self-pay

## 2023-06-14 NOTE — Telephone Encounter (Signed)
Pt called and lvm to return call for message below

## 2023-06-15 NOTE — Telephone Encounter (Signed)
Pt called and voicemail full

## 2023-06-28 ENCOUNTER — Other Ambulatory Visit (HOSPITAL_BASED_OUTPATIENT_CLINIC_OR_DEPARTMENT_OTHER): Payer: Self-pay

## 2023-07-12 ENCOUNTER — Other Ambulatory Visit: Payer: Self-pay | Admitting: Medical

## 2023-07-12 NOTE — Telephone Encounter (Unsigned)
Copied from CRM [id number]. Topic: Clinical - Medication Refill >> Jul 12, 2023  3:07 PM Clayton Bibles wrote: Most Recent Primary Care Visit:  Provider: Esperanza Richters  Department: [facility]  Visit Type: OFFICE VISIT  Date: 05/24/2023  Medication: traMADol (ULTRAM) 50 MG tablet   Has the patient contacted their pharmacy? Yes (Agent: If no, request that the patient contact the pharmacy for the refill. If patient does not wish to contact the pharmacy document the reason why and proceed with request.) (Agent: If yes, when and what did the pharmacy advise?) Pharmacy needs a doctor order to refill  Is this the correct pharmacy for this prescription? Yes - [facility] If no, delete pharmacy and type the correct one.  This is the patient's preferred pharmacy:  [facility] [id number] [place], [place] - [address] [place] [address] Phone: [phone] Fax: [phone]   Has the prescription been filled recently? No  Is the patient out of the medication? Yes - He has been out for 2 days  Has the patient been seen for an appointment in the last year OR does the patient have an upcoming appointment? Yes  Can we respond through MyChart? No  Agent: Please be advised that Rx refills may take up to 3 business days. We ask that you follow-up with your pharmacy.

## 2023-07-13 ENCOUNTER — Other Ambulatory Visit: Payer: Self-pay | Admitting: Medical

## 2023-07-13 NOTE — Telephone Encounter (Signed)
Last Fill: 06/08/23 135 tabs/0 refill  Last OV: 05/24/23 Next OV: None Scheduled  Routing to provider for review/authorization.

## 2023-07-13 NOTE — Telephone Encounter (Signed)
Copied from CRM [id number]. Topic: Clinical - Medication Refill >> Jul 13, 2023 10:17 AM Thomes Dinning wrote: Most Recent Primary Care Visit:  Provider: Esperanza Richters  Department: [facility]  Visit Type: OFFICE VISIT  Date: 05/24/2023  Medication: traMADol (ULTRAM) 50 MG tablet  Has the patient contacted their pharmacy? Yes (Agent: If no, request that the patient contact the pharmacy for the refill. If patient does not wish to contact the pharmacy document the reason why and proceed with request.) (Agent: If yes, when and what did the pharmacy advise?)  Is this the correct pharmacy for this prescription? Yes If no, delete pharmacy and type the correct one.  This is the patient's preferred pharmacy:  [facility] [id number] [place], [place] - [address] [place] [address] Phone: [phone] Fax: [phone]   Has the prescription been filled recently? No  Is the patient out of the medication? Yes  Has the patient been seen for an appointment in the last year OR does the patient have an upcoming appointment? No  Can we respond through MyChart? No  Agent: Please be advised that Rx refills may take up to 3 business days. We ask that you follow-up with your pharmacy.

## 2023-07-13 NOTE — Telephone Encounter (Signed)
Requesting: tramadol 50mg   Contract: 02/09/23 UDS: 02/01/23 Last Visit: 05/24/23 Next Visit: None Last Refill: 06/08/23 #135 and 0RF   Please Advise

## 2023-07-14 MED ORDER — TRAMADOL HCL 50 MG PO TABS: ORAL_TABLET | ORAL | 0 refills | Status: DC

## 2023-07-14 NOTE — Telephone Encounter (Signed)
 Rx refill sent.

## 2023-08-12 ENCOUNTER — Other Ambulatory Visit: Payer: Self-pay | Admitting: Medical

## 2023-08-12 NOTE — Telephone Encounter (Signed)
 Requesting: Tramadol 50 mg Contract: 02/01/2023 UDS: 02/01/2023 Last Visit: 05/24/2023 Next Visit: N/A Last Refill: 07/14/2023  Please Advise

## 2023-08-13 NOTE — Telephone Encounter (Signed)
Rx refill sent to patients pharmacy  

## 2023-09-14 ENCOUNTER — Other Ambulatory Visit: Payer: Self-pay | Admitting: Medical

## 2023-09-17 NOTE — Telephone Encounter (Signed)
 Rx refill sent to pharmacy. Needs controlled med visit with me within a month

## 2023-09-20 ENCOUNTER — Ambulatory Visit: Admitting: Medical

## 2023-09-21 ENCOUNTER — Ambulatory Visit (INDEPENDENT_AMBULATORY_CARE_PROVIDER_SITE_OTHER): Admitting: Medical

## 2023-09-21 VITALS — BP 116/66 | HR 62 | Temp 98.0°F | Resp 18 | Ht 67.0 in | Wt 120.4 lb

## 2023-09-21 DIAGNOSIS — Z79899 Other long term (current) drug therapy: Secondary | ICD-10-CM | POA: Diagnosis not present

## 2023-09-21 DIAGNOSIS — G8929 Other chronic pain: Secondary | ICD-10-CM

## 2023-09-21 DIAGNOSIS — M25511 Pain in right shoulder: Secondary | ICD-10-CM | POA: Diagnosis not present

## 2023-09-21 DIAGNOSIS — M25512 Pain in left shoulder: Secondary | ICD-10-CM

## 2023-09-21 DIAGNOSIS — D649 Anemia, unspecified: Secondary | ICD-10-CM

## 2023-09-21 DIAGNOSIS — M542 Cervicalgia: Secondary | ICD-10-CM | POA: Diagnosis not present

## 2023-09-21 DIAGNOSIS — R739 Hyperglycemia, unspecified: Secondary | ICD-10-CM

## 2023-09-21 LAB — COMPREHENSIVE METABOLIC PANEL WITH GFR
ALT: 15 U/L (ref 0–53)
AST: 23 U/L (ref 0–37)
Albumin: 4.4 g/dL (ref 3.5–5.2)
Alkaline Phosphatase: 67 U/L (ref 39–117)
BUN: 15 mg/dL (ref 6–23)
CO2: 33 meq/L — ABNORMAL HIGH (ref 19–32)
Calcium: 9.3 mg/dL (ref 8.4–10.5)
Chloride: 101 meq/L (ref 96–112)
Creatinine, Ser: 0.91 mg/dL (ref 0.40–1.50)
GFR: 87.69 mL/min (ref >60.00)
Glucose, Bld: 78 mg/dL (ref 70–99)
Potassium: 4 meq/L (ref 3.5–5.1)
Sodium: 137 meq/L (ref 135–145)
Total Bilirubin: 0.5 mg/dL (ref 0.2–1.2)
Total Protein: 6.3 g/dL (ref 6.0–8.3)

## 2023-09-21 LAB — HEMOGLOBIN A1C: Hgb A1c MFr Bld: 5.7 % (ref 4.6–6.5)

## 2023-09-21 LAB — CBC WITH DIFFERENTIAL/PLATELET
Basophils Absolute: 0.1 10*3/uL (ref 0.0–0.1)
Basophils Relative: 1.9 % (ref 0.0–3.0)
Eosinophils Absolute: 0.4 10*3/uL (ref 0.0–0.7)
Eosinophils Relative: 9.1 % — ABNORMAL HIGH (ref 0.0–5.0)
HCT: 41.6 % (ref 39.0–52.0)
Hemoglobin: 13.8 g/dL (ref 13.0–17.0)
Lymphocytes Relative: 25.6 % (ref 12.0–46.0)
Lymphs Abs: 1.1 10*3/uL (ref 0.7–4.0)
MCHC: 33.2 g/dL (ref 30.0–36.0)
MCV: 87.7 fl (ref 78.0–100.0)
Monocytes Absolute: 0.3 10*3/uL (ref 0.1–1.0)
Monocytes Relative: 7.6 % (ref 3.0–12.0)
Neutro Abs: 2.5 10*3/uL (ref 1.4–7.7)
Neutrophils Relative %: 55.8 % (ref 43.0–77.0)
Platelets: 260 10*3/uL (ref 150.0–400.0)
RBC: 4.74 Mil/uL (ref 4.22–5.81)
RDW: 13.2 % (ref 11.5–15.5)
WBC: 4.4 10*3/uL (ref 4.0–10.5)

## 2023-09-21 NOTE — Progress Notes (Signed)
 Subjective:    Patient ID: [name], male    DOB: 06/22/1955, 68 y.o.   MRN: [id number]  HPI  Pt has chronic pain in both cervical and in neck. He has  daily neck and shoulder pain, rated 7-8/10. 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 No recent radicular symptoms. Neurosurgical consultation in the past but no surgery performed. Pt has tried tramadol  and found more effective than nocro.  Pt tramadol  has been 50 mg 135 tab sig Sig: TAKE 1 TO 2 TABLETS BY MOUTH EVERY 6 HOURS AS NEEDED FOR SEVERE PAIN **DO NOT EXCEED 4 AND 1/2 TABLETS PER DAY**   Imaging studies.  IMPRESSION: 1. 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 C6 is virtually the only normally formed cervical vertebrae (although demonstrates spina bifida occulta). No acute osseous abnormality in the spine.   2. 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 Mild spinal stenosis at both levels with up to mild spinal cord mass effect. Moderate to severe C6 neural foraminal stenosis suspected although better depicted on the January MRI.   3. C1 level spinal stenosis with up to mild cord mass effect, and other mild bilateral cervical foraminal stenosis, which are unrelated to disc disease.   4. No significant thoracic spinal stenosis. There is moderate to severe osseous neural foraminal stenosis on the left at the T2 and T3 nerve levels.  Also has tendinitis of rotator cuffs.   Pt on contract and on uds.  Hx of constipation. 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 He was last seen in clinic in July. Colonoscopy was completed 12/29/2018 with poor prep, but no obvious lesions.   Pt states no abdomen issues. Has bm everyday even on tramadol .  Pt got tramdol refill  just recently.  Pt declines shingrix vaccine today.  Elevated sugar- in past.  Mild anemia on review in past.  On review lipid panel in past normal. No dx of high cholesterol in past.  Pt declines shingrix. States not planning to ever get.    Review of Systems  Constitutional:  Negative for chills, fatigue and fever.  Respiratory:  Negative for cough, chest tightness, shortness of breath and wheezing.   Cardiovascular:  Negative for chest pain and palpitations.  Gastrointestinal:  Negative for abdominal pain, constipation, diarrhea and vomiting.  Musculoskeletal:  Positive for neck pain. Negative for back pain and joint swelling.       Shoulder pain  Skin:  Negative for rash.  Neurological:  Negative for dizziness and light-headedness.  Hematological:  Negative for adenopathy. Does not bruise/bleed easily.  Psychiatric/Behavioral:  Negative for behavioral problems, confusion and decreased concentration.    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   Pneumonia    childhood      Social History   Socioeconomic History   Marital status: Single    Spouse name: Not on file  Number of children: 0   Years of education: Not on file   Highest education level: Not on file  Occupational History   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  Tobacco Use   Smoking status: Never   Smokeless tobacco: Never  Vaping Use   Vaping status: Never Used  Substance and Sexual Activity   Alcohol use: No    Alcohol/week: 0.0 standard drinks of alcohol   Drug use: No   Sexual activity: Yes  Other Topics Concern   Not on file  Social  History Narrative   Lives w/ mother , has a younger brother and sister    Social Drivers of Corporate investment banker Strain: Low Risk  (09/30/2021)   Overall Financial Resource Strain (CARDIA)    Difficulty of Paying Living Expenses: Not hard at all  Food Insecurity: No Food Insecurity (09/30/2021)   Hunger Vital Sign    Worried About Running Out of Food in the Last Year: Never true    Ran Out of Food in the Last Year: Never true  Transportation Needs: No Transportation Needs (09/30/2021)   PRAPARE - Administrator, Civil Service (Medical): No    Lack of Transportation (Non-Medical): No  Physical Activity: Sufficiently Active (09/30/2021)   Exercise Vital Sign    Days of Exercise per Week: 7 days    Minutes of Exercise per Session: 60 min  Stress: No Stress Concern Present (09/30/2021)   [employer] of Occupational Health - Occupational Stress Questionnaire    Feeling of Stress : Not at all  Social Connections: Socially Integrated (09/30/2021)   Social Connection and Isolation Panel [NHANES]    Frequency of Communication with Friends and Family: More than three times a week    Frequency of Social Gatherings with Friends and Family: More than three times a week    Attends Religious Services: More than 4 times per year    Active Member of Clubs or Organizations: Yes    Attends Banker Meetings: More than 4 times per year    Marital Status: Married  Catering manager Violence: Not At Risk (09/30/2021)   Humiliation, Afraid, Rape, and Kick questionnaire    Fear of Current or Ex-Partner: No    Emotionally Abused: No    Physically Abused: No    Sexually Abused: No    Past Surgical History:  Procedure Laterality Date   COLONOSCOPY  11/08/2006   HARDWARE REMOVAL Right 10/23/2015   Procedure: RIGHT KNEE HARDWARE REMOVAL;  Surgeon: Liliane Rei, MD;  Location: [facility];  Service: Orthopedics;  Laterality: Right;  LMA   HERNIA REPAIR  1980   left inguinal hernia    LAPAROTOMY  07/18/2011   Procedure: EXPLORATORY LAPAROTOMY;  Surgeon: Quitman Bucy, MD;  Location: [facility];  Service: General;  Laterality: N/A;  lysis of adhesions entero enterostomy   LAPAROTOMY  07/28/2011   Procedure: EXPLORATORY LAPAROTOMY;  Surgeon: Quitman Bucy, MD;  Location: [facility];  Service: General;  Laterality: N/A;   Left Knee surgery  1994, 1992   ORIF PATELLA Right 12/14/2014   Procedure: OPEN REDUCTION INTERNAL (ORIF) FIXATION PATELLA;  Surgeon: Liliane Rei, MD;  Location: [facility];  Service: Orthopedics;  Laterality: Right;   Surgery for imperforate anus  1958   TONSILLECTOMY     UPPER GASTROINTESTINAL ENDOSCOPY  05/15/03    Family History  Problem Relation Age of Onset   Alzheimer's disease Father    Diabetes Mother  Cancer Mother    Colon cancer Neg Hx    Prostate cancer Neg Hx     No Known Allergies  Current Outpatient Medications on File Prior to Visit  Medication Sig Dispense Refill   acetaminophen  (TYLENOL  8 HOUR) 650 MG CR tablet Take 1 tablet (650 mg total) by mouth every 8 (eight) hours as needed for pain. Take for 10 days. 100 tablet 0   diclofenac  Sodium (VOLTAREN ) 1 % GEL Apply 2 grams topically 4 (four) times daily to affected joint. 100 g 11   levocetirizine (XYZAL ) 5 MG tablet Take 1 tablet (5 mg total) by mouth every evening. 30 tablet 0   linaclotide  (LINZESS ) 145 MCG CAPS capsule Take 1 capsule (145 mcg total) by mouth daily before breakfast. 30 capsule 0   traMADol  (ULTRAM ) 50 MG tablet TAKE 1 TO 2 TABLETS BY MOUTH EVERY 6 HOURS AS NEEDED FOR SEVERE PAIN **DO NOT EXCEED 4 AND 1/2 TABLETS PER DAY** 135 tablet 0   No current facility-administered medications on file prior to visit.    BP 116/66   Pulse 62   Temp 98 F (36.7 C)   Resp 18   Ht 5\' 7"  (1.702 m)   Wt 120 lb 6.4 oz (54.6 kg)   SpO2 99%   BMI 18.86 kg/m        Objective:   Physical Exam      General Mental Status- Alert. General Appearance- Not in acute  distress.    Skin General: Color- Normal Color. Moisture- Normal Moisture.   Neck Carotid Arteries- Normal color. Moisture- Normal Moisture. No carotid bruits. No JVD.   Chest and Lung Exam Auscultation: Breath Sounds:-Normal.   Cardiovascular Auscultation:Rythm- Regular. Murmurs & Other Heart Sounds:Auscultation of the heart reveals- No Murmurs.   Abdomen Inspection:-Inspeection Normal. Palpation/Percussion:Note:No mass. Palpation and Percussion of the abdomen reveal- Non Tender, Non Distended + BS, no rebound or guarding.     Neurologic Cranial Nerve exam:- CN III-XII intact(No nystagmus), symmetric smile. Strength:- 5/5 equal and symmetric strength both upper and lower extremities.      Assessment & Plan:   Patient Instructions  1. Elevated blood sugar (Primary) -recommend low sugar diet and exercise - Comp Met (CMET) - Hemoglobin A1c  2. Anemia, unspecified type -mild anemia in past. Will recheck today - CBC w/Diff  3. High risk medication use, Chronic pain of both shoulders  And Neck pain -up to date on contract and uds. -current rx of tramadol  controls pain.  4- Hx of constipation but normal bm daily recently.  Follow up September or sooner if needed     [employer], PA-C

## 2023-09-21 NOTE — Patient Instructions (Signed)
 1. Elevated blood sugar (Primary) -recommend low sugar diet and exercise - Comp Met (CMET) - Hemoglobin A1c  2. Anemia, unspecified type -mild anemia in past. Will recheck today - CBC w/Diff  3. High risk medication use, Chronic pain of both shoulders  And Neck pain -up to date on contract and uds. -current rx of tramadol  controls pain.  4- Hx of constipation but normal bm daily recently.  Follow up September or sooner if needed

## 2023-10-21 ENCOUNTER — Other Ambulatory Visit: Payer: Self-pay | Admitting: Medical

## 2023-10-21 NOTE — Telephone Encounter (Signed)
 Requesting: tramadol  50mg   Contract: 02/01/23 UDS: 02/01/23 Last Visit: 09/21/23 Next Visit: None Last Refill: 09/17/23 #135 and 0RF   Please Advise

## 2023-10-22 NOTE — Telephone Encounter (Signed)
 Rx refill sent.

## 2023-11-01 ENCOUNTER — Telehealth: Payer: Self-pay

## 2023-11-01 NOTE — Telephone Encounter (Signed)
 Copied from CRM [id number]. Topic: Clinical - Medication Question >> Nov 01, 2023 10:01 AM Baldo Levan wrote: Reason for CRM: Patient is requesting to slowly come off of taking the  traMADol  (ULTRAM ) 50 MG tablet [[id number]]. Patient is requesting advice on how to slowly come off of this medication.

## 2023-11-02 NOTE — Telephone Encounter (Signed)
 Pt called and lvm to return call

## 2023-12-05 ENCOUNTER — Other Ambulatory Visit: Payer: Self-pay | Admitting: Medical

## 2023-12-07 ENCOUNTER — Ambulatory Visit: Admitting: Internal Medicine

## 2023-12-07 ENCOUNTER — Encounter: Payer: Self-pay | Admitting: Medical

## 2023-12-08 NOTE — Telephone Encounter (Signed)
 Rx tramdol refill sent

## 2024-01-17 ENCOUNTER — Ambulatory Visit: Payer: Self-pay

## 2024-01-17 NOTE — Telephone Encounter (Signed)
 FYI Only or Action Required?: FYI only for provider.  Patient was last seen in primary care on 09/21/2023 by Dorina Loving, PA-C.  Called Nurse Triage reporting Dizziness.  Symptoms began a week ago.  Interventions attempted: Rest, hydration, or home remedies.  Symptoms are: gradually worsening.  Triage Disposition: See PCP When Office is Open (Within 3 Days)  Patient/caregiver understands and will follow disposition?: Yes        Copied from CRM #[id number]. Topic: Clinical - Red Word Triage >> Jan 17, 2024  2:41 PM Lavanda D wrote: Red Word that prompted transfer to Nurse Triage: Patient has been exeriencing light headedness and has been a little bit tired. He has had the light headedness for about a week now.        Reason for Disposition  [1] MILD dizziness (e.g., walking normally) AND [2] has NOT been evaluated by doctor (or NP/PA) for this  (Exception: Dizziness caused by heat exposure, sudden standing, or poor fluid intake.)  Answer Assessment - Initial Assessment Questions 1. DESCRIPTION: Describe your dizziness.     Lightheadedness 2. LIGHTHEADED: Do you feel lightheaded? (e.g., somewhat faint, woozy, weak upon standing)     Yes 3. VERTIGO: Do you feel like either you or the room is spinning or tilting? (i.e., vertigo)     No 4. SEVERITY: How bad is it?  Do you feel like you are going to faint? Can you stand and walk?     Mild  5. ONSET:  When did the dizziness begin?     1 week ago  6. AGGRAVATING FACTORS: Does anything make it worse? (e.g., standing, change in head position)     No 7. HEART RATE: Can you tell me your heart rate? How many beats in 15 seconds?  (Note: Not all patients can do this.)       No 8. CAUSE: What do you think is causing the dizziness? (e.g., decreased fluids or food, diarrhea, emotional distress, heat exposure, new medicine, sudden standing, vomiting; unknown)     No 9. RECURRENT SYMPTOM: Have you had dizziness  before? If Yes, ask: When was the last time? What happened that time?     No 10. OTHER SYMPTOMS: Do you have any other symptoms? (e.g., fever, chest pain, vomiting, diarrhea, bleeding)       No  Protocols used: Dizziness - Lightheadedness-A-AH

## 2024-01-18 ENCOUNTER — Ambulatory Visit (INDEPENDENT_AMBULATORY_CARE_PROVIDER_SITE_OTHER): Admitting: Medical

## 2024-01-18 VITALS — BP 110/84 | HR 72 | Resp 18 | Ht 67.0 in | Wt 118.0 lb

## 2024-01-18 DIAGNOSIS — M542 Cervicalgia: Secondary | ICD-10-CM

## 2024-01-18 DIAGNOSIS — Z79899 Other long term (current) drug therapy: Secondary | ICD-10-CM

## 2024-01-18 DIAGNOSIS — R42 Dizziness and giddiness: Secondary | ICD-10-CM

## 2024-01-18 DIAGNOSIS — H814 Vertigo of central origin: Secondary | ICD-10-CM

## 2024-01-18 MED ORDER — CYCLOBENZAPRINE HCL 5 MG PO TABS: 5.0000 mg | ORAL_TABLET | Freq: Every day | ORAL | 0 refills | Status: DC

## 2024-01-18 NOTE — Progress Notes (Signed)
 Subjective:    Patient ID: [name], male    DOB: 1956/05/17, 68 y.o.   MRN: [id number]  HPI  [name] is a 68 year old male with cervical spine issues who presents with worsening neck pain and lightheadedness.  He has a history of cervical spine issues, including C1 level spinal stenosis with mild cord mass effect and bilateral cervical foraminal stenosis. Over the past four weeks, he has experienced worsening neck pain, described as a dull ache, despite taking tramadol . The pain is located in the middle of the neck and is associated with stiffness, without radiation down the arms.  In January 2021, he was evaluated by a neurosurgeon who noted severe advanced spondylitis changes and mild cord flattening. At that time, there were no symptoms of myelopathy. The patient had in past briefly experienced sensory changes in the thumb and index finger. He is currently taking tramadol  for pain management, which provides some relief but does not completely alleviate the symptoms. He has previously tried hydrocodone , which was less effective and caused concerns due to his gastrointestinal history, including a past small bowel obstruction and surgery for an imperforate anus.  In addition to neck pain, he reports experiencing intermittent lightheadedness over the past couple of weeks, which is more prevalent lately. The lightheadedness is described as a low-level sensation without spinning, headache, nausea, vomiting, weakness, vision changes, or palpitations. It tends to improve when lying down. No chest pain or changes in symptoms with position changes.      Review of Systems  Constitutional:  Negative for chills, fatigue and fever.  Respiratory:  Negative for cough, chest tightness, shortness of breath and wheezing.   Cardiovascular:  Negative for chest pain and palpitations.  Gastrointestinal:  Negative for abdominal pain, diarrhea and vomiting.  Genitourinary:  Negative for dysuria and  flank pain.  Musculoskeletal:  Positive for neck pain. Negative for back pain, joint swelling and neck stiffness.  Skin:  Negative for rash.  Neurological:  Positive for dizziness. Negative for syncope, weakness and light-headedness.  Hematological:  Negative for adenopathy. Does not bruise/bleed easily.  Psychiatric/Behavioral:  Negative for behavioral problems and dysphoric mood. The patient is not nervous/anxious.     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   Pneumonia    childhood      Social History   Socioeconomic History   Marital status: Single    Spouse name: Not on file   Number of children: 0   Years of education: Not on file   Highest education level: Not on file  Occupational History   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  Tobacco Use   Smoking status: Never   Smokeless tobacco: Never  Vaping Use   Vaping status: Never Used  Substance and Sexual Activity   Alcohol use: No    Alcohol/week: 0.0 standard drinks of alcohol   Drug use: No   Sexual activity: Yes  Other Topics Concern   Not on file  Social History Narrative   Lives w/ mother , has a younger brother and  sister    Social Drivers of Corporate investment banker Strain: Low Risk  (09/30/2021)   Overall Financial Resource Strain (CARDIA)    Difficulty of Paying Living Expenses: Not hard at all  Food Insecurity: No Food Insecurity (09/30/2021)   Hunger Vital Sign    Worried About Running Out of Food in the Last Year: Never true    Ran Out of Food in the Last Year: Never true  Transportation Needs: No Transportation Needs (09/30/2021)   PRAPARE -  Administrator, Civil Service (Medical): No    Lack of Transportation (Non-Medical): No  Physical Activity: Sufficiently Active (09/30/2021)   Exercise Vital Sign    Days of Exercise per Week: 7 days    Minutes of Exercise per Session: 60 min  Stress: No Stress Concern Present (09/30/2021)   [employer] of Occupational Health - Occupational Stress Questionnaire    Feeling of Stress : Not at all  Social Connections: Socially Integrated (09/30/2021)   Social Connection and Isolation Panel    Frequency of Communication with Friends and Family: More than three times a week    Frequency of Social Gatherings with Friends and Family: More than three times a week    Attends Religious Services: More than 4 times per year    Active Member of Clubs or Organizations: Yes    Attends Banker Meetings: More than 4 times per year    Marital Status: Married  Catering manager Violence: Not At Risk (09/30/2021)   Humiliation, Afraid, Rape, and Kick questionnaire    Fear of Current or Ex-Partner: No    Emotionally Abused: No    Physically Abused: No    Sexually Abused: No    Past Surgical History:  Procedure Laterality Date   COLONOSCOPY  11/08/2006   HARDWARE REMOVAL Right 10/23/2015   Procedure: RIGHT KNEE HARDWARE REMOVAL;  Surgeon: Dempsey Moan, MD;  Location: [facility];  Service: Orthopedics;  Laterality: Right;  LMA   HERNIA REPAIR  1980   left inguinal hernia   LAPAROTOMY  07/18/2011   Procedure: EXPLORATORY LAPAROTOMY;  Surgeon: Morene ONEIDA Olives, MD;  Location: [facility];  Service: General;  Laterality: N/A;  lysis of adhesions entero enterostomy   LAPAROTOMY  07/28/2011   Procedure: EXPLORATORY LAPAROTOMY;  Surgeon: Morene ONEIDA Olives, MD;  Location: [facility];  Service: General;  Laterality: N/A;   Left Knee surgery  1994, 1992   ORIF PATELLA Right 12/14/2014   Procedure: OPEN REDUCTION INTERNAL (ORIF) FIXATION PATELLA;  Surgeon: Dempsey Moan, MD;  Location: [facility];   Service: Orthopedics;  Laterality: Right;   Surgery for imperforate anus  1958   TONSILLECTOMY     UPPER GASTROINTESTINAL ENDOSCOPY  05/15/03    Family History  Problem Relation Age of Onset   Alzheimer's disease Father    Diabetes Mother    Cancer Mother    Colon cancer Neg Hx    Prostate cancer Neg Hx     No Known Allergies  Current Outpatient Medications on File Prior to Visit  Medication Sig Dispense Refill   acetaminophen  (TYLENOL  8 HOUR) 650 MG CR tablet Take 1 tablet (650 mg total) by mouth every 8 (eight) hours as needed for pain. Take for 10 days. 100 tablet 0   diclofenac  Sodium (VOLTAREN ) 1 % GEL Apply 2 grams topically 4 (four) times daily to affected joint. 100 g 11   levocetirizine (XYZAL ) 5 MG tablet Take 1 tablet (5 mg total) by mouth every  evening. 30 tablet 0   linaclotide  (LINZESS ) 145 MCG CAPS capsule Take 1 capsule (145 mcg total) by mouth daily before breakfast. 30 capsule 0   traMADol  (ULTRAM ) 50 MG tablet TAKE 1 TO 2 TABLETS BY MOUTH EVERY 6 HOURS AS NEEDED FOR SEVERE PAIN *DO NOT EXCEED 4 AND A HALF TABLETS PER DAY* 135 tablet 0   No current facility-administered medications on file prior to visit.    BP 110/84   Pulse 72   Resp 18   Ht 5' 7 (1.702 m)   Wt 118 lb (53.5 kg)   SpO2 100%   BMI 18.48 kg/m        Objective:   Physical Exam  General Mental Status- Alert. General Appearance- Not in acute distress.   Skin General: Color- Normal Color. Moisture- Normal Moisture.  Neck Carotid Arteries- Normal color. Moisture- Normal Moisture. No carotid bruits. No JVD.  Chest and Lung Exam Auscultation: Breath Sounds:-CTA  Cardiovascular Auscultation:Rythm- RRR Murmurs & Other Heart Sounds:Auscultation of the heart reveals- No Murmurs.  Abdomen Inspection:-Inspeection Normal. Palpation/Percussion:Note:No mass. Palpation and Percussion of the abdomen reveal- Non Tender, Non Distended + BS, no rebound or guarding.    Neurologic Cranial  Nerve exam:- CN III-XII intact(No nystagmus), symmetric smile. Drift Test:- No drift. Romberg Exam:- Negative.  Heal to Toe Gait exam:-difficulty performing. Finger to Nose:- Normal/Intact Strength:- 5/5 equal and symmetric strength both upper and lower extremities Lying supine no vertigo.         Assessment & Plan:   Patient Instructions  Neck pain Cervical spinal stenosis with spondylosis and cord compression and chronic neck pain. Chronic neck pain worsening, no radicular symptoms or myelopathy. Previous neurosurgical evaluation recommended surgery, but he is hesitant. Pain management with tramadol  suboptimal, hydrocodone  ineffective due to side effects and gastrointestinal history. - Refer to [facility] for further evaluation and management. - Prescribe Flexeril  5 mg tablet at night for neck pain. - Continue tramadol  for pain management. - Update pain management contract and perform urine drug screening today.  Lightheadedness Intermittent lightheadedness, more prevalent lately. No associated vertigo, headache, nausea, vomiting, weakness, vision changes, palpitations, or chest pain. Symptoms improve when lying down. EKG shows sinus bradycardia at 58 bpm.  - Order CT head without contrast to evaluate for neurological causes. - Perform metabolic panel and cbc - Advise to seek emergency care if dizziness worsens with neurological or motor deficits develop. - Consider prescription of meclizine  if dizziness persists, but advise against using with Flexeril .  Follow up in 2-3 weeks or sooner if needed   Trig Mcbryar, PA-C     Time spent with patient today was  45 minutes which consisted of chart review, discussing diagnosis, work up ,treatment and documentation. Total time did not include time to do EKG.

## 2024-01-18 NOTE — Patient Instructions (Signed)
 Neck pain Cervical spinal stenosis with spondylosis and cord compression and chronic neck pain. Chronic neck pain worsening, no radicular symptoms or myelopathy. Previous neurosurgical evaluation recommended surgery, but he is hesitant. Pain management with tramadol  suboptimal, hydrocodone  ineffective due to side effects and gastrointestinal history. - Refer to [facility] for further evaluation and management. - Prescribe Flexeril  5 mg tablet at night for neck pain. - Continue tramadol  for pain management. - Update pain management contract and perform urine drug screening today.  Lightheadedness Intermittent lightheadedness, more prevalent lately. No associated vertigo, headache, nausea, vomiting, weakness, vision changes, palpitations, or chest pain. Symptoms improve when lying down. EKG shows sinus bradycardia at 58 bpm.  - Order CT head without contrast to evaluate for neurological causes. - Perform metabolic panel and cbc - Advise to seek emergency care if dizziness worsens with neurological or motor deficits develop. - Consider prescription of meclizine  if dizziness persists, but advise against using with Flexeril .  Follow up in 2-3 weeks or sooner if needed

## 2024-01-21 ENCOUNTER — Ambulatory Visit (HOSPITAL_BASED_OUTPATIENT_CLINIC_OR_DEPARTMENT_OTHER): Attending: Medical

## 2024-01-25 ENCOUNTER — Other Ambulatory Visit: Payer: Self-pay | Admitting: Medical

## 2024-01-25 NOTE — Telephone Encounter (Signed)
 Copied from CRM #[id number]. Topic: Clinical - Medication Refill >> Jan 25, 2024 12:27 PM Aleatha C wrote: Medication:  cyclobenzaprine  (FLEXERIL ) 5 MG tablet    Has the patient contacted their pharmacy? Yes (Agent: If no, request that the patient contact the pharmacy for the refill. If patient does not wish to contact the pharmacy document the reason why and proceed with request.) (Agent: If yes, when and what did the pharmacy advise?)  This is the patient's preferred pharmacy:  [id number] [place], [place] - [address] [place] [address] Phone: [phone] Fax: [phone]  Is this the correct pharmacy for this prescription? Yes If no, delete pharmacy and type the correct one.   Has the prescription been filled recently? No  Is the patient out of the medication? No 1 pill  Has the patient been seen for an appointment in the last year OR does the patient have an upcoming appointment? Yes  Can we respond through MyChart? No  Agent: Please be advised that Rx refills may take up to 3 business days. We ask that you follow-up with your pharmacy.

## 2024-01-26 MED ORDER — CYCLOBENZAPRINE HCL 5 MG PO TABS: 5.0000 mg | ORAL_TABLET | Freq: Every day | ORAL | 0 refills | Status: DC

## 2024-01-26 NOTE — Telephone Encounter (Signed)
Rx refill tramadol sent to pharmacy.

## 2024-01-31 ENCOUNTER — Telehealth: Payer: Self-pay

## 2024-01-31 NOTE — Telephone Encounter (Signed)
 Copied from CRM [id number]. Topic: Referral - Request for Referral >> Jan 31, 2024 10:40 AM Turkey A wrote: Did the patient discuss referral with their provider in the last year? Yes (If No - schedule appointment) (If Yes - send message)  Appointment offered? No  Type of order/referral and detailed reason for visit: NeuroSurgeon  Preference of office, provider, location: [facility]  If referral order, have you been seen by this specialty before? No (If Yes, this issue or another issue? When? Where?  Can we respond through MyChart? Yes

## 2024-02-01 NOTE — Telephone Encounter (Signed)
 Pt called with no answer and voicemail box not set up will call later

## 2024-02-01 NOTE — Telephone Encounter (Signed)
 Pt called back- E2C2 made Pt aware of referral information.

## 2024-02-01 NOTE — Telephone Encounter (Signed)
 Tried to reach pt again with no answer

## 2024-02-02 ENCOUNTER — Telehealth: Payer: Self-pay

## 2024-02-02 NOTE — Telephone Encounter (Signed)
 Copied from CRM [id number]. Topic: General - Other >> Feb 02, 2024 12:29 PM Mesmerise C wrote: Reason for CRM: Patient stated in order to have an appointment to see a neurosurgeon needed to have mri or xray done

## 2024-02-03 ENCOUNTER — Ambulatory Visit: Payer: Self-pay

## 2024-02-03 NOTE — Telephone Encounter (Signed)
Appt scheduled w/ PCP tomorrow.  

## 2024-02-03 NOTE — Telephone Encounter (Signed)
 FYI Only or Action Required?: Action required by provider: lab or test result follow-up needed. PT is requesting MRI be ordered. PT forgot last appt for MRI.  Patient was last seen in primary care on 01/18/2024 by Dorina Loving, PA-C.  Called Nurse Triage reporting Pain.  Symptoms began ongoing.  Interventions attempted: Prescription medications: as prescribed - not taking muscle relaxer.  Symptoms are: gradually worsening.  Triage Disposition: See PCP When Office is Open (Within 3 Days)  Patient/caregiver understands and will follow disposition?: Yes                  Copied from CRM #[id number]. Topic: Clinical - Red Word Triage >> Feb 03, 2024 11:37 AM Thersia BROCKS wrote: [facility] that prompted transfer to Nurse Triage: Patient called in regarding needing PA Loving Dorina to answer him about needing an MRI or Xray , patient stated he has been calling in waiting for a answer as he is severe pain Reason for Disposition  [1] MODERATE neck pain (e.g., interferes with normal activities) AND [2] present > 3 days  Answer Assessment - Initial Assessment Questions 1. ONSET: When did the pain begin?      ongoing 2. LOCATION: Where does it hurt?      neck 3. PATTERN Does the pain come and go, or has it been constant since it started?      constant 4. SEVERITY: How bad is the pain?  (Scale 0-10; or none or slight stiffness, mild, moderate, severe)     Pt states that pain has increased and that stiffness has increased  Protocols used: Neck Pain or Stiffness-A-AH

## 2024-02-03 NOTE — Telephone Encounter (Signed)
 Appt scheduled

## 2024-02-03 NOTE — Telephone Encounter (Signed)
 [name]   02/03/2024 11:37 AM  Patient called in to followup regarding this, would like a callback on what he needs to do.

## 2024-02-04 ENCOUNTER — Ambulatory Visit (INDEPENDENT_AMBULATORY_CARE_PROVIDER_SITE_OTHER): Admitting: Medical

## 2024-02-04 ENCOUNTER — Ambulatory Visit (HOSPITAL_BASED_OUTPATIENT_CLINIC_OR_DEPARTMENT_OTHER)
Admission: RE | Admit: 2024-02-04 | Discharge: 2024-02-04 | Disposition: A | Discharge status: Home / Self Care | Source: Ambulatory Visit | Attending: Medical | Admitting: Medical

## 2024-02-04 ENCOUNTER — Encounter: Payer: Self-pay | Admitting: Medical

## 2024-02-04 ENCOUNTER — Telehealth: Payer: Self-pay

## 2024-02-04 ENCOUNTER — Ambulatory Visit: Payer: Self-pay | Admitting: Medical

## 2024-02-04 VITALS — BP 108/72 | HR 57 | Temp 97.8°F | Resp 16 | Ht 67.0 in | Wt 112.4 lb

## 2024-02-04 DIAGNOSIS — M542 Cervicalgia: Secondary | ICD-10-CM | POA: Diagnosis present

## 2024-02-04 NOTE — Telephone Encounter (Signed)
 error

## 2024-02-04 NOTE — Progress Notes (Signed)
Results printed and mailed.   

## 2024-02-04 NOTE — Progress Notes (Signed)
   Subjective:    Patient ID: [name], male    DOB: [birth year], 68 y.o.   MRN: [id number]  HPI  [name] is a 68 year old male with chronic cervical spine and degenerative spinal changes who presents for follow-up and imaging coordination.  He has a history of chronic back pain associated with degenerative changes, scoliosis, lordosis, spinal stenosis, and foraminal narrowing. Despite medication management, he experiences occasional pain not managed well. His current pain medication, tramadol , provides adequate relief recently but is less effective than before. He takes tramadol  one to two tablets every six hours, totaling 135 tablets per month. He previously tried Norco but did not find it more effective than tramadol .  He has a history of gastrointestinal issues, including a small bowel obstruction for which he underwent surgery. He is cautious about using stronger narcotics due to the risk of constipation and potential for recurrent obstruction.  He declined the use of Flexeril , preferring to manage his symptoms with tramadol  and occasional Tylenol . Muscle pain occurs primarily at night.  Review of Systems See hpi    Objective:   Physical Exam  General Mental Status- Alert. General Appearance- Not in acute distress.   Skin General: Color- Normal Color. Moisture- Normal Moisture.  Neck  No JVD.  Chest and Lung Exam Auscultation: Breath Sounds: CTA  Cardiovascular Auscultation:Rythm- RRR Murmurs & Other Heart Sounds:Auscultation of the heart reveals- No Murmurs.  Abdomen Inspection:-Inspeection Normal. Palpation/Percussion:Note:No mass. Palpation and Percussion of the abdomen reveal- Non Tender, Non Distended + BS, no rebound or guarding.   Neurologic Cranial Nerve exam:- CN III-XII intact(No nystagmus), symmetric smile. Strength:- 5/5 equal and symmetric strength both upper and lower extremities.       Assessment & Plan:   Chronic neck with cervical  spondylosis, scoliosis, lordosis, spinal stenosis, and foraminal stenosis Chronic pain with significant degenerative changes. Tramadol  provides adequate relief presently. Declined Flexeril . Surgical intervention I think maybe complex Discussed higher level narcotic risks due to GI history. - Order plain x-ray of the cspine stat. - Referred to [facility] for surgical evaluation. - Continue tramadol  as prescribed. - Consider occasional acetaminophen  for additional pain relief. - Discuss potential for opioid-induced constipation management with gastroenterologist if stronger pain management is needed. Based on gi history)  Small bowel obstruction, status post surgery years ago History of obstruction with surgical intervention. Concern for recurrence with stronger narcotics. - Avoid stronger narcotics to minimize risk of bowel obstruction. - Consider consultation with gastroenterologist for management of opioid-induced constipation if stronger pain management becomes necessary.  Follow up date to be determined after neurosurgeon note review  Dallas Maxwell, PA-C

## 2024-02-04 NOTE — Patient Instructions (Signed)
 Chronic neck with cervical spondylosis, scoliosis, lordosis, spinal stenosis, and foraminal stenosis Chronic pain with significant degenerative changes. Tramadol  provides adequate relief presently. Declined Flexeril . Surgical intervention I think maybe complex Discussed higher level narcotic risks due to GI history. - Order plain x-ray of the cspine stat. - Referred to [facility] for surgical evaluation. - Continue tramadol  as prescribed. - Consider occasional acetaminophen  for additional pain relief. - Discuss potential for opioid-induced constipation management with gastroenterologist if stronger pain management is needed. Based on gi history)  Small bowel obstruction, status post surgery years ago History of obstruction with surgical intervention. Concern for recurrence with stronger narcotics. - Avoid stronger narcotics to minimize risk of bowel obstruction. - Consider consultation with gastroenterologist for management of opioid-induced constipation if stronger pain management becomes necessary.  Follow up date to be determined after neurosurgeon note review

## 2024-02-09 ENCOUNTER — Ambulatory Visit: Admitting: Podiatry

## 2024-02-23 ENCOUNTER — Ambulatory Visit (INDEPENDENT_AMBULATORY_CARE_PROVIDER_SITE_OTHER): Admitting: Podiatry

## 2024-02-23 ENCOUNTER — Encounter: Payer: Self-pay | Admitting: Podiatry

## 2024-02-23 DIAGNOSIS — Q828 Other specified congenital malformations of skin: Secondary | ICD-10-CM | POA: Diagnosis not present

## 2024-02-23 NOTE — Progress Notes (Signed)
 Subjective:   Patient ID: [name], male   DOB: 68 y.o.   MRN: [id number]   HPI This with 3 separate calluses to left 1 right foot very painful with worsening of symptoms over the last month or 2   ROS      Objective:  Physical Exam  Neurovascular status intact inflammation with keratotic tissue formation subfifth metatarsal base bilateral first metatarsal left with lucent cores to the lesions     Assessment:  Severe porokeratosis bilateral     Plan:  Sharp debridement of lesions no iatrogenic bleeding reappoint routine care     Patient present

## 2024-03-04 ENCOUNTER — Other Ambulatory Visit: Payer: Self-pay | Admitting: Medical

## 2024-03-08 NOTE — Telephone Encounter (Signed)
 Rx refill sent to pharmacy.

## 2024-03-30 ENCOUNTER — Telehealth: Payer: Self-pay

## 2024-03-30 NOTE — Telephone Encounter (Signed)
 Called pt x2 to see if he still needed referral to neuro for neck pain but received no answer

## 2024-04-09 ENCOUNTER — Other Ambulatory Visit: Payer: Self-pay | Admitting: Medical

## 2024-04-10 NOTE — Telephone Encounter (Signed)
 Requesting: tramadol  50mg   Contract:01/31/24 UDS: 02/01/23 Last Visit: 02/04/24 Next Visit: None Last Refill: 03/08/24 #135 and 0RF   Please Advise

## 2024-04-10 NOTE — Telephone Encounter (Signed)
Rx refill sent to pt pharmacy 

## 2024-05-09 ENCOUNTER — Other Ambulatory Visit: Payer: Self-pay | Admitting: Medical

## 2024-05-09 NOTE — Telephone Encounter (Signed)
 Requesting: tramadol  50mg   Contract: 01/31/24 UDS: 02/01/23 Last Visit: 02/04/24 Next Visit: None Last Refill: 04/10/24 #135 and 9mq   Please Advise

## 2024-05-10 ENCOUNTER — Telehealth: Payer: Self-pay

## 2024-05-10 NOTE — Telephone Encounter (Signed)
 Pt called this is the second attempt with no answer and can't leave vm to notify him that he needs an appointment for surg clearance

## 2024-05-10 NOTE — Telephone Encounter (Signed)
 Received neuro surgery clearance papers for pt. Tried to call to get him scheduled with pcp but pt did not answer and vm box not set up . Will try to call again later

## 2024-05-11 NOTE — Telephone Encounter (Signed)
 I sent in refill tramadol . Added message to rx for pharmacy to advise pt to follow up in office. Call patient and try to get him scheduled for prep evaluation by 1st week of january

## 2024-05-11 NOTE — Telephone Encounter (Signed)
 Called pt again. This is the third attempt with no answer and no call back so far

## 2024-05-12 NOTE — Telephone Encounter (Signed)
 Called [facility] but was not able to reach anyone. Was on hold for 10 min before hanging up will try again later. Also called pt again and was not able to get in contact with him vm box still not set up

## 2024-05-15 ENCOUNTER — Ambulatory Visit: Admitting: Podiatry

## 2024-05-19 ENCOUNTER — Telehealth: Payer: Self-pay | Admitting: Medical

## 2024-05-19 NOTE — Telephone Encounter (Signed)
 Copied from CRM #[id number]. Topic: General - Call Back - No Documentation >> May 19, 2024 11:29 AM Rea BROCKS wrote: Reason for CRM: Social Security needs confirmation of patients disability for Medicare. Patient is currently on the road but he will call back to provide the phone number. He needs someone to contact Medicare and confirm his disability.    [phone] (M)

## 2024-05-22 NOTE — Telephone Encounter (Signed)
 Copied from CRM #[id number]. Topic: General - Call Back - No Documentation >> May 22, 2024  2:23 PM Amber H wrote: Patient stated [facility] needs proof from provider that he is disabled. He stated he has spoken with [facility] and they said they just need confirmation that he is disabled. I did advise patient that a note from 12/26 showed a form needed to be sent over from the insurance company. He said that his insurance did not mention anything about a form and all his provider has to do is send something over showing his dxs.   [phone] [facility]- phone number [phone]

## 2024-05-22 NOTE — Telephone Encounter (Unsigned)
 Copied from CRM #[id number]. Topic: General - Call Back - No Documentation >> May 22, 2024  2:23 PM Amber H wrote: Patient stated [facility] needs proof from provider that he is disabled. He stated he has spoken with [facility] and they said they just need confirmation that he is disabled. I did advise patient that a note from 12/26 showed a form needed to be sent over from the insurance company. He said that his insurance did not mention anything about a form and all his provider has to do is send something over showing his dxs.   [phone] [facility]- phone number [phone]

## 2024-05-24 ENCOUNTER — Encounter: Payer: Self-pay | Admitting: Podiatry

## 2024-05-24 ENCOUNTER — Ambulatory Visit (INDEPENDENT_AMBULATORY_CARE_PROVIDER_SITE_OTHER): Admitting: Podiatry

## 2024-05-24 DIAGNOSIS — M79675 Pain in left toe(s): Secondary | ICD-10-CM

## 2024-05-24 DIAGNOSIS — Q828 Other specified congenital malformations of skin: Secondary | ICD-10-CM

## 2024-05-24 DIAGNOSIS — M79674 Pain in right toe(s): Secondary | ICD-10-CM

## 2024-05-24 DIAGNOSIS — B351 Tinea unguium: Secondary | ICD-10-CM

## 2024-05-24 NOTE — Telephone Encounter (Signed)
 Called pt and got him scheduled for Monday 05/29/2024

## 2024-05-24 NOTE — Progress Notes (Signed)
 Subjective:   Patient ID: [name], male   DOB: 68 y.o.   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

## 2024-05-29 ENCOUNTER — Telehealth: Payer: Self-pay | Admitting: Medical

## 2024-05-29 ENCOUNTER — Encounter: Payer: Self-pay | Admitting: Medical

## 2024-05-29 ENCOUNTER — Ambulatory Visit: Admitting: Medical

## 2024-05-29 VITALS — BP 112/80 | HR 62 | Temp 98.0°F | Resp 15 | Ht 67.0 in | Wt 119.4 lb

## 2024-05-29 DIAGNOSIS — J069 Acute upper respiratory infection, unspecified: Secondary | ICD-10-CM

## 2024-05-29 DIAGNOSIS — M25512 Pain in left shoulder: Secondary | ICD-10-CM

## 2024-05-29 DIAGNOSIS — M5412 Radiculopathy, cervical region: Secondary | ICD-10-CM

## 2024-05-29 DIAGNOSIS — G8929 Other chronic pain: Secondary | ICD-10-CM | POA: Diagnosis not present

## 2024-05-29 DIAGNOSIS — Q039 Congenital hydrocephalus, unspecified: Secondary | ICD-10-CM

## 2024-05-29 DIAGNOSIS — R0981 Nasal congestion: Secondary | ICD-10-CM | POA: Diagnosis not present

## 2024-05-29 DIAGNOSIS — M25511 Pain in right shoulder: Secondary | ICD-10-CM

## 2024-05-29 MED ORDER — FLUTICASONE PROPIONATE 50 MCG/ACT NA SUSP: 2.0000 | Freq: Every day | NASAL | 1 refills | Status: AC

## 2024-05-29 MED ORDER — AZITHROMYCIN 250 MG PO TABS: ORAL_TABLET | ORAL | 0 refills | Status: AC

## 2024-05-29 NOTE — Telephone Encounter (Signed)
 Need help calling pt insurance to get clarification on there request for me to write letter attesting to his disability. Talk with me about this. I can give you the number to call for clarification.

## 2024-05-29 NOTE — Patient Instructions (Addendum)
 Acute upper respiratory infection with nasal congestion Nasal congestion likely due to a cold. No productive cough. No antibiotics immediately. - Prescribed Flonase  nasal spray for morning use. - Advised nasal irrigation with saline spray at night. - Consider azithromycin  if symptoms persist by Friday. Rx sent but not to start.  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 Surgery recommended but declined. Disability status crucial for benefits. Prior disability since 2013. Recieved before became pt of our office. - Contact [employer] to clarify disability documentation requirements. - Provide documentation of disability status if needed. May need you to drop of letter explaining  request for proof.  Chronic bilateral shoulder pain Chronic pain with decreased grip strength and limited range of motion. Ongoing tramadol  use for pain management.  Chronic constipation Intermittent severe constipation affecting work. History of small bowel obstruction due to scar tissue. Congenital abnormality.  Follow up date to be determined depending on how you do with treatment and disability proof requirements.

## 2024-05-29 NOTE — Progress Notes (Signed)
 "  Subjective:    Patient ID: [name], male    DOB: Jul 06, 1955, 69 y.o.   MRN: [id number]  HPI  [name] is a 69 year old male with congenital spine deformities who presents for disability documentation and management of chronic pain.  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 He has declined recommended spine surgery. He has chronic shoulder pain with numbness radiating to his arms and hands and prior spinal stenosis at C1. He manages pain with tramadol  and works about 16 hours per week in a physically limited role. He seeks documentation to maintain disability related to pain and functional impairment.  He had a small bowel obstruction surgically treated in 2013, with later episodes attributed to adhesions. He also has intermittent severe constipation that at times limits his ability to work.  Pt explain he was originally determined disabled back in 2013 per his report. I don't remember ever talking about his disability though it does make sense that was given in past. He became pt of our practice in 2015.  Pt states his medical insurance need letter stating he is disable. Apparently he get some benefits thru insurance if disabled and discount per pt report.  For the past five days he has had cold symptoms with nasal congestion and mucus discharge without sinus pressure or significant cough. He uses guaifenesin  for relief. No shortness of breath and no wheezing. No fever, no chills or sweats.           [employer] card numbers. Proof of disability. [phone]    Review of Systems  Constitutional:  Negative for chills, fatigue and fever.  HENT:  Positive for congestion. Negative for ear pain, postnasal drip, sinus pressure and sinus pain.   Respiratory:  Negative for cough, chest tightness and wheezing.   Cardiovascular:  Negative for  chest pain and palpitations.  Gastrointestinal:  Negative for abdominal pain, blood in stool, nausea and vomiting.  Genitourinary:  Negative for flank pain and frequency.  Musculoskeletal:  Positive for neck pain and neck stiffness. Negative for back pain and myalgias.       Shoulder pain  Skin:  Negative for rash.  Neurological:  Negative for dizziness, seizures and headaches.  Hematological:  Negative for adenopathy.  Psychiatric/Behavioral:  Negative for behavioral problems and dysphoric mood.     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   Pneumonia    childhood      Social History   Socioeconomic History   Marital status: Single    Spouse name: Not on file   Number of children: 0   Years of education: Not on file   Highest education level: Not on file  Occupational History   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  Tobacco Use   Smoking status: Never   Smokeless tobacco: Never  Vaping Use   Vaping status: Never Used  Substance and Sexual Activity   Alcohol use: No    Alcohol/week: 0.0 standard  drinks of alcohol   Drug use: No   Sexual activity: Yes  Other Topics Concern   Not on file  Social History Narrative   Lives w/ mother , has a younger brother and sister    Social Drivers of Health   Tobacco Use: Low Risk (05/24/2024)   Patient History    Smoking Tobacco Use: Never    Smokeless Tobacco Use: Never    Passive Exposure: Not on file  Financial Resource Strain: Low Risk (09/30/2021)   Overall Financial Resource Strain (CARDIA)    Difficulty of Paying Living Expenses: Not hard at all  Food Insecurity: No  Food Insecurity (09/30/2021)   Hunger Vital Sign    Worried About Running Out of Food in the Last Year: Never true    Ran Out of Food in the Last Year: Never true  Transportation Needs: No Transportation Needs (09/30/2021)   PRAPARE - Administrator, Civil Service (Medical): No    Lack of Transportation (Non-Medical): No  Physical Activity: Sufficiently Active (09/30/2021)   Exercise Vital Sign    Days of Exercise per Week: 7 days    Minutes of Exercise per Session: 60 min  Stress: No Stress Concern Present (09/30/2021)   [employer] of Occupational Health - Occupational Stress Questionnaire    Feeling of Stress : Not at all  Social Connections: Socially Integrated (09/30/2021)   Social Connection and Isolation Panel    Frequency of Communication with Friends and Family: More than three times a week    Frequency of Social Gatherings with Friends and Family: More than three times a week    Attends Religious Services: More than 4 times per year    Active Member of Clubs or Organizations: Yes    Attends Banker Meetings: More than 4 times per year    Marital Status: Married  Catering Manager Violence: Not At Risk (09/30/2021)   Humiliation, Afraid, Rape, and Kick questionnaire    Fear of Current or Ex-Partner: No    Emotionally Abused: No    Physically Abused: No    Sexually Abused: No  Depression (PHQ2-9): Low Risk (01/18/2024)   Depression (PHQ2-9)    PHQ-2 Score: 0  Alcohol Screen: Low Risk (09/30/2021)   Alcohol Screen    Last Alcohol Screening Score (AUDIT): 0  Housing: Low Risk (09/30/2021)   Housing    Last Housing Risk Score: 0  Utilities: Not on file  Health Literacy: Not on file    Past Surgical History:  Procedure Laterality Date   COLONOSCOPY  11/08/2006   HARDWARE REMOVAL Right 10/23/2015   Procedure: RIGHT KNEE HARDWARE REMOVAL;  Surgeon: Dempsey Moan, MD;  Location: [facility];  Service: Orthopedics;  Laterality: Right;  LMA   HERNIA REPAIR  1980    left inguinal hernia   LAPAROTOMY  07/18/2011   Procedure: EXPLORATORY LAPAROTOMY;  Surgeon: Morene ONEIDA Olives, MD;  Location: [facility];  Service: General;  Laterality: N/A;  lysis of adhesions entero enterostomy   LAPAROTOMY  07/28/2011   Procedure: EXPLORATORY LAPAROTOMY;  Surgeon: Morene ONEIDA Olives, MD;  Location: [facility];  Service: General;  Laterality: N/A;   Left Knee surgery  1994, 1992   ORIF PATELLA Right 12/14/2014   Procedure: OPEN REDUCTION INTERNAL (ORIF) FIXATION PATELLA;  Surgeon: Dempsey Moan, MD;  Location: [facility];  Service: Orthopedics;  Laterality: Right;   Surgery for imperforate anus  1958   TONSILLECTOMY     UPPER GASTROINTESTINAL ENDOSCOPY  05/15/03  Family History  Problem Relation Age of Onset   Alzheimer's disease Father    Diabetes Mother    Cancer Mother    Colon cancer Neg Hx    Prostate cancer Neg Hx     Allergies[1]  Medications Ordered Prior to Encounter[2]  BP 112/80   Pulse 62   Temp 98 F (36.7 C) (Oral)   Resp 15   Ht 5' 7 (1.702 m)   Wt 119 lb 6.4 oz (54.2 kg)   SpO2 99%   BMI 18.70 kg/m        Objective:   Physical Exam  General- No acute distress. Pleasant patient. Neck- Full range of motion, no jvd Lungs- Clear, even and unlabored. Heart- regular rate and rhythm. Neurologic- CNII- XII grossly intact.  Heent- nasal congestion. No sinus pressure presently. Ears canals clear and tm normal.      Assessment & Plan:   Patient Instructions  Acute upper respiratory infection with nasal congestion Nasal congestion likely due to a cold. No productive cough. No antibiotics immediately. - Prescribed Flonase  nasal spray for morning use. - Advised nasal irrigation with saline spray at night. - Consider azithromycin  if symptoms persist by Friday. Rx sent but not to start.  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 Surgery recommended but  declined. Disability status crucial for benefits. Prior disability since 2013. Recieved before became pt of our office. - Contact [employer] to clarify disability documentation requirements. - Provide documentation of disability status if needed. May need you to drop of letter explaining     request for proof.  Chronic bilateral shoulder pain Chronic pain with decreased grip strength and limited range of motion. Ongoing tramadol  use for pain management.  Chronic constipation Intermittent severe constipation affecting work. History of small bowel obstruction due to scar tissue. Congenital abnormality.  Follow up date to be determined depending on how you do with treatment and disability proof requirements.    Mallerie Blok, PA-C     [1] No Known Allergies [2]  Current Outpatient Medications on File Prior to Visit  Medication Sig Dispense Refill   traMADol  (ULTRAM ) 50 MG tablet TAKE 1 TO 2 TABLETS BY MOUTH EVERY 6 HOURS AS NEEDED FOR SEVERE PAIN **MAX OF 4 AND A HALF TABLETS PER DAY** 135 tablet 0   No current facility-administered medications on file prior to visit.   "

## 2024-06-04 ENCOUNTER — Telehealth: Payer: Self-pay | Admitting: Medical

## 2024-06-04 NOTE — Telephone Encounter (Signed)
 I have preop clearance form for pt neck surgery. But not filling out as patient update me he is declining surgery presently.

## 2024-06-05 NOTE — Telephone Encounter (Signed)
 Tried to call pt with no answer can't leave vm will try and call [facility]

## 2024-06-05 NOTE — Telephone Encounter (Signed)
 Called [facility] and they gave me a number to fax the letter to. She asked me what the pt needed this for and I advised her he said that he just needed it for insurance purposes as far as I know. She was not sure if the fax number she gave me is correct for what he needed and said she can not see much in his chart but did give me the chronic conditions fax number [phone] faxed with confirmation recieved

## 2024-06-13 ENCOUNTER — Other Ambulatory Visit: Payer: Self-pay | Admitting: Medical

## 2024-06-13 NOTE — Telephone Encounter (Signed)
 Requesting: tramadol  50mg   Contract: UDS: Last Visit: 05/29/24 Next Visit: NonE Last Refill: 05/11/24 #135 and 0RF   Did not complete labs from 01/18/24 visit  Please Advise

## 2024-06-14 NOTE — Telephone Encounter (Signed)
 Rx refill of tramadol  sent to pharmacy based on your information that he is up to date on contract, uds and seen within past 6 months.

## 2024-06-15 NOTE — Telephone Encounter (Signed)
 Looked in chart and contract is indeed up to date but looks like uds was not completed back in August. Tried to call pt but no answer and vm not set up. Will call again and if no answer will send letter

## 2024-06-15 NOTE — Telephone Encounter (Signed)
 Went ahead and sent letter informing patient

## 2024-06-16 ENCOUNTER — Ambulatory Visit (INDEPENDENT_AMBULATORY_CARE_PROVIDER_SITE_OTHER): Admitting: Medical

## 2024-06-16 ENCOUNTER — Encounter: Payer: Self-pay | Admitting: Medical

## 2024-06-16 VITALS — BP 110/65 | HR 68 | Temp 97.9°F | Resp 15 | Ht 67.0 in | Wt 115.4 lb

## 2024-06-16 DIAGNOSIS — M542 Cervicalgia: Secondary | ICD-10-CM

## 2024-06-16 DIAGNOSIS — M5412 Radiculopathy, cervical region: Secondary | ICD-10-CM | POA: Diagnosis not present

## 2024-06-16 DIAGNOSIS — M25512 Pain in left shoulder: Secondary | ICD-10-CM

## 2024-06-16 DIAGNOSIS — G8929 Other chronic pain: Secondary | ICD-10-CM | POA: Diagnosis not present

## 2024-06-16 DIAGNOSIS — Z79899 Other long term (current) drug therapy: Secondary | ICD-10-CM | POA: Diagnosis not present

## 2024-06-16 DIAGNOSIS — M25511 Pain in right shoulder: Secondary | ICD-10-CM

## 2024-06-16 NOTE — Progress Notes (Signed)
" ° °  Subjective:    Patient ID: [name], male    DOB: [birth year], 69 y.o.   MRN: [id number]  HPI  [name] is a 69 year old male with chronic cervical radicular pain who presents for a control medication visit.  He has chronic cervical radicular pain due to degenerative cervical disease and uses tramadol  with good tolerance, without over-sedation or constipation despite his gastrointestinal conditions. A neurosurgeon diagnosed cervical myelopathy and radiculopathy and recommended surgery. He notes his blood pressure runs low at about 110/65 mmHg, which concerns him as scary lows.    Review of Systems See hpi    Objective:   Physical Exam  General- No acute distress. Pleasant patient. Neck-  no jvd Lungs- Clear, even and unlabored. Heart- regular rate and rhythm. Neurologic- CNII- XII grossly intact. Upper ext strength 5/5.       Assessment & Plan:   Patient Instructions  Chronic cervical radiculopathy with neck and shoulder pain Chronic cervical radiculopathy managed with tramadol . Surgery advised by neurosurgeon but deferred/declined by pt. - Continue tramadol  for pain management.(Recent rx already sent) - Schedule control medication visit every six months. UDS and controlled med contract is update  Blood pressure stable at 110/65 mmHg. - Continue current tramadol  regimen.  Follow up 6 months or sooner if needed    [employer], PA-C  "

## 2024-06-16 NOTE — Patient Instructions (Addendum)
 Chronic cervical radiculopathy with neck and shoulder pain Chronic cervical radiculopathy managed with tramadol . Surgery advised by neurosurgeon but deferred/declined by pt. - Continue tramadol  for pain management.(Recent rx already sent) - Schedule control medication visit every six months. UDS and controlled med contract is update  Blood pressure stable at 110/65 mmHg. - Continue current tramadol  regimen.  Follow up 6 months or sooner if needed
